# Patient Record
Sex: Female | Born: 1960 | Race: Black or African American | Hispanic: No | Smoking: Former smoker
Health system: Southern US, Community
[De-identification: ages and names within clinical notes are randomized; demographics above are authoritative.]

## PROBLEM LIST (undated history)

## (undated) DIAGNOSIS — C801 Malignant (primary) neoplasm, unspecified: Secondary | ICD-10-CM

## (undated) DIAGNOSIS — E785 Hyperlipidemia, unspecified: Secondary | ICD-10-CM

## (undated) DIAGNOSIS — G589 Mononeuropathy, unspecified: Secondary | ICD-10-CM

## (undated) DIAGNOSIS — F32A Depression, unspecified: Secondary | ICD-10-CM

## (undated) DIAGNOSIS — K5900 Constipation, unspecified: Secondary | ICD-10-CM

## (undated) DIAGNOSIS — F329 Major depressive disorder, single episode, unspecified: Secondary | ICD-10-CM

## (undated) DIAGNOSIS — M542 Cervicalgia: Secondary | ICD-10-CM

## (undated) DIAGNOSIS — M1990 Unspecified osteoarthritis, unspecified site: Secondary | ICD-10-CM

## (undated) DIAGNOSIS — I10 Essential (primary) hypertension: Secondary | ICD-10-CM

## (undated) DIAGNOSIS — K219 Gastro-esophageal reflux disease without esophagitis: Secondary | ICD-10-CM

## (undated) DIAGNOSIS — J329 Chronic sinusitis, unspecified: Secondary | ICD-10-CM

## (undated) DIAGNOSIS — N951 Menopausal and female climacteric states: Secondary | ICD-10-CM

## (undated) DIAGNOSIS — F419 Anxiety disorder, unspecified: Secondary | ICD-10-CM

## (undated) DIAGNOSIS — Z87442 Personal history of urinary calculi: Secondary | ICD-10-CM

## (undated) HISTORY — PX: REPAIR IMPERFORATE ANUS / ANORECTOPLASTY: SUR1185

## (undated) HISTORY — DX: Constipation, unspecified: K59.00

## (undated) HISTORY — PX: EYE SURGERY: SHX253

## (undated) HISTORY — DX: Malignant (primary) neoplasm, unspecified: C80.1

## (undated) HISTORY — PX: BREAST BIOPSY: SHX20

## (undated) HISTORY — DX: Essential (primary) hypertension: I10

## (undated) HISTORY — DX: Unspecified osteoarthritis, unspecified site: M19.90

## (undated) HISTORY — PX: ABDOMINAL EXPLORATION SURGERY: SHX538

## (undated) HISTORY — DX: Cervicalgia: M54.2

## (undated) HISTORY — DX: Gastro-esophageal reflux disease without esophagitis: K21.9

## (undated) HISTORY — DX: Hyperlipidemia, unspecified: E78.5

---
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
## 1989-11-07 HISTORY — PX: TUBAL LIGATION: SHX77

## 1991-11-08 DIAGNOSIS — C801 Malignant (primary) neoplasm, unspecified: Secondary | ICD-10-CM

## 1991-11-08 HISTORY — DX: Malignant (primary) neoplasm, unspecified: C80.1

## 2001-03-15 ENCOUNTER — Other Ambulatory Visit: Admission: RE | Admit: 2001-03-15 | Discharge: 2001-03-15 | Payer: Self-pay | Admitting: Family Medicine

## 2001-06-05 ENCOUNTER — Encounter: Payer: Self-pay | Admitting: *Deleted

## 2001-06-05 ENCOUNTER — Emergency Department (HOSPITAL_COMMUNITY): Admission: EM | Admit: 2001-06-05 | Discharge: 2001-06-05 | Payer: Self-pay | Admitting: *Deleted

## 2002-06-28 ENCOUNTER — Ambulatory Visit (HOSPITAL_COMMUNITY): Admission: RE | Admit: 2002-06-28 | Discharge: 2002-06-28 | Payer: Self-pay | Admitting: Family Medicine

## 2002-06-28 ENCOUNTER — Encounter: Payer: Self-pay | Admitting: Family Medicine

## 2002-11-07 HISTORY — PX: BREAST SURGERY: SHX581

## 2002-11-07 HISTORY — PX: BREAST CYST EXCISION: SHX579

## 2003-02-19 ENCOUNTER — Ambulatory Visit (HOSPITAL_COMMUNITY): Admission: RE | Admit: 2003-02-19 | Discharge: 2003-02-19 | Payer: Self-pay | Admitting: General Surgery

## 2003-04-23 ENCOUNTER — Ambulatory Visit (HOSPITAL_COMMUNITY): Admission: RE | Admit: 2003-04-23 | Discharge: 2003-04-23 | Payer: Self-pay | Admitting: Family Medicine

## 2003-04-23 ENCOUNTER — Encounter: Payer: Self-pay | Admitting: Family Medicine

## 2003-05-08 ENCOUNTER — Encounter: Payer: Self-pay | Admitting: Family Medicine

## 2003-05-08 ENCOUNTER — Ambulatory Visit (HOSPITAL_COMMUNITY): Admission: RE | Admit: 2003-05-08 | Discharge: 2003-05-08 | Payer: Self-pay | Admitting: Family Medicine

## 2003-05-23 ENCOUNTER — Ambulatory Visit (HOSPITAL_COMMUNITY): Admission: RE | Admit: 2003-05-23 | Discharge: 2003-05-23 | Payer: Self-pay | Admitting: General Surgery

## 2003-05-23 ENCOUNTER — Encounter: Payer: Self-pay | Admitting: General Surgery

## 2003-10-22 ENCOUNTER — Ambulatory Visit (HOSPITAL_COMMUNITY): Admission: RE | Admit: 2003-10-22 | Discharge: 2003-10-22 | Payer: Self-pay | Admitting: Family Medicine

## 2003-11-08 HISTORY — PX: OTHER SURGICAL HISTORY: SHX169

## 2004-01-01 ENCOUNTER — Ambulatory Visit (HOSPITAL_COMMUNITY): Admission: RE | Admit: 2004-01-01 | Discharge: 2004-01-01 | Payer: Self-pay | Admitting: Family Medicine

## 2004-05-06 ENCOUNTER — Ambulatory Visit (HOSPITAL_COMMUNITY): Admission: RE | Admit: 2004-05-06 | Discharge: 2004-05-06 | Payer: Self-pay | Admitting: Family Medicine

## 2004-07-08 ENCOUNTER — Emergency Department (HOSPITAL_COMMUNITY): Admission: EM | Admit: 2004-07-08 | Discharge: 2004-07-09 | Payer: Self-pay | Admitting: *Deleted

## 2004-12-15 ENCOUNTER — Ambulatory Visit: Payer: Self-pay | Admitting: Family Medicine

## 2005-03-16 ENCOUNTER — Ambulatory Visit: Payer: Self-pay | Admitting: Family Medicine

## 2005-04-15 ENCOUNTER — Ambulatory Visit: Payer: Self-pay | Admitting: Family Medicine

## 2005-05-26 ENCOUNTER — Emergency Department (HOSPITAL_COMMUNITY): Admission: EM | Admit: 2005-05-26 | Discharge: 2005-05-26 | Payer: Self-pay | Admitting: Emergency Medicine

## 2005-05-26 ENCOUNTER — Ambulatory Visit: Payer: Self-pay | Admitting: Family Medicine

## 2005-06-06 ENCOUNTER — Encounter (INDEPENDENT_AMBULATORY_CARE_PROVIDER_SITE_OTHER): Payer: Self-pay | Admitting: *Deleted

## 2005-06-06 ENCOUNTER — Ambulatory Visit: Payer: Self-pay | Admitting: Family Medicine

## 2005-06-06 LAB — CONVERTED CEMR LAB: Pap Smear: NORMAL

## 2005-06-15 ENCOUNTER — Ambulatory Visit (HOSPITAL_COMMUNITY): Admission: RE | Admit: 2005-06-15 | Discharge: 2005-06-15 | Payer: Self-pay | Admitting: Family Medicine

## 2005-06-27 ENCOUNTER — Ambulatory Visit: Payer: Self-pay | Admitting: Family Medicine

## 2005-06-27 ENCOUNTER — Encounter: Payer: Self-pay | Admitting: Orthopedic Surgery

## 2005-06-28 ENCOUNTER — Ambulatory Visit (HOSPITAL_COMMUNITY): Admission: RE | Admit: 2005-06-28 | Discharge: 2005-06-28 | Payer: Self-pay | Admitting: Family Medicine

## 2005-06-29 ENCOUNTER — Ambulatory Visit (HOSPITAL_COMMUNITY): Admission: RE | Admit: 2005-06-29 | Discharge: 2005-06-29 | Payer: Self-pay | Admitting: Family Medicine

## 2005-07-18 ENCOUNTER — Ambulatory Visit (HOSPITAL_COMMUNITY): Admission: RE | Admit: 2005-07-18 | Discharge: 2005-07-18 | Payer: Self-pay | Admitting: Family Medicine

## 2005-07-25 ENCOUNTER — Ambulatory Visit: Payer: Self-pay | Admitting: Family Medicine

## 2005-08-10 ENCOUNTER — Ambulatory Visit: Payer: Self-pay | Admitting: Family Medicine

## 2005-11-07 HISTORY — PX: ABDOMINAL HYSTERECTOMY: SHX81

## 2005-11-07 HISTORY — PX: TOTAL ABDOMINAL HYSTERECTOMY W/ BILATERAL SALPINGOOPHORECTOMY: SHX83

## 2005-12-14 ENCOUNTER — Ambulatory Visit: Payer: Self-pay | Admitting: Family Medicine

## 2006-01-17 ENCOUNTER — Emergency Department (HOSPITAL_COMMUNITY): Admission: EM | Admit: 2006-01-17 | Discharge: 2006-01-17 | Payer: Self-pay | Admitting: Emergency Medicine

## 2006-01-19 ENCOUNTER — Ambulatory Visit: Payer: Self-pay | Admitting: Family Medicine

## 2006-02-09 ENCOUNTER — Ambulatory Visit: Payer: Self-pay | Admitting: Family Medicine

## 2006-02-10 ENCOUNTER — Ambulatory Visit (HOSPITAL_COMMUNITY): Admission: RE | Admit: 2006-02-10 | Discharge: 2006-02-10 | Payer: Self-pay | Admitting: Family Medicine

## 2006-03-08 ENCOUNTER — Ambulatory Visit (HOSPITAL_COMMUNITY): Admission: RE | Admit: 2006-03-08 | Discharge: 2006-03-08 | Payer: Self-pay | Admitting: Family Medicine

## 2006-03-22 ENCOUNTER — Ambulatory Visit: Payer: Self-pay | Admitting: Family Medicine

## 2006-03-24 ENCOUNTER — Ambulatory Visit (HOSPITAL_COMMUNITY): Admission: RE | Admit: 2006-03-24 | Discharge: 2006-03-24 | Payer: Self-pay | Admitting: Family Medicine

## 2006-05-24 ENCOUNTER — Encounter (INDEPENDENT_AMBULATORY_CARE_PROVIDER_SITE_OTHER): Payer: Self-pay | Admitting: Specialist

## 2006-05-24 ENCOUNTER — Inpatient Hospital Stay (HOSPITAL_COMMUNITY): Admission: RE | Admit: 2006-05-24 | Discharge: 2006-05-27 | Payer: Self-pay | Admitting: Obstetrics & Gynecology

## 2006-06-06 ENCOUNTER — Ambulatory Visit: Payer: Self-pay | Admitting: Family Medicine

## 2006-07-31 ENCOUNTER — Emergency Department (HOSPITAL_COMMUNITY): Admission: EM | Admit: 2006-07-31 | Discharge: 2006-07-31 | Payer: Self-pay | Admitting: Emergency Medicine

## 2006-09-05 ENCOUNTER — Ambulatory Visit: Payer: Self-pay | Admitting: Family Medicine

## 2007-01-01 ENCOUNTER — Ambulatory Visit: Payer: Self-pay | Admitting: Family Medicine

## 2007-02-21 ENCOUNTER — Emergency Department (HOSPITAL_COMMUNITY): Admission: EM | Admit: 2007-02-21 | Discharge: 2007-02-21 | Payer: Self-pay | Admitting: Emergency Medicine

## 2007-09-01 ENCOUNTER — Emergency Department (HOSPITAL_COMMUNITY): Admission: EM | Admit: 2007-09-01 | Discharge: 2007-09-01 | Payer: Self-pay | Admitting: Emergency Medicine

## 2007-09-07 ENCOUNTER — Ambulatory Visit (HOSPITAL_COMMUNITY): Admission: RE | Admit: 2007-09-07 | Discharge: 2007-09-07 | Payer: Self-pay | Admitting: Family Medicine

## 2007-12-17 ENCOUNTER — Emergency Department (HOSPITAL_COMMUNITY): Admission: EM | Admit: 2007-12-17 | Discharge: 2007-12-17 | Payer: Self-pay | Admitting: Emergency Medicine

## 2008-01-08 ENCOUNTER — Ambulatory Visit: Payer: Self-pay | Admitting: Family Medicine

## 2008-01-29 ENCOUNTER — Encounter (INDEPENDENT_AMBULATORY_CARE_PROVIDER_SITE_OTHER): Payer: Self-pay | Admitting: *Deleted

## 2008-01-29 DIAGNOSIS — I10 Essential (primary) hypertension: Secondary | ICD-10-CM | POA: Insufficient documentation

## 2008-01-29 DIAGNOSIS — K219 Gastro-esophageal reflux disease without esophagitis: Secondary | ICD-10-CM

## 2008-01-29 DIAGNOSIS — H409 Unspecified glaucoma: Secondary | ICD-10-CM | POA: Insufficient documentation

## 2008-01-29 DIAGNOSIS — E785 Hyperlipidemia, unspecified: Secondary | ICD-10-CM | POA: Insufficient documentation

## 2008-02-11 ENCOUNTER — Encounter: Payer: Self-pay | Admitting: Family Medicine

## 2008-02-11 LAB — CONVERTED CEMR LAB
ALT: 12 units/L (ref 0–35)
AST: 14 units/L (ref 0–37)
Albumin: 4.8 g/dL (ref 3.5–5.2)
BUN: 13 mg/dL (ref 6–23)
Chloride: 102 meq/L (ref 96–112)
Creatinine, Ser: 0.8 mg/dL (ref 0.40–1.20)
Glucose, Bld: 78 mg/dL (ref 70–99)
HDL: 87 mg/dL (ref >39)
Potassium: 4.1 meq/L (ref 3.5–5.3)
Total CHOL/HDL Ratio: 2.3
Total Protein: 8.4 g/dL — ABNORMAL HIGH (ref 6.0–8.3)

## 2008-02-12 ENCOUNTER — Ambulatory Visit: Payer: Self-pay | Admitting: Family Medicine

## 2008-02-21 ENCOUNTER — Encounter: Payer: Self-pay | Admitting: Orthopedic Surgery

## 2008-02-22 ENCOUNTER — Encounter (INDEPENDENT_AMBULATORY_CARE_PROVIDER_SITE_OTHER): Payer: Self-pay | Admitting: *Deleted

## 2008-03-11 ENCOUNTER — Encounter: Payer: Self-pay | Admitting: Orthopedic Surgery

## 2008-03-17 ENCOUNTER — Ambulatory Visit: Payer: Self-pay | Admitting: Orthopedic Surgery

## 2008-03-17 DIAGNOSIS — S139XXA Sprain of joints and ligaments of unspecified parts of neck, initial encounter: Secondary | ICD-10-CM | POA: Insufficient documentation

## 2008-03-17 DIAGNOSIS — M542 Cervicalgia: Secondary | ICD-10-CM | POA: Insufficient documentation

## 2008-03-18 ENCOUNTER — Telehealth: Payer: Self-pay | Admitting: Orthopedic Surgery

## 2008-03-20 ENCOUNTER — Ambulatory Visit (HOSPITAL_COMMUNITY): Admission: RE | Admit: 2008-03-20 | Discharge: 2008-03-20 | Payer: Self-pay | Admitting: Orthopedic Surgery

## 2008-03-26 ENCOUNTER — Ambulatory Visit: Payer: Self-pay | Admitting: Orthopedic Surgery

## 2008-04-01 ENCOUNTER — Encounter (HOSPITAL_COMMUNITY): Admission: RE | Admit: 2008-04-01 | Discharge: 2008-05-01 | Payer: Self-pay | Admitting: Orthopedic Surgery

## 2008-04-02 ENCOUNTER — Telehealth: Payer: Self-pay | Admitting: Orthopedic Surgery

## 2008-04-02 ENCOUNTER — Encounter: Payer: Self-pay | Admitting: Orthopedic Surgery

## 2008-04-05 ENCOUNTER — Emergency Department (HOSPITAL_COMMUNITY): Admission: EM | Admit: 2008-04-05 | Discharge: 2008-04-05 | Payer: Self-pay | Admitting: Emergency Medicine

## 2008-04-07 ENCOUNTER — Encounter: Admission: RE | Admit: 2008-04-07 | Discharge: 2008-04-07 | Payer: Self-pay | Admitting: Orthopedic Surgery

## 2008-04-21 ENCOUNTER — Encounter: Payer: Self-pay | Admitting: Orthopedic Surgery

## 2008-04-23 ENCOUNTER — Encounter: Admission: RE | Admit: 2008-04-23 | Discharge: 2008-04-23 | Payer: Self-pay | Admitting: Orthopedic Surgery

## 2008-05-05 ENCOUNTER — Encounter (HOSPITAL_COMMUNITY): Admission: RE | Admit: 2008-05-05 | Discharge: 2008-06-04 | Payer: Self-pay | Admitting: Orthopedic Surgery

## 2008-05-07 ENCOUNTER — Ambulatory Visit: Payer: Self-pay | Admitting: Orthopedic Surgery

## 2008-05-07 DIAGNOSIS — M542 Cervicalgia: Secondary | ICD-10-CM

## 2008-05-07 HISTORY — DX: Cervicalgia: M54.2

## 2008-05-13 ENCOUNTER — Encounter: Payer: Self-pay | Admitting: Family Medicine

## 2008-05-13 ENCOUNTER — Ambulatory Visit: Payer: Self-pay | Admitting: Family Medicine

## 2008-05-13 DIAGNOSIS — M25559 Pain in unspecified hip: Secondary | ICD-10-CM

## 2008-05-14 DIAGNOSIS — J309 Allergic rhinitis, unspecified: Secondary | ICD-10-CM | POA: Insufficient documentation

## 2008-06-18 ENCOUNTER — Ambulatory Visit: Payer: Self-pay | Admitting: Orthopedic Surgery

## 2008-06-18 DIAGNOSIS — R599 Enlarged lymph nodes, unspecified: Secondary | ICD-10-CM | POA: Insufficient documentation

## 2008-06-24 ENCOUNTER — Encounter: Payer: Self-pay | Admitting: Orthopedic Surgery

## 2008-06-26 ENCOUNTER — Ambulatory Visit (HOSPITAL_COMMUNITY): Admission: RE | Admit: 2008-06-26 | Discharge: 2008-06-26 | Payer: Self-pay | Admitting: Orthopedic Surgery

## 2008-07-04 ENCOUNTER — Encounter: Payer: Self-pay | Admitting: Orthopedic Surgery

## 2008-07-09 ENCOUNTER — Ambulatory Visit: Payer: Self-pay | Admitting: Orthopedic Surgery

## 2008-07-10 ENCOUNTER — Encounter: Payer: Self-pay | Admitting: Family Medicine

## 2008-07-10 LAB — CONVERTED CEMR LAB
BUN: 13 mg/dL (ref 6–23)
Basophils Absolute: 0 10*3/uL (ref 0.0–0.1)
Basophils Relative: 0 % (ref 0–1)
CO2: 30 meq/L (ref 19–32)
Calcium: 9.6 mg/dL (ref 8.4–10.5)
Creatinine, Ser: 0.84 mg/dL (ref 0.40–1.20)
Eosinophils Relative: 1 % (ref 0–5)
HCT: 42.9 % (ref 36.0–46.0)
HDL: 93 mg/dL (ref >39)
Hemoglobin: 14.7 g/dL (ref 12.0–15.0)
MCHC: 34.3 g/dL (ref 30.0–36.0)
Monocytes Absolute: 0.8 10*3/uL (ref 0.1–1.0)
RDW: 13.2 % (ref 11.5–15.5)
TSH: 1.773 microintl units/mL (ref 0.350–4.50)
Triglycerides: 116 mg/dL (ref <150)

## 2008-07-21 ENCOUNTER — Encounter: Admission: RE | Admit: 2008-07-21 | Discharge: 2008-07-21 | Payer: Self-pay | Admitting: Orthopedic Surgery

## 2008-07-24 ENCOUNTER — Other Ambulatory Visit: Admission: RE | Admit: 2008-07-24 | Discharge: 2008-07-24 | Payer: Self-pay | Admitting: Family Medicine

## 2008-07-24 ENCOUNTER — Ambulatory Visit: Payer: Self-pay | Admitting: Family Medicine

## 2008-07-24 DIAGNOSIS — E049 Nontoxic goiter, unspecified: Secondary | ICD-10-CM | POA: Insufficient documentation

## 2008-07-24 LAB — CONVERTED CEMR LAB: Pap Smear: NORMAL

## 2008-07-29 ENCOUNTER — Ambulatory Visit (HOSPITAL_COMMUNITY): Admission: RE | Admit: 2008-07-29 | Discharge: 2008-07-29 | Payer: Self-pay | Admitting: Family Medicine

## 2008-08-01 ENCOUNTER — Encounter: Payer: Self-pay | Admitting: Family Medicine

## 2008-08-07 ENCOUNTER — Ambulatory Visit: Payer: Self-pay | Admitting: Family Medicine

## 2008-08-19 ENCOUNTER — Telehealth: Payer: Self-pay | Admitting: Family Medicine

## 2008-09-09 ENCOUNTER — Ambulatory Visit (HOSPITAL_COMMUNITY): Admission: RE | Admit: 2008-09-09 | Discharge: 2008-09-09 | Payer: Self-pay | Admitting: Family Medicine

## 2008-09-26 ENCOUNTER — Ambulatory Visit: Payer: Self-pay | Admitting: Family Medicine

## 2008-09-26 DIAGNOSIS — R51 Headache: Secondary | ICD-10-CM

## 2008-09-26 DIAGNOSIS — R519 Headache, unspecified: Secondary | ICD-10-CM | POA: Insufficient documentation

## 2008-09-26 DIAGNOSIS — R42 Dizziness and giddiness: Secondary | ICD-10-CM | POA: Insufficient documentation

## 2008-11-27 ENCOUNTER — Ambulatory Visit: Payer: Self-pay | Admitting: Family Medicine

## 2008-12-02 ENCOUNTER — Encounter: Payer: Self-pay | Admitting: Family Medicine

## 2008-12-02 LAB — CONVERTED CEMR LAB
ALT: 14 units/L (ref 0–35)
AST: 16 units/L (ref 0–37)
Albumin: 4.4 g/dL (ref 3.5–5.2)
HDL: 83 mg/dL (ref >39)
Total Bilirubin: 0.4 mg/dL (ref 0.3–1.2)
Total CHOL/HDL Ratio: 1.9
Total Protein: 7.9 g/dL (ref 6.0–8.3)
Triglycerides: 77 mg/dL (ref <150)

## 2009-01-09 ENCOUNTER — Telehealth: Payer: Self-pay | Admitting: Family Medicine

## 2009-03-09 ENCOUNTER — Ambulatory Visit: Payer: Self-pay | Admitting: Family Medicine

## 2009-03-09 DIAGNOSIS — M79609 Pain in unspecified limb: Secondary | ICD-10-CM

## 2009-04-14 ENCOUNTER — Ambulatory Visit (HOSPITAL_COMMUNITY): Admission: RE | Admit: 2009-04-14 | Discharge: 2009-04-14 | Payer: Self-pay | Admitting: Family Medicine

## 2009-06-03 ENCOUNTER — Encounter: Payer: Self-pay | Admitting: Family Medicine

## 2009-06-03 LAB — CONVERTED CEMR LAB
ALT: 20 units/L (ref 0–35)
AST: 20 units/L (ref 0–37)
Albumin: 4.2 g/dL (ref 3.5–5.2)
Alkaline Phosphatase: 54 units/L (ref 39–117)
CO2: 26 meq/L (ref 19–32)
Calcium: 9.1 mg/dL (ref 8.4–10.5)
Cholesterol: 167 mg/dL (ref 0–200)
HDL: 72 mg/dL (ref >39)
Sodium: 141 meq/L (ref 135–145)
Total CHOL/HDL Ratio: 2.3
Total Protein: 7.3 g/dL (ref 6.0–8.3)
Triglycerides: 72 mg/dL (ref <150)

## 2009-07-02 ENCOUNTER — Ambulatory Visit: Payer: Self-pay | Admitting: Family Medicine

## 2009-08-18 ENCOUNTER — Ambulatory Visit: Payer: Self-pay | Admitting: Family Medicine

## 2009-08-25 ENCOUNTER — Telehealth: Payer: Self-pay | Admitting: Family Medicine

## 2009-09-03 ENCOUNTER — Encounter: Payer: Self-pay | Admitting: Family Medicine

## 2009-09-03 LAB — CONVERTED CEMR LAB: OCCULT 1: NEGATIVE

## 2009-09-05 ENCOUNTER — Encounter: Payer: Self-pay | Admitting: Family Medicine

## 2009-09-06 ENCOUNTER — Encounter: Payer: Self-pay | Admitting: Family Medicine

## 2009-09-07 ENCOUNTER — Encounter: Payer: Self-pay | Admitting: Family Medicine

## 2009-09-07 LAB — CONVERTED CEMR LAB: OCCULT 1: NEGATIVE

## 2009-10-02 ENCOUNTER — Ambulatory Visit (HOSPITAL_COMMUNITY): Admission: RE | Admit: 2009-10-02 | Discharge: 2009-10-02 | Payer: Self-pay | Admitting: Family Medicine

## 2009-10-05 ENCOUNTER — Ambulatory Visit: Payer: Self-pay | Admitting: Family Medicine

## 2009-10-14 ENCOUNTER — Telehealth: Payer: Self-pay | Admitting: Family Medicine

## 2009-11-25 ENCOUNTER — Telehealth: Payer: Self-pay | Admitting: Family Medicine

## 2009-12-15 LAB — CONVERTED CEMR LAB
ALT: 12 units/L (ref 0–35)
BUN: 12 mg/dL (ref 6–23)
Bilirubin, Direct: 0.1 mg/dL (ref 0.0–0.3)
Chloride: 104 meq/L (ref 96–112)
Glucose, Bld: 81 mg/dL (ref 70–99)
Indirect Bilirubin: 0.3 mg/dL (ref 0.0–0.9)
LDL Cholesterol: 75 mg/dL (ref 0–99)
Potassium: 4.1 meq/L (ref 3.5–5.3)
Sodium: 140 meq/L (ref 135–145)
Total CHOL/HDL Ratio: 2.5
Total Protein: 7.3 g/dL (ref 6.0–8.3)
VLDL: 20 mg/dL (ref 0–40)

## 2009-12-29 ENCOUNTER — Ambulatory Visit: Payer: Self-pay | Admitting: Family Medicine

## 2009-12-29 DIAGNOSIS — R5381 Other malaise: Secondary | ICD-10-CM

## 2009-12-29 DIAGNOSIS — R5383 Other fatigue: Secondary | ICD-10-CM

## 2009-12-29 DIAGNOSIS — M5442 Lumbago with sciatica, left side: Secondary | ICD-10-CM

## 2010-01-11 LAB — CONVERTED CEMR LAB
Basophils Relative: 0 % (ref 0–1)
Hemoglobin: 14.1 g/dL (ref 12.0–15.0)
Lymphs Abs: 2.2 10*3/uL (ref 0.7–4.0)
MCHC: 34.4 g/dL (ref 30.0–36.0)
MCV: 89.3 fL (ref 78.0–100.0)
Monocytes Absolute: 0.7 10*3/uL (ref 0.1–1.0)
Monocytes Relative: 8 % (ref 3–12)
Neutro Abs: 6.3 10*3/uL (ref 1.7–7.7)
Neutrophils Relative %: 67 % (ref 43–77)
RBC: 4.59 M/uL (ref 3.87–5.11)
TSH: 1.629 microintl units/mL (ref 0.350–4.500)
WBC: 9.4 10*3/uL (ref 4.0–10.5)

## 2010-01-12 ENCOUNTER — Telehealth: Payer: Self-pay | Admitting: Family Medicine

## 2010-02-03 ENCOUNTER — Ambulatory Visit: Payer: Self-pay | Admitting: Family Medicine

## 2010-02-04 ENCOUNTER — Telehealth: Payer: Self-pay | Admitting: Family Medicine

## 2010-02-09 ENCOUNTER — Ambulatory Visit (HOSPITAL_COMMUNITY): Admission: RE | Admit: 2010-02-09 | Discharge: 2010-02-09 | Payer: Self-pay | Admitting: Family Medicine

## 2010-06-08 ENCOUNTER — Ambulatory Visit: Payer: Self-pay | Admitting: Family Medicine

## 2010-06-08 DIAGNOSIS — L259 Unspecified contact dermatitis, unspecified cause: Secondary | ICD-10-CM | POA: Insufficient documentation

## 2010-06-16 ENCOUNTER — Telehealth: Payer: Self-pay | Admitting: Family Medicine

## 2010-06-18 ENCOUNTER — Telehealth: Payer: Self-pay | Admitting: Family Medicine

## 2010-07-27 ENCOUNTER — Other Ambulatory Visit: Admission: RE | Admit: 2010-07-27 | Discharge: 2010-07-27 | Payer: Self-pay | Admitting: Family Medicine

## 2010-07-27 ENCOUNTER — Ambulatory Visit: Payer: Self-pay | Admitting: Family Medicine

## 2010-07-27 DIAGNOSIS — N63 Unspecified lump in unspecified breast: Secondary | ICD-10-CM

## 2010-07-29 ENCOUNTER — Encounter: Payer: Self-pay | Admitting: Family Medicine

## 2010-07-30 ENCOUNTER — Encounter: Payer: Self-pay | Admitting: Family Medicine

## 2010-07-30 LAB — CONVERTED CEMR LAB: Pap Smear: NEGATIVE

## 2010-08-04 ENCOUNTER — Ambulatory Visit (HOSPITAL_COMMUNITY): Admission: RE | Admit: 2010-08-04 | Discharge: 2010-08-04 | Payer: Self-pay | Admitting: Family Medicine

## 2010-08-09 ENCOUNTER — Encounter: Payer: Self-pay | Admitting: Family Medicine

## 2010-08-09 LAB — CONVERTED CEMR LAB
ALT: 11 units/L (ref 0–35)
AST: 17 units/L (ref 0–37)
Albumin: 4.5 g/dL (ref 3.5–5.2)
Bilirubin, Direct: 0.1 mg/dL (ref 0.0–0.3)
Calcium: 9.5 mg/dL (ref 8.4–10.5)
Cholesterol: 170 mg/dL (ref 0–200)
HDL: 69 mg/dL (ref >39)
Potassium: 3.6 meq/L (ref 3.5–5.3)
Sodium: 141 meq/L (ref 135–145)
Total CHOL/HDL Ratio: 2.5
Total Protein: 7.3 g/dL (ref 6.0–8.3)
Triglycerides: 59 mg/dL (ref <150)
VLDL: 12 mg/dL (ref 0–40)

## 2010-09-27 ENCOUNTER — Ambulatory Visit: Payer: Self-pay | Admitting: Family Medicine

## 2010-10-04 ENCOUNTER — Ambulatory Visit (HOSPITAL_COMMUNITY): Admission: RE | Admit: 2010-10-04 | Payer: Self-pay | Admitting: Family Medicine

## 2010-11-26 ENCOUNTER — Telehealth: Payer: Self-pay | Admitting: Family Medicine

## 2010-11-27 ENCOUNTER — Encounter: Payer: Self-pay | Admitting: Family Medicine

## 2010-11-28 ENCOUNTER — Encounter: Payer: Self-pay | Admitting: Otolaryngology

## 2010-11-28 ENCOUNTER — Encounter: Payer: Self-pay | Admitting: Family Medicine

## 2010-11-29 ENCOUNTER — Encounter: Payer: Self-pay | Admitting: Orthopedic Surgery

## 2010-11-29 ENCOUNTER — Encounter: Payer: Self-pay | Admitting: Family Medicine

## 2010-12-07 NOTE — Assessment & Plan Note (Signed)
Summary: office visit   Vital Signs:  Patient profile:   50 year old female Menstrual status:  hysterectomy Height:      61.5 inches Weight:      127.50 pounds BMI:     23.79 O2 Sat:      94 % Pulse rate:   83 / minute Pulse rhythm:   regular Resp:     16 per minute BP sitting:   140 / 94  (left arm)  Vitals Entered By: Everitt Amber LPN (June 08, 2010 3:59 PM) CC: 2 days ago she noticed some bumps on her right arm.  Maybe 5 or so and they itch too. Nowhere else on her body. Also the inside of the right knee hurts her off and on   Primary Care Provider:  Syliva Overman MD  CC:  2 days ago she noticed some bumps on her right arm.  Maybe 5 or so and they itch too. Nowhere else on her body. Also the inside of the right knee hurts her off and on.  History of Present Illness: Reports  that she is doing fairly well. Denies sinus pressure, nasal congestion , ear pain or sore throat. Denies chest congestion, or cough productive of sputum. Denies chest pain, palpitations, PND, orthopnea or leg swelling. Denies abdominal pain, nausea, vomitting, diarrhea or constipation. Denies change in bowel movements or bloody stool. Denies dysuria , frequency, incontinence or hesitancy. Denies  joint pain, swelling, or reduced mobility. Denies headaches, vertigo, seizures. Denies depression, anxiety or insomnia.   Current Medications (verified): 1)  Prevacid 30 Mg  Cpdr (Lansoprazole) .... One Tab By Mouth Once Daily 2)  Hydrochlorothiazide 25 Mg  Tabs (Hydrochlorothiazide) .... One Tab By Mouth Once Daily 3)  Klor-Con 20 Meq  Pack (Potassium Chloride) .... One Tab By Mouth Once Daily 4)  Proair Hfa 108 (90 Base) Mcg/act Aers (Albuterol Sulfate) .... Use 2 Puffs Every 4-6 Hrs As Needed 5)  Nasacort Aq 55 Mcg/act Aers (Triamcinolone Acetonide(Nasal)) .... 2 Puffs in Each Nostril Once Daily 6)  Crestor 10 Mg Tabs (Rosuvastatin Calcium) .... One Tab By Mouth Qhs 7)  Singulair 10 Mg Tabs  (Montelukast Sodium) .... Take 1 Tab By Mouth At Bedtime 8)  Ibuprofen 800 Mg Tabs (Ibuprofen) .... Take 1 Tablet By Mouth Three Times A Day 9)  Cyclobenzaprine Hcl 10 Mg Tabs (Cyclobenzaprine Hcl) .... Take 1 Tablet By Mouth Three Times A Day As Needed This Is Sedating , So Use At Bedtime One Only 10)  Lotensin 10 Mg Tabs (Benazepril Hcl) .... Take 1 Tablet By Mouth Once A Day 11)  Prednisone (Pak) 5 Mg Tabs (Prednisone) .... Uad 12)  Flonase 50 Mcg/act Susp (Fluticasone Propionate) .... 2 Puffs Per Nostril Daily  Allergies (verified): No Known Drug Allergies  Past History:  Past medical, surgical, family and social histories (including risk factors) reviewed, and no changes noted (except as noted below). Past medical, surgical, family and social histories (including risk factors) reviewed for relevance to current acute and chronic problems.  Past Medical History: Reviewed history from 07/24/2008 and no changes required. Current Problems:  GERD (ICD-530.81) GLAUCOMA (ICD-365.9) HYPERLIPIDEMIA (ICD-272.4) HYPERTENSION (ICD-401.9) neck pain with bulging disc Dx 07/09 receiving epidurals  Past Surgical History: Reviewed history from 03/17/2008 and no changes required. Tubal ligation (1991) Bilateral eye surgery Hysterectomy and BSO (2007) cyst removed left breast/benign  Family History: Reviewed history from 03/17/2008 and no changes required. Mother deceased - alcoholic Father deceased - lung cancer, glaucoma 4 sisters -  x1 breast cancer Family History of Arthritis  Social History: Reviewed history from 01/29/2008 and no changes required. unemployed 2011 Single 3 children Former Smoker Alcohol use-no Drug use-no  Review of Systems      See HPI General:  Complains of fatigue. Eyes:  Complains of vision loss-both eyes; denies discharge and red eye; glaucoma. MS:  Complains of joint pain and stiffness; chronic neck pain, improved since stopped working. Derm:  Complains  of itching and rash; 2 day h/o erythematous puritic rash on upper arms.  Physical Exam  General:  Well-developed,well-nourished,in no acute distress; alert,appropriate and cooperative throughout examination HEENT: No facial asymmetry, EOMI, No sinus tenderness, TM's Clear, oropharynx  pink and moist. Reduced ROM left neck   Chest: Clear to auscultation bilaterally.  CVS: S1, S2, No murmurs, No S3.   Abd: Soft, Nontender.  MS: Adequate ROM spine, hips, shoulders and knees.  Ext: No edema.   CNS: CN 2-12 intact, power tone and sensation normal throughout.   Skin: Intact,erythematous macular rash on forearms  Psych: Good eye contact, normal affect.  Memory intact, not anxious or depressed appearing.    Impression & Recommendations:  Problem # 1:  DERMATITIS (ICD-692.9) Assessment Comment Only  Her updated medication list for this problem includes:    Prednisone (pak) 5 Mg Tabs (Prednisone) ..... Uad    Prednisone (pak) 5 Mg Tabs (Prednisone) ..... Use as directed  Problem # 2:  HYPERTENSION (ICD-401.9) Assessment: Deteriorated  The following medications were removed from the medication list:    Hydrochlorothiazide 25 Mg Tabs (Hydrochlorothiazide) ..... One tab by mouth once daily    Lotensin 10 Mg Tabs (Benazepril hcl) .Marland Kitchen... Take 1 tablet by mouth once a day Her updated medication list for this problem includes:    Maxzide-25 37.5-25 Mg Tabs (Triamterene-hctz) ..... One half tablet once daily  BP today: 140/94 Prior BP: 110/80 (02/03/2010)  Labs Reviewed: K+: 4.1 (12/15/2009) Creat: : 0.81 (12/15/2009)   Chol: 160 (12/15/2009)   HDL: 65 (12/15/2009)   LDL: 75 (12/15/2009)   TG: 98 (12/15/2009)  Problem # 3:  HYPERLIPIDEMIA (ICD-272.4) Assessment: Comment Only  Her updated medication list for this problem includes:    Crestor 10 Mg Tabs (Rosuvastatin calcium) ..... One tab by mouth qhs  Labs Reviewed: SGOT: 16 (12/15/2009)   SGPT: 12 (12/15/2009)   HDL:65 (12/15/2009),  72 (84/16/6063)  LDL:75 (12/15/2009), 81 (06/03/2009)  Chol:160 (12/15/2009), 167 (06/03/2009)  Trig:98 (12/15/2009), 72 (06/03/2009)  Complete Medication List: 1)  Prevacid 30 Mg Cpdr (Lansoprazole) .... One tab by mouth once daily 2)  Proair Hfa 108 (90 Base) Mcg/act Aers (Albuterol sulfate) .... Use 2 puffs every 4-6 hrs as needed 3)  Nasacort Aq 55 Mcg/act Aers (Triamcinolone acetonide(nasal)) .... 2 puffs in each nostril once daily 4)  Crestor 10 Mg Tabs (Rosuvastatin calcium) .... One tab by mouth qhs 5)  Singulair 10 Mg Tabs (Montelukast sodium) .... Take 1 tab by mouth at bedtime 6)  Ibuprofen 800 Mg Tabs (Ibuprofen) .... Take 1 tablet by mouth three times a day 7)  Cyclobenzaprine Hcl 10 Mg Tabs (Cyclobenzaprine hcl) .... Take 1 tablet by mouth three times a day as needed this is sedating , so use at bedtime one only 8)  Prednisone (pak) 5 Mg Tabs (Prednisone) .... Uad 9)  Flonase 50 Mcg/act Susp (Fluticasone propionate) .... 2 puffs per nostril daily 10)  Maxzide-25 37.5-25 Mg Tabs (Triamterene-hctz) .... One half tablet once daily 11)  Prednisone (pak) 5 Mg Tabs (Prednisone) .... Use  as directed 12)  Venlafaxine Hcl 37.5 Mg Tabs (Venlafaxine hcl) .... Take 1 tablet by mouth once a day  Patient Instructions: 1)  CPE September 16 or after. 2)  Meds as discussed, . 3)  You are on new BP meds. 4)  Keep up the healthy eating. Prescriptions: VENLAFAXINE HCL 37.5 MG TABS (VENLAFAXINE HCL) Take 1 tablet by mouth once a day  #30 x 3   Entered and Authorized by:   Syliva Overman MD   Signed by:   Syliva Overman MD on 06/08/2010   Method used:   Printed then faxed to ...       [facility] (retail)       [address]       [place], [place]  [address]       Ph: [phone]       Fax: [phone]   RxID:   [id number] PREDNISONE (PAK) 5 MG TABS (PREDNISONE) Use as directed  #21 x 0   Entered and Authorized by:   Syliva Overman MD   Signed  by:   Syliva Overman MD on 06/08/2010   Method used:   Printed then faxed to ...       [facility] (retail)       [address]       [place], [place]  [address]       Ph: [phone]       Fax: [phone]   RxID:   [id number] MAXZIDE-25 37.5-25 MG TABS (TRIAMTERENE-HCTZ) one half tablet once daily  #45 x 0   Entered and Authorized by:   Syliva Overman MD   Signed by:   Syliva Overman MD on 06/08/2010   Method used:   Printed then faxed to ...       [facility] (retail)       [address]       [place], [place]  [address]       Ph: [phone]       Fax: [phone]   RxID:   [id number]

## 2010-12-07 NOTE — Assessment & Plan Note (Signed)
Summary: office visit   Vital Signs:  Patient profile:   50 year old female Menstrual status:  hysterectomy Height:      61.5 inches Weight:      131.75 pounds BMI:     24.58 O2 Sat:      98 % Pulse rate:   87 / minute Pulse rhythm:   regular Resp:     16 per minute BP sitting:   110 / 80  (right arm)  Vitals Entered By: Everitt Amber LPN (February 03, 2010 4:19 PM) CC: left side of neck has been hurting and also she has some allergies that's bothering her   Primary Care Provider:  Syliva Overman MD  CC:  left side of neck has been hurting and also she has some allergies that's bothering her.  History of Present Illness: The pt states that hse is currently experiencing uncontrolled allergy symptomsand neck pain. she has had no redcent neck trauma and this complaint is chroinic. She denies any recent fever or chills . She denies chest pain , palpitation, pND , orthopnea or leg swelling. She denies depression or anxiety. She denies dysuria or frequency.  She is concerned about a swelling on the right side of her neck and wants this checked  Allergies: No Known Drug Allergies  Review of Systems      See HPI Eyes:  Complains of vision loss-both eyes; pt has glaucoma and is being treted for this. MS:  Complains of joint pain; continues to experience left sided neck pain and is requesting relief.. Derm:  Denies itching and rash. Neuro:  Denies headaches and seizures. Psych:  Denies anxiety and depression. Endo:  Denies cold intolerance, excessive hunger, excessive thirst, excessive urination, heat intolerance, polyuria, and weight change. Heme:  Denies abnormal bruising and bleeding. Allergy:  Complains of hives or rash, seasonal allergies, and sneezing; c/o increased allergy symptoms since the past 2 to 3 weeks.  Physical Exam  General:  Well-developed,well-nourished,in no acute distress; alert,appropriate and cooperative throughout examination HEENT: No facial asymmetry,  Goiter. EOMI, No sinus tenderness, TM's Clear, oropharynx  pink and moist. Reduced ROM left neck with spasm  Chest: Clear to auscultation bilaterally.  CVS: S1, S2, No murmurs, No S3.   Abd: Soft, Nontender.  MS: Adequate ROM spine, hips, shoulders and knees.  Ext: No edema.   CNS: CN 2-12 intact, power tone and sensation normal throughout.   Skin: Intact, no visible lesions or rashes.  Psych: Good eye contact, normal affect.  Memory intact, not anxious or depressed appearing.    Impression & Recommendations:  Problem # 1:  CERVICAL SPASM (ICD-847.0) Assessment Unchanged  Her updated medication list for this problem includes:    Ibuprofen 800 Mg Tabs (Ibuprofen) .Marland Kitchen... Take 1 tablet by mouth three times a day    Cyclobenzaprine Hcl 10 Mg Tabs (Cyclobenzaprine hcl) .Marland Kitchen... Take 1 tablet by mouth three times a day as needed this is sedating , so use at bedtime one onlypt recently had srteroids will not rept at this time  Problem # 2:  HYPERLIPIDEMIA (ICD-272.4) Assessment: Improved  Her updated medication list for this problem includes:    Crestor 10 Mg Tabs (Rosuvastatin calcium) ..... One tab by mouth qhs  Labs Reviewed: SGOT: 16 (12/15/2009)   SGPT: 12 (12/15/2009)   HDL:65 (12/15/2009), 72 (06/03/2009)  LDL:75 (12/15/2009), 81 (06/03/2009)  Chol:160 (12/15/2009), 167 (06/03/2009)  Trig:98 (12/15/2009), 72 (06/03/2009)  Problem # 3:  HYPERTENSION (ICD-401.9) Assessment: Improved  Her updated medication list for  this problem includes:    Hydrochlorothiazide 25 Mg Tabs (Hydrochlorothiazide) ..... One tab by mouth once daily    Lotensin 10 Mg Tabs (Benazepril hcl) .Marland Kitchen... Take 1 tablet by mouth once a day  BP today: 110/80 Prior BP: 130/90 (12/29/2009)  Labs Reviewed: K+: 4.1 (12/15/2009) Creat: : 0.81 (12/15/2009)   Chol: 160 (12/15/2009)   HDL: 65 (12/15/2009)   LDL: 75 (12/15/2009)   TG: 98 (12/15/2009)  Problem # 4:  GOITER, UNSPECIFIED (ICD-240.9) Assessment: Comment  Only  Future Orders: Radiology Referral (Radiology) ... 02/04/2010  Problem # 5:  ALLERGIC RHINITIS, SEASONAL (ICD-477.0) Assessment: Deteriorated flonase prescribed  Complete Medication List: 1)  Prevacid 30 Mg Cpdr (Lansoprazole) .... One tab by mouth once daily 2)  Hydrochlorothiazide 25 Mg Tabs (Hydrochlorothiazide) .... One tab by mouth once daily 3)  Klor-con 20 Meq Pack (Potassium chloride) .... One tab by mouth once daily 4)  Proair Hfa 108 (90 Base) Mcg/act Aers (Albuterol sulfate) .... Use 2 puffs every 4-6 hrs as needed 5)  Nasacort Aq 55 Mcg/act Aers (Triamcinolone acetonide(nasal)) .... 2 puffs in each nostril once daily 6)  Crestor 10 Mg Tabs (Rosuvastatin calcium) .... One tab by mouth qhs 7)  Singulair 10 Mg Tabs (Montelukast sodium) .... Take 1 tab by mouth at bedtime 8)  Ibuprofen 800 Mg Tabs (Ibuprofen) .... Take 1 tablet by mouth three times a day 9)  Cyclobenzaprine Hcl 10 Mg Tabs (Cyclobenzaprine hcl) .... Take 1 tablet by mouth three times a day as needed this is sedating , so use at bedtime one only 10)  Lotensin 10 Mg Tabs (Benazepril hcl) .... Take 1 tablet by mouth once a day 11)  Prednisone (pak) 5 Mg Tabs (Prednisone) .... Uad 12)  Flonase 50 Mcg/act Susp (Fluticasone propionate) .... 2 puffs per nostril daily  Patient Instructions: 1)  Please schedule a follow-up appointment in 4 months. 2)  Med is sent in fopr your allergies. 3)  Your labs are excellent. 4)  I am referring you for an ultraound of your neck Prescriptions: FLONASE 50 MCG/ACT SUSP (FLUTICASONE PROPIONATE) 2 puffs per nostril daily  #1 x 3   Entered and Authorized by:   Syliva Overman MD   Signed by:   Syliva Overman MD on 02/03/2010   Method used:   Electronically to        [facility] (retail)       [address]       [place], [place]  [address]       Ph: [phone]       Fax: [phone]   RxID:   [id number]

## 2010-12-07 NOTE — Miscellaneous (Signed)
Summary: Orders Update  Clinical Lists Changes  Orders: Added new Test order of T-Basic Metabolic Panel ([id number]) - Signed Added new Test order of T-Hepatic Function ([id number]) - Signed Added new Test order of T-Lipid Profile ([id number]) - Signed  

## 2010-12-07 NOTE — Assessment & Plan Note (Signed)
Summary: office visit   Vital Signs:  Patient profile:   50 year old female Menstrual status:  hysterectomy Height:      61.5 inches Weight:      133 pounds BMI:     24.81 O2 Sat:      97 % Pulse rate:   82 / minute Pulse rhythm:   regular Resp:     16 per minute BP sitting:   130 / 90 Cuff size:   regular  Vitals Entered By: Everitt Amber LPN (December 29, 2009 3:16 PM) CC: having pain in left hip and down through her thigh, been going on about 3 days Pain Assessment Patient in pain? yes     Location: left hip Intensity: 6 Type: aching Onset of pain  3 days ago, pain level varies   Primary Care Provider:  Syliva Overman MD  CC:  having pain in left hip and down through her thigh and been going on about 3 days.  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  Denies recent fever or chills. Denies sinus pressure, nasal congestion , ear pain or sore throat. Denies chest congestion, or cough productive of sputum. Denies chest pain, palpitations, PND, orthopnea or leg swelling. Denies abdominal pain, nausea, vomitting, diarrhea or constipation. Denies change in bowel movements or bloody stool. Denies dysuria , frequency, incontinence or hesitancy.  Denies depression, anxiety or insomnia. Denies  rash, lesions, or itch.     Current Medications (verified): 1)  Prevacid 30 Mg  Cpdr (Lansoprazole) .... One Tab By Mouth Once Daily 2)  Hydrochlorothiazide 25 Mg  Tabs (Hydrochlorothiazide) .... One Tab By Mouth Once Daily 3)  Klor-Con 20 Meq  Pack (Potassium Chloride) .... One Tab By Mouth Once Daily 4)  Proair Hfa 108 (90 Base) Mcg/act Aers (Albuterol Sulfate) .... Use 2 Puffs Every 4-6 Hrs As Needed 5)  Nasacort Aq 55 Mcg/act Aers (Triamcinolone Acetonide(Nasal)) .... 2 Puffs in Each Nostril  Once Daily 6)  Crestor 10 Mg Tabs (Rosuvastatin Calcium) .... One Tab By Mouth Qhs 7)  Singulair 10 Mg Tabs (Montelukast Sodium) .... Take 1 Tab By Mouth At Bedtime 8)  Ibuprofen 800 Mg Tabs (Ibuprofen) .... Take 1 Tablet By Mouth Three Times A Day 9)  Cyclobenzaprine Hcl 10 Mg Tabs (Cyclobenzaprine Hcl) .... Take 1 Tablet By Mouth Three Times A Day As Needed This Is Sedating , So Use At Bedtime One Only  Allergies (verified): No Known Drug Allergies  Review of Systems      See HPI Eyes:  Denies blurring and discharge. MS:  Complains of low back pain and mid back pain. Endo:  Denies cold intolerance, excessive hunger, excessive thirst, excessive urination, heat intolerance, polyuria, and weight change. Heme:  Denies abnormal bruising and bleeding. Allergy:  Denies hives or rash and itching eyes.  Physical Exam  General:  Well-developed,well-nourished,in no acute distress; alert,appropriate and cooperative throughout examination HEENT: No facial asymmetry,  EOMI, No sinus tenderness, TM's Clear, oropharynx  pink and moist.   Chest: Clear to auscultation bilaterally.  CVS: S1, S2, No murmurs, No S3.   Abd: Soft, Nontender.  MS: decreased ROM spine, adequate in  hips, shoulders and knees.  Ext: No edema.   CNS: CN 2-12 intact, power tone and sensation normal throughout.   Skin: Intact, no visible lesions  or rashes.  Psych: Good eye contact, normal affect.  Memory intact, not anxious or depressed appearing.    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 One tab by mouth once daily    Lotensin 10 Mg  Tabs (Benazepril hcl) .Marland Kitchen... Take 1 tablet by mouth once a day  Orders: T-Basic Metabolic Panel [id number])  BP today: 130/90 Prior BP: 130/80 (10/05/2009)  Labs Reviewed: K+: 4.1 (12/15/2009) Creat: : 0.81 (12/15/2009)   Chol: 160 (12/15/2009)   HDL: 65 (12/15/2009)   LDL: 75 (12/15/2009)   TG: 98 (12/15/2009)  Problem # 3:  GERD (ICD-530.81) Assessment: Unchanged  Her updated medication list for this problem includes:    Prevacid 30 Mg Cpdr (Lansoprazole) ..... One tab by mouth once daily  Problem # 4:  GLAUCOMA (ICD-365.9) Assessment: Comment Only pt closely followed by opthalmolgy  Problem # 5:  HYPERLIPIDEMIA (ICD-272.4) Assessment: Unchanged  Her updated medication list for this problem includes:    Crestor 10 Mg Tabs (Rosuvastatin calcium) ..... One tab by mouth qhs  Labs Reviewed: SGOT: 16 (12/15/2009)   SGPT: 12 (12/15/2009)   HDL:65 (12/15/2009), 72 (06/03/2009)  LDL:75 (12/15/2009), 81 (06/03/2009)  Chol:160 (12/15/2009), 167 (06/03/2009)  Trig:98 (12/15/2009), 72 (06/03/2009)  Complete Medication List: 1)  Prevacid 30 Mg Cpdr (Lansoprazole) .... One tab by mouth once daily 2)  Hydrochlorothiazide 25 Mg Tabs (Hydrochlorothiazide) .... One tab by mouth once daily 3)  Klor-con 20 Meq Pack (Potassium chloride) .... One tab by mouth once daily 4)  Proair Hfa 108 (90 Base) Mcg/act Aers (Albuterol sulfate) .... Use 2 puffs every 4-6 hrs as needed 5)  Nasacort Aq 55 Mcg/act Aers (Triamcinolone acetonide(nasal)) .... 2 puffs in each nostril once daily 6)  Crestor 10 Mg Tabs (Rosuvastatin calcium) .... One tab by mouth qhs 7)  Singulair 10 Mg Tabs (Montelukast sodium) .... Take 1 tab by mouth at bedtime 8)  Ibuprofen 800 Mg Tabs (Ibuprofen) .... Take 1 tablet by mouth three times a day 9)  Cyclobenzaprine Hcl 10 Mg Tabs (Cyclobenzaprine hcl) .... Take 1 tablet by mouth three times a day as needed this is sedating , so use at bedtime one only 10)  Prednisone (pak) 5 Mg  Tabs (Prednisone) .... Use as directed 11)  Lotensin 10 Mg Tabs (Benazepril hcl) .... Take 1 tablet by mouth once a day  Other Orders: T-CBC w/Diff ([id number]) T-TSH ([id number])  Patient Instructions: 1)  CPE and pap in 2 months. 2)  You are being treated for back pain with sciatica, meds are sent in and you will get injections in the office 3)  Your blood pressure is elevated you will start an additional medication for this continue the others that you are currrently taking Prescriptions: IBUPROFEN 800 MG TABS (IBUPROFEN) Take 1 tablet by mouth three times a day  #30 x 0   Entered by:   Everitt Amber LPN   Authorized by:   Syliva Overman MD   Signed by:   Everitt Amber LPN on 10/93/2355   Method used:   Electronically to        [facility] (retail)       [address]  [place], [place]  [address]       Ph: [phone]       Fax: [phone]   RxID:   [id number] LOTENSIN 10 MG TABS (BENAZEPRIL HCL) Take 1 tablet by mouth once a day  #30 x 2   Entered and Authorized by:   Syliva Overman MD   Signed by:   Syliva Overman MD on 12/29/2009   Method used:   Electronically to        [facility] (retail)       [address] [place], [place]  [address]       Ph: [phone]       Fax: [phone]   RxID:   [id number] PREDNISONE (PAK) 5 MG TABS (PREDNISONE) Use as directed  #21 x 0   Entered and Authorized by:   Syliva Overman MD   Signed by:   Syliva Overman MD on 12/29/2009   Method used:   Electronically to        [facility] (retail)       [address] [place], [place]  [address]       Ph: [phone]       Fax: [phone]   RxID:   [id number] CYCLOBENZAPRINE HCL 10 MG TABS (CYCLOBENZAPRINE HCL) Take 1 tablet by mouth three times a day as needed this is sedating , so use at bedtime one only  #30 x 1   Entered by:   Everitt Amber  LPN   Authorized by:   Syliva Overman MD   Signed by:   Everitt Amber LPN on 67/89/3810   Method used:   Electronically to        [facility] (retail)       [address]       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.2)    Route: IM    Site: LUOQ gluteus    Exp Date: 09/2010    Lot #: [id number]    Mfr: [email or web address]    Comments: 80 mg given     Patient tolerated injection without complications    Given by: Everitt Amber LPN (December 29, 2009 4:22 PM)  Injection # 2:    Medication: Ketorolac-Toradol 15mg     Diagnosis: BACK PAIN WITH RADICULOPATHY (ICD-729.2)    Route: IM    Site: LUOQ gluteus    Exp Date: 06/2011    Lot #: [id number]    Mfr: [email or web address]    Comments: 60 mg given     Patient tolerated injection without complications    Given by: Everitt Amber LPN (December 29, 2009 4:23 PM)  Orders Added: 1)  T-Basic Metabolic Panel [phone] 2)  Est. Patient Level IV [33825] 3)  T-CBC w/Diff [[id number]] 4)  T-TSH [[id number]] 5)  Ketorolac-Toradol 15mg  [J1885] 6)  Depo- Medrol 80mg  [J1040] 7)  Admin of Therapeutic Inj  intramuscular or subcutaneous [35329]

## 2010-12-07 NOTE — Progress Notes (Signed)
Summary: MEDICINE  Phone Note Call from Patient   Summary of Call: NEEDS PREVACID REFILLED AT [facility] AT [phone] Initial call taken by: Lind Guest,  June 18, 2010 8:31 AM  Follow-up for Phone Call        paitent aware med was sent yesterday Follow-up by: Adella Hare LPN,  June 18, 2010 8:53 AM

## 2010-12-07 NOTE — Miscellaneous (Signed)
  Clinical Lists Changes  Problems: Added new problem of OTHER SCREENING MAMMOGRAM (ICD-V76.12) Orders: Added new Referral order of Radiology Referral (Radiology) - Signed

## 2010-12-07 NOTE — Assessment & Plan Note (Signed)
Summary: F UP   Vital Signs:  Patient profile:   50 year old female Menstrual status:  hysterectomy Height:      61.5 inches Weight:      120 pounds BMI:     22.39 O2 Sat:      97 % on Room air Pulse rate:   100 / minute Pulse rhythm:   regular Resp:     16 per minute BP sitting:   134 / 90  (left arm)  Vitals Entered By: Adella Hare LPN (September 27, 2010 4:09 PM)  O2 Flow:  Room air CC: follow-up visit Is Patient Diabetic? No Pain Assessment Patient in pain? no        Primary Care Provider:  Syliva Overman MD  CC:  follow-up visit.  History of Present Illness: States she was recently told she had black spots in her eyes, in other words her vision is worsening. C/O uncontrolled allergies wants singulair back. Reports  that she had been doing fairly well up untilrecently. Denies recent fever or chills. Denies sinus pressure, nasal congestion , ear pain or sore throat. Denies chest congestion, or cough productive of sputum. Denies chest pain, palpitations, PND, orthopnea or leg swelling. Denies abdominal pain, nausea, vomitting, diarrhea or constipation. Denies change in bowel movements or bloody stool. Denies dysuria , frequency, incontinence or hesitancy. Denies  joint pain, swelling, or reduced mobility. Denies headaches, vertigo, seizures.  Denies  rash, lesions, or itch.     Current Medications (verified): 1)  Prevacid 30 Mg  Cpdr (Lansoprazole) .... One Tab By Mouth Once Daily 2)  Proair Hfa 108 (90 Base) Mcg/act Aers (Albuterol Sulfate) .... Use 2 Puffs Every 4-6 Hrs As Needed 3)  Crestor 10 Mg Tabs (Rosuvastatin Calcium) .... One Tab By Mouth Qhs 4)  Flonase 50 Mcg/act Susp (Fluticasone Propionate) .... 2 Puffs Per Nostril Daily 5)  Venlafaxine Hcl 37.5 Mg Tabs (Venlafaxine Hcl) .... Take 1 Tablet By Mouth Once A Day 6)  Triamterene-Hctz 37.5-25 Mg Tabs (Triamterene-Hctz) .... Take 1 Tablet By Mouth Once A Day  Allergies (verified): No Known Drug  Allergies  Review of Systems      See HPI General:  Denies chills and fever. Eyes:  Complains of vision loss-both eyes. Resp:  Complains of cough and wheezing. MS:  Complains of low back pain, stiffness, and thoracic pain. Psych:  Denies anxiety and depression. Endo:  Denies cold intolerance, excessive hunger, excessive thirst, and excessive urination. Heme:  Denies abnormal bruising and bleeding. Allergy:  Complains of itching eyes, seasonal allergies, and sneezing.  Physical Exam  General:  Well-developed,well-nourished,in no acute distress; alert,appropriate and cooperative throughout examination HEENT: No facial asymmetry,  EOMI, No sinus tenderness, TM's Clear, oropharynx  pink and moist.   Chest: Clear to auscultation bilaterally.  CVS: S1, S2, No murmurs, No S3.   Abd: Soft, Nontender.  ZO:[id number]  ROM spine,adequatein  hips, shoulders and knees.  Ext: No edema.   CNS: CN 2-12 intact, power tone and sensation normal throughout.   Skin: Intact, no visible lesions or rashes.  Psych: Good eye contact, normal affect.  Memory intact, not anxious or depressed appearing.    Impression & Recommendations:  Problem # 1:  ALLERGIC RHINITIS, SEASONAL (ICD-477.0) Assessment Deteriorated p to resume singulair  Problem # 2:  HYPERTENSION (ICD-401.9) Assessment: Unchanged  Her updated medication list for this problem includes:    Triamterene-hctz 37.5-25 Mg Tabs (Triamterene-hctz) .Marland Kitchen... Take 1 tablet by mouth once a day  BP today: 134/90  Prior BP: 130/92 (07/27/2010)  Labs Reviewed: K+: 3.6 (08/09/2010) Creat: : 0.98 (08/09/2010)   Chol: 170 (08/09/2010)   HDL: 69 (08/09/2010)   LDL: 89 (08/09/2010)   TG: 59 (08/09/2010)  Problem # 3:  HYPERLIPIDEMIA (ICD-272.4) Assessment: Unchanged  Her updated medication list for this problem includes:    Crestor 10 Mg Tabs (Rosuvastatin calcium) ..... One tab by mouth qhs  Labs Reviewed: SGOT: 17 (08/09/2010)   SGPT: 11  (08/09/2010)   HDL:69 (08/09/2010), 65 (12/15/2009)  LDL:89 (08/09/2010), 75 (12/15/2009)  Chol:170 (08/09/2010), 160 (12/15/2009)  Trig:59 (08/09/2010), 98 (12/15/2009)  Problem # 4:  BACK PAIN WITH RADICULOPATHY (ICD-729.2) Assessment: Deteriorated  Orders: Depo- Medrol 80mg  (J1040) Ketorolac-Toradol 15mg  (Z6109) Admin of Therapeutic Inj  intramuscular or subcutaneous (60454)  Complete Medication List: 1)  Proair Hfa 108 (90 Base) Mcg/act Aers (Albuterol sulfate) .... Use 2 puffs every 4-6 hrs as needed 2)  Crestor 10 Mg Tabs (Rosuvastatin calcium) .... One tab by mouth qhs 3)  Flonase 50 Mcg/act Susp (Fluticasone propionate) .... 2 puffs per nostril daily 4)  Venlafaxine Hcl 37.5 Mg Tabs (Venlafaxine hcl) .... Take 1 tablet by mouth once a day 5)  Triamterene-hctz 37.5-25 Mg Tabs (Triamterene-hctz) .... Take 1 tablet by mouth once a day 6)  Singulair 10 Mg Tabs (Montelukast sodium) .... Take 1 tablet by mouth once a day 7)  Nexium 40 Mg Cpdr (Esomeprazole magnesium) .... Take 1 capsule by mouth once a day  Other Orders: Medicare Electronic Prescription [phone])  Patient Instructions: 1)  Please schedule a follow-up appointment in 4 months. 2)  It is important that you exercise regularly at least 330 minutes 5 times a week. If you develop chest pain, have severe difficulty breathing, or feel very tired , stop exercising immediately and seek medical attention. 3)  New med for reflux, and singualir has been sent in. 4)  Pls keep up with your eye appts. 5)  No other med changes Prescriptions: NEXIUM 40 MG CPDR (ESOMEPRAZOLE MAGNESIUM) Take 1 capsule by mouth once a day  #30 x 3   Entered and Authorized by:   Syliva Overman MD   Signed by:   Syliva Overman MD on 09/27/2010   Method used:   Printed then faxed to ...       [facility]* (retail)       [address]       [place], [place]  [address]       Ph: [phone]       Fax: [phone]    RxID:   [id number] SINGULAIR 10 MG TABS (MONTELUKAST SODIUM) Take 1 tablet by mouth once a day  #90 x 1   Entered and Authorized by:   Syliva Overman MD   Signed by:   Syliva Overman MD on 09/27/2010   Method used:   Electronically to        [facility] (retail)       [address]       [place], [place]  [address]       Ph: [phone]       Fax: [phone]   RxID:   [id number]    Medication Administration  Injection # 1:    Medication: Depo- Medrol 80mg     Diagnosis: BACK PAIN WITH RADICULOPATHY (ICD-729.2)    Route: IM    Site: RUOQ gluteus    Exp Date: 06/12  Lot #: [id number]    Mfr: Pharmacia    Patient tolerated injection without complications    Given by: Adella Hare LPN (September 27, 2010 4:56 PM)  Injection # 2:    Medication: Ketorolac-Toradol 15mg     Diagnosis: BACK PAIN WITH RADICULOPATHY (ICD-729.2)    Route: IM    Site: LUOQ gluteus    Exp Date: 09/08/2011    Lot #: [id number]    Mfr: [email or web address]    Comments: toradol 60mg  given    Patient tolerated injection without complications    Given by: Adella Hare LPN (September 27, 2010 4:57 PM)  Orders Added: 1)  Est. Patient Level IV [47829] 2)  Medicare Electronic Prescription [G8553] 3)  Depo- Medrol 80mg  [J1040] 4)  Ketorolac-Toradol 15mg  [J1885] 5)  Admin of Therapeutic Inj  intramuscular or subcutaneous [96372]     Medication Administration  Injection # 1:    Medication: Depo- Medrol 80mg     Diagnosis: BACK PAIN WITH RADICULOPATHY (ICD-729.2)    Route: IM    Site: RUOQ gluteus    Exp Date: 06/12    Lot #: [id number]    Mfr: Pharmacia    Patient tolerated injection without complications    Given by: Adella Hare LPN (September 27, 2010 4:56 PM)  Injection # 2:    Medication: Ketorolac-Toradol 15mg     Diagnosis: BACK PAIN WITH RADICULOPATHY (ICD-729.2)    Route: IM    Site: LUOQ gluteus    Exp Date: 09/08/2011    Lot #: [id number]    Mfr: [email or web address]     Comments: toradol 60mg  given    Patient tolerated injection without complications    Given by: Adella Hare LPN (September 27, 2010 4:57 PM)  Orders Added: 1)  Est. Patient Level IV [65784] 2)  Medicare Electronic Prescription [G8553] 3)  Depo- Medrol 80mg  [J1040] 4)  Ketorolac-Toradol 15mg  [J1885] 5)  Admin of Therapeutic Inj  intramuscular or subcutaneous [69629]

## 2010-12-07 NOTE — Letter (Signed)
Summary: Letter  Letter   Imported By: Lind Guest 07/30/2010 13:30:19  _____________________________________________________________________  External Attachment:    Type:   Image     Comment:   External Document

## 2010-12-07 NOTE — Progress Notes (Signed)
Summary: refill  Phone Note Call from Patient   Summary of Call: pt wants to know if predisone was ever called into [place] [phone] Initial call taken by: Rudene Anda,  February 04, 2010 1:28 PM  Follow-up for Phone Call        advised patient doctor will not be prescribing this, already had a course early in the month Follow-up by: Adella Hare LPN,  February 04, 2010 1:38 PM

## 2010-12-07 NOTE — Progress Notes (Signed)
Summary: predisone  Phone Note Call from Patient   Summary of Call: wants to know can she get her predisone refilled     [place] [facility]  call back at [phone] to let her know Initial call taken by: Lind Guest,  January 12, 2010 3:26 PM  Follow-up for Phone Call        pls refill x 1 Follow-up by: Syliva Overman MD,  January 12, 2010 5:25 PM  Additional Follow-up for Phone Call Additional follow up Details #1::        Rx Called In Additional Follow-up by: Adella Hare LPN,  January 13, 2010 9:01 AM    New/Updated Medications: PREDNISONE (PAK) 5 MG TABS (PREDNISONE) uad Prescriptions: PREDNISONE (PAK) 5 MG TABS (PREDNISONE) uad  #21 x 0   Entered by:   Adella Hare LPN   Authorized by:   Syliva Overman MD   Signed by:   Adella Hare LPN on 82/95/6213   Method used:   Electronically to        [facility] (retail)       [address] [place], [place]  [address]       Ph: [phone]       Fax: [phone]   RxID:   [id number]

## 2010-12-07 NOTE — Assessment & Plan Note (Signed)
Summary: follow up/slj   Vital Signs:  Patient profile:   50 year old female Menstrual status:  hysterectomy Height:      61.5 inches Weight:      123.25 pounds BMI:     22.99 O2 Sat:      98 % Pulse rate:   80 / minute Pulse rhythm:   regular Resp:     16 per minute BP sitting:   130 / 92  (left arm) Cuff size:   regular  Vitals Entered By: Everitt Amber LPN (July 27, 2010 2:35 PM) CC: supposed to have CPE today, was not scheduled for one    Primary Care Provider:  Syliva Overman MD  CC:  supposed to have CPE today and was not scheduled for one .  History of Present Illness: Reports  that she has been doing well. Denies recent fever or chills. Denies sinus pressure, nasal congestion , ear pain or sore throat. Denies chest congestion, or cough productive of sputum. Denies chest pain, palpitations, PND, orthopnea or leg swelling. Denies abdominal pain, nausea, vomitting, diarrhea or constipation. Denies change in bowel movements or bloody stool. Denies dysuria , frequency, incontinence or hesitancy. She continues to have chronic neck pain with reduced mobilityand neck spasm Denies headaches, vertigo, seizures. Denies depression, anxiety or insomnia. c/o painful lump in left breast x 2 weeks   Allergies: No Known Drug Allergies  Review of Systems      See HPI General:  Complains of fatigue. Eyes:  Complains of vision loss-both eyes; glaucoma. Endo:  Denies cold intolerance, excessive hunger, excessive thirst, excessive urination, and heat intolerance. Heme:  Denies abnormal bruising and bleeding. Allergy:  Complains of seasonal allergies.  Physical Exam  General:  Well-developed,well-nourished,in no acute distress; alert,appropriate and cooperative throughout examination Head:  Normocephalic and atraumatic without obvious abnormalities. No apparent alopecia or balding. Eyes:  vision grossly intact and pupils equal.   Ears:  External ear exam shows no  significant lesions or deformities.  Otoscopic examination reveals clear canals, tympanic membranes are intact bilaterally without bulging, retraction, inflammation or discharge. Hearing is grossly normal bilaterally. Nose:  External nasal examination shows no deformity or inflammation. Nasal mucosa are pink and moist without lesions or exudates. Mouth:  pharynx pink and moist, fair dentition, and teeth missing.   Neck:  No deformities, masses, or tenderness noted.Right trapezius spasm and tenderness Chest Wall:  No deformities, masses, or tenderness noted. Breasts:  No mass, nodules, thickening, tenderness, bulging, retraction, inflamation, nipple discharge or skin changes noted. in rightbreast, left breast lump at 7 oclock  Lungs:  Normal respiratory effort, chest expands symmetrically. Lungs are clear to auscultation, no crackles or wheezes. Heart:  Normal rate and regular rhythm. S1 and S2 normal without gallop, murmur, click, rub or other extra sounds. Abdomen:  Bowel sounds positive,abdomen soft and non-tender without masses, organomegaly or hernias noted. Rectal:  No external abnormalities noted. Normal sphincter tone. No rectal masses or tenderness. Genitalia:  Vitiligo of labianormal introitus, no external lesions, no vaginal discharge, no vaginal atrophy, and no friaility or hemorrhage.  Uterus absent, no adnexal masses Msk:  No deformity or scoliosis noted of thoracic or lumbar spine.   Pulses:  R and L carotid,radial,femoral,dorsalis pedis and posterior tibial pulses are full and equal bilaterally Extremities:  No clubbing, cyanosis, edema, or deformity noted with normal full range of motion of all jointsexcept cervical spine   Neurologic:  No cranial nerve deficits noted. Station and gait are normal. Plantar reflexes are  down-going bilaterally. DTRs are symmetrical throughout. Sensory, motor and coordinative functions appear intact. Skin:  Intact without suspicious lesions or  rashes Cervical Nodes:  No lymphadenopathy noted Axillary Nodes:  No palpable lymphadenopathy Inguinal Nodes:  No significant adenopathy Psych:  Cognition and judgment appear intact. Alert and cooperative with normal attention span and concentration. No apparent delusions, illusions, hallucinations   Impression & Recommendations:  Problem # 1:  SPECIAL SCREENING FOR MALIGNANT NEOPLASMS COLON (ICD-V76.51) Assessment Comment Only  Orders: Hemoccult Guaiac-1 spec.(in office) (82270), negative  Problem # 2:  LUMP OR MASS IN BREAST (ICD-611.72) Assessment: Comment Only  Orders: Radiology Referral (Radiology)  Problem # 3:  CERVICAL SPASM (ICD-847.0) Assessment: Unchanged  Her updated medication list for this problem includes:    Ibuprofen 800 Mg Tabs (Ibuprofen) .Marland Kitchen... Take 1 tablet by mouth three times a day    Cyclobenzaprine Hcl 10 Mg Tabs (Cyclobenzaprine hcl) .Marland Kitchen... Take 1 tablet by mouth three times a day as needed this is sedating , so use at bedtime one only  Problem # 4:  HYPERLIPIDEMIA (ICD-272.4) Assessment: Comment Only  Her updated medication list for this problem includes:    Crestor 10 Mg Tabs (Rosuvastatin calcium) ..... One tab by mouth qhs Low fat dietdiscussed and encouraged needs labs Labs Reviewed: SGOT: 16 (12/15/2009)   SGPT: 12 (12/15/2009)   HDL:65 (12/15/2009), 72 (06/03/2009)  LDL:75 (12/15/2009), 81 (06/03/2009)  Chol:160 (12/15/2009), 167 (06/03/2009)  Trig:98 (12/15/2009), 72 (06/03/2009)  Problem # 5:  HYPERTENSION (ICD-401.9) Assessment: Unchanged  The following medications were removed from the medication list:    Maxzide-25 37.5-25 Mg Tabs (Triamterene-hctz) ..... One half tablet once daily Her updated medication list for this problem includes:    Triamterene-hctz 37.5-25 Mg Tabs (Triamterene-hctz) .Marland Kitchen... Take 1 tablet by mouth once a day  BP today: 130/92 Prior BP: 140/94 (06/08/2010)  Labs Reviewed: K+: 4.1 (12/15/2009) Creat: : 0.81  (12/15/2009)   Chol: 160 (12/15/2009)   HDL: 65 (12/15/2009)   LDL: 75 (12/15/2009)   TG: 98 (12/15/2009)  Complete Medication List: 1)  Prevacid 30 Mg Cpdr (Lansoprazole) .... One tab by mouth once daily 2)  Proair Hfa 108 (90 Base) Mcg/act Aers (Albuterol sulfate) .... Use 2 puffs every 4-6 hrs as needed 3)  Nasacort Aq 55 Mcg/act Aers (Triamcinolone acetonide(nasal)) .... 2 puffs in each nostril once daily 4)  Crestor 10 Mg Tabs (Rosuvastatin calcium) .... One tab by mouth qhs 5)  Singulair 10 Mg Tabs (Montelukast sodium) .... Take 1 tab by mouth at bedtime 6)  Ibuprofen 800 Mg Tabs (Ibuprofen) .... Take 1 tablet by mouth three times a day 7)  Cyclobenzaprine Hcl 10 Mg Tabs (Cyclobenzaprine hcl) .... Take 1 tablet by mouth three times a day as needed this is sedating , so use at bedtime one only 8)  Prednisone (pak) 5 Mg Tabs (Prednisone) .... Uad 9)  Flonase 50 Mcg/act Susp (Fluticasone propionate) .... 2 puffs per nostril daily 10)  Venlafaxine Hcl 37.5 Mg Tabs (Venlafaxine hcl) .... Take 1 tablet by mouth once a day 11)  Triamterene-hctz 37.5-25 Mg Tabs (Triamterene-hctz) .... Take 1 tablet by mouth once a day  Other Orders: Depo- Medrol 80mg  (J1040) Ketorolac-Toradol 15mg  (Z6109) Admin of Therapeutic Inj  intramuscular or subcutaneous (60454) Pap Smear (09811)  Patient Instructions: 1)  Please schedule a follow-up appointment in 2 months. 2)  It is important that you exercise regularly at least 20 minutes 5 times a week. If you develop chest pain, have severe difficulty breathing, or feel  very tired , stop exercising immediately and seek medical attention. 3)  you will be referrd for a mamogram and [place] to eval breast lump inleft breast . 4)  injections today for your neck pain and meds are sent in also 5)  INCREASE the BP med to oNE tab daily pls Prescriptions: FLONASE 50 MCG/ACT SUSP (FLUTICASONE PROPIONATE) 2 puffs per nostril daily  #1 x 3   Entered by:   Everitt Amber LPN    Authorized by:   Syliva Overman MD   Signed by:   Everitt Amber LPN on 54/07/8118   Method used:   Electronically to        [facility] (retail)       [address]       [place], [place]  [address]       Ph: [phone]       Fax: [phone]   RxID:   [id number] PREDNISONE (PAK) 5 MG TABS (PREDNISONE) uad  #21 x 0   Entered by:   Everitt Amber LPN   Authorized by:   Syliva Overman MD   Signed by:   Everitt Amber LPN on 64/40/3474   Method used:   Electronically to        [facility] (retail)       [address] [place], [place]  [address]       Ph: [phone]       Fax: [phone]   RxID:   [id number] PREVACID 30 MG  CPDR (LANSOPRAZOLE) one tab by mouth once daily  #30 Each x 6   Entered by:   Everitt Amber LPN   Authorized by:   Syliva Overman MD   Signed by:   Everitt Amber LPN on 20/25/4270   Method used:   Electronically to        [facility] (retail)       [address] [place], [place]  [address]       Ph: [phone]       Fax: [phone]   RxID:   [id number] TRIAMTERENE-HCTZ 37.5-25 MG TABS (TRIAMTERENE-HCTZ) Take 1 tablet by mouth once a day  #30 x 3   Entered and Authorized by:   Syliva Overman MD   Signed by:   Syliva Overman MD on 07/27/2010   Method used:   Printed then faxed to ...       [facility] (retail)       [address]       [place], [place]  [address]       Ph: [phone]       Fax: [phone]   RxID:   [id number]    Medication Administration  Injection # 1:    Medication: Depo- Medrol 80mg     Diagnosis: NECK PAIN (ICD-723.1)    Route: IM    Site: RUOQ gluteus    Exp Date: 03/2011    Lot #: [id number] [id number]    Mfr: Pharmacia    Comments: 80mg  given     Patient tolerated injection without complications    Given by: Everitt Amber LPN (July 27, 2010 3:30  PM)  Injection # 2:    Medication: Ketorolac-Toradol 15mg     Diagnosis: NECK PAIN (ICD-723.1)  Route: IM    Site: LUOQ gluteus    Exp Date: 01/2012    Lot #: [id number]     Mfr: [email or web address]    Comments: 60mg  given     Patient tolerated injection without complications    Given by: Everitt Amber LPN (July 27, 2010 3:31 PM)  Orders Added: 1)  Est. Patient 40-64 years [99396] 2)  Radiology Referral [Radiology] 3)  Depo- Medrol 80mg  [J1040] 4)  Ketorolac-Toradol 15mg  [J1885] 5)  Admin of Therapeutic Inj  intramuscular or subcutaneous [96372] 6)  Pap Smear [88150] 7)  Hemoccult Guaiac-1 spec.(in office) [82270]    Laboratory Results    Stool - Occult Blood Hemmoccult #1: negative Date: 07/27/2010 Comments: 50201 10L 2/13 118 1012

## 2010-12-07 NOTE — Progress Notes (Signed)
Summary: refill  Phone Note Call from Patient   Summary of Call: pt needs to get a refill on previcad  [phone] Initial call taken by: Rudene Anda,  June 16, 2010 11:49 AM    Prescriptions: PREVACID 30 MG  CPDR (LANSOPRAZOLE) one tab by mouth once daily  #30 Each x 0   Entered by:   Adella Hare LPN   Authorized by:   Syliva Overman MD   Signed by:   Adella Hare LPN on 16/08/9603   Method used:   Electronically to        [facility] (retail)       [address] [place], [place]  [address]       Ph: [phone]       Fax: [phone]   RxID:   [id number]

## 2010-12-07 NOTE — Progress Notes (Signed)
Summary: lab work  Phone Note Call from Patient   Summary of Call: can't take lab work because she doesn't have mediciad yet. [phone] Initial call taken by: Rudene Anda,  November 25, 2009 9:02 AM  Follow-up for Phone Call        noted Follow-up by: Syliva Overman MD,  November 25, 2009 12:39 PM

## 2010-12-09 NOTE — Progress Notes (Signed)
Summary: headache  Phone Note Call from Patient   Summary of Call: pt has headacheon both sides of head. and pain both ears. would like to get a rx for it. [phone] Initial call taken by: Rudene Anda,  November 26, 2010 11:09 AM  Follow-up for Phone Call        ibuprofen sent in pls let her know Follow-up by: Syliva Overman MD,  November 26, 2010 12:41 PM  Additional Follow-up for Phone Call Additional follow up Details #1::        Phone Call Completed Additional Follow-up by: Adella Hare LPN,  November 26, 2010 1:54 PM    New/Updated Medications: IBUPROFEN 800 MG TABS (IBUPROFEN) Take 1 tablet by mouth two times a day as needed for severe headache , Prescriptions: IBUPROFEN 800 MG TABS (IBUPROFEN) Take 1 tablet by mouth two times a day as needed for severe headache ,  #20 x 0   Entered and Authorized by:   Syliva Overman MD   Signed by:   Syliva Overman MD on 11/26/2010   Method used:   Electronically to        [facility] (retail)       [address]       [place], [place]  [address]       Ph: [phone]       Fax: [phone]   RxID:   [id number]

## 2011-01-27 ENCOUNTER — Ambulatory Visit: Payer: Self-pay | Admitting: Family Medicine

## 2011-01-28 ENCOUNTER — Other Ambulatory Visit: Payer: Self-pay | Admitting: Family Medicine

## 2011-01-31 ENCOUNTER — Encounter: Payer: Self-pay | Admitting: Family Medicine

## 2011-02-01 ENCOUNTER — Encounter: Payer: Self-pay | Admitting: Family Medicine

## 2011-02-02 ENCOUNTER — Encounter: Payer: Self-pay | Admitting: Family Medicine

## 2011-02-02 ENCOUNTER — Ambulatory Visit (INDEPENDENT_AMBULATORY_CARE_PROVIDER_SITE_OTHER): Payer: Medicare Other | Admitting: Family Medicine

## 2011-02-02 VITALS — BP 140/90 | HR 72 | Resp 16 | Ht 61.5 in | Wt 121.8 lb

## 2011-02-02 DIAGNOSIS — M542 Cervicalgia: Secondary | ICD-10-CM

## 2011-02-02 DIAGNOSIS — Z299 Encounter for prophylactic measures, unspecified: Secondary | ICD-10-CM

## 2011-02-02 DIAGNOSIS — M79609 Pain in unspecified limb: Secondary | ICD-10-CM

## 2011-02-02 DIAGNOSIS — M79673 Pain in unspecified foot: Secondary | ICD-10-CM

## 2011-02-02 DIAGNOSIS — I10 Essential (primary) hypertension: Secondary | ICD-10-CM

## 2011-02-02 DIAGNOSIS — E785 Hyperlipidemia, unspecified: Secondary | ICD-10-CM

## 2011-02-02 MED ORDER — ROSUVASTATIN CALCIUM 10 MG PO TABS: 10.0000 mg | ORAL_TABLET | Freq: Every day | ORAL | Status: DC

## 2011-02-02 MED ORDER — TRIAMTERENE-HCTZ 37.5-25 MG PO CAPS: 1.0000 | ORAL_CAPSULE | Freq: Every day | ORAL | Status: DC

## 2011-02-02 MED ORDER — KETOROLAC TROMETHAMINE 30 MG/ML IJ SOLN
60.0000 mg | Freq: Once | INTRAMUSCULAR | Status: AC
  Administered 2011-02-02: 60 mg via INTRAVENOUS

## 2011-02-02 MED ORDER — MONTELUKAST SODIUM 10 MG PO TABS: 10.0000 mg | ORAL_TABLET | Freq: Every day | ORAL | Status: DC

## 2011-02-02 MED ORDER — VENLAFAXINE HCL 37.5 MG PO TABS: 37.5000 mg | ORAL_TABLET | Freq: Every day | ORAL | Status: DC

## 2011-02-02 MED ORDER — BENAZEPRIL HCL 10 MG PO TABS: 10.0000 mg | ORAL_TABLET | Freq: Every day | ORAL | Status: DC

## 2011-02-02 MED ORDER — METHYLPREDNISOLONE ACETATE 80 MG/ML IJ SUSP
80.0000 mg | Freq: Once | INTRAMUSCULAR | Status: AC
  Administered 2011-02-02: 80 mg via INTRAMUSCULAR

## 2011-02-02 MED ORDER — ESOMEPRAZOLE MAGNESIUM 40 MG PO CPDR: 40.0000 mg | DELAYED_RELEASE_CAPSULE | Freq: Every day | ORAL | Status: DC

## 2011-02-02 MED ORDER — IBUPROFEN 800 MG PO TABS: 800.0000 mg | ORAL_TABLET | Freq: Two times a day (BID) | ORAL | Status: DC | PRN

## 2011-02-02 NOTE — Patient Instructions (Signed)
F/u in 4 months.  Mammogram past due pls have it done , we are scheduling  Injections today  For right heel pain and neck pain.  Fasting labs in 4 months, chem 7, lipid , and hepatic.  All the best with your surgery.  Additional new med for your blood pressure which is high 140/90, continue what you are already taking

## 2011-02-02 NOTE — Progress Notes (Signed)
  Subjective:    Patient ID: Toni Miller, female    DOB: Oct 14, 1961, 50 y.o.   MRN: [id number]  HPI Pt reports worsening of her vision, is scheduled for left cataract extraction in May 2012, she also reports elevated intra occular pressure despite medication. C/O neck and upper back pain radiating to posterior head at times, essentially unchanged, feels spasm at times, does not want injections today. Right heel pain and swelling x 4 days, has had injection for hell spur in the past.Wants injection for this and meds   Review of Systems  Constitutional: Positive for fatigue. Negative for fever, chills, activity change, appetite change and unexpected weight change.  HENT: Negative for hearing loss, ear pain, congestion, sore throat, rhinorrhea, sneezing, trouble swallowing, neck pain, neck stiffness, postnasal drip and sinus pressure.   Eyes: Positive for visual disturbance. Negative for photophobia, pain, discharge, redness and itching.  Respiratory: Negative for cough, chest tightness, shortness of breath and wheezing.   Cardiovascular: Negative for chest pain, palpitations and leg swelling.  Gastrointestinal: Negative for nausea, vomiting, abdominal pain, diarrhea, constipation and blood in stool.  Genitourinary: Negative for dysuria, frequency, hematuria and flank pain.  Musculoskeletal: Positive for back pain and joint swelling. Negative for myalgias, arthralgias and gait problem.  Skin: Negative for rash and wound.  Neurological: Negative for dizziness, tremors, seizures, speech difficulty, weakness, numbness and headaches.  Hematological: Negative for adenopathy. Does not bruise/bleed easily.  Psychiatric/Behavioral: Negative for suicidal ideas, hallucinations, behavioral problems, confusion, sleep disturbance and decreased concentration. The patient is not nervous/anxious and is not hyperactive.        Objective:   Physical Exam  Constitutional: She is oriented to person, place, and  time. She appears well-developed and well-nourished.  HENT:  Head: Normocephalic.  Right Ear: External ear normal.  Left Ear: External ear normal.  Mouth/Throat: No oropharyngeal exudate.  Eyes: Conjunctivae and EOM are normal. Right eye exhibits no discharge. Left eye exhibits no discharge. No scleral icterus.  Neck: Normal range of motion. Neck supple. No JVD present. No tracheal deviation present. No thyromegaly present.  Cardiovascular: Normal rate, regular rhythm, normal heart sounds and intact distal pulses.   No murmur heard. Pulmonary/Chest: Effort normal and breath sounds normal. No stridor. No respiratory distress. She has no wheezes. She has no rales. She exhibits no tenderness.  Abdominal: Soft. Bowel sounds are normal. There is no tenderness. There is no rebound and no guarding.  Musculoskeletal: She exhibits no edema.       Decreased ROM cervical spine with spasm. Heel tender to palpation  Lymphadenopathy:    She has no cervical adenopathy.  Neurological: She is alert and oriented to person, place, and time. No cranial nerve deficit. Coordination normal.  Skin: Skin is warm and dry. No rash noted. No erythema.  Psychiatric: She has a normal mood and affect. Her behavior is normal. Judgment and thought content normal.          Assessment & Plan:  1. Hypertension:Uncotrolled, medication compliance addressed. Changes in medication made as needed and the importance of commitment to lifestyle changes to improve blood pressure discussed and encouraged. 2.Neck and heel pain : deteriorated , injections administered. Gaucoma with vision loss: deteriorated. Reports upcoming surgery.

## 2011-02-04 ENCOUNTER — Telehealth: Payer: Self-pay | Admitting: Family Medicine

## 2011-02-04 NOTE — Telephone Encounter (Signed)
Do you want her to get OTC multivitamin or do I sent an RX to [facility]?

## 2011-02-06 ENCOUNTER — Encounter: Payer: Self-pay | Admitting: Family Medicine

## 2011-02-06 MED ORDER — MULTI-VITAMIN/MINERALS PO TABS: 1.0000 | ORAL_TABLET | Freq: Every day | ORAL | Status: AC

## 2011-02-06 NOTE — Telephone Encounter (Signed)
pls let her know script sent in

## 2011-02-06 NOTE — Telephone Encounter (Signed)
pls let her know med sent in 

## 2011-02-07 NOTE — Telephone Encounter (Signed)
Patient aware that the prescription was sent to the pharmacy

## 2011-02-28 ENCOUNTER — Other Ambulatory Visit: Payer: Self-pay | Admitting: Family Medicine

## 2011-03-23 ENCOUNTER — Other Ambulatory Visit: Payer: Self-pay | Admitting: Family Medicine

## 2011-03-25 NOTE — Op Note (Signed)
   NAME:  Toni Miller, Toni Miller                       ACCOUNT NO.:  [id number]   MEDICAL RECORD NO.:  [id number]                   PATIENT TYPE:  AMB   LOCATION:  DAY                                  FACILITY:  [facility]   PHYSICIAN:  Jerolyn Shin C. Katrinka Blazing, M.D.                DATE OF BIRTH:  1961-08-28   DATE OF PROCEDURE:  DATE OF DISCHARGE:                                 OPERATIVE REPORT   PREOPERATIVE DIAGNOSIS:  Left breast mass.   POSTOPERATIVE DIAGNOSIS:  Left breast mass.   PROCEDURE:  Left partial mastectomy.   SURGEON:  Dirk Dress. Katrinka Blazing, M.D.   DESCRIPTION OF PROCEDURE:  Under general anesthesia, the left breast was  prepped and draped in a sterile field.  The patient had a previous needle  localization done by radiology, Dr. Alver Fisher.   A curvilinear incision was made around the guidewire in the upper outer  quadrant.  The incision was extended to the subcutaneous tissue and down to  the breast tissue.  Staying close and slightly inferior to the needle, the  dissection was extended down close the chest wall.  The tissue was then  circumferentially excised.  The mass was easily palpable in the tissue.  It  was sent to radiology for verification.  The deep tissues were closed with 2-  0 Biosyn, and the subcutaneous tissue was closed with 2-0 Biosyn.  The skin  was closed with a subcuticular stitch of 4-0 Dexon.  A sterile dressing was  placed.   The patient tolerated the procedure well.  She was awakened from anesthesia,  transferred to a bed, and taken to the postanesthetic care area.                                               Dirk Dress. Katrinka Blazing, M.D.    LCS/MEDQ  D:  05/23/2003  T:  05/23/2003  Job:  [id number]   cc:   Lodema Hong, M.D.

## 2011-03-25 NOTE — H&P (Signed)
NAME:  Toni Miller, Toni Miller                       ACCOUNT NO.:  [id number]   MEDICAL RECORD NO.:  [id number]                   PATIENT TYPE:  AMB   LOCATION:  DAY                                  FACILITY:  [facility]   PHYSICIAN:  Jerolyn Shin C. Katrinka Blazing, M.D.                DATE OF BIRTH:  Jun 21, 1961   DATE OF ADMISSION:  DATE OF DISCHARGE:                                HISTORY & PHYSICAL   HISTORY OF PRESENT ILLNESS:  This is a 50 year old female with a history of  routine mammograms showing abnormality.  The patient was found to have  insignificant left breast mammogram that was done on June 16.  Magnification  revealed a mass at 11:30 to 12 o'clock position.  Also found to be this area  revealed the presence of a hypoechoic nodule with a likely lobulated border.  The mass was 10 x 8 mm.  The patient has a strong family history of breast  cancer in that her sister had breast cancer at age 1.  Because of this the  patient is scheduled for removal of the mass.   PAST MEDICAL HISTORY:  1. She has a history of uterine fibroids and chronic pelvic pain.  2. Gastroesophageal reflux disease.   MEDICATIONS:  1. Reglan 5 mg a.c. and h.s.  2. __________ 30 mg b.i.d.  3. Carafate 1 g t.i.d.   PAST SURGICAL HISTORY:  Tubal ligation.   FAMILY HISTORY:  Positive for breast cancer, lung cancer and stomach cancer.   SOCIAL HISTORY:  She is mother of three children, single and she is  employed.  She does not drink, smoke or use drugs.   PHYSICAL EXAMINATION:  VITAL SIGNS:  Blood pressure 120/86, pulse 72,  respirations 20, weight 132 pounds.  HEENT:  Unremarkable.  NECK:  Supple without JVD or bruits.  CHEST:  Clear to auscultation.  HEART:  Regular rate and rhythm without murmurs, rubs or gallops.  BREASTS:  Firm nodule, left breast in the upper outer quadrant.  This area  is tender to palpation.  ABDOMEN:  Soft, nontender.  No masses.  EXTREMITIES:  No clubbing, cyanosis or edema.  NEUROLOGIC:   No focal motor distress, __________. Marland Kitchen   IMPRESSION:  1. Nodule left breast, clinically and mammographically with a known family     history of breast cancer.  2.     Gastroesophageal reflux disease.  3. Uterine fibroids.   PLAN:  The patient will have needle localization and partial mastectomy left  breast.                                                     Jerolyn Shin C. Katrinka Blazing, M.D.    LCS/MEDQ  D:  05/23/2003  T:  05/23/2003  Job:  [id number]  

## 2011-03-25 NOTE — Op Note (Signed)
Toni Miller, Toni Miller             ACCOUNT NO.:  [id number]   MEDICAL RECORD NO.:  [id number]          PATIENT TYPE:  INP   LOCATION:  A428                          FACILITY:  [facility]   PHYSICIAN:  Lazaro Arms, M.D.   DATE OF BIRTH:  26-Dec-1960   DATE OF PROCEDURE:  05/24/2006  DATE OF DISCHARGE:                                 OPERATIVE REPORT   PREOPERATIVE DIAGNOSES:  1.  Fibroid uterus.  2.  Menometrorrhagia.  3.  Dysmenorrhea.   POSTOPERATIVE DIAGNOSES:  1.  Fibroid uterus.  2.  Menometrorrhagia.  3.  Dysmenorrhea.  4.  Endometriosis.   SURGEON:  Dr. Despina Hidden.   ANESTHESIA:  General endotracheal.   FINDINGS:  The patient had short of a globally enlarged uterus, maybe  adenomyosis, but she had endometriosis on both ovaries present - none in the  peritoneum. The rest of the peritoneal cavity was normal.   DESCRIPTION OF OPERATION:  The patient was taken to the operating room,  placed in a supine position where she underwent general endotracheal  anesthesia. She was prepped and draped in the usual sterile fashion  including the vagina, and a Foley catheter was placed. Pfannenstiel skin  incision was made and carried down sharply to the rectus fascia which was  scored in the midline and extended laterally. The fascia was taken off of  the muscle superiorly and inferiorly without difficulty. The muscles were  divided. The peritoneal cavity was entered. A self-retaining plastic  protractor was placed, and the upper abdomen was packed away, and the  patient was placed in Trendelenburg. The uterine cornu were grasped. The  left round ligament was sutured ligated and cut. The vesicouterine serosal  flap on the left was created, and the infundibulopelvic ligament was  isolated, clamped, cut and double suture ligated. The right round ligament  was suture ligated and cut. The vesicouterine serosal flap on the right was  created. The infundibulopelvic ligament on the right was  isolated, clamped,  cut and double suture ligated. The bladder flap was pushed down off of the  lower uterine segment. Uterine vessels were clamped, cut and suture ligated  bilaterally. Serial pedicles were taken down, the cervix through the  cardinal ligament, each pedicle being clamped, cut and suture ligated. The  vagina was then cross clamped. A specimen was removed. Vaginal angle sutures  were placed, and the vagina was closed using 0 Vicryl without difficulty.  The pelvis was irrigated vigorously. There was good hemostasis of all  pedicles. Interceed was placed over the vaginal cuff to try to prevent  postoperative adhesions for the future. All of the packs were removed, and  the protractor was removed. The peritoneum and muscles were reapproximated  loosely, the fascia closed using 0 Vicryl running. Subcutaneous tissue  was made hemostatic and irrigated. Skin was closed using skin staples. The  patient tolerated the procedure well. She experienced 100 cc of blood loss  and was taken to the recovery room in good, stable condition. All counts  were correct x3.      Lazaro Arms, M.D.  Electronically Signed  LHE/MEDQ  D:  05/24/2006  T:  05/24/2006  Job:  [id number]

## 2011-03-25 NOTE — Discharge Summary (Signed)
NAMEDELANY, STEURY             ACCOUNT NO.:  [id number]   MEDICAL RECORD NO.:  [id number]          PATIENT TYPE:  INP   LOCATION:  A428                          FACILITY:  [facility]   PHYSICIAN:  Lazaro Arms, M.D.   DATE OF BIRTH:  30-Mar-1961   DATE OF ADMISSION:  05/24/2006  DATE OF DISCHARGE:  07/21/2007LH                                 DISCHARGE SUMMARY   DISCHARGE DIAGNOSIS:  1.  Status post total abdominal hysterectomy with bilateral salpingo-      oophorectomy.  2.  Enlarged fibroid uterus.  3.  Menometrorrhagia, dysmenorrhea, pelvic pain.  4.  Endometriosis.   Please refer to the transcribed operative report and the handwritten history  and physical for details on admission to the hospital.   HOSPITAL COURSE:  The patient was admitted after surgery.  She did have  difficulties with nausea and vomiting postoperatively and also urinary  retention that was about 48 hours of both of those.  Once that was over, she  tolerated clear liquids then a regular diet without difficulty.  Her  hemoglobin and hematocrit were stable.  Her abdominal exam was benign  throughout.  Her incision was clean, dry, and intact.  She was extensively  ambulatory without difficulty.  She was discharged to home the morning of  postop day three in stable condition to follow up in the office on Wednesday  to take her staples out.  Hemoglobin and hematocrit at the time of discharge  were 12.4 and 36.  She was given a prescription for Tylox for pain.  To use  suppositories as needed for constipation.      Lazaro Arms, M.D.  Electronically Signed     LHE/MEDQ  D:  05/27/2006  T:  05/27/2006  Job:  [id number]

## 2011-04-05 ENCOUNTER — Encounter (HOSPITAL_COMMUNITY): Payer: Medicare Other

## 2011-04-05 LAB — BASIC METABOLIC PANEL
BUN: 14 mg/dL (ref 6–23)
Calcium: 11.1 mg/dL — ABNORMAL HIGH (ref 8.4–10.5)
Creatinine, Ser: 0.92 mg/dL (ref 0.4–1.2)
GFR calc non Af Amer: >60 mL/min (ref >60)
Potassium: 4.1 mEq/L (ref 3.5–5.1)

## 2011-04-05 LAB — HEMOGLOBIN AND HEMATOCRIT, BLOOD
HCT: 40.9 % (ref 36.0–46.0)
Hemoglobin: 14.5 g/dL (ref 12.0–15.0)

## 2011-04-07 ENCOUNTER — Ambulatory Visit (HOSPITAL_COMMUNITY)
Admission: RE | Admit: 2011-04-07 | Discharge: 2011-04-07 | Disposition: A | Discharge status: Home / Self Care | Payer: Medicare Other | Source: Ambulatory Visit | Attending: Ophthalmology | Admitting: Ophthalmology

## 2011-04-07 DIAGNOSIS — Z79899 Other long term (current) drug therapy: Secondary | ICD-10-CM | POA: Insufficient documentation

## 2011-04-07 DIAGNOSIS — I10 Essential (primary) hypertension: Secondary | ICD-10-CM | POA: Insufficient documentation

## 2011-04-07 DIAGNOSIS — Z01812 Encounter for preprocedural laboratory examination: Secondary | ICD-10-CM | POA: Insufficient documentation

## 2011-04-07 DIAGNOSIS — H25049 Posterior subcapsular polar age-related cataract, unspecified eye: Secondary | ICD-10-CM | POA: Insufficient documentation

## 2011-04-18 ENCOUNTER — Other Ambulatory Visit: Payer: Self-pay | Admitting: Family Medicine

## 2011-04-18 NOTE — Op Note (Signed)
  NAMECURRY, DULSKI                ACCOUNT NO.:  [id number]  MEDICAL RECORD NO.:  [id number]           PATIENT TYPE:  O  LOCATION:  DAYP                          FACILITY:  [facility]  PHYSICIAN:  Susanne Greenhouse, MD       DATE OF BIRTH:  30-Mar-1961  DATE OF PROCEDURE:  04/07/2011 DATE OF DISCHARGE:                              OPERATIVE REPORT   PREOPERATIVE DIAGNOSIS:  Posterior subcapsular cataract, left eye, diagnosis code 366.14.  POSTOPERATIVE DIAGNOSIS:  Posterior subcapsular cataract, left eye, diagnosis code 366.14.  Intraoperative floppy iris syndrome, diagnosis code 364.81.  PROSTHETIC DEVICE USED:  A Lenstec posterior chamber lens, model Softec HD, power of 22.0, serial number is [id number].          ______________________________ Susanne Greenhouse, MD     KEH/MEDQ  D:  04/07/2011  T:  04/08/2011  Job:  [id number]  Electronically Signed by Gemma Payor MD on 04/18/2011 12:22:05 PM

## 2011-05-10 LAB — BASIC METABOLIC PANEL
CO2: 34 mEq/L — ABNORMAL HIGH (ref 19–32)
Calcium: 10 mg/dL (ref 8.4–10.5)
Creat: 0.86 mg/dL (ref 0.50–1.10)
Sodium: 139 mEq/L (ref 135–145)

## 2011-05-10 LAB — LIPID PANEL
Cholesterol: 206 mg/dL — ABNORMAL HIGH (ref 0–200)
HDL: 74 mg/dL (ref >39)

## 2011-05-10 LAB — HEPATIC FUNCTION PANEL
ALT: 26 U/L (ref 0–35)
AST: 25 U/L (ref 0–37)
Albumin: 4.8 g/dL (ref 3.5–5.2)
Alkaline Phosphatase: 64 U/L (ref 39–117)
Total Bilirubin: 0.4 mg/dL (ref 0.3–1.2)

## 2011-05-12 ENCOUNTER — Encounter (HOSPITAL_COMMUNITY): Payer: Self-pay

## 2011-05-12 ENCOUNTER — Encounter (HOSPITAL_COMMUNITY)
Admission: RE | Admit: 2011-05-12 | Discharge: 2011-05-12 | Disposition: A | Discharge status: Home / Self Care | Payer: PRIVATE HEALTH INSURANCE | Source: Ambulatory Visit | Attending: Ophthalmology | Admitting: Ophthalmology

## 2011-05-12 ENCOUNTER — Telehealth: Payer: Self-pay | Admitting: Family Medicine

## 2011-05-12 HISTORY — DX: Chronic sinusitis, unspecified: J32.9

## 2011-05-12 MED ORDER — TRIAMTERENE-HCTZ 37.5-25 MG PO CAPS: 1.0000 | ORAL_CAPSULE | Freq: Every day | ORAL | Status: DC

## 2011-05-12 NOTE — Patient Instructions (Addendum)
20 Toni Miller  05/12/2011   Your procedure is scheduled on:  05/16/11  Report to [facility] at 07:30 AM.  Call this number if you have problems the morning of surgery: [phone]   Remember:   Do not eat food:After Midnight.  Do not drink clear liquids: After Midnight.  Take these medicines the morning of surgery with A SIP OF WATER: Effexor, Maxzide, Nexium, Dyazide, Albuterol. Bring inhaler with you.   Do not wear jewelry, make-up or nail polish.  Do not bring valuables to the hospital.  Contacts, dentures or bridgework may not be worn into surgery.  Leave suitcase in the car. After surgery it may be brought to your room.  For patients admitted to the hospital, checkout time is 11:00 AM the day of discharge.   Patients discharged the day of surgery will not be allowed to drive home.  Name and phone number of your driver: Jerilynn Som, son  Special Instructions: N/A   Please read over the following fact sheets that you were given: Pain Booklet and Anesthesia Post-op Instructions   PATIENT INSTRUCTIONS POST-ANESTHESIA  IMMEDIATELY FOLLOWING SURGERY:  Do not drive or operate machinery for the first twenty four hours after surgery.  Do not make any important decisions for twenty four hours after surgery or while taking narcotic pain medications or sedatives.  If you develop intractable nausea and vomiting or a severe headache please notify your doctor immediately.  FOLLOW-UP:  Please make an appointment with your surgeon as instructed. You do not need to follow up with anesthesia unless specifically instructed to do so.  WOUND CARE INSTRUCTIONS (if applicable):  Keep a dry clean dressing on the anesthesia/puncture wound site if there is drainage.  Once the wound has quit draining you may leave it open to air.  Generally you should leave the bandage intact for twenty four hours unless there is drainage.  If the epidural site drains for more than 36-48 hours please call the anesthesia  department.  QUESTIONS?:  Please feel free to call your physician or the hospital operator if you have any questions, and they will be happy to assist you.     [facility] Anesthesia Department [address] [place] [phone]

## 2011-05-12 NOTE — Telephone Encounter (Signed)
MED SENT 

## 2011-05-12 NOTE — Telephone Encounter (Signed)
pls refill x1 , and advise she needs ov in the next 4 to 8 weeks

## 2011-05-12 NOTE — Telephone Encounter (Signed)
Has appointment for July 25th

## 2011-05-16 ENCOUNTER — Ambulatory Visit (HOSPITAL_COMMUNITY): Payer: PRIVATE HEALTH INSURANCE | Admitting: Anesthesiology

## 2011-05-16 ENCOUNTER — Ambulatory Visit (HOSPITAL_COMMUNITY)
Admission: RE | Admit: 2011-05-16 | Discharge: 2011-05-16 | Disposition: A | Discharge status: Home / Self Care | Payer: PRIVATE HEALTH INSURANCE | Source: Ambulatory Visit | Attending: Ophthalmology | Admitting: Ophthalmology

## 2011-05-16 ENCOUNTER — Encounter (HOSPITAL_COMMUNITY): Payer: Self-pay | Admitting: *Deleted

## 2011-05-16 ENCOUNTER — Encounter (HOSPITAL_COMMUNITY)
Admission: RE | Disposition: A | Discharge status: Home / Self Care | Payer: Self-pay | Source: Ambulatory Visit | Attending: Ophthalmology

## 2011-05-16 ENCOUNTER — Encounter (HOSPITAL_COMMUNITY): Payer: Self-pay | Admitting: Anesthesiology

## 2011-05-16 DIAGNOSIS — I10 Essential (primary) hypertension: Secondary | ICD-10-CM | POA: Insufficient documentation

## 2011-05-16 DIAGNOSIS — Z79899 Other long term (current) drug therapy: Secondary | ICD-10-CM | POA: Insufficient documentation

## 2011-05-16 DIAGNOSIS — E7800 Pure hypercholesterolemia, unspecified: Secondary | ICD-10-CM | POA: Insufficient documentation

## 2011-05-16 DIAGNOSIS — H25049 Posterior subcapsular polar age-related cataract, unspecified eye: Secondary | ICD-10-CM | POA: Insufficient documentation

## 2011-05-16 HISTORY — PX: CATARACT EXTRACTION W/PHACO: SHX586

## 2011-05-16 SURGERY — PHACOEMULSIFICATION, CATARACT, WITH IOL INSERTION
Anesthesia: Monitor Anesthesia Care | Site: Eye | Laterality: Right | Wound class: Clean

## 2011-05-16 MED ORDER — LIDOCAINE HCL 3.5 % OP GEL
1.0000 "application " | Freq: Once | OPHTHALMIC | Status: AC
  Administered 2011-05-16: 1 via OPHTHALMIC

## 2011-05-16 MED ORDER — LIDOCAINE HCL 3.5 % OP GEL
OPHTHALMIC | Status: DC | PRN
  Administered 2011-05-16: 1 via OPHTHALMIC

## 2011-05-16 MED ORDER — MIDAZOLAM HCL 5 MG/5ML IJ SOLN
INTRAMUSCULAR | Status: DC | PRN
  Administered 2011-05-16: 2 mg via INTRAVENOUS

## 2011-05-16 MED ORDER — BALANCED SALT IO SOLN
INTRAOCULAR | Status: DC | PRN
  Administered 2011-05-16: 500 mL via OPHTHALMIC

## 2011-05-16 MED ORDER — PROVISC 10 MG/ML IO SOLN
INTRAOCULAR | Status: DC | PRN
  Administered 2011-05-16: 8.5 mg via OPHTHALMIC

## 2011-05-16 MED ORDER — EPINEPHRINE HCL 1 MG/ML IJ SOLN
INTRAOCULAR | Status: DC | PRN
  Administered 2011-05-16: 12:00:00

## 2011-05-16 MED ORDER — PHENYLEPHRINE HCL 2.5 % OP SOLN
1.0000 [drp] | OPHTHALMIC | Status: AC
  Administered 2011-05-16 (×3): 1 [drp] via OPHTHALMIC

## 2011-05-16 MED ORDER — LIDOCAINE HCL (PF) 1 % IJ SOLN
INTRAMUSCULAR | Status: AC
  Filled 2011-05-16: qty 2

## 2011-05-16 MED ORDER — LIDOCAINE HCL (PF) 1 % IJ SOLN
INTRAMUSCULAR | Status: DC | PRN
  Administered 2011-05-16: 2 mL
  Administered 2011-05-16: .9 mL

## 2011-05-16 MED ORDER — LACTATED RINGERS IV SOLN: INTRAVENOUS | Status: DC

## 2011-05-16 MED ORDER — LACTATED RINGERS IV SOLN
INTRAVENOUS | Status: DC | PRN
  Administered 2011-05-16: 11:00:00 via INTRAVENOUS

## 2011-05-16 MED ORDER — MIDAZOLAM HCL 2 MG/2ML IJ SOLN
INTRAMUSCULAR | Status: AC
  Filled 2011-05-16: qty 2

## 2011-05-16 MED ORDER — CYCLOPENTOLATE-PHENYLEPHRINE 0.2-1 % OP SOLN
OPHTHALMIC | Status: AC
  Filled 2011-05-16: qty 2

## 2011-05-16 MED ORDER — TETRACAINE HCL 0.5 % OP SOLN
OPHTHALMIC | Status: AC
  Filled 2011-05-16: qty 2

## 2011-05-16 MED ORDER — EPINEPHRINE HCL 1 MG/ML IJ SOLN
INTRAMUSCULAR | Status: AC
  Filled 2011-05-16: qty 1

## 2011-05-16 MED ORDER — BSS IO SOLN
INTRAOCULAR | Status: DC | PRN
  Administered 2011-05-16: 30 mL via OPHTHALMIC

## 2011-05-16 MED ORDER — PHENYLEPHRINE HCL 2.5 % OP SOLN
OPHTHALMIC | Status: AC
  Filled 2011-05-16: qty 2

## 2011-05-16 MED ORDER — MIDAZOLAM HCL 2 MG/2ML IJ SOLN
INTRAMUSCULAR | Status: AC
  Administered 2011-05-16: 2 mg via INTRAVENOUS
  Filled 2011-05-16: qty 2

## 2011-05-16 MED ORDER — EPINEPHRINE HCL 1 MG/ML IJ SOLN
INTRAMUSCULAR | Status: DC | PRN
  Administered 2011-05-16: 1 mL via INTRAMUSCULAR

## 2011-05-16 MED ORDER — LIDOCAINE HCL 3.5 % OP GEL
OPHTHALMIC | Status: AC
  Administered 2011-05-16: 1 via OPHTHALMIC
  Filled 2011-05-16: qty 5

## 2011-05-16 MED ORDER — CYCLOPENTOLATE-PHENYLEPHRINE 0.2-1 % OP SOLN
1.0000 [drp] | OPHTHALMIC | Status: AC
  Administered 2011-05-16 (×3): 1 [drp] via OPHTHALMIC

## 2011-05-16 MED ORDER — TETRACAINE HCL 0.5 % OP SOLN
1.0000 [drp] | OPHTHALMIC | Status: AC
  Administered 2011-05-16 (×3): 1 [drp] via OPHTHALMIC

## 2011-05-16 MED ORDER — NEOMYCIN-POLYMYXIN-DEXAMETH 3.5-10000-0.1 OP OINT
TOPICAL_OINTMENT | OPHTHALMIC | Status: AC
  Filled 2011-05-16: qty 3.5

## 2011-05-16 SURGICAL SUPPLY — 32 items
CAPSULAR TENSION RING-AMO (OPHTHALMIC RELATED) IMPLANT
CLOTH BEACON ORANGE TIMEOUT ST (SAFETY) ×2 IMPLANT
DUOVISC SYSTEM (INTRAOCULAR LENS)
EYE SHIELD UNIVERSAL CLEAR (GAUZE/BANDAGES/DRESSINGS) ×2 IMPLANT
GLOVE BIO SURGEON STRL SZ 6.5 (GLOVE) IMPLANT
GLOVE BIOGEL PI IND STRL 6.5 (GLOVE) ×1 IMPLANT
GLOVE BIOGEL PI IND STRL 7.0 (GLOVE) IMPLANT
GLOVE BIOGEL PI IND STRL 7.5 (GLOVE) IMPLANT
GLOVE BIOGEL PI INDICATOR 6.5 (GLOVE) ×1
GLOVE BIOGEL PI INDICATOR 7.0 (GLOVE)
GLOVE BIOGEL PI INDICATOR 7.5 (GLOVE)
GLOVE ECLIPSE 6.5 STRL STRAW (GLOVE) IMPLANT
GLOVE ECLIPSE 7.0 STRL STRAW (GLOVE) IMPLANT
GLOVE ECLIPSE 7.5 STRL STRAW (GLOVE) IMPLANT
GLOVE EXAM NITRILE LRG STRL (GLOVE) IMPLANT
GLOVE EXAM NITRILE MD LF STRL (GLOVE) ×2 IMPLANT
GLOVE SKINSENSE NS SZ6.5 (GLOVE)
GLOVE SKINSENSE NS SZ7.0 (GLOVE)
GLOVE SKINSENSE STRL SZ6.5 (GLOVE) IMPLANT
GLOVE SKINSENSE STRL SZ7.0 (GLOVE) IMPLANT
KIT VITRECTOMY (OPHTHALMIC RELATED) IMPLANT
PAD ARMBOARD 7.5X6 YLW CONV (MISCELLANEOUS) ×2 IMPLANT
PROC W NO LENS (INTRAOCULAR LENS)
PROC W SPEC LENS (INTRAOCULAR LENS)
PROCESS W NO LENS (INTRAOCULAR LENS) IMPLANT
PROCESS W SPEC LENS (INTRAOCULAR LENS) IMPLANT
RING MALYGIN (MISCELLANEOUS) IMPLANT
SIGHTPATH CAT PROC W REG LENS (Ophthalmic Related) ×2 IMPLANT
SYR TB 1ML LL NO SAFETY (SYRINGE) ×2 IMPLANT
SYSTEM DUOVISC (INTRAOCULAR LENS) IMPLANT
VISCOELASTIC ADDITIONAL (OPHTHALMIC RELATED) IMPLANT
WATER STERILE IRR 250ML POUR (IV SOLUTION) ×2 IMPLANT

## 2011-05-16 NOTE — Transfer of Care (Signed)
  Anesthesia Post-op Note  Patient: Toni Miller  Procedure(s) Performed:  CATARACT EXTRACTION PHACO AND INTRAOCULAR LENS PLACEMENT (IOC)  Patient Location: PACU and Short Stay  Anesthesia Type: MAC  Level of Consciousness: alert   Airway and Oxygen Therapy: Patient Spontanous Breathing  Post-op Pain: 0 /10  Post-op Assessment: Post-op Vital signs reviewed  Post-op Vital Signs: stable  Complications: No apparent anesthesia complications

## 2011-05-16 NOTE — Anesthesia Preprocedure Evaluation (Deleted)
Anesthesia Evaluation Anesthesia Physical Anesthesia Plan Anesthesia Quick Evaluation  

## 2011-05-16 NOTE — Anesthesia Preprocedure Evaluation (Addendum)
Anesthesia Evaluation  Name, MR# and DOB Patient awake  General Assessment Comment  Reviewed: Allergy & Precautions, H&P  and Patient's Chart, lab work & pertinent test results  Airway Mallampati: II  Neck ROM: full    Dental  (+) Teeth Intact   Pulmonary    pulmonary exam normal   Cardiovascular hypertension, regular Normal   Neuro/Psych  GI/Hepatic/Renal (+)  GERD Controlled     Endo/Other   Abdominal   Musculoskeletal  Hematology   Peds  Reproductive/Obstetrics          Anesthesia Physical Anesthesia Plan  ASA: II  Anesthesia Plan: MAC   Post-op Pain Management:    Induction:   Airway Management Planned:   Additional Equipment:   Intra-op Plan:   Post-operative Plan:   Informed Consent: I have reviewed the patients History and Physical, chart, labs and discussed the procedure including the risks, benefits and alternatives for the proposed anesthesia with the patient or authorized representative who has indicated his/her understanding and acceptance.     Plan Discussed with: CRNA  Anesthesia Plan Comments:        Anesthesia Quick Evaluation

## 2011-05-16 NOTE — Anesthesia Procedure Notes (Addendum)
Procedures

## 2011-05-16 NOTE — Op Note (Signed)
Toni Miller, Toni Miller                ACCOUNT NO.:  [id number]  MEDICAL RECORD NO.:  [id number]  LOCATION:  APPO                          FACILITY:  [facility]  PHYSICIAN:  Susanne Greenhouse, MD       DATE OF BIRTH:  [birth year]  DATE OF PROCEDURE:  05/16/2011 DATE OF DISCHARGE:  05/16/2011                              OPERATIVE REPORT   PREOPERATIVE DIAGNOSIS:  Posterior subcapsular cataract, left eye, diagnosis code 366.14  POSTOPERATIVE DIAGNOSIS:  Posterior subcapsular cataract, left eye, diagnosis code 366.14.  PROCEDURE PERFORMED:  Phacoemulsification with intraocular lens implantation, left eye.  SURGEON:  Bonne Dolores. Doyne Micke, MD  DESCRIPTION OF OPERATION:  In the preoperative holding area, dilating drops and viscous lidocaine were placed into the left eye.  The patient was then brought to the operating room where she was prepped and draped. Beginning with a 75-blade, a paracentesis port was made at the surgeon's 2 o'clock position.  The anterior chamber was then filled with a 1% nonpreserved lidocaine solution.  This was followed by filling the anterior chamber with Provisc.  The 2.4-mm keratome blade was then used to make a clear corneal incision at the temporal limbus.  A bent cystotome needle and Utrata forceps were used to create a continuous tear capsulotomy.  Hydrodissection was performed with balanced salt solution on a fine cannula.  The lens nucleus was then removed using phacoemulsification in a quadrant cracking technique.  Residual cortex was removed with irrigation and aspiration.  The capsular bag and anterior chamber were then refilled with Provisc and a posterior chamber intraocular lens was placed into the capsular bag without difficulty using a lens injecting system.  The Provisc was removed from the capsular bag and anterior chamber with irrigation and aspiration. Stromal hydration of the main incision and paracentesis ports was performed with balanced salt solution on a  fine cannula.  The wounds were tested for leak, which were negative.  The patient tolerated the procedure well.  There were no operative complications and she was returned to the recovery area in satisfactory condition.  There were no surgical specimens.  PROSTHETIC DEVICE USED:  Lenstec posterior chamber lens, model Softec HD, power of 21.75, serial number is [id number].          ______________________________ Susanne Greenhouse, MD    KEH/MEDQ  D:  05/16/2011  T:  05/16/2011  Job:  [id number]

## 2011-05-16 NOTE — Anesthesia Postprocedure Evaluation (Signed)
Immediate Anesthesia Transfer of Care Note  Patient: Toni Miller  Procedure(s) Performed:  CATARACT EXTRACTION PHACO AND INTRAOCULAR LENS PLACEMENT (IOC)  Patient Location: PACU and Short Stay  Anesthesia Type: MAC  Level of Consciousness: awake, alert  and oriented  Airway & Oxygen Therapy: Patient Spontanous Breathing  Post-op Assessment: Post -op Vital signs reviewed and stable  Post vital signs: stable  Complications: No apparent anesthesia complications

## 2011-05-16 NOTE — Transfer of Care (Signed)
  Anesthesia Post-op Note  Patient: Toni Miller  Procedure(s) Performed:  CATARACT EXTRACTION PHACO AND INTRAOCULAR LENS PLACEMENT (IOC)  Patient Location: PACU and Short Stay  Anesthesia Type: MAC  Level of Consciousness: awake and alert   Airway and Oxygen Therapy: Patient Spontanous Breathing  Post-op Pain: 0 /10  Post-op Assessment: Post-op Vital signs reviewed  Post-op Vital Signs: stable  Complications: No apparent anesthesia complications

## 2011-05-16 NOTE — Brief Op Note (Signed)
Pre-Op Dx: Cataract OD Post-Op Dx: Cataract OD Surgeon: Gemma Payor Anesthesia: Topical with MAC Implant: Lenstec, Model Softec HD, 21.75 Blood Loss: None Specimen: None Complications: None Dictation: [id number]

## 2011-05-16 NOTE — Progress Notes (Signed)
I have evaluated the patient preoperatively and have identified no interval changes in the medical condition and plan of care since the history and physical of record.    I have evaluated the patient preoperatively and have identified no interval changes in the medical condition and plan of care since the history and physical of record.

## 2011-05-18 ENCOUNTER — Telehealth: Payer: Self-pay | Admitting: Family Medicine

## 2011-05-18 MED ORDER — IBUPROFEN 800 MG PO TABS: 800.0000 mg | ORAL_TABLET | Freq: Two times a day (BID) | ORAL | Status: AC

## 2011-05-18 MED ORDER — TRIAMTERENE-HCTZ 37.5-25 MG PO TABS: 1.0000 | ORAL_TABLET | Freq: Every day | ORAL | Status: DC

## 2011-05-18 NOTE — Telephone Encounter (Signed)
meds sent as requested 

## 2011-05-24 NOTE — Addendum Note (Signed)
Addendum  created 05/24/11 1117 by Aurora Mask, CRNA   Modules edited:Anesthesia Blocks and Procedures, Inpatient Notes

## 2011-05-25 ENCOUNTER — Other Ambulatory Visit: Payer: Self-pay | Admitting: Family Medicine

## 2011-05-26 ENCOUNTER — Encounter (HOSPITAL_COMMUNITY): Payer: Self-pay | Admitting: Ophthalmology

## 2011-06-01 ENCOUNTER — Ambulatory Visit (INDEPENDENT_AMBULATORY_CARE_PROVIDER_SITE_OTHER): Payer: PRIVATE HEALTH INSURANCE | Admitting: Family Medicine

## 2011-06-01 ENCOUNTER — Encounter: Payer: Self-pay | Admitting: Family Medicine

## 2011-06-01 VITALS — BP 124/80 | HR 103 | Resp 16 | Ht 61.25 in | Wt 117.1 lb

## 2011-06-01 DIAGNOSIS — M79609 Pain in unspecified limb: Secondary | ICD-10-CM

## 2011-06-01 DIAGNOSIS — K219 Gastro-esophageal reflux disease without esophagitis: Secondary | ICD-10-CM

## 2011-06-01 DIAGNOSIS — N951 Menopausal and female climacteric states: Secondary | ICD-10-CM

## 2011-06-01 DIAGNOSIS — M79651 Pain in right thigh: Secondary | ICD-10-CM | POA: Insufficient documentation

## 2011-06-01 DIAGNOSIS — K5900 Constipation, unspecified: Secondary | ICD-10-CM | POA: Insufficient documentation

## 2011-06-01 DIAGNOSIS — K5909 Other constipation: Secondary | ICD-10-CM | POA: Insufficient documentation

## 2011-06-01 DIAGNOSIS — E785 Hyperlipidemia, unspecified: Secondary | ICD-10-CM

## 2011-06-01 DIAGNOSIS — R51 Headache: Secondary | ICD-10-CM

## 2011-06-01 DIAGNOSIS — I10 Essential (primary) hypertension: Secondary | ICD-10-CM

## 2011-06-01 MED ORDER — CELECOXIB 200 MG PO CAPS: 200.0000 mg | ORAL_CAPSULE | Freq: Every day | ORAL | Status: DC

## 2011-06-01 MED ORDER — IBUPROFEN 800 MG PO TABS: 800.0000 mg | ORAL_TABLET | Freq: Three times a day (TID) | ORAL | Status: DC | PRN

## 2011-06-01 MED ORDER — ROSUVASTATIN CALCIUM 10 MG PO TABS: 10.0000 mg | ORAL_TABLET | Freq: Every day | ORAL | Status: DC

## 2011-06-01 MED ORDER — VENLAFAXINE HCL 37.5 MG PO TABS: 37.5000 mg | ORAL_TABLET | Freq: Every day | ORAL | Status: DC

## 2011-06-01 MED ORDER — POLYETHYLENE GLYCOL 3350 17 GM/SCOOP PO POWD: 17.0000 g | Freq: Every day | ORAL | Status: AC

## 2011-06-01 MED ORDER — ESOMEPRAZOLE MAGNESIUM 40 MG PO CPDR: 40.0000 mg | DELAYED_RELEASE_CAPSULE | Freq: Every day | ORAL | Status: DC

## 2011-06-01 MED ORDER — KETOROLAC TROMETHAMINE 60 MG/2ML IM SOLN
60.0000 mg | Freq: Once | INTRAMUSCULAR | Status: AC
  Administered 2011-06-01: 60 mg via INTRAMUSCULAR

## 2011-06-01 NOTE — Patient Instructions (Addendum)
F/u in  4.5 months.  pls call in September for the flu vaccine   you will get injections in the office for right thigh pain, and ibuprofen  Will be sent  In for 10 days.  Reduce caffeine , and we will l give you info on hot flashes

## 2011-06-08 ENCOUNTER — Encounter: Payer: Self-pay | Admitting: Family Medicine

## 2011-06-08 ENCOUNTER — Ambulatory Visit (INDEPENDENT_AMBULATORY_CARE_PROVIDER_SITE_OTHER): Payer: PRIVATE HEALTH INSURANCE | Admitting: Family Medicine

## 2011-06-08 VITALS — BP 122/82 | HR 87 | Resp 16 | Ht 61.0 in | Wt 119.0 lb

## 2011-06-08 DIAGNOSIS — I10 Essential (primary) hypertension: Secondary | ICD-10-CM

## 2011-06-08 DIAGNOSIS — L259 Unspecified contact dermatitis, unspecified cause: Secondary | ICD-10-CM

## 2011-06-08 MED ORDER — HYDROXYZINE HCL 10 MG PO TABS: 10.0000 mg | ORAL_TABLET | Freq: Three times a day (TID) | ORAL | Status: AC | PRN

## 2011-06-08 MED ORDER — DIPHENHYDRAMINE HCL 50 MG/ML IJ SOLN
25.0000 mg | Freq: Once | INTRAMUSCULAR | Status: AC
  Administered 2011-06-08: 25 mg via INTRAMUSCULAR

## 2011-06-08 MED ORDER — PREDNISONE (PAK) 5 MG PO TABS: 5.0000 mg | ORAL_TABLET | ORAL | Status: DC

## 2011-06-08 MED ORDER — METHYLPREDNISOLONE ACETATE 80 MG/ML IJ SUSP
80.0000 mg | Freq: Once | INTRAMUSCULAR | Status: AC
Start: 2011-06-08 — End: 2011-06-08
  Administered 2011-06-08: 80 mg via INTRAMUSCULAR

## 2011-06-08 NOTE — Assessment & Plan Note (Signed)
Acute allergic reaction, med will be administered and prescribed

## 2011-06-08 NOTE — Progress Notes (Signed)
  Subjective:    Patient ID: Toni Miller, female    DOB: [birth year], 50 y.o.   MRN: [id number]  HPI 3 day h/o pruritic red rash on both lower extremities. No new exposures as far as food, soaps, med or toiletries. Denies difficulty breathing or swallowing. No fever, chills, or drainage from the rash   Review of Systems   See HPI Denies recent fever or chills. Denies sinus pressure, nasal congestion, ear pain or sore throat. Denies chest congestion, productive cough or wheezing. Denies chest pains, palpitations and leg swelling        Objective:   Physical Exam Patient alert and oriented and in no cardiopulmonary distress.  HEENT: No facial asymmetry, EOMI, no sinus tenderness,  oropharynx pink and moist.  Neck supple no adenopathy. No airway obstruction in oropharyngeal area  Chest: Clear to auscultation bilaterally.No wheezes  CVS: S1, S2 no murmurs, no S3.  ABD: Soft non tender. Bowel sounds normal.  Ext: No edema    Skin: Intact, erythematous macular rash on left leg and right inner thigh. No skin breakdown        Assessment & Plan:

## 2011-06-08 NOTE — Patient Instructions (Signed)
F/u as before.  You will get injections for the rash and itching, also medications have been sent to your pharmacy

## 2011-06-12 DIAGNOSIS — N951 Menopausal and female climacteric states: Secondary | ICD-10-CM | POA: Insufficient documentation

## 2011-06-12 HISTORY — DX: Menopausal and female climacteric states: N95.1

## 2011-06-12 NOTE — Assessment & Plan Note (Signed)
Reports disabling symptoms disturbing sleep , will start med for this

## 2011-06-12 NOTE — Assessment & Plan Note (Signed)
Controlled, no change in medication  

## 2011-06-12 NOTE — Assessment & Plan Note (Signed)
Deteriorated currently, no focal neurologic deficits on history or exam

## 2011-06-12 NOTE — Assessment & Plan Note (Signed)
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

## 2011-06-12 NOTE — Progress Notes (Signed)
  Subjective:    Patient ID: Toni Miller, female    DOB: Aug 20, 1961, 50 y.o.   MRN: [id number]  HPI C/o increased and uncontrolled headaches aggravated by poor sleep associated with hot flashes. Also reports increase thigh pain in the past week, radiating from low back, no h/o recent trauma or of similar problem. C/o increased and disabling hot flashes, which actually disturb her sleep    Review of Systems Denies recent fever or chills. Denies sinus pressure, nasal congestion, ear pain or sore throat. Denies chest congestion, productive cough or wheezing. Denies chest pains, palpitations and leg swelling Denies abdominal pain, nausea, vomiting,diarrhea or constipation.   Denies dysuria, frequency, hesitancy or incontinence. Denies depression, anxiety or insomnia. Denies skin break down or rash.        Objective:   Physical Exam Patient alert and oriented and in no cardiopulmonary distress.  HEENT: No facial asymmetry, EOMI, no sinus tenderness,  oropharynx pink and moist.  Neck supple no adenopathy.  Chest: Clear to auscultation bilaterally.  CVS: S1, S2 no murmurs, no S3.  ABD: Soft non tender. Bowel sounds normal.  Ext: No edema  MS: Adequate ROM spine, shoulders, hips and knees.  Skin: Intact, no ulcerations or rash noted.  Psych: Good eye contact, normal affect. Memory intact not anxious or depressed appearing.  CNS: CN 2-12 intact, power, tone and sensation normal throughout.        Assessment & Plan:

## 2011-06-15 ENCOUNTER — Telehealth: Payer: Self-pay | Admitting: Family Medicine

## 2011-06-15 DIAGNOSIS — I10 Essential (primary) hypertension: Secondary | ICD-10-CM

## 2011-06-15 MED ORDER — BENAZEPRIL HCL 10 MG PO TABS: 10.0000 mg | ORAL_TABLET | Freq: Every day | ORAL | Status: DC

## 2011-06-15 NOTE — Telephone Encounter (Signed)
Refill sent as requested. 

## 2011-07-27 ENCOUNTER — Ambulatory Visit (INDEPENDENT_AMBULATORY_CARE_PROVIDER_SITE_OTHER): Payer: PRIVATE HEALTH INSURANCE | Admitting: *Deleted

## 2011-07-27 DIAGNOSIS — Z23 Encounter for immunization: Secondary | ICD-10-CM

## 2011-08-03 ENCOUNTER — Other Ambulatory Visit: Payer: Self-pay | Admitting: Family Medicine

## 2011-08-03 DIAGNOSIS — Z139 Encounter for screening, unspecified: Secondary | ICD-10-CM

## 2011-08-05 ENCOUNTER — Ambulatory Visit (HOSPITAL_COMMUNITY)
Admission: RE | Admit: 2011-08-05 | Discharge: 2011-08-05 | Disposition: A | Discharge status: Home / Self Care | Payer: PRIVATE HEALTH INSURANCE | Source: Ambulatory Visit | Attending: Family Medicine | Admitting: Family Medicine

## 2011-08-05 DIAGNOSIS — Z1231 Encounter for screening mammogram for malignant neoplasm of breast: Secondary | ICD-10-CM | POA: Insufficient documentation

## 2011-08-05 DIAGNOSIS — Z139 Encounter for screening, unspecified: Secondary | ICD-10-CM

## 2011-08-10 ENCOUNTER — Telehealth: Payer: Self-pay | Admitting: Family Medicine

## 2011-08-10 MED ORDER — VENLAFAXINE HCL 37.5 MG PO TABS: 37.5000 mg | ORAL_TABLET | Freq: Every day | ORAL | Status: DC

## 2011-08-10 NOTE — Telephone Encounter (Signed)
Refilled as requested  

## 2011-08-15 ENCOUNTER — Telehealth: Payer: Self-pay | Admitting: Family Medicine

## 2011-08-16 ENCOUNTER — Ambulatory Visit (INDEPENDENT_AMBULATORY_CARE_PROVIDER_SITE_OTHER): Payer: PRIVATE HEALTH INSURANCE | Admitting: Family Medicine

## 2011-08-16 ENCOUNTER — Encounter: Payer: Self-pay | Admitting: Family Medicine

## 2011-08-16 VITALS — BP 122/80 | HR 91 | Temp 99.8°F | Resp 16 | Ht 61.0 in | Wt 117.0 lb

## 2011-08-16 DIAGNOSIS — M542 Cervicalgia: Secondary | ICD-10-CM

## 2011-08-16 DIAGNOSIS — E041 Nontoxic single thyroid nodule: Secondary | ICD-10-CM | POA: Insufficient documentation

## 2011-08-16 DIAGNOSIS — E785 Hyperlipidemia, unspecified: Secondary | ICD-10-CM

## 2011-08-16 DIAGNOSIS — J040 Acute laryngitis: Secondary | ICD-10-CM | POA: Insufficient documentation

## 2011-08-16 DIAGNOSIS — L259 Unspecified contact dermatitis, unspecified cause: Secondary | ICD-10-CM

## 2011-08-16 DIAGNOSIS — J309 Allergic rhinitis, unspecified: Secondary | ICD-10-CM

## 2011-08-16 DIAGNOSIS — J301 Allergic rhinitis due to pollen: Secondary | ICD-10-CM

## 2011-08-16 DIAGNOSIS — I10 Essential (primary) hypertension: Secondary | ICD-10-CM

## 2011-08-16 MED ORDER — ROSUVASTATIN CALCIUM 10 MG PO TABS: 10.0000 mg | ORAL_TABLET | Freq: Every day | ORAL | Status: DC

## 2011-08-16 MED ORDER — METHYLPREDNISOLONE ACETATE 80 MG/ML IJ SUSP
80.0000 mg | Freq: Once | INTRAMUSCULAR | Status: AC
  Administered 2011-08-16: 80 mg via INTRAMUSCULAR

## 2011-08-16 MED ORDER — PREDNISONE (PAK) 5 MG PO TABS: 5.0000 mg | ORAL_TABLET | ORAL | Status: DC

## 2011-08-16 MED ORDER — IBUPROFEN 800 MG PO TABS: 800.0000 mg | ORAL_TABLET | Freq: Three times a day (TID) | ORAL | Status: DC | PRN

## 2011-08-16 MED ORDER — TRIAMTERENE-HCTZ 37.5-25 MG PO TABS: 1.0000 | ORAL_TABLET | Freq: Every day | ORAL | Status: DC

## 2011-08-16 MED ORDER — MONTELUKAST SODIUM 10 MG PO TABS: ORAL_TABLET | ORAL | Status: DC

## 2011-08-16 MED ORDER — KETOROLAC TROMETHAMINE 30 MG/ML IJ SOLN
60.0000 mg | Freq: Once | INTRAMUSCULAR | Status: AC
  Administered 2011-08-16: 60 mg via INTRAMUSCULAR

## 2011-08-16 MED ORDER — VENLAFAXINE HCL 37.5 MG PO TABS: 37.5000 mg | ORAL_TABLET | Freq: Every day | ORAL | Status: DC

## 2011-08-16 NOTE — Telephone Encounter (Signed)
Patient is here now for a visit.  

## 2011-08-16 NOTE — Patient Instructions (Signed)
F/u in 4 months.  Injections today for uincontrolled allergies and neck pain.  You are being referred for thyroid US and to ENT to f/u nodules

## 2011-08-16 NOTE — Progress Notes (Signed)
  Subjective:    Patient ID: Toni Miller, female    DOB: Mar 03, 1961, 49 y.o.   MRN: [id number]  HPI 1 week h/o excessive nasal congestion and clear drainage, itchy watery eyes, loss of voice , no fever or chills. Left neck pain and left ear pressure. Stye on right eye x 1 week, seen by opthalmology, this past week, warm compresses advised   Review of Systems See HPI Denies recent fever or chills. Denies sinus pressure,  or sore throat. Denies chest congestion, productive cough or wheezing. Denies chest pains, palpitations and leg swelling Denies abdominal pain, nausea, vomiting,diarrhea or constipation.   Denies dysuria, frequency, hesitancy or incontinence.  Denies headaches, seizures, numbness, or tingling. Denies depression, anxiety or insomnia. Denies skin break down or rash.        Objective:   Physical Exam Patient alert and oriented and in no cardiopulmonary distress.  HEENT: No facial asymmetry, EOMI, no sinus tenderness,  oropharynx pink and moist.  Neck left trapezius spasm with reduced ROM, no adenopathy. Erythema and edema of nasal mucosa, TM clear bilaterally  Chest: Clear to auscultation bilaterally.  CVS: S1, S2 no murmurs, no S3.  ABD: Soft non tender. Bowel sounds normal.  Ext: No edema  MS: Adequate ROM spine, shoulders, hips and knees.Decreased ROM neck with left trapezius spasm Skin: Intact, no ulcerations or rash noted.  Psych: Good eye contact, normal affect. Memory intact not anxious or depressed appearing.  CNS: CN 2-12 intact, power, tone and sensation normal throughout.        Assessment & Plan:

## 2011-08-17 ENCOUNTER — Telehealth: Payer: Self-pay | Admitting: Family Medicine

## 2011-08-17 MED ORDER — CYCLOBENZAPRINE HCL 10 MG PO TABS: ORAL_TABLET | ORAL | Status: DC

## 2011-08-17 NOTE — Assessment & Plan Note (Signed)
Uncontrolled with increased symptoms, depo medrol in office and steroid dose pack

## 2011-08-17 NOTE — Assessment & Plan Note (Signed)
Acute left neck pain and spasm, anti inflammatories administered

## 2011-08-17 NOTE — Telephone Encounter (Signed)
pls send in apotheacary syrup 5cc every 8 hours as needed for cough x 8 ounces no refill, let pt know

## 2011-08-17 NOTE — Assessment & Plan Note (Signed)
Needs radiologic follow up as well as ENT evaluation

## 2011-08-17 NOTE — Telephone Encounter (Signed)
Other med sent in. Wants cough med sent in to apothecary

## 2011-08-18 ENCOUNTER — Ambulatory Visit (HOSPITAL_COMMUNITY)
Admission: RE | Admit: 2011-08-18 | Discharge: 2011-08-18 | Disposition: A | Discharge status: Home / Self Care | Payer: PRIVATE HEALTH INSURANCE | Source: Ambulatory Visit | Attending: Family Medicine | Admitting: Family Medicine

## 2011-08-18 ENCOUNTER — Telehealth: Payer: Self-pay | Admitting: Family Medicine

## 2011-08-18 DIAGNOSIS — E049 Nontoxic goiter, unspecified: Secondary | ICD-10-CM | POA: Insufficient documentation

## 2011-08-18 DIAGNOSIS — E041 Nontoxic single thyroid nodule: Secondary | ICD-10-CM

## 2011-08-19 ENCOUNTER — Telehealth: Payer: Self-pay | Admitting: Family Medicine

## 2011-08-19 NOTE — Telephone Encounter (Signed)
Will not send this expensive and not covered pt aware

## 2011-08-19 NOTE — Telephone Encounter (Signed)
Spoke directly with pt advised robitussin dm is advised cough meds not covered. Also no need for antibiotics

## 2011-08-19 NOTE — Telephone Encounter (Signed)
Based on exam and history does not need an antibiotic at this time

## 2011-08-19 NOTE — Telephone Encounter (Signed)
Patient aware.

## 2011-08-24 NOTE — Telephone Encounter (Signed)
Patient states she is feeling better and will see ent on 10/25

## 2011-09-01 ENCOUNTER — Ambulatory Visit (INDEPENDENT_AMBULATORY_CARE_PROVIDER_SITE_OTHER): Payer: PRIVATE HEALTH INSURANCE | Admitting: Otolaryngology

## 2011-09-01 DIAGNOSIS — D449 Neoplasm of uncertain behavior of unspecified endocrine gland: Secondary | ICD-10-CM

## 2011-09-26 ENCOUNTER — Encounter: Payer: Self-pay | Admitting: Family Medicine

## 2011-10-05 ENCOUNTER — Ambulatory Visit: Payer: PRIVATE HEALTH INSURANCE | Admitting: Family Medicine

## 2011-10-05 ENCOUNTER — Encounter: Payer: Self-pay | Admitting: Family Medicine

## 2011-11-11 ENCOUNTER — Other Ambulatory Visit: Payer: Self-pay | Admitting: Family Medicine

## 2011-12-10 ENCOUNTER — Other Ambulatory Visit: Payer: Self-pay | Admitting: Family Medicine

## 2011-12-19 ENCOUNTER — Ambulatory Visit (INDEPENDENT_AMBULATORY_CARE_PROVIDER_SITE_OTHER): Payer: PRIVATE HEALTH INSURANCE | Admitting: Family Medicine

## 2011-12-19 ENCOUNTER — Encounter: Payer: Self-pay | Admitting: Family Medicine

## 2011-12-19 VITALS — BP 120/80 | HR 105 | Resp 16 | Ht 61.0 in | Wt 119.0 lb

## 2011-12-19 DIAGNOSIS — M1990 Unspecified osteoarthritis, unspecified site: Secondary | ICD-10-CM | POA: Insufficient documentation

## 2011-12-19 DIAGNOSIS — S139XXA Sprain of joints and ligaments of unspecified parts of neck, initial encounter: Secondary | ICD-10-CM

## 2011-12-19 DIAGNOSIS — J301 Allergic rhinitis due to pollen: Secondary | ICD-10-CM

## 2011-12-19 DIAGNOSIS — L0201 Cutaneous abscess of face: Secondary | ICD-10-CM

## 2011-12-19 DIAGNOSIS — R05 Cough: Secondary | ICD-10-CM

## 2011-12-19 DIAGNOSIS — M542 Cervicalgia: Secondary | ICD-10-CM

## 2011-12-19 DIAGNOSIS — M899 Disorder of bone, unspecified: Secondary | ICD-10-CM

## 2011-12-19 DIAGNOSIS — L309 Dermatitis, unspecified: Secondary | ICD-10-CM | POA: Insufficient documentation

## 2011-12-19 DIAGNOSIS — R5381 Other malaise: Secondary | ICD-10-CM

## 2011-12-19 DIAGNOSIS — L03211 Cellulitis of face: Secondary | ICD-10-CM

## 2011-12-19 DIAGNOSIS — I10 Essential (primary) hypertension: Secondary | ICD-10-CM

## 2011-12-19 DIAGNOSIS — E785 Hyperlipidemia, unspecified: Secondary | ICD-10-CM

## 2011-12-19 MED ORDER — KETOROLAC TROMETHAMINE 60 MG/2ML IM SOLN
60.0000 mg | Freq: Once | INTRAMUSCULAR | Status: AC
  Administered 2011-12-19: 60 mg via INTRAMUSCULAR

## 2011-12-19 MED ORDER — CYCLOBENZAPRINE HCL 10 MG PO TABS: ORAL_TABLET | ORAL | Status: DC

## 2011-12-19 MED ORDER — BENAZEPRIL HCL 10 MG PO TABS: 10.0000 mg | ORAL_TABLET | Freq: Every day | ORAL | Status: DC

## 2011-12-19 MED ORDER — PROMETHAZINE-DM 6.25-15 MG/5ML PO SYRP: ORAL_SOLUTION | ORAL | Status: AC

## 2011-12-19 MED ORDER — ROSUVASTATIN CALCIUM 10 MG PO TABS: 10.0000 mg | ORAL_TABLET | Freq: Every day | ORAL | Status: DC

## 2011-12-19 MED ORDER — DOXYCYCLINE HYCLATE 100 MG PO TABS: 100.0000 mg | ORAL_TABLET | Freq: Two times a day (BID) | ORAL | Status: AC

## 2011-12-19 MED ORDER — CELECOXIB 200 MG PO CAPS: ORAL_CAPSULE | ORAL | Status: DC

## 2011-12-19 MED ORDER — VENLAFAXINE HCL 37.5 MG PO TABS: 37.5000 mg | ORAL_TABLET | Freq: Every day | ORAL | Status: DC

## 2011-12-19 MED ORDER — ESOMEPRAZOLE MAGNESIUM 40 MG PO CPDR: 40.0000 mg | DELAYED_RELEASE_CAPSULE | Freq: Every day | ORAL | Status: DC

## 2011-12-19 MED ORDER — FLUTICASONE PROPIONATE 50 MCG/ACT NA SUSP: 2.0000 | Freq: Every day | NASAL | Status: DC

## 2011-12-19 MED ORDER — ALBUTEROL SULFATE HFA 108 (90 BASE) MCG/ACT IN AERS: 2.0000 | INHALATION_SPRAY | Freq: Four times a day (QID) | RESPIRATORY_TRACT | Status: DC | PRN

## 2011-12-19 NOTE — Assessment & Plan Note (Signed)
2 week h/o erythema and rash on right cheek, also inflammation of left cheek, antibiotics prescribed, and derm referral

## 2011-12-19 NOTE — Assessment & Plan Note (Signed)
Low fat diet discussed and encouraged, updated labs neded

## 2011-12-19 NOTE — Assessment & Plan Note (Signed)
Increased pain and spasm x 1 week, toradol in office, continue muscle relaxant

## 2011-12-19 NOTE — Patient Instructions (Addendum)
F/U in 5 month  Fasting labs in 5 month just before follow up.  Toradol 60mg  iM today for arthritis   Celebrex sent in use if needed for arthritis pain  You are refered to dermatology about the rash on your face.

## 2011-12-19 NOTE — Assessment & Plan Note (Signed)
Controlled, no change in medication  

## 2011-12-19 NOTE — Assessment & Plan Note (Signed)
C/o nocturnal cough esp when she gets hot, n sputum or fever, rquests med for this

## 2011-12-19 NOTE — Assessment & Plan Note (Signed)
Rash on face x 2 weeks derm eval

## 2011-12-19 NOTE — Progress Notes (Signed)
  Subjective:    Patient ID: Toni Miller, female    DOB: [birth year], 51 y.o.   MRN: [id number]  HPI The PT is here for follow up and re-evaluation of chronic medical conditions, medication management and review of any available recent lab and radiology data.  Preventive health is updated, specifically  Cancer screening and Immunization.   Questions or concerns regarding consultations or procedures which the PT has had in the interim are  addressed. The PT denies any adverse reactions to current medications since the last visit.  2 week h/o dry scaly rash n left cheek used hydrocortisone which has bleached the skin, also red rash on left cheek Increased back and bilateral hip pain, wants celebrex and injection in the office C/o nocturnal cough, when she gets very hot, requests cough med for this      Review of Systems See HPI Denies recent fever or chills. Denies sinus pressure, nasal congestion, ear pain or sore throat. Denies chest congestion, productive cough or wheezing. Denies chest pains, palpitations and leg swelling Denies abdominal pain, nausea, vomiting,diarrhea or constipation.   Denies dysuria, frequency, hesitancy or incontinence.  Denies headaches, seizures, numbness, or tingling. Denies depression, anxiety or insomnia.       Objective:   Physical Exam  Patient alert and oriented and in no cardiopulmonary distress.  HEENT: No facial asymmetry, EOMI, no sinus tenderness,  oropharynx pink and moist.  Neck decreased ROM with spasm, no adenopathy.  Chest: Clear to auscultation bilaterally.  CVS: S1, S2 no murmurs, no S3.  ABD: Soft non tender. Bowel sounds normal.  Ext: No edema  MS: Decreased  ROM spine,adequate  In  shoulders, hips and knees  Skin: Intact, no ulcerations or rash noted.  Psych: Good eye contact, normal affect. Memory intact not anxious or depressed appearing.  CNS: CN 2-12 intact, power, tone and sensation normal throughout.         Assessment & Plan:

## 2012-01-17 ENCOUNTER — Other Ambulatory Visit: Payer: Self-pay | Admitting: Family Medicine

## 2012-02-01 ENCOUNTER — Other Ambulatory Visit: Payer: Self-pay | Admitting: Family Medicine

## 2012-03-27 ENCOUNTER — Other Ambulatory Visit: Payer: Self-pay | Admitting: Family Medicine

## 2012-04-03 ENCOUNTER — Emergency Department (HOSPITAL_COMMUNITY)
Admission: EM | Admit: 2012-04-03 | Discharge: 2012-04-03 | Disposition: A | Discharge status: Home / Self Care | Payer: PRIVATE HEALTH INSURANCE | Attending: Emergency Medicine | Admitting: Emergency Medicine

## 2012-04-03 ENCOUNTER — Encounter (HOSPITAL_COMMUNITY): Payer: Self-pay

## 2012-04-03 DIAGNOSIS — E785 Hyperlipidemia, unspecified: Secondary | ICD-10-CM | POA: Insufficient documentation

## 2012-04-03 DIAGNOSIS — I10 Essential (primary) hypertension: Secondary | ICD-10-CM | POA: Insufficient documentation

## 2012-04-03 DIAGNOSIS — Z87891 Personal history of nicotine dependence: Secondary | ICD-10-CM | POA: Insufficient documentation

## 2012-04-03 DIAGNOSIS — Z79899 Other long term (current) drug therapy: Secondary | ICD-10-CM | POA: Insufficient documentation

## 2012-04-03 DIAGNOSIS — K219 Gastro-esophageal reflux disease without esophagitis: Secondary | ICD-10-CM | POA: Insufficient documentation

## 2012-04-03 DIAGNOSIS — K625 Hemorrhage of anus and rectum: Secondary | ICD-10-CM | POA: Insufficient documentation

## 2012-04-03 LAB — CBC
MCH: 30.2 pg (ref 26.0–34.0)
Platelets: 229 10*3/uL (ref 150–400)
RBC: 4.3 MIL/uL (ref 3.87–5.11)
WBC: 7.3 10*3/uL (ref 4.0–10.5)

## 2012-04-03 LAB — URINALYSIS, ROUTINE W REFLEX MICROSCOPIC
Bilirubin Urine: NEGATIVE
Nitrite: NEGATIVE
Specific Gravity, Urine: <1.005 — ABNORMAL LOW (ref 1.005–1.030)
Urobilinogen, UA: 0.2 mg/dL (ref 0.0–1.0)

## 2012-04-03 NOTE — Discharge Instructions (Signed)
Your blood counts are normal here tonight. The most likely cause of the blood after a large bowel movement is from the stretching of the anus as the stool passes. If you develop more bleeding that happens each time you go to the bathroom, follow up with your doctor or return to the ER.   Rectal Bleeding  Rectal bleeding is when blood comes out of the opening of the butt (anus). Rectal bleeding may show up as bright red blood or really dark poop (stool). The poop may look dark red, maroon, or black. Rectal bleeding is often a sign that something is wrong. This needs to be checked by a doctor.  HOME CARE  Eat a diet high in fiber. This will help keep your poop soft.   Limit activitiy.   Drink enough fluids to keep your pee (urine) clear or pale yellow.   Take a warm bath to soothe any pain.   Follow up with your doctor as told.  GET HELP RIGHT AWAY IF:  You have more bleeding.   You have black or dark red poop.   You throw up (vomit) blood or it looks like coffee grounds.   You have belly (abdominal) pain or tenderness.   You have a fever.   You feel weak, sick to your stomach (nauseous), or you pass out (faint).   You have pain that is so bad you cannot poop (bowel movement).  MAKE SURE YOU:  Understand these instructions.   Will watch your condition.   Will get help right away if you are not doing well or get worse.  Document Released: 07/06/2011 Document Revised: 10/13/2011 Document Reviewed: 07/06/2011 [facility] Patient Information 2012 [place], [place].

## 2012-04-03 NOTE — ED Provider Notes (Signed)
History     CSN: [id number]  Arrival date & time 04/03/12  0014   First MD Initiated Contact with Patient 04/03/12 0125      Chief Complaint  Patient presents with  . Rectal Bleeding    (Consider location/radiation/quality/duration/timing/severity/associated sxs/prior treatment) HPI  Toni Miller is a 51 y.o. female who presents to the Emergency Department complaining of rectal bleeding with a large stool earlier in the evening. She states she had a large volume stool and had bright red blood on the tissue when she wiped. She denies straining to have the bowel movement. Denies nausea, vomiting, rectal pain, hemorrhoids. She has only had the one epsode.  PCP Dr. Lodema Hong   Past Medical History  Diagnosis Date  . GERD (gastroesophageal reflux disease)   . Glaucoma   . Hyperlipidemia   . Hypertension   . Neck pain 7/09    WITH BULGING DISC-----RECIEVING EPIDURALS   . Sinusitis     Past Surgical History  Procedure Date  . Tubal ligation 1991  . Bilateral eye surgery     for glaucoma  . Total abdominal hysterectomy w/ bilateral salpingoophorectomy 2007    [facility]  . Cyst removed left breast /benign 2005    left, [facility]  . Abdominal exploration surgery age 27    bowel obstruction, [facility]  . Breast surgery 2004    left partial mastectomy, [facility]  . Abdominal hysterectomy 2007    [facility], [facility]  . Cataract extraction w/phaco 05/16/2011    Procedure: CATARACT EXTRACTION PHACO AND INTRAOCULAR LENS PLACEMENT (IOC);  Surgeon: Gemma Payor;  Location: [facility];  Service: Ophthalmology;  Laterality: Right;    Family History  Problem Relation Age of Onset  . Alcohol abuse Mother   . Cancer Mother   . Lung cancer Father   . Glaucoma Father   . Cancer Father   . Breast cancer Sister   . Cancer Sister   . Cancer Sister   . Heart disease Sister     History  Substance Use Topics  . Smoking status: Former Smoker    Quit date: 05/12/1991  . Smokeless tobacco: Never Used  . Alcohol Use:  No    OB History    Grav Para Term Preterm Abortions TAB SAB Ect Mult Living                  Review of Systems  Constitutional: Negative for fever.       10 Systems reviewed and are negative for acute change except as noted in the HPI.  HENT: Negative for congestion.   Eyes: Negative for discharge and redness.  Respiratory: Negative for cough and shortness of breath.   Cardiovascular: Negative for chest pain.  Gastrointestinal: Positive for blood in stool. Negative for vomiting and abdominal pain.  Musculoskeletal: Negative for back pain.  Skin: Negative for rash.  Neurological: Negative for syncope, numbness and headaches.  Psychiatric/Behavioral:       No behavior change.    Allergies  Review of patient's allergies indicates no known allergies.  Home Medications   Current Outpatient Rx  Name Route Sig Dispense Refill  . ALBUTEROL SULFATE HFA 108 (90 BASE) MCG/ACT IN AERS Inhalation Inhale 2 puffs into the lungs every 6 (six) hours as needed. For shortness of breath 1 Inhaler 3  . BENAZEPRIL HCL 10 MG PO TABS Oral Take 1 tablet (10 mg total) by mouth daily. 30 tablet 3  . CELECOXIB 200 MG PO CAPS  One tablet once  daily, as needed for uncontrolled arthritic pain 30 capsule 3  . CYCLOSPORINE 0.05 % OP EMUL  1 drop 2 (two) times daily.    Marland Kitchen DOCUSATE SODIUM 100 MG PO CAPS Oral Take 100 mg by mouth daily as needed.    Marland Kitchen ESOMEPRAZOLE MAGNESIUM 40 MG PO CPDR Oral Take 1 capsule (40 mg total) by mouth daily. 90 capsule 1  . FLEXERIL 10 MG PO TABS  TAKE (1) TABLET BY MOUTH (3) TIMES DAILY AS NEEDED. (BEDTIME USE) 30 each 0  . FLUTICASONE PROPIONATE 50 MCG/ACT NA SUSP Nasal Place 2 sprays into the nose daily. 30 g 3  . GATIFLOXACIN 0.3 % OP SOLN Right Eye Place 1 drop into the right eye 4 (four) times daily.      Marland Kitchen HYDROXYZINE HCL 10 MG PO TABS Oral Take 10 mg by mouth 3 (three) times daily as needed.    . IBUPROFEN 800 MG PO TABS Oral Take 1 tablet (800 mg total) by mouth every 8  (eight) hours as needed. 30 tablet 3  . KETOROLAC TROMETHAMINE 0.5 % OP SOLN Right Eye Place 1 drop into the right eye 2 (two) times daily.      Marland Kitchen MAXZIDE-25 37.5-25 MG PO TABS  TAKE 1 TABLET BY MOUTH ONCE A DAY. 30 each 3  . POLYETHYLENE GLYCOL 3350 PO PACK Oral Take 17 g by mouth daily.      Marland Kitchen PREDNISOLONE ACETATE 1 % OP SUSP Right Eye Place 1 drop into the right eye 4 (four) times daily.      Marland Kitchen PREDNISONE (PAK) 5 MG PO TABS Oral Take 1 tablet (5 mg total) by mouth as directed. 21 tablet 0  . ROSUVASTATIN CALCIUM 10 MG PO TABS Oral Take 1 tablet (10 mg total) by mouth daily. 90 tablet 1  . SINGULAIR 10 MG PO TABS  TAKE 1 TABLET BY MOUTH ONCE DAILY. 90 each 0  . TRAVOPROST 0.004 % OP SOLN Both Eyes Place 1 drop into both eyes at bedtime.      . VENLAFAXINE HCL 37.5 MG PO TABS Oral Take 1 tablet (37.5 mg total) by mouth daily. 90 tablet 1    BP 138/82  Pulse 97  Temp(Src) 97.9 F (36.6 C) (Oral)  Resp 20  Ht 5\' 1"  (1.549 m)  Wt 118 lb (53.524 kg)  BMI 22.30 kg/m2  SpO2 100%  Physical Exam  Nursing note and vitals reviewed. Constitutional:       Awake, alert, nontoxic appearance.  HENT:  Head: Atraumatic.  Eyes: Right eye exhibits no discharge. Left eye exhibits no discharge.  Neck: Neck supple.  Pulmonary/Chest: Effort normal. She exhibits no tenderness.  Abdominal: Soft. There is no tenderness. There is no rebound.  Genitourinary: Guaiac positive stool.       Stool brown, small amount of blood in rectal vault.  Musculoskeletal: She exhibits no tenderness.       Baseline ROM, no obvious new focal weakness.  Neurological:       Mental status and motor strength appears baseline for patient and situation.  Skin: No rash noted.  Psychiatric: She has a normal mood and affect.    ED Course  Procedures (including critical care time) Results for orders placed during the hospital encounter of 04/03/12  URINALYSIS, ROUTINE W REFLEX MICROSCOPIC      Component Value Range   Color,  Urine STRAW (*) YELLOW    APPearance CLEAR  CLEAR    Specific Gravity, Urine <1.005 (*) 1.005 - 1.030  pH 6.5  5.0 - 8.0    Glucose, UA NEGATIVE  NEGATIVE (mg/dL)   Hgb urine dipstick NEGATIVE  NEGATIVE    Bilirubin Urine NEGATIVE  NEGATIVE    Ketones, ur NEGATIVE  NEGATIVE (mg/dL)   Protein, ur NEGATIVE  NEGATIVE (mg/dL)   Urobilinogen, UA 0.2  0.0 - 1.0 (mg/dL)   Nitrite NEGATIVE  NEGATIVE    Leukocytes, UA NEGATIVE  NEGATIVE   CBC      Component Value Range   WBC 7.3  4.0 - 10.5 (K/uL)   RBC 4.30  3.87 - 5.11 (MIL/uL)   Hemoglobin 13.0  12.0 - 15.0 (g/dL)   HCT 16.1  09.6 - 04.5 (%)   MCV 84.4  78.0 - 100.0 (fL)   MCH 30.2  26.0 - 34.0 (pg)   MCHC 35.8  30.0 - 36.0 (g/dL)   RDW 40.9  81.1 - 91.4 (%)   Platelets 229  150 - 400 (K/uL)       MDM  Patient presents with rectal bleeding after a large bowel movement. She denies any abdominal or rectal pain. Stool is brown, small amount of blood in the rectal vault. Hemoglobin is stable.  Vital signs are stable. Followup with her primary care physician has been encouraged.Pt stable in ED with no significant deterioration in condition.The patient appears reasonably screened and/or stabilized for discharge and I doubt any other medical condition or other [facility] requiring further screening, evaluation, or treatment in the ED at this time prior to discharge.  MDM Reviewed: nursing note and vitals Interpretation: labs           Nicoletta Dress. Colon Branch, MD 04/04/12 0430

## 2012-04-03 NOTE — ED Notes (Signed)
Pt c/o rectal bleeding that started tonight. Bright red blood when wiping

## 2012-05-09 ENCOUNTER — Encounter: Payer: Self-pay | Admitting: Family Medicine

## 2012-05-09 ENCOUNTER — Telehealth: Payer: Self-pay | Admitting: Family Medicine

## 2012-05-09 ENCOUNTER — Ambulatory Visit (INDEPENDENT_AMBULATORY_CARE_PROVIDER_SITE_OTHER): Payer: PRIVATE HEALTH INSURANCE | Admitting: Family Medicine

## 2012-05-09 VITALS — BP 116/74 | HR 97 | Resp 18 | Ht 61.0 in | Wt 127.1 lb

## 2012-05-09 DIAGNOSIS — Z1211 Encounter for screening for malignant neoplasm of colon: Secondary | ICD-10-CM

## 2012-05-09 DIAGNOSIS — D1779 Benign lipomatous neoplasm of other sites: Secondary | ICD-10-CM

## 2012-05-09 DIAGNOSIS — K219 Gastro-esophageal reflux disease without esophagitis: Secondary | ICD-10-CM

## 2012-05-09 DIAGNOSIS — I10 Essential (primary) hypertension: Secondary | ICD-10-CM

## 2012-05-09 DIAGNOSIS — D171 Benign lipomatous neoplasm of skin and subcutaneous tissue of trunk: Secondary | ICD-10-CM

## 2012-05-09 DIAGNOSIS — E785 Hyperlipidemia, unspecified: Secondary | ICD-10-CM

## 2012-05-09 LAB — LIPID PANEL
Cholesterol: 169 mg/dL (ref 0–200)
Total CHOL/HDL Ratio: 2.4 Ratio
Triglycerides: 82 mg/dL (ref <150)
VLDL: 16 mg/dL (ref 0–40)

## 2012-05-09 LAB — BASIC METABOLIC PANEL
Calcium: 9.5 mg/dL (ref 8.4–10.5)
Creat: 0.87 mg/dL (ref 0.50–1.10)

## 2012-05-09 LAB — CBC WITH DIFFERENTIAL/PLATELET
Hemoglobin: 14 g/dL (ref 12.0–15.0)
Lymphs Abs: 2.3 10*3/uL (ref 0.7–4.0)
Monocytes Relative: 8 % (ref 3–12)
Neutro Abs: 3.3 10*3/uL (ref 1.7–7.7)
Neutrophils Relative %: 52 % (ref 43–77)
RBC: 4.68 MIL/uL (ref 3.87–5.11)
WBC: 6.2 10*3/uL (ref 4.0–10.5)

## 2012-05-09 LAB — POC HEMOCCULT BLD/STL (OFFICE/1-CARD/DIAGNOSTIC): Fecal Occult Blood, POC: NEGATIVE

## 2012-05-09 NOTE — Patient Instructions (Addendum)
F/u in January, please call if you need me before.  Please call in September mid to end for your flu vaccine   Rectal exam today.  Labs and blood pressure are excellent.  You will get two tylenol tablets in the office today for pain over your right hip.   You are referred for an ultrasound of the swelling on your upper back, if this is confirmed as a lipoma I will then refer you to surgeon since it is bothering you  Fasting lipid and cmp mid January before visit

## 2012-05-09 NOTE — Progress Notes (Signed)
  Subjective:    Patient ID: Toni Miller, female    DOB: 29-Jun-1961, 51 y.o.   MRN: [id number]  HPI Right upper posterior chest swelling which is uncomfortable and increasing in sier in the past 4 month, no limitation in neck movement. In May had BRRB after passing a large amount of stool, she was constipated. Sore in right lower abdomen x 2 days, bowels are now regularr, has BM twice daily Immunization and cancer screening updated Review of Systems See HPI Denies recent fever or chills. Denies sinus pressure, nasal congestion, ear pain or sore throat. Denies chest congestion, productive cough or wheezing. Denies chest pains, palpitations and leg swelling   Denies dysuria, frequency, hesitancy or incontinence. Denies joint pain, swelling and limitation in mobility. Denies headaches, seizures, numbness, or tingling. Denies depression, anxiety or insomnia. Denies skin break down or rash.        Objective:   Physical Exam Patient alert and oriented and in no cardiopulmonary distress.  HEENT: No facial asymmetry, EOMI, no sinus tenderness,  oropharynx pink and moist.  Neck supple no adenopathy.  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  ABD: Soft non tender. Bowel sounds normal.No organomegaly or masses  Ext: No edema  MS: Adequate ROM spine, shoulders, and knees.slightly limited in right hip due to pain  Skin: Intact, no ulcerations or rash noted.  Psych: Good eye contact, normal affect. Memory intact not anxious or depressed appearing.  CNS: CN 2-12 intact, power, tone and sensation normal throughout.        Assessment & Plan:

## 2012-05-09 NOTE — Addendum Note (Signed)
Addended by: Kandis Fantasia B on: 05/09/2012 11:16 AM   Modules accepted: Orders

## 2012-05-09 NOTE — Assessment & Plan Note (Signed)
Controlled, no change in medication  

## 2012-05-09 NOTE — Assessment & Plan Note (Signed)
Ultrasound of structure then surgical evaluation

## 2012-05-11 ENCOUNTER — Ambulatory Visit (HOSPITAL_COMMUNITY)
Admission: RE | Admit: 2012-05-11 | Discharge: 2012-05-11 | Disposition: A | Discharge status: Home / Self Care | Payer: PRIVATE HEALTH INSURANCE | Source: Ambulatory Visit | Attending: Family Medicine | Admitting: Family Medicine

## 2012-05-11 ENCOUNTER — Other Ambulatory Visit: Payer: Self-pay | Admitting: Family Medicine

## 2012-05-11 DIAGNOSIS — D171 Benign lipomatous neoplasm of skin and subcutaneous tissue of trunk: Secondary | ICD-10-CM

## 2012-05-11 DIAGNOSIS — D1779 Benign lipomatous neoplasm of other sites: Secondary | ICD-10-CM | POA: Insufficient documentation

## 2012-05-17 ENCOUNTER — Ambulatory Visit: Payer: PRIVATE HEALTH INSURANCE | Admitting: Family Medicine

## 2012-05-19 ENCOUNTER — Other Ambulatory Visit: Payer: Self-pay | Admitting: Family Medicine

## 2012-05-30 NOTE — Telephone Encounter (Signed)
Patient is aware 

## 2012-05-31 ENCOUNTER — Other Ambulatory Visit: Payer: Self-pay | Admitting: Family Medicine

## 2012-06-14 ENCOUNTER — Other Ambulatory Visit: Payer: Self-pay | Admitting: Family Medicine

## 2012-06-26 ENCOUNTER — Other Ambulatory Visit: Payer: Self-pay | Admitting: Family Medicine

## 2012-07-04 ENCOUNTER — Other Ambulatory Visit: Payer: Self-pay | Admitting: Family Medicine

## 2012-07-04 DIAGNOSIS — Z139 Encounter for screening, unspecified: Secondary | ICD-10-CM

## 2012-07-16 ENCOUNTER — Other Ambulatory Visit: Payer: Self-pay | Admitting: Family Medicine

## 2012-07-26 ENCOUNTER — Ambulatory Visit (INDEPENDENT_AMBULATORY_CARE_PROVIDER_SITE_OTHER): Payer: PRIVATE HEALTH INSURANCE | Admitting: Family Medicine

## 2012-07-26 ENCOUNTER — Encounter: Payer: Self-pay | Admitting: Family Medicine

## 2012-07-26 VITALS — BP 116/80 | HR 94 | Resp 18 | Ht 61.0 in | Wt 122.1 lb

## 2012-07-26 DIAGNOSIS — E785 Hyperlipidemia, unspecified: Secondary | ICD-10-CM

## 2012-07-26 DIAGNOSIS — I10 Essential (primary) hypertension: Secondary | ICD-10-CM

## 2012-07-26 DIAGNOSIS — Z23 Encounter for immunization: Secondary | ICD-10-CM

## 2012-07-26 DIAGNOSIS — R350 Frequency of micturition: Secondary | ICD-10-CM

## 2012-07-26 MED ORDER — CIPROFLOXACIN HCL 500 MG PO TABS: 500.0000 mg | ORAL_TABLET | Freq: Two times a day (BID) | ORAL | Status: AC

## 2012-07-26 MED ORDER — FLUCONAZOLE 150 MG PO TABS: ORAL_TABLET | ORAL | Status: DC

## 2012-07-26 NOTE — Patient Instructions (Addendum)
F/u as before  Flu vaccine today  You are being treated for presumed UTI, if you continue to have symptoms pls call and let me know

## 2012-07-26 NOTE — Progress Notes (Signed)
  Subjective:    Patient ID: Toni Miller, female    DOB: 11-01-1961, 51 y.o.   MRN: [id number]  HPI 1 week h/o urinary frequency and urgency, has to get up at night sometimes up to 4 times in the night to go to the bathroom. Suprapubic pain, no flank pain fever or chills.States at other times she has severe difficulty voiding, has seen urology in the distant past   Review of Systems See HPI Denies recent fever or chills. Denies sinus pressure, nasal congestion, ear pain or sore throat. Denies chest congestion, productive cough or wheezing. Denies chest pains, palpitations and leg swelling Denies abdominal pain, nausea, vomiting,diarrhea or constipation.    Denies joint pain, swelling and limitation in mobility. Denies headaches, seizures, numbness, or tingling. Denies depression, anxiety or insomnia. Denies skin break down or rash.        Objective:   Physical Exam  Patient alert and oriented and in no cardiopulmonary distress.  HEENT: No facial asymmetry, EOMI, no sinus tenderness,  oropharynx pink and moist.  Neck supple no adenopathy.  Chest: Clear to auscultation bilaterally.  CVS: S1, S2 no murmurs, no S3.  ABD: Soft suprapubic tenderness, no renal angle tenderness. Bowel sounds normal.  Ext: No edema  MS: Adequate ROM spine, shoulders, hips and knees.  Skin: Intact, no ulcerations or rash noted.  Psych: Good eye contact, normal affect. Memory intact not anxious or depressed appearing.  CNS: CN 2-12 intact, power, tone and sensation normal throughout.       Assessment & Plan:

## 2012-07-27 ENCOUNTER — Telehealth: Payer: Self-pay | Admitting: Family Medicine

## 2012-07-27 LAB — POCT URINALYSIS DIPSTICK
Blood, UA: NEGATIVE
Glucose, UA: NEGATIVE
Nitrite, UA: NEGATIVE
Urobilinogen, UA: 0.2
pH, UA: 7.5

## 2012-07-27 NOTE — Telephone Encounter (Signed)
Toni Miller has the rxs patient called back

## 2012-07-27 NOTE — Telephone Encounter (Signed)
Noted  

## 2012-07-28 NOTE — Assessment & Plan Note (Signed)
Hyperlipidemia:Low fat diet discussed and encouraged.  Controlled, no change in medication   

## 2012-07-28 NOTE — Assessment & Plan Note (Signed)
Controlled, no change in medication DASH diet and commitment to daily physical activity for a minimum of 30 minutes discussed and encouraged, as a part of hypertension management. The importance of attaining a healthy weight is also discussed.  

## 2012-07-28 NOTE — Assessment & Plan Note (Signed)
Very symptomatic for acute UTI despite negative Ua, will treat presumptively, pt to call if symptoms persist or worsen

## 2012-08-03 ENCOUNTER — Other Ambulatory Visit: Payer: Self-pay | Admitting: Family Medicine

## 2012-08-06 ENCOUNTER — Ambulatory Visit (HOSPITAL_COMMUNITY)
Admission: RE | Admit: 2012-08-06 | Discharge: 2012-08-06 | Disposition: A | Discharge status: Home / Self Care | Payer: PRIVATE HEALTH INSURANCE | Source: Ambulatory Visit | Attending: Family Medicine | Admitting: Family Medicine

## 2012-08-06 DIAGNOSIS — Z1231 Encounter for screening mammogram for malignant neoplasm of breast: Secondary | ICD-10-CM | POA: Insufficient documentation

## 2012-08-06 DIAGNOSIS — Z139 Encounter for screening, unspecified: Secondary | ICD-10-CM

## 2012-08-20 ENCOUNTER — Encounter: Payer: Self-pay | Admitting: Family Medicine

## 2012-08-20 ENCOUNTER — Ambulatory Visit (INDEPENDENT_AMBULATORY_CARE_PROVIDER_SITE_OTHER): Payer: PRIVATE HEALTH INSURANCE | Admitting: Family Medicine

## 2012-08-20 VITALS — BP 128/76 | HR 86 | Resp 18 | Ht 61.0 in | Wt 132.0 lb

## 2012-08-20 DIAGNOSIS — M542 Cervicalgia: Secondary | ICD-10-CM

## 2012-08-20 MED ORDER — NAPROXEN 500 MG PO TABS: 500.0000 mg | ORAL_TABLET | Freq: Two times a day (BID) | ORAL | Status: AC

## 2012-08-20 NOTE — Progress Notes (Signed)
  Subjective:    Patient ID: Toni Miller, female    DOB: 04/23/1961, 51 y.o.   MRN: [id number]  HPI  Neck pain and swelling x 3-4 days after waking up, history of chronic neck pain, no new injury, mild tingling in fingers that is unchanged Has tried her flexeril at bedtime but this is not helping  Review of Systems  GEN- denies fatigue, fever, weight loss,weakness, recent illness HEENT- denies eye drainage, change in vision, nasal discharge, CVS- denies chest pain, palpitations RESP- denies SOB, cough, wheeze MSK- + joint pain, muscle aches, injury Neuro- denies headache, dizziness, syncope, seizure activity      Objective:   Physical Exam GEN- NAD, alert and oriented x3 HEENT- PERRL, EOMI, non injected sclera, pink conjunctiva, MMM, oropharynx clear Neck- Supple,decreased ROM due to pain and stiffness mild swelling in trapezius but symmetrical CVS- RRR, no murmur RESP-CTAB MSK- Rotator cuff in tact, biceps in tact, spine NT Radial 2+         Assessment & Plan:

## 2012-08-20 NOTE — Patient Instructions (Addendum)
Get a new pillow Take 1/2 tablet of muscle relaxant in the morning and 1 tablet in the evening Anti-inflammatory twice a day with food  Keep previous f/u appt with Dr. Lodema Hong

## 2012-08-22 ENCOUNTER — Encounter: Payer: Self-pay | Admitting: Family Medicine

## 2012-08-22 NOTE — Assessment & Plan Note (Signed)
Acute pain, spasm noted, increase muscle relaxant in AM, add anti-inflammatories,heating pad.

## 2012-09-05 ENCOUNTER — Other Ambulatory Visit: Payer: Self-pay | Admitting: Family Medicine

## 2012-10-10 ENCOUNTER — Other Ambulatory Visit: Payer: Self-pay

## 2012-10-10 MED ORDER — BENAZEPRIL HCL 10 MG PO TABS: 10.0000 mg | ORAL_TABLET | Freq: Every day | ORAL | Status: DC

## 2012-10-15 ENCOUNTER — Other Ambulatory Visit: Payer: Self-pay

## 2012-10-15 MED ORDER — ESOMEPRAZOLE MAGNESIUM 40 MG PO CPDR: 40.0000 mg | DELAYED_RELEASE_CAPSULE | Freq: Every day | ORAL | Status: DC

## 2012-10-30 ENCOUNTER — Telehealth: Payer: Self-pay | Admitting: Family Medicine

## 2012-10-30 NOTE — Telephone Encounter (Signed)
Patient aware that she should contact pharmacy due to med being filled on 12/4

## 2012-11-13 ENCOUNTER — Other Ambulatory Visit: Payer: Self-pay | Admitting: Family Medicine

## 2012-11-14 ENCOUNTER — Other Ambulatory Visit: Payer: Self-pay

## 2012-11-14 DIAGNOSIS — M1990 Unspecified osteoarthritis, unspecified site: Secondary | ICD-10-CM

## 2012-11-14 MED ORDER — CELECOXIB 200 MG PO CAPS: ORAL_CAPSULE | ORAL | Status: AC

## 2012-11-15 ENCOUNTER — Other Ambulatory Visit: Payer: Self-pay | Admitting: Family Medicine

## 2012-11-16 LAB — COMPREHENSIVE METABOLIC PANEL
AST: 22 U/L (ref 0–37)
Alkaline Phosphatase: 73 U/L (ref 39–117)
BUN: 17 mg/dL (ref 6–23)
Creat: 0.88 mg/dL (ref 0.50–1.10)
Potassium: 3.9 mEq/L (ref 3.5–5.3)

## 2012-11-16 LAB — LIPID PANEL
HDL: 73 mg/dL (ref >39)
LDL Cholesterol: 111 mg/dL — ABNORMAL HIGH (ref 0–99)
Total CHOL/HDL Ratio: 2.7 Ratio

## 2012-11-30 ENCOUNTER — Other Ambulatory Visit: Payer: Self-pay

## 2012-11-30 MED ORDER — TRIAMTERENE-HCTZ 37.5-25 MG PO TABS: 1.0000 | ORAL_TABLET | Freq: Every day | ORAL | Status: DC

## 2012-11-30 MED ORDER — ESOMEPRAZOLE MAGNESIUM 40 MG PO CPDR: 40.0000 mg | DELAYED_RELEASE_CAPSULE | Freq: Every day | ORAL | Status: DC

## 2012-11-30 MED ORDER — BENAZEPRIL HCL 10 MG PO TABS: 10.0000 mg | ORAL_TABLET | Freq: Every day | ORAL | Status: DC

## 2012-11-30 MED ORDER — MONTELUKAST SODIUM 10 MG PO TABS: 10.0000 mg | ORAL_TABLET | Freq: Every day | ORAL | Status: DC

## 2012-12-19 ENCOUNTER — Other Ambulatory Visit: Payer: Self-pay

## 2012-12-19 ENCOUNTER — Encounter: Payer: Self-pay | Admitting: Family Medicine

## 2012-12-19 ENCOUNTER — Ambulatory Visit (INDEPENDENT_AMBULATORY_CARE_PROVIDER_SITE_OTHER): Payer: PRIVATE HEALTH INSURANCE | Admitting: Family Medicine

## 2012-12-19 VITALS — BP 118/80 | HR 99 | Resp 16 | Ht 61.0 in | Wt 130.0 lb

## 2012-12-19 DIAGNOSIS — I10 Essential (primary) hypertension: Secondary | ICD-10-CM

## 2012-12-19 DIAGNOSIS — K219 Gastro-esophageal reflux disease without esophagitis: Secondary | ICD-10-CM

## 2012-12-19 DIAGNOSIS — J301 Allergic rhinitis due to pollen: Secondary | ICD-10-CM

## 2012-12-19 DIAGNOSIS — R059 Cough, unspecified: Secondary | ICD-10-CM | POA: Insufficient documentation

## 2012-12-19 DIAGNOSIS — K5909 Other constipation: Secondary | ICD-10-CM

## 2012-12-19 DIAGNOSIS — R05 Cough: Secondary | ICD-10-CM

## 2012-12-19 DIAGNOSIS — E785 Hyperlipidemia, unspecified: Secondary | ICD-10-CM

## 2012-12-19 DIAGNOSIS — M542 Cervicalgia: Secondary | ICD-10-CM

## 2012-12-19 DIAGNOSIS — K5900 Constipation, unspecified: Secondary | ICD-10-CM

## 2012-12-19 MED ORDER — BENZONATATE 100 MG PO CAPS: 100.0000 mg | ORAL_CAPSULE | Freq: Four times a day (QID) | ORAL | Status: DC | PRN

## 2012-12-19 MED ORDER — VENLAFAXINE HCL 37.5 MG PO TABS: 37.5000 mg | ORAL_TABLET | Freq: Every day | ORAL | Status: DC

## 2012-12-19 MED ORDER — TIZANIDINE HCL 4 MG PO TABS: ORAL_TABLET | ORAL | Status: AC

## 2012-12-19 NOTE — Assessment & Plan Note (Signed)
Controlled, no change in medication  

## 2012-12-19 NOTE — Progress Notes (Signed)
  Subjective:    Patient ID: Toni Miller, female    DOB: [birth year], 52 y.o.   MRN: [id number]  HPI The PT is here for follow up and re-evaluation of chronic medical conditions, medication management and review of any available recent lab and radiology data.  Preventive health is updated, specifically  Cancer screening and Immunization.   Questions or concerns regarding consultations or procedures which the PT has had in the interim are  addressed. The PT denies any adverse reactions to current medications since the last visit.  C/o cough and increased sinus drainage esp at night, no fever, chills , sputum is clear, no nasal drainage, ear pain or sore throatThere are no specific complaints  Needs new muscle relaxant , flexeril not covered, has chronic neck spasm      Review of Systems See HPI Denies recent fever or chills.  Denies  wheezing. Denies chest pains, palpitations and leg swelling Denies abdominal pain, nausea, vomiting,diarrhea or constipation.   Denies dysuria, frequency, hesitancy or incontinence. Neck and shoulder pain with spasm.  Denies headaches, seizures, numbness, or tingling. Denies depression, anxiety or insomnia. Denies skin break down or rash.        Objective:   Physical Exam  Patient alert and oriented and in no cardiopulmonary distress.  HEENT: No facial asymmetry, EOMI, no sinus tenderness,  oropharynx pink and moist.  Neck decreased ROM with right trapezius spasm, no adenopathy.  Chest: Clear to auscultation bilaterally.  CVS: S1, S2 no murmurs, no S3.  ABD: Soft non tender. Bowel sounds normal.  Ext: No edema  MS: Adequate ROM thoraco lumbar  spine, shoulders, hips and knees.  Skin: Intact, no ulcerations or rash noted.  Psych: Good eye contact, normal affect. Memory intact not anxious or depressed appearing.  CNS: CN 2-12 intact, power, tone and sensation normal throughout.       Assessment & Plan:

## 2012-12-19 NOTE — Assessment & Plan Note (Signed)
Uncontrolled , increased post nasal drainage with cough, use sudafed , as needed

## 2012-12-19 NOTE — Assessment & Plan Note (Signed)
Due to excessive post nasal drainage , tessalon perles prescribed

## 2012-12-19 NOTE — Patient Instructions (Addendum)
Pelvic and breast in 4 month, call if you need me before  Decongestant and allergy nasal spray are sent in.  New muscle relaxant sent in   It is important that you exercise regularly at least 30 minutes 5 times a week. If you develop chest pain, have severe difficulty breathing, or feel very tired, stop exercising immediately and seek medical attention    Cut back on red meat and fried foods please  oK to use sudafed one daily for a few days if you have excessive nasal drainage

## 2012-12-19 NOTE — Assessment & Plan Note (Signed)
LDL elevated Hyperlipidemia:Low fat diet discussed and encouraged.  No med change

## 2012-12-19 NOTE — Assessment & Plan Note (Signed)
Controlled, no change in medication DASH diet and commitment to daily physical activity for a minimum of 30 minutes discussed and encouraged, as a part of hypertension management. The importance of attaining a healthy weight is also discussed.  

## 2012-12-19 NOTE — Assessment & Plan Note (Signed)
Uncontrolled with spasm, start zanaflex, pt not interested in therapy now

## 2013-01-03 ENCOUNTER — Other Ambulatory Visit: Payer: Self-pay

## 2013-01-03 MED ORDER — ALBUTEROL SULFATE HFA 108 (90 BASE) MCG/ACT IN AERS: 2.0000 | INHALATION_SPRAY | Freq: Four times a day (QID) | RESPIRATORY_TRACT | Status: DC | PRN

## 2013-01-21 ENCOUNTER — Other Ambulatory Visit: Payer: Self-pay | Admitting: Family Medicine

## 2013-03-04 ENCOUNTER — Other Ambulatory Visit: Payer: Self-pay | Admitting: Family Medicine

## 2013-04-04 ENCOUNTER — Other Ambulatory Visit: Payer: Self-pay | Admitting: Family Medicine

## 2013-04-05 ENCOUNTER — Telehealth: Payer: Self-pay | Admitting: Family Medicine

## 2013-04-05 NOTE — Telephone Encounter (Signed)
This was sent in yesterday

## 2013-04-19 ENCOUNTER — Other Ambulatory Visit (HOSPITAL_COMMUNITY)
Admission: RE | Admit: 2013-04-19 | Discharge: 2013-04-19 | Disposition: A | Discharge status: Home / Self Care | Payer: PRIVATE HEALTH INSURANCE | Source: Ambulatory Visit | Attending: Family Medicine | Admitting: Family Medicine

## 2013-04-19 ENCOUNTER — Ambulatory Visit (INDEPENDENT_AMBULATORY_CARE_PROVIDER_SITE_OTHER): Payer: PRIVATE HEALTH INSURANCE | Admitting: Family Medicine

## 2013-04-19 ENCOUNTER — Encounter: Payer: Self-pay | Admitting: Family Medicine

## 2013-04-19 VITALS — BP 120/82 | HR 71 | Resp 16 | Ht 61.0 in | Wt 123.1 lb

## 2013-04-19 DIAGNOSIS — IMO0002 Reserved for concepts with insufficient information to code with codable children: Secondary | ICD-10-CM

## 2013-04-19 DIAGNOSIS — Z1151 Encounter for screening for human papillomavirus (HPV): Secondary | ICD-10-CM | POA: Insufficient documentation

## 2013-04-19 DIAGNOSIS — Z1272 Encounter for screening for malignant neoplasm of vagina: Secondary | ICD-10-CM

## 2013-04-19 DIAGNOSIS — L259 Unspecified contact dermatitis, unspecified cause: Secondary | ICD-10-CM

## 2013-04-19 DIAGNOSIS — Z87898 Personal history of other specified conditions: Secondary | ICD-10-CM

## 2013-04-19 DIAGNOSIS — Z1211 Encounter for screening for malignant neoplasm of colon: Secondary | ICD-10-CM

## 2013-04-19 DIAGNOSIS — E785 Hyperlipidemia, unspecified: Secondary | ICD-10-CM

## 2013-04-19 DIAGNOSIS — H409 Unspecified glaucoma: Secondary | ICD-10-CM

## 2013-04-19 DIAGNOSIS — Z8742 Personal history of other diseases of the female genital tract: Secondary | ICD-10-CM

## 2013-04-19 DIAGNOSIS — E049 Nontoxic goiter, unspecified: Secondary | ICD-10-CM

## 2013-04-19 DIAGNOSIS — L309 Dermatitis, unspecified: Secondary | ICD-10-CM

## 2013-04-19 DIAGNOSIS — Z0000 Encounter for general adult medical examination without abnormal findings: Secondary | ICD-10-CM

## 2013-04-19 DIAGNOSIS — Z01419 Encounter for gynecological examination (general) (routine) without abnormal findings: Secondary | ICD-10-CM | POA: Insufficient documentation

## 2013-04-19 DIAGNOSIS — K649 Unspecified hemorrhoids: Secondary | ICD-10-CM

## 2013-04-19 MED ORDER — HYDROCORTISONE 1 % RE CREA: TOPICAL_CREAM | RECTAL | Status: DC

## 2013-04-19 MED ORDER — TIZANIDINE HCL 2 MG PO CAPS: ORAL_CAPSULE | ORAL | Status: DC

## 2013-04-19 MED ORDER — CLOBETASOL PROPIONATE 0.05 % EX CREA: TOPICAL_CREAM | CUTANEOUS | Status: DC

## 2013-04-19 NOTE — Patient Instructions (Addendum)
F/u in 4 month, call if you need me before  CBC, fasting lipid, cmp, TSH in 4 month, 3 to 5 days before visit.  Medication sent in for rash on arms , for low back spasm and for piles.  You are referred for screening colonoscopy  It is important that you exercise regularly at least 30 minutes 5 times a week. If you develop chest pain, have severe difficulty breathing, or feel very tired, stop exercising immediately and seek medical attention

## 2013-04-19 NOTE — Progress Notes (Signed)
  Subjective:    Patient ID: Toni Miller, female    DOB: June 01, 1961, 52 y.o.   MRN: [id number]  HPI The PT is here for pelvic and breast exam and re-evaluation of chronic medical conditions, medication management and review of any available recent lab and radiology data.  Preventive health is updated, specifically  Cancer screening and Immunization.    The PT denies any adverse reactions to current medications since the last visit.  C/o pruritic rash on both upper arms, no drainage from area, for the past 2 weeks    Review of Systems See HPI Denies recent fever or chills. Denies sinus pressure, nasal congestion, ear pain or sore throat. Denies chest congestion, productive cough or wheezing. Denies chest pains, palpitations and leg swelling Denies abdominal pain, nausea, vomiting,diarrhea or constipation.   Denies dysuria, frequency, hesitancy or incontinence. 2 week h/o low back pain and spasm. Denies headaches, seizures, numbness, or tingling. Denies depression, anxiety or insomnia.       Objective:   Physical Exam Pleasant well nourished female, alert and oriented x 3, in no cardio-pulmonary distress. Afebrile. HEENT No facial trauma or asymetry. Sinuses non tender.  EOMI, PERTL, .  External ears normal, tympanic membranes clear. Oropharynx moist, no exudate, poor  dentition. Neck: supple, no adenopathy,JVD or thyromegaly.No bruits.  Chest: Clear to ascultation bilaterally.No crackles or wheezes. Non tender to palpation  Breast: No asymetry,no masses. No nipple discharge or inversion. No axillary or supraclavicular adenopathy  Cardiovascular system; Heart sounds normal,  S1 and  S2 ,no S3.  No murmur, or thrill. Apical beat not displaced Peripheral pulses normal.  Abdomen: Soft, non tender, no organomegaly or masses. No bruits. Bowel sounds normal. No guarding, tenderness or rebound.  Rectal:  Piles. Guaiac negative stool.  GU: External genitalia  normal. No lesions.Genital and perineal vitiligo Vaginal canal normal.No discharge. Uterus absent , no adnexal masses, no  adnexal tenderness.  Musculoskeletal exam: Decreased  ROM of lumbar  Spine,adequate in  hips , shoulders and knees. No deformity ,swelling or crepitus noted. No muscle wasting or atrophy.   Neurologic: Cranial nerves 2 to 12 intact. Power, tone ,sensation and reflexes normal throughout. No disturbance in gait. No tremor.  Skin: Intact, no ulceration, maculopapular rash on both upper arms outer aspect. Genital vitiligo  Psych; Normal mood and affect. Judgement and concentration normal        Assessment & Plan:

## 2013-04-20 DIAGNOSIS — Z0000 Encounter for general adult medical examination without abnormal findings: Secondary | ICD-10-CM | POA: Insufficient documentation

## 2013-04-20 DIAGNOSIS — Z87898 Personal history of other specified conditions: Secondary | ICD-10-CM | POA: Insufficient documentation

## 2013-04-20 NOTE — Assessment & Plan Note (Signed)
Uncontrolled back spasm, add zanaflex

## 2013-04-20 NOTE — Assessment & Plan Note (Signed)
Symptomatic and inflamed, proctocort prescribed

## 2013-04-20 NOTE — Assessment & Plan Note (Signed)
Pelvic , breast and rectal exam as documented. Pt has remote h/o abnormal pap Counselled pt re importance of healthy diet, regular  exercise, seat belt use and condom use

## 2013-04-20 NOTE — Assessment & Plan Note (Signed)
Flare up on upper arms, topical steroid prescribed

## 2013-04-22 ENCOUNTER — Other Ambulatory Visit: Payer: Self-pay | Admitting: Family Medicine

## 2013-04-30 ENCOUNTER — Telehealth: Payer: Self-pay

## 2013-04-30 NOTE — Telephone Encounter (Signed)
Pt was mailed letter to be triaged. Please let me know once patient has been scheduled

## 2013-05-06 NOTE — Telephone Encounter (Signed)
Pt is scheduled for 05/13/2013 at 9:00 AM with AS for hemorrhoids, then TCS.

## 2013-05-13 ENCOUNTER — Ambulatory Visit (INDEPENDENT_AMBULATORY_CARE_PROVIDER_SITE_OTHER): Payer: PRIVATE HEALTH INSURANCE | Admitting: Gastroenterology

## 2013-05-13 ENCOUNTER — Encounter: Payer: Self-pay | Admitting: Gastroenterology

## 2013-05-13 VITALS — BP 119/80 | HR 71 | Temp 97.4°F | Ht 61.0 in | Wt 124.6 lb

## 2013-05-13 DIAGNOSIS — K5909 Other constipation: Secondary | ICD-10-CM

## 2013-05-13 DIAGNOSIS — Z1211 Encounter for screening for malignant neoplasm of colon: Secondary | ICD-10-CM

## 2013-05-13 DIAGNOSIS — K5900 Constipation, unspecified: Secondary | ICD-10-CM

## 2013-05-13 MED ORDER — LINACLOTIDE 145 MCG PO CAPS: 145.0000 ug | ORAL_CAPSULE | Freq: Every day | ORAL | Status: DC

## 2013-05-13 MED ORDER — PEG 3350-KCL-NA BICARB-NACL 420 G PO SOLR: 4000.0000 mL | ORAL | Status: DC

## 2013-05-13 NOTE — Patient Instructions (Addendum)
Start taking Linzess 1 capsule each morning, 30 minutes before breakfast. This is for constipation. Even if you start feeling better, keep taking this so you are being proactive with your bowel regimen.   We have set you up for a colonoscopy with Dr. Darrick Penna in the near future.

## 2013-05-13 NOTE — Assessment & Plan Note (Signed)
TCS as planned.  

## 2013-05-13 NOTE — Progress Notes (Signed)
Cc PCP 

## 2013-05-13 NOTE — Progress Notes (Signed)
Referring Provider: Kerri Perches, MD Primary Care Physician:  Syliva Overman, MD Primary Gastroenterologist:  Dr. Darrick Penna   Chief Complaint  Patient presents with  . Colonoscopy  . Hemorrhoids    HPI:   52 year old female presents today at the request of Dr. Lodema Hong for screening colonoscopy. Possible remote history of ulcers, age 18 or 45. No prior colonoscopy. Denies abdominal pain. Occasional low-volume hematochezia with hard bowel movements. Chronic constipation "since I was born". States bowel movements range from 1-3 times per day. Tiny balls, hard. Generic colace and Miralax. No significant weight changes. History of GERD, takes Nexium once daily with good control. No dysphagia. Occasional hemorrhoid issue. Started after second child.   Past Medical History  Diagnosis Date  . GERD (gastroesophageal reflux disease)   . Glaucoma   . Hyperlipidemia   . Hypertension   . Neck pain 7/09    WITH BULGING DISC-----RECIEVING EPIDURALS   . Sinusitis   . Cancer     abnormal pap [facility] at [facility]  . Constipation     Past Surgical History  Procedure Laterality Date  . Tubal ligation  1991  . Bilateral eye surgery      for glaucoma  . Total abdominal hysterectomy w/ bilateral salpingoophorectomy  2007    [facility], [facility]  . Cyst removed left breast /benign  2005    left, [facility]  . Abdominal exploration surgery  age 24    bowel obstruction, [facility]  . Breast surgery  2004    left partial mastectomy, [facility]  . Abdominal hysterectomy  2007    [facility]  . Cataract extraction w/phaco  05/16/2011    Procedure: CATARACT EXTRACTION PHACO AND INTRAOCULAR LENS PLACEMENT (IOC);  Surgeon: Gemma Payor;  Location: [facility];  Service: Ophthalmology;  Laterality: Right;  . Repair imperforate anus / anorectoplasty      per patient    Current Outpatient Prescriptions  Medication Sig Dispense Refill  . benazepril (LOTENSIN) 10 MG tablet TAKE ONE TABLET BY MOUTH ONCE DAILY.  90 tablet  1  . celecoxib  (CELEBREX) 200 MG capsule Take 200 mg by mouth daily.      . clobetasol cream (TEMOVATE) 0.05 % Apply twice daily to affected areas, sparingly for 10 days  30 g  0  . CRESTOR 10 MG tablet TAKE (1) TABLET BY MOUTH AT BEDTIME.  30 tablet  4  . cycloSPORINE (RESTASIS) 0.05 % ophthalmic emulsion 1 drop 2 (two) times daily.      Marland Kitchen docusate sodium (COLACE) 100 MG capsule Take 100 mg by mouth daily as needed.      . fluticasone (FLONASE) 50 MCG/ACT nasal spray USE 2 SPRAYS IN EACH NOSTRIL DAILY.  16 g  3  . hydrocortisone (PROCTOCORT) 1 % CREA Apply three times daily to affected area for 5 days, then as  needed  1 Tube  1  . MIRALAX powder MIX 1 CAPFUL (17G) IN 8 OUNCES OF JUICE/WATER AND DRINK ONCE DAILY.  255 g  6  . montelukast (SINGULAIR) 10 MG tablet TAKE 1 TABLET BY MOUTH ONCE DAILY.  90 tablet  1  . Multiple Vitamin (MULTIVITAMIN) capsule Take 1 capsule by mouth daily.      . naproxen (NAPROSYN) 500 MG tablet Take 1 tablet (500 mg total) by mouth 2 (two) times daily with a meal.  60 tablet  1  . NEXIUM 40 MG capsule TAKE ONE CAPSULE BY MOUTH DAILY BEFORE BREAKFAST.  30 capsule  4  . PROAIR HFA 108 (  90 BASE) MCG/ACT inhaler INHALE 2 PUFFS BY MOUTH EVERY 6 HOURS AS NEEDED FOR SHORTNESS OF BREATH.  8.5 g  3  . tizanidine (ZANAFLEX) 2 MG capsule One capsule at bedtime, as needed, for back spasm  30 capsule  0  . travoprost, benzalkonium, (TRAVATAN) 0.004 % ophthalmic solution Place 1 drop into both eyes at bedtime.        . triamterene-hydrochlorothiazide (MAXZIDE-25) 37.5-25 MG per tablet TAKE 1 TABLET BY MOUTH ONCE A DAY.  30 tablet  3  . venlafaxine (EFFEXOR) 37.5 MG tablet Take 1 tablet (37.5 mg total) by mouth daily.  90 tablet  1  . Linaclotide (LINZESS) 145 MCG CAPS Take 1 capsule (145 mcg total) by mouth daily. 30 minutes before breakfast  30 capsule  3  . polyethylene glycol-electrolytes (TRILYTE) 420 G solution Take 4,000 mLs by mouth as directed.  4000 mL  0   No current  facility-administered medications for this visit.    Allergies as of 05/13/2013  . (No Known Allergies)    Family History  Problem Relation Age of Onset  . Adopted: Yes  . Alcohol abuse Mother   . Cancer Mother   . Lung cancer Father   . Glaucoma Father   . Cancer Father   . Breast cancer Sister   . Cancer Sister   . Cancer Sister   . Heart disease Sister   . Colon cancer Neg Hx     History   Social History  . Marital Status: Legally Separated    Spouse Name: N/A    Number of Children: 3  . Years of Education: N/A   Occupational History  . UNEMPLOYED 2011     disability  .     Social History Main Topics  . Smoking status: Former Smoker    Quit date: 05/12/1991  . Smokeless tobacco: Never Used  . Alcohol Use: Yes     Comment: rare  . Drug Use: No  . Sexually Active: Not on file   Other Topics Concern  . Not on file   Social History Narrative  . No narrative on file    Review of Systems: Gen: +fatigue CV: Denies chest pain, heart palpitations, syncope, peripheral edema. Resp: Denies shortness of breath with rest, cough, wheezing GI: see HPI GU : Denies urinary burning, urinary frequency, urinary incontinence.  MS: back pain Derm: Denies rash, itching, dry skin Psych: depression, on medications Heme: Denies bruising, bleeding, and enlarged lymph nodes.  Physical Exam: BP 119/80  Pulse 71  Temp(Src) 97.4 F (36.3 C) (Oral)  Ht 5\' 1"  (1.549 m)  Wt 124 lb 9.6 oz (56.518 kg)  BMI 23.56 kg/m2 General:   Alert and oriented. Well-developed, well-nourished, pleasant and cooperative. Head:  Normocephalic and atraumatic. Eyes:  Conjunctiva pink, sclera clear, no icterus.   Conjunctiva pink. Ears:  Normal auditory acuity. Nose:  No deformity, discharge,  or lesions. Mouth:  No deformity or lesions, mucosa pink and moist.  Neck:  Supple, without mass or thyromegaly. Lungs:  Clear to auscultation bilaterally, without wheezing, rales, or rhonchi.  Heart:   S1, S2 present without murmurs noted.  Abdomen:  +BS, soft, non-tender and non-distended. Without mass or HSM. No rebound or guarding. No hernias noted. Rectal:  Deferred  Msk:  Symmetrical without gross deformities. Normal posture. Extremities:  Without clubbing or edema. Neurologic:  Alert and  oriented x4;  grossly normal neurologically. Skin:  Intact, warm and dry without significant lesions or rashes Cervical Nodes:  No significant cervical adenopathy. Psych:  Alert and cooperative. Normal mood and affect.

## 2013-05-13 NOTE — Assessment & Plan Note (Signed)
52 year old female with chronic constipation, no rectal bleeding or other concerning features. Less than ideal results with Miralax and OTC agents. Trial of Linzess 145 mcg daily, samples and prescription provided.   Also due for routine screening colonoscopy in near future. Proceed with colonoscopy with Dr. Darrick Penna: The risks, benefits, and alternatives have been discussed in detail with the patient. They state understanding and desire to proceed.

## 2013-05-14 ENCOUNTER — Encounter (HOSPITAL_COMMUNITY): Payer: Self-pay | Admitting: Pharmacy Technician

## 2013-05-17 ENCOUNTER — Encounter (HOSPITAL_COMMUNITY)
Admission: RE | Disposition: A | Discharge status: Home / Self Care | Payer: Self-pay | Source: Ambulatory Visit | Attending: Gastroenterology

## 2013-05-17 ENCOUNTER — Ambulatory Visit (HOSPITAL_COMMUNITY)
Admission: RE | Admit: 2013-05-17 | Discharge: 2013-05-17 | Disposition: A | Discharge status: Home / Self Care | Payer: PRIVATE HEALTH INSURANCE | Source: Ambulatory Visit | Attending: Gastroenterology | Admitting: Gastroenterology

## 2013-05-17 DIAGNOSIS — K5730 Diverticulosis of large intestine without perforation or abscess without bleeding: Secondary | ICD-10-CM | POA: Insufficient documentation

## 2013-05-17 DIAGNOSIS — I10 Essential (primary) hypertension: Secondary | ICD-10-CM | POA: Insufficient documentation

## 2013-05-17 DIAGNOSIS — Z1211 Encounter for screening for malignant neoplasm of colon: Secondary | ICD-10-CM | POA: Insufficient documentation

## 2013-05-17 HISTORY — PX: COLONOSCOPY: SHX5424

## 2013-05-17 SURGERY — COLONOSCOPY
Anesthesia: Moderate Sedation

## 2013-05-17 MED ORDER — MIDAZOLAM HCL 5 MG/5ML IJ SOLN
INTRAMUSCULAR | Status: AC
  Filled 2013-05-17: qty 10

## 2013-05-17 MED ORDER — SODIUM CHLORIDE 0.9 % IV SOLN
INTRAVENOUS | Status: DC
  Administered 2013-05-17: 13:00:00 via INTRAVENOUS

## 2013-05-17 MED ORDER — STERILE WATER FOR IRRIGATION IR SOLN
Status: DC | PRN
  Administered 2013-05-17: 13:00:00

## 2013-05-17 MED ORDER — MIDAZOLAM HCL 5 MG/5ML IJ SOLN
INTRAMUSCULAR | Status: DC | PRN
  Administered 2013-05-17 (×2): 2 mg via INTRAVENOUS

## 2013-05-17 MED ORDER — MEPERIDINE HCL 100 MG/ML IJ SOLN
INTRAMUSCULAR | Status: AC
  Filled 2013-05-17: qty 1

## 2013-05-17 MED ORDER — MEPERIDINE HCL 100 MG/ML IJ SOLN
INTRAMUSCULAR | Status: DC | PRN
  Administered 2013-05-17 (×2): 25 mg

## 2013-05-17 NOTE — Op Note (Signed)
[facility] [address] [place], [address]   COLONOSCOPY PROCEDURE REPORT  PATIENT: Toni Miller, Toni Miller  MR#: [id number] BIRTHDATE: 1960-11-19 , 52  yrs. old GENDER: Female ENDOSCOPIST: Jonette Eva, MD REFERRED [id number] Lodema Hong, M.D. PROCEDURE DATE:  05/17/2013 PROCEDURE:   Colonoscopy, screening INDICATIONS:Average risk patient for colon cancer.  PT USED A MINERAL OIL ENEMA. MEDICATIONS: Demerol 50 mg IV and Versed 4 mg IV  DESCRIPTION OF PROCEDURE:    Physical exam was performed.  Informed consent was obtained from the patient after explaining the benefits, risks, and alternatives to procedure.  The patient was connected to monitor and placed in left lateral position. Continuous oxygen was provided by nasal cannula and IV medicine administered through an indwelling cannula.  After administration of sedation and rectal exam, the patients rectum was intubated and the EC-3890Li ([id number]) and EC-3490TLi ([id number])  colonoscope was advanced under direct visualization to the cecum.  The scope was removed slowly by carefully examining the color, texture, anatomy, and integrity mucosa on the way out.  The patient was recovered in endoscopy and discharged home in satisfactory condition.       COLON FINDINGS: Mild diverticulosis was noted in the sigmoid colon. , The colon mucosa was otherwise normal. NO POLYPS, CANCERS, OR AVMs. and Small internal hemorrhoids were found.  PREP QUALITY: good.  CECAL W/D TIME: 13 minutes     COMPLICATIONS: None  ENDOSCOPIC IMPRESSION: 1.   Mild diverticulosis was noted in the sigmoid colon 2.   INTERMITTENT RECTAL BLEEDING DUE TO Small internal hemorrhoids-  RECOMMENDATIONS: HIGH FIBER DIET TCS IN 10 YEARS. AVOID MINERAL OIL ENEMA.       _______________________________ Rosalie DoctorJonette Eva, MD 05/17/2013 3:40 PM

## 2013-05-17 NOTE — H&P (Signed)
Primary Care Physician:  Syliva Overman, MD Primary Gastroenterologist:  Dr. Darrick Penna  Pre-Procedure History & Physical: HPI:  Toni Miller is a 52 y.o. female here for COLON CANCER SCREENING.  Past Medical History  Diagnosis Date  . GERD (gastroesophageal reflux disease)   . Glaucoma   . Hyperlipidemia   . Hypertension   . Neck pain 7/09    WITH BULGING DISC-----RECIEVING EPIDURALS   . Sinusitis   . Cancer     abnormal pap [facility] at [facility]  . Constipation     Past Surgical History  Procedure Laterality Date  . Tubal ligation  1991  . Bilateral eye surgery      for glaucoma  . Total abdominal hysterectomy w/ bilateral salpingoophorectomy  2007    [facility]  . Cyst removed left breast /benign  2005    left, [facility]  . Abdominal exploration surgery  age 25    bowel obstruction, [facility]  . Breast surgery  2004    left partial mastectomy, [facility]  . Abdominal hysterectomy  2007    [facility]  . Cataract extraction w/phaco  05/16/2011    Procedure: CATARACT EXTRACTION PHACO AND INTRAOCULAR LENS PLACEMENT (IOC);  Surgeon: Gemma Payor;  Location: [facility];  Service: Ophthalmology;  Laterality: Right;  . Repair imperforate anus / anorectoplasty      per patient    Prior to Admission medications   Medication Sig Start Date End Date Taking? Authorizing Provider  albuterol (PROAIR HFA) 108 (90 BASE) MCG/ACT inhaler Inhale 2 puffs into the lungs every 6 (six) hours as needed for wheezing or shortness of breath.   Yes Historical Provider, MD  benazepril (LOTENSIN) 10 MG tablet Take 10 mg by mouth daily.   Yes Historical Provider, MD  celecoxib (CELEBREX) 200 MG capsule Take 200 mg by mouth daily.   Yes Historical Provider, MD  clobetasol cream (TEMOVATE) 0.05 % Apply twice daily to affected areas, sparingly for 10 days 04/19/13  Yes Kerri Perches, MD  cycloSPORINE (RESTASIS) 0.05 % ophthalmic emulsion 1 drop 2 (two) times daily.   Yes Historical Provider, MD  docusate sodium (COLACE) 100 MG  capsule Take 100 mg by mouth daily.   Yes Historical Provider, MD  esomeprazole (NEXIUM) 40 MG capsule Take 40 mg by mouth daily before breakfast.   Yes Historical Provider, MD  fluticasone (FLONASE) 50 MCG/ACT nasal spray Place 2 sprays into the nose daily.   Yes Historical Provider, MD  hydrocortisone (PROCTOCORT) 1 % CREA Apply three times daily to affected area for 5 days, then as  needed 04/19/13  Yes Kerri Perches, MD  Linaclotide  [facility]) 145 MCG CAPS Take 1 capsule (145 mcg total) by mouth daily. 30 minutes before breakfast 05/13/13  Yes Nira Retort, NP  montelukast (SINGULAIR) 10 MG tablet Take 10 mg by mouth at bedtime.   Yes Historical Provider, MD  Multiple Vitamin (MULTIVITAMIN) capsule Take 1 capsule by mouth daily.   Yes Historical Provider, MD  naproxen (NAPROSYN) 500 MG tablet Take 1 tablet (500 mg total) by mouth 2 (two) times daily with a meal. 08/20/12 08/20/13 Yes Salley Scarlet, MD  polyethylene glycol powder (GLYCOLAX/MIRALAX) powder Take 17 g by mouth daily.   Yes Historical Provider, MD  polyethylene glycol-electrolytes (TRILYTE) 420 G solution Take 4,000 mLs by mouth as directed. 05/13/13  Yes West Bali, MD  rosuvastatin (CRESTOR) 10 MG tablet Take 10 mg by mouth daily.   Yes Historical Provider, MD  tizanidine (ZANAFLEX) 2  MG capsule Take 2 mg by mouth at bedtime as needed for muscle spasms. Muscle spasm   Yes Historical Provider, MD  triamterene-hydrochlorothiazide (MAXZIDE-25) 37.5-25 MG per tablet Take 1 tablet by mouth daily.   Yes Historical Provider, MD  venlafaxine (EFFEXOR) 37.5 MG tablet Take 1 tablet (37.5 mg total) by mouth daily. 12/19/12  Yes Kerri Perches, MD  travoprost, benzalkonium, (TRAVATAN) 0.004 % ophthalmic solution Place 1 drop into both eyes at bedtime.      Historical Provider, MD    Allergies as of 05/13/2013  . (No Known Allergies)    Family History  Problem Relation Age of Onset  . Adopted: Yes  . Alcohol abuse Mother   .  Cancer Mother   . Lung cancer Father   . Glaucoma Father   . Cancer Father   . Breast cancer Sister   . Cancer Sister   . Cancer Sister   . Heart disease Sister   . Colon cancer Neg Hx     History   Social History  . Marital Status: Legally Separated    Spouse Name: N/A    Number of Children: 3  . Years of Education: N/A   Occupational History  . UNEMPLOYED 2011     disability  .     Social History Main Topics  . Smoking status: Former Smoker    Quit date: 05/12/1991  . Smokeless tobacco: Never Used  . Alcohol Use: Yes     Comment: rare  . Drug Use: No  . Sexually Active: Not on file   Other Topics Concern  . Not on file   Social History Narrative  . No narrative on file    Review of Systems: See HPI, otherwise negative ROS   Physical Exam: BP 125/87  Pulse 118  Temp(Src) 98 F (36.7 C) (Oral)  Resp 18  SpO2 100% General:   Alert,  pleasant and cooperative in NAD Head:  Normocephalic and atraumatic. Neck:  Supple; Lungs:  Clear throughout to auscultation.    Heart:  Regular rate and rhythm. Abdomen:  Soft, nontender and nondistended. Normal bowel sounds, without guarding, and without rebound.   Neurologic:  Alert and  oriented x4;  grossly normal neurologically.  Impression/Plan:     SCREENING  Plan:  1. TCS TODAY

## 2013-05-20 ENCOUNTER — Encounter (HOSPITAL_COMMUNITY): Payer: Self-pay | Admitting: Gastroenterology

## 2013-06-07 ENCOUNTER — Other Ambulatory Visit: Payer: Self-pay | Admitting: Family Medicine

## 2013-07-15 ENCOUNTER — Other Ambulatory Visit: Payer: Self-pay | Admitting: Family Medicine

## 2013-07-15 DIAGNOSIS — Z09 Encounter for follow-up examination after completed treatment for conditions other than malignant neoplasm: Secondary | ICD-10-CM

## 2013-08-09 ENCOUNTER — Other Ambulatory Visit: Payer: Self-pay | Admitting: Family Medicine

## 2013-08-09 LAB — LIPID PANEL
HDL: 61 mg/dL (ref >39)
LDL Cholesterol: 63 mg/dL (ref 0–99)
Total CHOL/HDL Ratio: 2.3 Ratio
Triglycerides: 80 mg/dL (ref <150)
VLDL: 16 mg/dL (ref 0–40)

## 2013-08-09 LAB — COMPREHENSIVE METABOLIC PANEL
AST: 20 U/L (ref 0–37)
Alkaline Phosphatase: 56 U/L (ref 39–117)
BUN: 15 mg/dL (ref 6–23)
Glucose, Bld: 79 mg/dL (ref 70–99)
Potassium: 3.6 mEq/L (ref 3.5–5.3)
Total Bilirubin: 0.4 mg/dL (ref 0.3–1.2)

## 2013-08-09 LAB — CBC
HCT: 37.6 % (ref 36.0–46.0)
Hemoglobin: 13 g/dL (ref 12.0–15.0)
MCH: 29.6 pg (ref 26.0–34.0)
MCHC: 34.6 g/dL (ref 30.0–36.0)
RDW: 13.8 % (ref 11.5–15.5)

## 2013-08-09 LAB — TSH: TSH: 1.762 u[IU]/mL (ref 0.350–4.500)

## 2013-08-12 ENCOUNTER — Ambulatory Visit (HOSPITAL_COMMUNITY)
Admission: RE | Admit: 2013-08-12 | Discharge: 2013-08-12 | Disposition: A | Discharge status: Home / Self Care | Payer: PRIVATE HEALTH INSURANCE | Source: Ambulatory Visit | Attending: Family Medicine | Admitting: Family Medicine

## 2013-08-12 DIAGNOSIS — Z09 Encounter for follow-up examination after completed treatment for conditions other than malignant neoplasm: Secondary | ICD-10-CM

## 2013-08-12 DIAGNOSIS — Z1231 Encounter for screening mammogram for malignant neoplasm of breast: Secondary | ICD-10-CM | POA: Insufficient documentation

## 2013-08-21 ENCOUNTER — Encounter: Payer: Self-pay | Admitting: Family Medicine

## 2013-08-21 ENCOUNTER — Encounter (INDEPENDENT_AMBULATORY_CARE_PROVIDER_SITE_OTHER): Payer: Self-pay

## 2013-08-21 ENCOUNTER — Ambulatory Visit (INDEPENDENT_AMBULATORY_CARE_PROVIDER_SITE_OTHER): Payer: PRIVATE HEALTH INSURANCE | Admitting: Family Medicine

## 2013-08-21 VITALS — BP 120/80 | HR 60 | Resp 16 | Wt 119.0 lb

## 2013-08-21 DIAGNOSIS — I889 Nonspecific lymphadenitis, unspecified: Secondary | ICD-10-CM | POA: Insufficient documentation

## 2013-08-21 DIAGNOSIS — J301 Allergic rhinitis due to pollen: Secondary | ICD-10-CM

## 2013-08-21 DIAGNOSIS — I10 Essential (primary) hypertension: Secondary | ICD-10-CM

## 2013-08-21 DIAGNOSIS — Z23 Encounter for immunization: Secondary | ICD-10-CM

## 2013-08-21 DIAGNOSIS — E785 Hyperlipidemia, unspecified: Secondary | ICD-10-CM

## 2013-08-21 DIAGNOSIS — R51 Headache: Secondary | ICD-10-CM

## 2013-08-21 DIAGNOSIS — M542 Cervicalgia: Secondary | ICD-10-CM | POA: Insufficient documentation

## 2013-08-21 MED ORDER — KETOROLAC TROMETHAMINE 60 MG/2ML IJ SOLN
60.0000 mg | Freq: Once | INTRAMUSCULAR | Status: AC
  Administered 2013-08-21: 60 mg via INTRAMUSCULAR

## 2013-08-21 MED ORDER — PREDNISONE 5 MG PO TABS: 5.0000 mg | ORAL_TABLET | Freq: Two times a day (BID) | ORAL | Status: AC

## 2013-08-21 MED ORDER — MONTELUKAST SODIUM 10 MG PO TABS: 10.0000 mg | ORAL_TABLET | Freq: Every day | ORAL | Status: DC

## 2013-08-21 MED ORDER — BENAZEPRIL HCL 10 MG PO TABS: 10.0000 mg | ORAL_TABLET | Freq: Every day | ORAL | Status: DC

## 2013-08-21 MED ORDER — CYCLOBENZAPRINE HCL 10 MG PO TABS: ORAL_TABLET | ORAL | Status: DC

## 2013-08-21 MED ORDER — CEPHALEXIN 250 MG PO CAPS: 250.0000 mg | ORAL_CAPSULE | Freq: Three times a day (TID) | ORAL | Status: AC

## 2013-08-21 MED ORDER — ROSUVASTATIN CALCIUM 10 MG PO TABS: 10.0000 mg | ORAL_TABLET | Freq: Every day | ORAL | Status: DC

## 2013-08-21 MED ORDER — METHYLPREDNISOLONE ACETATE 80 MG/ML IJ SUSP
80.0000 mg | Freq: Once | INTRAMUSCULAR | Status: AC
  Administered 2013-08-21: 80 mg via INTRAMUSCULAR

## 2013-08-21 MED ORDER — TRIAMTERENE-HCTZ 37.5-25 MG PO TABS: 1.0000 | ORAL_TABLET | Freq: Every day | ORAL | Status: DC

## 2013-08-21 NOTE — Progress Notes (Signed)
  Subjective:    Patient ID: Toni Miller, female    DOB: 1961-03-23, 52 y.o.   MRN: [id number]  HPI The PT is here for follow up and re-evaluation of chronic medical conditions, medication management and review of any available recent lab and radiology data.  Preventive health is updated, specifically  Cancer screening and Immunization.   Questions or concerns regarding consultations or procedures which the PT has had in the interim are  addressed. The PT denies any adverse reactions to current medications since the last visit.  C/o intermittent left neck spasm associated with headaches for the past 3 month. No h/o fevrr, sinus drainage , sore throat or ear pain.  C/o tender gland in posterior neck x 1 week Up to date with eye exam      Review of Systems See HPI Denies recent fever or chills. Denies sinus pressure, nasal congestion, ear pain or sore throat. Denies chest congestion, productive cough or wheezing. Denies chest pains, palpitations and leg swelling Denies abdominal pain, nausea, vomiting,diarrhea or constipation.   Denies dysuria, frequency, hesitancy or incontinence. Chronic neck pain and spasm with intermittent flares Denies  seizures, numbness, or tingling. Denies depression, anxiety or insomnia. Denies skin break down or rash.        Objective:   Physical Exam  Patient alert and oriented and in no cardiopulmonary distress.  HEENT: No facial asymmetry, EOMI, no sinus tenderness,  oropharynx pink and moist.  Neck decreased ROM with left trapezius spasm,left posterior cervical adenopathy   Chest: Clear to auscultation bilaterally.  CVS: S1, S2 no murmurs, no S3.  ABD: Soft non tender. Bowel sounds normal.  Ext: No edema  MS: Adequate ROM spine, shoulders, hips and knees.  Skin: Intact, no ulcerations or rash noted.  Psych: Good eye contact, normal affect. Memory intact not anxious or depressed appearing.  CNS: CN 2-12 intact, power, tone and  sensation normal throughout.       Assessment & Plan:

## 2013-08-21 NOTE — Patient Instructions (Addendum)
F/u in 4.5 month, call if you need me before  Labs are excellent  Flu vaccine today  Toradol 60 mg Im and depo medrol 80 mg Im for right neck pain.  Muscle relaxant and 5 day course of prednisone prescribed.   5 day course of antibiotic prescribed for tender gland in neck

## 2013-08-25 NOTE — Assessment & Plan Note (Signed)
Controlled, no change in medication DASH diet and commitment to daily physical activity for a minimum of 30 minutes discussed and encouraged, as a part of hypertension management. The importance of attaining a healthy weight is also discussed.  

## 2013-08-25 NOTE — Assessment & Plan Note (Signed)
Hyperlipidemia:Low fat diet discussed and encouraged.  Controlled, no change in medication   

## 2013-08-25 NOTE — Assessment & Plan Note (Signed)
Increased symptoms as ex[pected in the Fall, but adequate control

## 2013-08-25 NOTE — Assessment & Plan Note (Signed)
Neck pain and spasm causing headaches. Anti inflammatories in office then muscle relaxant

## 2013-08-25 NOTE — Assessment & Plan Note (Signed)
Intermittent and ongoing, associated with and caused by neck spasm and arthritis

## 2013-08-25 NOTE — Assessment & Plan Note (Signed)
Acute posterior left cervical adenitis, 5 day antibiotic course prescribed

## 2013-08-25 NOTE — Assessment & Plan Note (Signed)
Acute pain and spasm causing headache, anti inflammatories in office then prednisone and muscle relaxant

## 2013-09-09 ENCOUNTER — Other Ambulatory Visit: Payer: Self-pay | Admitting: Family Medicine

## 2013-09-25 ENCOUNTER — Other Ambulatory Visit: Payer: Self-pay | Admitting: Family Medicine

## 2013-10-23 ENCOUNTER — Telehealth: Payer: Self-pay

## 2013-10-23 NOTE — Telephone Encounter (Signed)
pls send the keflex after you spk with her

## 2013-10-23 NOTE — Telephone Encounter (Signed)
pls document if drainage and redness, I can start antibiotic and refer her to derm /surgery let me [name] Done

## 2013-10-23 NOTE — Addendum Note (Signed)
Addended by: Syliva Overman E on: 10/23/2013 05:40 PM   Modules accepted: Orders

## 2013-10-23 NOTE — Telephone Encounter (Signed)
Pls re let her I will send in 5 day antibiotic course keflex, if pain and swelling persist will need to go to the Ed for furhter eval, as not available to see her before next year

## 2013-10-23 NOTE — Telephone Encounter (Signed)
She states its the big toe on her right foot. Thinks its an ingrown nail, very painful, no drainage or redness, just a little puffy on the side of toe. Please advise treatment

## 2013-10-24 ENCOUNTER — Other Ambulatory Visit: Payer: Self-pay

## 2013-10-24 MED ORDER — CEPHALEXIN 500 MG PO CAPS: 500.0000 mg | ORAL_CAPSULE | Freq: Three times a day (TID) | ORAL | Status: DC

## 2013-10-24 NOTE — Telephone Encounter (Signed)
Patient aware.

## 2013-11-04 ENCOUNTER — Telehealth: Payer: Self-pay

## 2013-11-04 ENCOUNTER — Emergency Department (HOSPITAL_COMMUNITY)
Admission: EM | Admit: 2013-11-04 | Discharge: 2013-11-04 | Disposition: A | Discharge status: Home / Self Care | Payer: PRIVATE HEALTH INSURANCE | Attending: Emergency Medicine | Admitting: Emergency Medicine

## 2013-11-04 ENCOUNTER — Encounter (HOSPITAL_COMMUNITY): Payer: Self-pay | Admitting: Emergency Medicine

## 2013-11-04 DIAGNOSIS — IMO0002 Reserved for concepts with insufficient information to code with codable children: Secondary | ICD-10-CM | POA: Insufficient documentation

## 2013-11-04 DIAGNOSIS — K5900 Constipation, unspecified: Secondary | ICD-10-CM | POA: Insufficient documentation

## 2013-11-04 DIAGNOSIS — I10 Essential (primary) hypertension: Secondary | ICD-10-CM | POA: Insufficient documentation

## 2013-11-04 DIAGNOSIS — Z859 Personal history of malignant neoplasm, unspecified: Secondary | ICD-10-CM | POA: Insufficient documentation

## 2013-11-04 DIAGNOSIS — E785 Hyperlipidemia, unspecified: Secondary | ICD-10-CM | POA: Insufficient documentation

## 2013-11-04 DIAGNOSIS — K219 Gastro-esophageal reflux disease without esophagitis: Secondary | ICD-10-CM | POA: Insufficient documentation

## 2013-11-04 DIAGNOSIS — R339 Retention of urine, unspecified: Secondary | ICD-10-CM | POA: Diagnosis not present

## 2013-11-04 DIAGNOSIS — Z8739 Personal history of other diseases of the musculoskeletal system and connective tissue: Secondary | ICD-10-CM | POA: Insufficient documentation

## 2013-11-04 DIAGNOSIS — Z87891 Personal history of nicotine dependence: Secondary | ICD-10-CM | POA: Insufficient documentation

## 2013-11-04 DIAGNOSIS — Z79899 Other long term (current) drug therapy: Secondary | ICD-10-CM | POA: Insufficient documentation

## 2013-11-04 DIAGNOSIS — Z792 Long term (current) use of antibiotics: Secondary | ICD-10-CM | POA: Insufficient documentation

## 2013-11-04 DIAGNOSIS — R300 Dysuria: Secondary | ICD-10-CM | POA: Diagnosis present

## 2013-11-04 DIAGNOSIS — Z8669 Personal history of other diseases of the nervous system and sense organs: Secondary | ICD-10-CM | POA: Insufficient documentation

## 2013-11-04 DIAGNOSIS — Z8709 Personal history of other diseases of the respiratory system: Secondary | ICD-10-CM | POA: Insufficient documentation

## 2013-11-04 DIAGNOSIS — Z90710 Acquired absence of both cervix and uterus: Secondary | ICD-10-CM | POA: Insufficient documentation

## 2013-11-04 LAB — URINALYSIS, ROUTINE W REFLEX MICROSCOPIC
Bilirubin Urine: NEGATIVE
Glucose, UA: NEGATIVE mg/dL
Leukocytes, UA: NEGATIVE
Nitrite: NEGATIVE
Specific Gravity, Urine: >1.03 — ABNORMAL HIGH (ref 1.005–1.030)
Urobilinogen, UA: 0.2 mg/dL (ref 0.0–1.0)
pH: 6 (ref 5.0–8.0)

## 2013-11-04 NOTE — ED Notes (Signed)
Pt sent from Urgent care, says feels she cannot empty her bladder.  No fever ,  No NVD

## 2013-11-04 NOTE — ED Notes (Signed)
Pt with "funny feeling" with urination, pt states that she was able to urinate at Urgent Care, pt states there is some discomfort with urination

## 2013-11-04 NOTE — ED Provider Notes (Signed)
CSN: [id number]     Arrival date & time 11/04/13  1705 History   First MD Initiated Contact with Patient 11/04/13 1849     Chief Complaint  Patient presents with  . Dysuria   (Consider location/radiation/quality/duration/timing/severity/associated sxs/prior Treatment) HPI Comments: Toni Miller is a 52 y.o. female who presents to the Emergency Department complaining of recurrent episodes of urinary retention.  States that she was seen at the local urgent care for same and advised to come to the ED for further evaluation.  She states that she had similar symptoms "long time ago" and was told that she has "a weak bladder".  She states the symptoms are intermittent.  She denies burning or frequency of urination, fever, hematuria, flank pain or pelvic pain.  Reports hx of complete hysterectomy.     The history is provided by the patient.    Past Medical History  Diagnosis Date  . GERD (gastroesophageal reflux disease)   . Glaucoma   . Hyperlipidemia   . Hypertension   . Neck pain 7/09    WITH BULGING DISC-----RECIEVING EPIDURALS   . Sinusitis   . Constipation   . Cancer     abnormal pap treated at [facility]   Past Surgical History  Procedure Laterality Date  . Tubal ligation  1991  . Bilateral eye surgery      for glaucoma  . Total abdominal hysterectomy w/ bilateral salpingoophorectomy  2007    [facility], [facility]  . Cyst removed left breast /benign  2005    left, [facility]  . Abdominal exploration surgery  age 56    bowel obstruction, [facility]  . Abdominal hysterectomy  2007    [facility], [facility]  . Cataract extraction w/phaco  05/16/2011    Procedure: CATARACT EXTRACTION PHACO AND INTRAOCULAR LENS PLACEMENT ([facility]);  Surgeon: Gemma Payor;  Location: [facility];  Service: Ophthalmology;  Laterality: Right;  . Repair imperforate anus / anorectoplasty      per patient  . Colonoscopy N/A 05/17/2013    Procedure: COLONOSCOPY;  Surgeon: West Bali, MD;  Location: [facility] SUITE;  Service: Endoscopy;  Laterality: N/A;   1:15  . Breast surgery  2004    left partial mastectomy, [facility]  . Eye surgery     Family History  Problem Relation Age of Onset  . Adopted: Yes  . Alcohol abuse Mother   . Cancer Mother   . Lung cancer Father   . Glaucoma Father   . Cancer Father   . Breast cancer Sister   . Cancer Sister   . Cancer Sister   . Heart disease Sister   . Colon cancer Neg Hx    History  Substance Use Topics  . Smoking status: Former Smoker    Quit date: 05/12/1991  . Smokeless tobacco: Never Used  . Alcohol Use: Yes     Comment: rare   OB History   Grav Para Term Preterm Abortions TAB SAB Ect Mult Living                 Review of Systems  Gastrointestinal: Negative for nausea, vomiting, abdominal pain, diarrhea and abdominal distention.  Endocrine: Negative for polydipsia and polyuria.  Genitourinary: Positive for difficulty urinating. Negative for dysuria, urgency, frequency, hematuria, flank pain, decreased urine volume, vaginal bleeding, vaginal discharge, genital sores and pelvic pain.  Musculoskeletal: Negative for back pain.  Skin: Negative for rash.  Neurological: Negative for dizziness and facial asymmetry.  All other systems reviewed and are negative.  Allergies  Review of patient's allergies indicates no known allergies.  Home Medications   Current Outpatient Rx  Name  Route  Sig  Dispense  Refill  . benazepril (LOTENSIN) 10 MG tablet   Oral   Take 10 mg by mouth every morning.         . cyclobenzaprine (FLEXERIL) 10 MG tablet   Oral   Take 10 mg by mouth at bedtime as needed and may repeat dose one time if needed for muscle spasms.         . cycloSPORINE (RESTASIS) 0.05 % ophthalmic emulsion   Both Eyes   Place 1 drop into both eyes 2 (two) times daily.          Marland Kitchen docusate sodium (COLACE) 100 MG capsule   Oral   Take 100 mg by mouth daily.         Marland Kitchen esomeprazole (NEXIUM) 40 MG capsule   Oral   Take 40 mg by mouth daily before breakfast.         .  fluticasone (FLONASE) 50 MCG/ACT nasal spray   Nasal   Place 2 sprays into the nose daily.         . hydrocortisone (PROCTOCORT) 1 % CREA      Apply three times daily to affected area for 5 days, then as  needed   1 Tube   1   . Linaclotide (LINZESS) 145 MCG CAPS   Oral   Take 1 capsule (145 mcg total) by mouth daily. 30 minutes before breakfast   30 capsule   3   . montelukast (SINGULAIR) 10 MG tablet   Oral   Take 1 tablet (10 mg total) by mouth at bedtime.   30 tablet   4   . Multiple Vitamin (MULTIVITAMIN) capsule   Oral   Take 1 capsule by mouth daily.         . polyethylene glycol powder (GLYCOLAX/MIRALAX) powder      MIX 1 CAPFUL (17G) IN 8 OUNCES OF JUICE/WATER AND DRINK ONCE DAILY.   255 g   3   . rosuvastatin (CRESTOR) 10 MG tablet   Oral   Take 1 tablet (10 mg total) by mouth daily.   30 tablet   4   . tizanidine (ZANAFLEX) 2 MG capsule   Oral   Take 2 mg by mouth at bedtime as needed for muscle spasms. Muscle spasm         . travoprost, benzalkonium, (TRAVATAN) 0.004 % ophthalmic solution   Both Eyes   Place 1 drop into both eyes at bedtime.           . triamterene-hydrochlorothiazide (MAXZIDE-25) 37.5-25 MG per tablet   Oral   Take 1 tablet by mouth daily.   30 tablet   4   . venlafaxine (EFFEXOR) 37.5 MG tablet   Oral   Take 37.5 mg by mouth daily.         Marland Kitchen albuterol (PROAIR HFA) 108 (90 BASE) MCG/ACT inhaler   Inhalation   Inhale 2 puffs into the lungs every 6 (six) hours as needed for wheezing or shortness of breath.         . cephALEXin (KEFLEX) 500 MG capsule   Oral   Take 1 capsule (500 mg total) by mouth 3 (three) times daily.   15 capsule   0    BP 128/85  Pulse 67  Temp(Src) 98.2 F (36.8 C) (Oral)  Resp 18  Ht 5\' 1"  (1.549  m)  Wt 120 lb (54.432 kg)  BMI 22.69 kg/m2  SpO2 100% Physical Exam  Nursing note and vitals reviewed. Constitutional: She appears well-developed and well-nourished. No distress.   HENT:  Head: Normocephalic and atraumatic.  Cardiovascular: Normal rate, regular rhythm, normal heart sounds and intact distal pulses.   No murmur heard. Pulmonary/Chest: Effort normal and breath sounds normal. No respiratory distress. She exhibits no tenderness.  Abdominal: Soft. Normal appearance. She exhibits no distension and no mass. There is no tenderness. There is no rigidity, no rebound, no guarding, no CVA tenderness and no tenderness at McBurney's point.  Skin: She is not diaphoretic.    ED Course  Procedures (including critical care time) Labs Review Labs Reviewed  URINALYSIS, ROUTINE W REFLEX MICROSCOPIC - Abnormal; Notable for the following:    Specific Gravity, Urine >1.030 (*)    All other components within normal limits   Imaging Review No results found.  EKG Interpretation   None       MDM    Patient complains of intermittent urinary retention but urinated here w/o difficulty.  U/A is negative.  No flank pain or hematuria to suggest kidney stone.  Sx's are chronic and intermittent.  She is well appearing and stable for discharge.She agrees to urology referral.    Shiree Altemus L. Kason Benak, PA-C 11/05/13 0105

## 2013-11-05 NOTE — Telephone Encounter (Signed)
Patient states that she went to the urgent care for problems with bladder.  She was then referred to go to the ED which she did.  No acute findings in the ED.  She was discharged and told to follow up with urology.    She also states that she is seeing the podiatrist for an ingrown toenail today.  ( She states due to the fact that Dr. Anthony Sar schedule if full.)

## 2013-11-05 NOTE — ED Provider Notes (Signed)
  Medical screening examination/treatment/procedure(s) were performed by non-physician practitioner and as supervising physician I was immediately available for consultation/collaboration.  EKG Interpretation   None          Ishaan Villamar, MD 11/05/13 1600 

## 2014-01-03 ENCOUNTER — Other Ambulatory Visit: Payer: Self-pay | Admitting: Family Medicine

## 2014-01-06 ENCOUNTER — Ambulatory Visit (INDEPENDENT_AMBULATORY_CARE_PROVIDER_SITE_OTHER): Payer: PRIVATE HEALTH INSURANCE | Admitting: Family Medicine

## 2014-01-06 ENCOUNTER — Encounter (INDEPENDENT_AMBULATORY_CARE_PROVIDER_SITE_OTHER): Payer: Self-pay

## 2014-01-06 ENCOUNTER — Encounter: Payer: Self-pay | Admitting: Family Medicine

## 2014-01-06 VITALS — BP 120/84 | HR 84 | Resp 16 | Ht 61.0 in | Wt 118.0 lb

## 2014-01-06 DIAGNOSIS — F32A Depression, unspecified: Secondary | ICD-10-CM

## 2014-01-06 DIAGNOSIS — K5909 Other constipation: Secondary | ICD-10-CM

## 2014-01-06 DIAGNOSIS — E785 Hyperlipidemia, unspecified: Secondary | ICD-10-CM

## 2014-01-06 DIAGNOSIS — IMO0002 Reserved for concepts with insufficient information to code with codable children: Secondary | ICD-10-CM

## 2014-01-06 DIAGNOSIS — K1370 Unspecified lesions of oral mucosa: Secondary | ICD-10-CM | POA: Insufficient documentation

## 2014-01-06 DIAGNOSIS — F329 Major depressive disorder, single episode, unspecified: Secondary | ICD-10-CM

## 2014-01-06 DIAGNOSIS — F3289 Other specified depressive episodes: Secondary | ICD-10-CM

## 2014-01-06 DIAGNOSIS — J301 Allergic rhinitis due to pollen: Secondary | ICD-10-CM

## 2014-01-06 DIAGNOSIS — K219 Gastro-esophageal reflux disease without esophagitis: Secondary | ICD-10-CM

## 2014-01-06 DIAGNOSIS — K5900 Constipation, unspecified: Secondary | ICD-10-CM

## 2014-01-06 DIAGNOSIS — K1379 Other lesions of oral mucosa: Secondary | ICD-10-CM

## 2014-01-06 DIAGNOSIS — I10 Essential (primary) hypertension: Secondary | ICD-10-CM

## 2014-01-06 DIAGNOSIS — F3341 Major depressive disorder, recurrent, in partial remission: Secondary | ICD-10-CM | POA: Insufficient documentation

## 2014-01-06 LAB — COMPREHENSIVE METABOLIC PANEL
ALK PHOS: 59 U/L (ref 39–117)
ALT: 14 U/L (ref 0–35)
AST: 17 U/L (ref 0–37)
Albumin: 4.5 g/dL (ref 3.5–5.2)
BILIRUBIN TOTAL: 0.4 mg/dL (ref 0.2–1.2)
BUN: 18 mg/dL (ref 6–23)
CHLORIDE: 97 meq/L (ref 96–112)
CO2: 33 mEq/L — ABNORMAL HIGH (ref 19–32)
CREATININE: 0.79 mg/dL (ref 0.50–1.10)
Calcium: 9.4 mg/dL (ref 8.4–10.5)
Glucose, Bld: 81 mg/dL (ref 70–99)
Potassium: 3.6 mEq/L (ref 3.5–5.3)
Sodium: 139 mEq/L (ref 135–145)
Total Protein: 7.5 g/dL (ref 6.0–8.3)

## 2014-01-06 LAB — LIPID PANEL
CHOL/HDL RATIO: 2.1 ratio
Cholesterol: 164 mg/dL (ref 0–200)
HDL: 79 mg/dL (ref >39)
LDL CALC: 74 mg/dL (ref 0–99)
Triglycerides: 54 mg/dL (ref <150)
VLDL: 11 mg/dL (ref 0–40)

## 2014-01-06 MED ORDER — ESOMEPRAZOLE MAGNESIUM 40 MG PO CPDR: DELAYED_RELEASE_CAPSULE | ORAL | Status: DC

## 2014-01-06 MED ORDER — VENLAFAXINE HCL 37.5 MG PO TABS: 37.5000 mg | ORAL_TABLET | Freq: Every day | ORAL | Status: DC

## 2014-01-06 MED ORDER — BENAZEPRIL HCL 10 MG PO TABS: 10.0000 mg | ORAL_TABLET | Freq: Every morning | ORAL | Status: DC

## 2014-01-06 MED ORDER — TRIAMTERENE-HCTZ 37.5-25 MG PO TABS: 1.0000 | ORAL_TABLET | Freq: Every day | ORAL | Status: DC

## 2014-01-06 MED ORDER — ROSUVASTATIN CALCIUM 10 MG PO TABS: 10.0000 mg | ORAL_TABLET | Freq: Every day | ORAL | Status: DC

## 2014-01-06 NOTE — Patient Instructions (Addendum)
F/U in 2.5   months, call if you need me before  You are referred to therapist for counseling, and PLEASE commit to taking the venlafaxine every day  It is important that you exercise regularly at least 30 minutes 5 times a week. If you develop chest pain, have severe difficulty breathing, or feel very tired, stop exercising immediately and seek medical attention    A healthy diet is rich in fruit, vegetables and whole grains. Poultry fish, nuts and beans are a healthy choice for protein rather then red meat. A low sodium diet and drinking 64 ounces of water daily is generally recommended. Oils and sweet should be limited. Carbohydrates especially for those who are diabetic or overweight, should be limited to 45 to 60 gram per meal. It is important to eat on a regular schedule, at least 3 times daily. Snacks should be primarily fruits, vegetables or nuts.  Adequate rest, generally 6 to 8 hours per night is important for good health.Good sleep hygiene involves setting a regular bedtime, and turning off all sound and light in your sleep environment.Limiting caffeine intake will also help with the ability to rest well  Lab work needs to be done today, lipid, and CMP Use oragel on the sore under your tongue and also some medication will be sent to help with healing  You are referred to therapist for help with depression and please commit to taking the effexor EVERY day All medications need to be brought to every visit

## 2014-01-07 ENCOUNTER — Telehealth: Payer: Self-pay | Admitting: Family Medicine

## 2014-01-08 NOTE — Telephone Encounter (Signed)
Patient aware to use the orajel

## 2014-01-08 NOTE — Telephone Encounter (Signed)
pls advise her to use oTC oragel

## 2014-01-08 NOTE — Telephone Encounter (Signed)
On her AVS it said to use orajel and something would be sent in to help with healing. Please advse

## 2014-01-21 NOTE — Progress Notes (Signed)
TCS JUL 2014 [facility]  REVIEWED.

## 2014-01-23 ENCOUNTER — Encounter (HOSPITAL_COMMUNITY): Payer: Self-pay | Admitting: Psychiatry

## 2014-01-23 ENCOUNTER — Ambulatory Visit (INDEPENDENT_AMBULATORY_CARE_PROVIDER_SITE_OTHER): Payer: PRIVATE HEALTH INSURANCE | Admitting: Psychiatry

## 2014-01-23 DIAGNOSIS — F3289 Other specified depressive episodes: Secondary | ICD-10-CM

## 2014-01-23 DIAGNOSIS — F329 Major depressive disorder, single episode, unspecified: Secondary | ICD-10-CM

## 2014-01-23 DIAGNOSIS — F32A Depression, unspecified: Secondary | ICD-10-CM

## 2014-01-24 NOTE — Patient Instructions (Signed)
Discussed orally 
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
## 2014-02-13 ENCOUNTER — Ambulatory Visit (INDEPENDENT_AMBULATORY_CARE_PROVIDER_SITE_OTHER): Payer: PRIVATE HEALTH INSURANCE | Admitting: Psychiatry

## 2014-02-13 DIAGNOSIS — F32A Depression, unspecified: Secondary | ICD-10-CM

## 2014-02-13 DIAGNOSIS — F3289 Other specified depressive episodes: Secondary | ICD-10-CM

## 2014-02-13 DIAGNOSIS — F329 Major depressive disorder, single episode, unspecified: Secondary | ICD-10-CM
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
## 2014-02-13 NOTE — Patient Instructions (Signed)
Discussed orally 

## 2014-03-09 NOTE — Assessment & Plan Note (Signed)
appesars to be apthjous ulcer , pt to use topical oTC analgesic and salt water gargles

## 2014-03-09 NOTE — Assessment & Plan Note (Signed)
No current flare, muscle relaxant as needed

## 2014-03-09 NOTE — Progress Notes (Signed)
   Subjective:    Patient ID: Toni Miller, female    DOB: [birth year], 53 y.o.   MRN: [id number]  HPI The PT is here for follow up and re-evaluation of chronic medical conditions, medication management and review of any available recent lab and radiology data.  Preventive health is updated, specifically  Cancer screening and Immunization.   C/o painful sore in mouth, no h/o trauma inciting the problem, feels as though from medication , has had in the past, no fever or chills. C/o depression , not suicdial or homicidal but tearful and wants help, interested in therapy which she has never received in the past    Review of Systems See HPI Denies recent fever or chills. Denies sinus pressure, nasal congestion, ear pain or sore throat. Denies chest congestion, productive cough or wheezing. Denies chest pains, palpitations and leg swelling Denies abdominal pain, nausea, vomiting,diarrhea or constipation.   Denies dysuria, frequency, hesitancy or incontinence. Denies joint pain, swelling and limitation in mobility. Denies headaches, seizures, numbness, or tingling.       Objective:   Physical Exam BP 120/84  Pulse 84  Resp 16  Ht 5\' 1"  (1.549 m)  Wt 118 lb (53.524 kg)  BMI 22.31 kg/m2  SpO2 99% Patient alert and oriented and in no cardiopulmonary distress.  HEENT: No facial asymmetry, EOMI, no sinus tenderness,  oropharynx pink and moist.  Neck supple no adenopathy.apthous ulcer in mouth  Chest: Clear to auscultation bilaterally.  CVS: S1, S2 no murmurs, no S3.  ABD: Soft non tender. Bowel sounds normal.  Ext: No edema  MS: Adequate ROM spine, shoulders, hips and knees.  Skin: Intact, no ulcerations or rash noted.  Psych: Good eye contact, tearful and depressed affect. Memory intact  appearing.  CNS: CN 2-12 intact, power, tone and sensation normal throughout.        Assessment & Plan:  Depression Severe depression, npot suicidal or homicdal , no hallucinations.  A lot of unnresolved childhood and [ past issues, start effexpor and referred t to therapist also  Sore in mouth appesars to be apthjous ulcer , pt to use topical oTC analgesic and salt water gargles  HYPERLIPIDEMIA Controlled, no change in medication Hyperlipidemia:Low fat diet discussed and encouraged.    GERD Controlled, no change in medication   HYPERTENSION Controlled, no change in medication DASH diet and commitment to daily physical activity for a minimum of 30 minutes discussed and encouraged, as a part of hypertension management. The importance of attaining a healthy weight is also discussed.   ALLERGIC RHINITIS, SEASONAL Controlled on flonase , pt to continue same  Chronic constipation managewd with daily fiber and stool softener   BACK PAIN WITH RADICULOPATHY No current flare, muscle relaxant as needed

## 2014-03-09 NOTE — Assessment & Plan Note (Signed)
Controlled, no change in medication Hyperlipidemia:Low fat diet discussed and encouraged.  \ 

## 2014-03-09 NOTE — Assessment & Plan Note (Signed)
managewd with daily fiber and stool softener

## 2014-03-09 NOTE — Assessment & Plan Note (Signed)
Controlled, no change in medication  

## 2014-03-09 NOTE — Assessment & Plan Note (Signed)
Controlled on flonase , pt to continue same

## 2014-03-09 NOTE — Assessment & Plan Note (Signed)
Controlled, no change in medication DASH diet and commitment to daily physical activity for a minimum of 30 minutes discussed and encouraged, as a part of hypertension management. The importance of attaining a healthy weight is also discussed.  

## 2014-03-09 NOTE — Assessment & Plan Note (Signed)
Severe depression, npot suicidal or homicdal , no hallucinations. A lot of unnresolved childhood and [ past issues, start effexpor and referred t to therapist also

## 2014-03-13 ENCOUNTER — Ambulatory Visit (INDEPENDENT_AMBULATORY_CARE_PROVIDER_SITE_OTHER): Payer: PRIVATE HEALTH INSURANCE | Admitting: Psychiatry

## 2014-03-13 DIAGNOSIS — F329 Major depressive disorder, single episode, unspecified: Secondary | ICD-10-CM

## 2014-03-13 DIAGNOSIS — F3289 Other specified depressive episodes: Secondary | ICD-10-CM

## 2014-03-13 DIAGNOSIS — F32A Depression, unspecified: Secondary | ICD-10-CM
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
## 2014-03-13 NOTE — Patient Instructions (Signed)
Discussed orally 

## 2014-03-20 ENCOUNTER — Encounter: Payer: Self-pay | Admitting: Family Medicine

## 2014-03-20 ENCOUNTER — Ambulatory Visit (INDEPENDENT_AMBULATORY_CARE_PROVIDER_SITE_OTHER): Payer: PRIVATE HEALTH INSURANCE | Admitting: Family Medicine

## 2014-03-20 ENCOUNTER — Encounter (INDEPENDENT_AMBULATORY_CARE_PROVIDER_SITE_OTHER): Payer: Self-pay

## 2014-03-20 VITALS — BP 110/80 | HR 87 | Resp 16 | Wt 116.4 lb

## 2014-03-20 DIAGNOSIS — F329 Major depressive disorder, single episode, unspecified: Secondary | ICD-10-CM

## 2014-03-20 DIAGNOSIS — F3289 Other specified depressive episodes: Secondary | ICD-10-CM

## 2014-03-20 DIAGNOSIS — F32A Depression, unspecified: Secondary | ICD-10-CM

## 2014-03-20 DIAGNOSIS — J301 Allergic rhinitis due to pollen: Secondary | ICD-10-CM

## 2014-03-20 DIAGNOSIS — N951 Menopausal and female climacteric states: Secondary | ICD-10-CM

## 2014-03-20 DIAGNOSIS — J209 Acute bronchitis, unspecified: Secondary | ICD-10-CM

## 2014-03-20 DIAGNOSIS — Z13820 Encounter for screening for osteoporosis: Secondary | ICD-10-CM

## 2014-03-20 DIAGNOSIS — E785 Hyperlipidemia, unspecified: Secondary | ICD-10-CM

## 2014-03-20 DIAGNOSIS — I10 Essential (primary) hypertension: Secondary | ICD-10-CM

## 2014-03-20 MED ORDER — BENZONATATE 100 MG PO CAPS: 100.0000 mg | ORAL_CAPSULE | Freq: Two times a day (BID) | ORAL | Status: DC | PRN

## 2014-03-20 MED ORDER — CELECOXIB 200 MG PO CAPS: 200.0000 mg | ORAL_CAPSULE | Freq: Every day | ORAL | Status: DC

## 2014-03-20 MED ORDER — METHYLPREDNISOLONE ACETATE 80 MG/ML IJ SUSP
80.0000 mg | Freq: Once | INTRAMUSCULAR | Status: AC
  Administered 2014-03-20: 80 mg via INTRAMUSCULAR

## 2014-03-20 MED ORDER — AZITHROMYCIN 250 MG PO TABS: ORAL_TABLET | ORAL | Status: DC

## 2014-03-20 MED ORDER — FLUCONAZOLE 150 MG PO TABS: ORAL_TABLET | ORAL | Status: DC

## 2014-03-20 NOTE — Progress Notes (Signed)
   Subjective:    Patient ID: Toni Miller, female    DOB: 1961-03-24, 53 y.o.   MRN: [id number]  HPI The PT is here for follow up and re-evaluation of chronic medical conditions, medication management and review of any available recent lab and radiology data.  Preventive health is updated, specifically  Cancer screening and Immunization.   Questions or concerns regarding consultations or procedures which the PT has had in the interim are  Addressed.Is finding therapy useful for treating her depression also medication has helped The PT denies any adverse reactions to current medications since the last visit.  1 week h/o worsening  chest congestion, associated with fever and chills intermittently. Nasal drainage has thickened , and is yellowish  at times but mainly copious ansd clear, also increased watering of eyes and sneezing Sputum is thick and yellow. C/o bilateral ear pressure, denies hearing loss and sore throat. Increasing fatigue , poor appetitie and sleep disturbed by cough. No improvement with OTC medication.        Review of Systems See HPI  Denies sinus pressure, , ear pain or sore throat.  Denies  palpitations and leg swelling, has chest pain due to excessive  cough Denies abdominal pain, nausea, vomiting,diarrhea or constipation.   Denies dysuria, frequency, hesitancy or incontinence. Denies joint pain, swelling and limitation in mobility. Denies headaches, seizures, numbness, or tingling. Denies uncontrolled  depression, anxiety or insomnia. Denies skin break down or rash.        Objective:   Physical Exam  BP 110/80  Pulse 87  Resp 16  Wt 116 lb 6.4 oz (52.799 kg)  SpO2 100% Patient alert and oriented and in no cardiopulmonary distress.  HEENT: No facial asymmetry, EOMI, no sinus tenderness,  oropharynx pink and moist.  Neck supple no adenopathy.Increased clear nasal drainage and bilateral conjunctival injection, TM clear  Chest: bilateral crackles  no wheezes, adequate air entry  CVS: S1, S2 no murmurs, no S3.  ABD: Soft non tender. Bowel sounds normal.  Ext: No edema  MS: Adequate ROM spine, shoulders, hips and knees.  Skin: Intact, no ulcerations or rash noted.  Psych: Good eye contact, normal affect. Memory intact not anxious or depressed appearing.  CNS: CN 2-12 intact, power, tone and sensation normal throughout.       Assessment & Plan:  ALLERGIC RHINITIS, SEASONAL Uncontrolled, depo medrol followed by prednisone dose pak Ciontinue chronic meds  Acute bronchitis Tessalon perle and z pack  Depression Marked improvement and pt in therapy, continue current med, not suicidal or homicidal  HYPERLIPIDEMIA Controlled, no change in medication Hyperlipidemia:Low fat diet discussed and encouraged.    Hot flashes, menopausal Slight improvement noted with hot flashes on effexor , continue same  HYPERTENSION Controlled, no change in medication DASH diet and commitment to daily physical activity for a minimum of 30 minutes discussed and encouraged, as a part of hypertension management. The importance of attaining a healthy weight is also discussed.

## 2014-03-20 NOTE — Patient Instructions (Signed)
Annual wellness visit in 3 month, call if you need me before  Depression much improved, continue current med.   You are treated for uncontrolled allergies and acute bronchitis,  Medication is prescribed and Depo medrol 80mg  im in office  You will be  referred for dexa

## 2014-03-20 NOTE — Assessment & Plan Note (Signed)
Uncontrolled, depo medrol followed by prednisone dose pak Ciontinue chronic meds

## 2014-03-20 NOTE — Assessment & Plan Note (Signed)
Controlled, no change in medication Hyperlipidemia:Low fat diet discussed and encouraged.  \ 

## 2014-03-20 NOTE — Assessment & Plan Note (Signed)
Slight improvement noted with hot flashes on effexor , continue same

## 2014-03-20 NOTE — Assessment & Plan Note (Signed)
Controlled, no change in medication DASH diet and commitment to daily physical activity for a minimum of 30 minutes discussed and encouraged, as a part of hypertension management. The importance of attaining a healthy weight is also discussed.  

## 2014-03-20 NOTE — Assessment & Plan Note (Signed)
Marked improvement and pt in therapy, continue current med, not suicidal or homicidal

## 2014-03-20 NOTE — Assessment & Plan Note (Signed)
Tessalon perle and z pack

## 2014-04-02 ENCOUNTER — Other Ambulatory Visit: Payer: Self-pay | Admitting: Family Medicine

## 2014-04-02 ENCOUNTER — Encounter: Payer: Self-pay | Admitting: Family Medicine

## 2014-04-02 ENCOUNTER — Ambulatory Visit (HOSPITAL_COMMUNITY)
Admission: RE | Admit: 2014-04-02 | Discharge: 2014-04-02 | Disposition: A | Discharge status: Home / Self Care | Payer: PRIVATE HEALTH INSURANCE | Source: Ambulatory Visit | Attending: Family Medicine | Admitting: Family Medicine

## 2014-04-02 DIAGNOSIS — M8580 Other specified disorders of bone density and structure, unspecified site: Secondary | ICD-10-CM | POA: Insufficient documentation

## 2014-04-02 DIAGNOSIS — Z13820 Encounter for screening for osteoporosis: Secondary | ICD-10-CM

## 2014-04-11 ENCOUNTER — Ambulatory Visit (INDEPENDENT_AMBULATORY_CARE_PROVIDER_SITE_OTHER): Payer: 59 | Admitting: Psychiatry

## 2014-04-11 DIAGNOSIS — F32A Depression, unspecified: Secondary | ICD-10-CM

## 2014-04-11 DIAGNOSIS — F329 Major depressive disorder, single episode, unspecified: Secondary | ICD-10-CM

## 2014-04-11 DIAGNOSIS — F3289 Other specified depressive episodes: Secondary | ICD-10-CM
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
## 2014-04-11 NOTE — Patient Instructions (Signed)
Discussed orally 

## 2014-05-16 ENCOUNTER — Ambulatory Visit (INDEPENDENT_AMBULATORY_CARE_PROVIDER_SITE_OTHER): Payer: PRIVATE HEALTH INSURANCE | Admitting: Psychiatry

## 2014-05-16 DIAGNOSIS — F32A Depression, unspecified: Secondary | ICD-10-CM

## 2014-05-16 DIAGNOSIS — F3289 Other specified depressive episodes: Secondary | ICD-10-CM

## 2014-05-16 DIAGNOSIS — F329 Major depressive disorder, single episode, unspecified: Secondary | ICD-10-CM
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
## 2014-05-16 NOTE — Patient Instructions (Signed)
Discussed orally 

## 2014-06-20 ENCOUNTER — Ambulatory Visit (INDEPENDENT_AMBULATORY_CARE_PROVIDER_SITE_OTHER): Payer: PRIVATE HEALTH INSURANCE | Admitting: Psychiatry

## 2014-06-20 DIAGNOSIS — F3289 Other specified depressive episodes: Secondary | ICD-10-CM

## 2014-06-20 DIAGNOSIS — F32A Depression, unspecified: Secondary | ICD-10-CM

## 2014-06-20 DIAGNOSIS — F329 Major depressive disorder, single episode, unspecified: Secondary | ICD-10-CM
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
## 2014-06-20 NOTE — Patient Instructions (Signed)
Discussed orally 

## 2014-06-23 ENCOUNTER — Encounter (INDEPENDENT_AMBULATORY_CARE_PROVIDER_SITE_OTHER): Payer: Self-pay

## 2014-06-23 ENCOUNTER — Encounter: Payer: Self-pay | Admitting: Family Medicine

## 2014-06-23 ENCOUNTER — Ambulatory Visit (INDEPENDENT_AMBULATORY_CARE_PROVIDER_SITE_OTHER): Payer: PRIVATE HEALTH INSURANCE | Admitting: Family Medicine

## 2014-06-23 VITALS — BP 118/80 | HR 91 | Resp 16 | Ht 61.0 in | Wt 110.1 lb

## 2014-06-23 DIAGNOSIS — L600 Ingrowing nail: Secondary | ICD-10-CM

## 2014-06-23 DIAGNOSIS — N951 Menopausal and female climacteric states: Secondary | ICD-10-CM

## 2014-06-23 DIAGNOSIS — I10 Essential (primary) hypertension: Secondary | ICD-10-CM

## 2014-06-23 DIAGNOSIS — E041 Nontoxic single thyroid nodule: Secondary | ICD-10-CM

## 2014-06-23 DIAGNOSIS — Z0000 Encounter for general adult medical examination without abnormal findings: Secondary | ICD-10-CM

## 2014-06-23 DIAGNOSIS — E785 Hyperlipidemia, unspecified: Secondary | ICD-10-CM

## 2014-06-23 DIAGNOSIS — R21 Rash and other nonspecific skin eruption: Secondary | ICD-10-CM

## 2014-06-23 MED ORDER — VENLAFAXINE HCL ER 75 MG PO CP24: 75.0000 mg | ORAL_CAPSULE | Freq: Every day | ORAL | Status: DC

## 2014-06-23 MED ORDER — METHYLPREDNISOLONE ACETATE 80 MG/ML IJ SUSP
80.0000 mg | Freq: Once | INTRAMUSCULAR | Status: AC
  Administered 2014-06-23: 80 mg via INTRAMUSCULAR

## 2014-06-23 MED ORDER — MONTELUKAST SODIUM 10 MG PO TABS: 10.0000 mg | ORAL_TABLET | Freq: Every day | ORAL | Status: DC

## 2014-06-23 MED ORDER — BENAZEPRIL HCL 10 MG PO TABS: 10.0000 mg | ORAL_TABLET | Freq: Every morning | ORAL | Status: DC

## 2014-06-23 MED ORDER — ROSUVASTATIN CALCIUM 10 MG PO TABS: 10.0000 mg | ORAL_TABLET | Freq: Every day | ORAL | Status: DC

## 2014-06-23 MED ORDER — PREDNISONE (PAK) 5 MG PO TABS: 5.0000 mg | ORAL_TABLET | ORAL | Status: DC

## 2014-06-23 MED ORDER — ESOMEPRAZOLE MAGNESIUM 40 MG PO CPDR: DELAYED_RELEASE_CAPSULE | ORAL | Status: DC

## 2014-06-23 MED ORDER — TRIAMTERENE-HCTZ 37.5-25 MG PO TABS: 1.0000 | ORAL_TABLET | Freq: Every day | ORAL | Status: DC

## 2014-06-23 NOTE — Assessment & Plan Note (Addendum)
Podiatry to eval and treat. No infection noted at thsi time, no antibiotic prescribed

## 2014-06-23 NOTE — Assessment & Plan Note (Signed)
Still uncontrolled, increase effexor dose

## 2014-06-23 NOTE — Assessment & Plan Note (Addendum)
Needs f/u US to re eval nodule, referral made

## 2014-06-23 NOTE — Assessment & Plan Note (Signed)
Annual exam as documented. Counseling done  re healthy lifestyle involving commitment to 150 minutes exercise per week, heart healthy diet, and attaining healthy weight.The importance of adequate sleep also discussed. Regular seat belt use and safe storage  of firearms if patient has them, is also discussed. Changes in health habits are decided on by the patient with goals and time frames  set for achieving them. Immunization and cancer screening needs are specifically addressed at this visit.  

## 2014-06-23 NOTE — Patient Instructions (Addendum)
Annual physical exam end November, call if you need me before  Call in September for flu vaccine   Injection in io office today for rash and prednisone prescribed.  Higher dose of effexor for hot flashes  You are referred for [place] of neck   You are referred to podiatrist   Start walking/ exercising for 30 mins at least 5 days per week, and try to increase vegetable intake  Fasting lipid, cmp, CBC and TSH in November before f/u   Stop celebrex on completion of current and use tylenol 325 mg up to 2 daily for arthritic pain. This is OTC

## 2014-06-23 NOTE — Progress Notes (Signed)
Subjective:    Patient ID: Toni Miller, female    DOB: [birth year], 53 y.o.   MRN: [id number]  HPI  Preventive Screening-Counseling & Management   Patient present here today for a Medicare annual wellness visit. Has severaL other concerns which are voiced and addressed also C/o generalized rash involving trunk and upper extremities in the past week, nlo specific irritant identified. However does have poison oak dermatitis , and thinks she may have recently been exposed. Denies difficulty with breathing or swallowing C/o pain and swelling with ingrown toenail, no drainage, wants this removed. No fever or chills C/o uncontrolled disabling hot flashes , minor improvement with effexor which she is tolerating well C/o neck swelling h/o thyroid nodule , wants this checked   Current Problems (verified)   Medications Prior to Visit Allergies (verified)   PAST HISTORY  Family History (verified)  Social History Disabled, at age 89, married but currently seperated. Has 3 children. Son lives with her    Risk Factors  Current exercise habits:  Walks up the street a few days a week about 2 miles, only once per week  Dietary issues discussed: Heart healthy diet, low carb intake and more fruits and vegetables    Cardiac risk factors: none significant  Depression Screen  (Note: if answer to either of the following is "Yes", a more complete depression screening is indicated)   Over the past two weeks, have you felt down, depressed or hopeless? No  Over the past two weeks, have you felt little interest or pleasure in doing things? No  Have you lost interest or pleasure in daily life? No  Do you often feel hopeless? No  Do you cry easily over simple problems? No   Activities of Daily Living  In your present state of health, do you have any difficulty performing the following activities?  Driving?: Doesn't have a license. Children take her places or rides the [place]  Managing money?:  No Feeding yourself?:No Getting from bed to chair?:No Climbing a flight of stairs?:No Preparing food and eating?:No Bathing or showering?:No Getting dressed?:No Getting to the toilet?:No Using the toilet?:No Moving around from place to place?: No  Fall Risk Assessment In the past year have you fallen or had a near fall?:No Are you currently taking any medications that make you dizzy?:No   Hearing Difficulties: No Do you often ask people to speak up or repeat themselves?: No  Do you experience ringing or noises in your ears?:No Do you have difficulty understanding soft or whispered voices?:No  Cognitive Testing  Alert? Yes Normal Appearance?Yes  Oriented to person? Yes Place? Yes  Time? Yes  Displays appropriate judgment?Yes  Can read the correct time from a watch face? yes Are you having problems remembering things? Sometimes  Advanced Directives have been discussed with the patient?Yes. Brochure given , pt is full code at this time   List the Names of Other Physician/Practitioners you currently use: Dr.Mark   Indicate any recent Medical Services you may have received from other than [facility] providers in the past year (date may be approximate).   Assessment:    Annual Wellness Exam   Plan:     Medicare Attestation  I have personally reviewed:  The patient's medical and social history  Their use of alcohol, tobacco or illicit drugs  Their current medications and supplements  The patient's functional ability including ADLs,fall risks, home safety risks, cognitive, and hearing and visual impairment  Diet and physical activities  Evidence for  depression or mood disorders  The patient's weight, height, BMI, and visual acuity have been recorded in the chart. I have made referrals, counseling, and provided education to the patient based on review of the above and I have provided the patient with a written personalized care plan for preventive services.     Review of  Systems See HPI    Objective:   Physical Exam  BP 118/80  Pulse 91  Resp 16  Ht 5\' 1"  (1.549 m)  Wt 110 lb 1.9 oz (49.95 kg)  BMI 20.82 kg/m2  SpO2 99% Patient alert and oriented and in no cardiopulmonary distress.  HEENT: No facial asymmetry, EOMI,   oropharynx pink and moist.  Neck supple no JVD, thyromegaly, non tender. No obsruction of oropharyngeal airway, despite rash  Chest: Clear to auscultation bilaterally.   Skin: maculo papular erythematous rash on upoer trunk and  both upper extremities. No drainage from rash, no sign of bacterial superinfection. Ingrown  Great toenail which is also split, tender, no drainage from the nail  Psych: Good eye contact, normal affect. Memory intact not anxious or depressed appearing.         Assessment & Plan:  Medicare annual wellness visit, initial Annual exam as documented. Counseling done  re healthy lifestyle involving commitment to 150 minutes exercise per week, heart healthy diet, and attaining healthy weight.The importance of adequate sleep also discussed. Regular seat belt use and safe storage  of firearms if patient has them, is also discussed. Changes in health habits are decided on by the patient with goals and time frames  set for achieving them. Immunization and cancer screening needs are specifically addressed at this visit.   Rash and nonspecific skin eruption 1 week h/o rash on back and upper extremities, no fever, h/o poison oak dernatiiotis Depomedrol in office and pred dose pack  Ingrown toenail without infection Podiatry to eval and treat. No infection noted at thsi time, no antibiotic prescribed  Hot flashes, menopausal Still uncontrolled, increase effexor dose  Thyroid nodule Needs f/u US to re eval nodule, referral made  Allergic dermatitis 1 week history, unknown allergen, no c/p distress, depo medrol in office followed by short course of oral steroid

## 2014-06-23 NOTE — Assessment & Plan Note (Addendum)
1 week h/o rash on back and upper extremities, no fever, h/o poison oak dernatiiotis Depomedrol in office and pred dose pack

## 2014-06-29 DIAGNOSIS — L239 Allergic contact dermatitis, unspecified cause: Secondary | ICD-10-CM | POA: Insufficient documentation

## 2014-06-29 NOTE — Assessment & Plan Note (Signed)
1 week history, unknown allergen, no c/p distress, depo medrol in office followed by short course of oral steroid

## 2014-06-30 ENCOUNTER — Ambulatory Visit (HOSPITAL_COMMUNITY)
Admission: RE | Admit: 2014-06-30 | Discharge: 2014-06-30 | Disposition: A | Discharge status: Home / Self Care | Payer: PRIVATE HEALTH INSURANCE | Source: Ambulatory Visit | Attending: Family Medicine | Admitting: Family Medicine

## 2014-06-30 DIAGNOSIS — E042 Nontoxic multinodular goiter: Secondary | ICD-10-CM | POA: Diagnosis not present

## 2014-06-30 DIAGNOSIS — E041 Nontoxic single thyroid nodule: Secondary | ICD-10-CM | POA: Diagnosis present

## 2014-08-11 ENCOUNTER — Ambulatory Visit (INDEPENDENT_AMBULATORY_CARE_PROVIDER_SITE_OTHER): Payer: PRIVATE HEALTH INSURANCE

## 2014-08-11 DIAGNOSIS — Z23 Encounter for immunization: Secondary | ICD-10-CM

## 2014-08-12 ENCOUNTER — Other Ambulatory Visit: Payer: Self-pay | Admitting: Family Medicine

## 2014-08-12 DIAGNOSIS — Z1231 Encounter for screening mammogram for malignant neoplasm of breast: Secondary | ICD-10-CM

## 2014-08-15 ENCOUNTER — Ambulatory Visit (HOSPITAL_COMMUNITY)
Admission: RE | Admit: 2014-08-15 | Discharge: 2014-08-15 | Disposition: A | Discharge status: Home / Self Care | Payer: PRIVATE HEALTH INSURANCE | Source: Ambulatory Visit | Attending: Family Medicine | Admitting: Family Medicine

## 2014-08-15 DIAGNOSIS — Z1231 Encounter for screening mammogram for malignant neoplasm of breast: Secondary | ICD-10-CM

## 2014-09-26 LAB — CBC
HCT: 40.6 % (ref 36.0–46.0)
HEMOGLOBIN: 14.1 g/dL (ref 12.0–15.0)
MCH: 30.1 pg (ref 26.0–34.0)
MCHC: 34.7 g/dL (ref 30.0–36.0)
MCV: 86.6 fL (ref 78.0–100.0)
MPV: 9.1 fL — ABNORMAL LOW (ref 9.4–12.4)
Platelets: 234 10*3/uL (ref 150–400)
RBC: 4.69 MIL/uL (ref 3.87–5.11)
RDW: 13.4 % (ref 11.5–15.5)
WBC: 5.7 10*3/uL (ref 4.0–10.5)

## 2014-09-27 LAB — TSH: TSH: 1.345 u[IU]/mL (ref 0.350–4.500)

## 2014-09-27 LAB — COMPREHENSIVE METABOLIC PANEL
ALT: 13 U/L (ref 0–35)
AST: 17 U/L (ref 0–37)
Albumin: 4.4 g/dL (ref 3.5–5.2)
Alkaline Phosphatase: 54 U/L (ref 39–117)
BUN: 18 mg/dL (ref 6–23)
CALCIUM: 9.5 mg/dL (ref 8.4–10.5)
CO2: 30 mEq/L (ref 19–32)
Chloride: 98 mEq/L (ref 96–112)
Creat: 0.77 mg/dL (ref 0.50–1.10)
Glucose, Bld: 76 mg/dL (ref 70–99)
POTASSIUM: 4.1 meq/L (ref 3.5–5.3)
SODIUM: 138 meq/L (ref 135–145)
TOTAL PROTEIN: 7.3 g/dL (ref 6.0–8.3)
Total Bilirubin: 0.4 mg/dL (ref 0.2–1.2)

## 2014-09-27 LAB — LIPID PANEL
Cholesterol: 172 mg/dL (ref 0–200)
HDL: 75 mg/dL (ref >39)
LDL Cholesterol: 83 mg/dL (ref 0–99)
Total CHOL/HDL Ratio: 2.3 Ratio
Triglycerides: 68 mg/dL (ref <150)
VLDL: 14 mg/dL (ref 0–40)

## 2014-10-01 ENCOUNTER — Telehealth: Payer: Self-pay | Admitting: Family Medicine

## 2014-10-01 NOTE — Telephone Encounter (Addendum)
Yes pls explain that the same way she has arthritis and piochd nerves in her neck, probabaly has this in low back and I recommend gabapentin 300mg  one at bedtime #30 refill 3, offer appt for eval in next several weeks if she wishe this pls

## 2014-10-01 NOTE — Telephone Encounter (Signed)
Can patient try gabapentin for restless leg syndrome?

## 2014-10-08 ENCOUNTER — Other Ambulatory Visit: Payer: Self-pay

## 2014-10-08 MED ORDER — GABAPENTIN 300 MG PO CAPS: 300.0000 mg | ORAL_CAPSULE | Freq: Every day | ORAL | Status: DC

## 2014-10-08 NOTE — Telephone Encounter (Signed)
Pt aware med sent 

## 2014-10-14 ENCOUNTER — Other Ambulatory Visit: Payer: Self-pay | Admitting: Family Medicine

## 2014-10-23 ENCOUNTER — Encounter: Payer: Self-pay | Admitting: Family Medicine

## 2014-10-23 ENCOUNTER — Other Ambulatory Visit (HOSPITAL_COMMUNITY)
Admission: RE | Admit: 2014-10-23 | Discharge: 2014-10-23 | Disposition: A | Discharge status: Home / Self Care | Payer: PRIVATE HEALTH INSURANCE | Source: Ambulatory Visit | Attending: Family Medicine | Admitting: Family Medicine

## 2014-10-23 ENCOUNTER — Ambulatory Visit (INDEPENDENT_AMBULATORY_CARE_PROVIDER_SITE_OTHER): Payer: PRIVATE HEALTH INSURANCE | Admitting: Family Medicine

## 2014-10-23 VITALS — BP 104/72 | HR 72 | Resp 16 | Ht 61.0 in | Wt 116.0 lb

## 2014-10-23 DIAGNOSIS — N760 Acute vaginitis: Secondary | ICD-10-CM | POA: Insufficient documentation

## 2014-10-23 DIAGNOSIS — E785 Hyperlipidemia, unspecified: Secondary | ICD-10-CM

## 2014-10-23 DIAGNOSIS — K649 Unspecified hemorrhoids: Secondary | ICD-10-CM

## 2014-10-23 DIAGNOSIS — Z0000 Encounter for general adult medical examination without abnormal findings: Secondary | ICD-10-CM

## 2014-10-23 DIAGNOSIS — Z1211 Encounter for screening for malignant neoplasm of colon: Secondary | ICD-10-CM

## 2014-10-23 DIAGNOSIS — Z113 Encounter for screening for infections with a predominantly sexual mode of transmission: Secondary | ICD-10-CM | POA: Diagnosis present

## 2014-10-23 DIAGNOSIS — K648 Other hemorrhoids: Secondary | ICD-10-CM

## 2014-10-23 LAB — POC HEMOCCULT BLD/STL (OFFICE/1-CARD/DIAGNOSTIC): FECAL OCCULT BLD: NEGATIVE

## 2014-10-23 NOTE — Assessment & Plan Note (Signed)

## 2014-10-23 NOTE — Patient Instructions (Addendum)
F/u in 4.5 month, call if you need me before  You are referred to Dr Oneida Alar re piles  Recent blood work was excellent  We will contact you re recent swabs  All the best for 2016  No splinter in your leg  Fasting lipid and CMP in 4.5 month

## 2014-10-26 DIAGNOSIS — N760 Acute vaginitis: Secondary | ICD-10-CM | POA: Insufficient documentation

## 2014-10-26 NOTE — Progress Notes (Signed)
   Subjective:    Patient ID: Toni Miller, female    DOB: 02/19/1961, 53 y.o.   MRN: [id number]  HPI Patient is in for annual physical exam. No other health concerns are expressed or addressed at the visit.    Review of Systems See HPI     Objective:   Physical Exam BP 104/72 mmHg  Pulse 72  Resp 16  Ht 5\' 1"  (1.549 m)  Wt 116 lb (52.617 kg)  BMI 21.93 kg/m2  SpO2 98%   Pleasant well nourished female, alert and oriented x 3, in no cardio-pulmonary distress. Afebrile. HEENT No facial trauma or asymetry. Sinuses non tender.  Extra occullar muscles intact, pupils equally reactive to light. External ears normal, tympanic membranes clear. Oropharynx moist, no exudate, fair dentition. Neck: supple, no adenopathy,JVD or thyromegaly.No bruits.  Chest: Clear to ascultation bilaterally.No crackles or wheezes. Non tender to palpation  Breast: No asymetry,no masses or lumps. No tenderness. No nipple discharge or inversion. No axillary or supraclavicular adenopathy  Cardiovascular system; Heart sounds normal,  S1 and  S2 ,no S3.  No murmur, or thrill. Apical beat not displaced Peripheral pulses normal.  Abdomen: Soft, non tender, no organomegaly or masses. No bruits. Bowel sounds normal. No guarding, tenderness or rebound.  Rectal:  Normal sphincter tone. Hemorrhoids. Guaiac negative stool.  GU: External genitalia normal female genitalia , female distribution of hair. No lesions. Urethral meatus normal in size, no  Prolapse, no lesions visibly  Present. Bladder non tender. Vagina pink and moist , with no visible lesions ,white  discharge present . Adequate pelvic support no  cystocele or rectocele noted Cervix pink and appears healthy, no lesions or ulcerations noted, no discharge noted from os Uterus absent, no adnexal masses, no  adnexal tenderness.   Musculoskeletal exam: Full ROM of spine, hips , shoulders and knees. No deformity ,swelling or crepitus  noted. No muscle wasting or atrophy.   Neurologic: Cranial nerves 2 to 12 intact. Power, tone ,sensation and reflexes normal throughout. No disturbance in gait. No tremor.  Skin: Intact, no ulceration, erythema , scaling or rash noted. Pigmentation normal throughout  Psych; Normal mood and affect. Judgement and concentration normal        Assessment & Plan:  Encounter for annual physical exam Annual exam as documented. Counseling done  re healthy lifestyle involving commitment to 150 minutes exercise per week, heart healthy diet, and attaining healthy weight.The importance of adequate sleep also discussed. Regular seat belt use and home safety, is also discussed. Changes in health habits are decided on by the patient with goals and time frames  set for achieving them. Immunization and cancer screening needs are specifically addressed at this visit.   Piles (hemorrhoids) Refer to GI for evaluation fpo possible banding  Vaginitis and vulvovaginitis Swabs sent for testing prior to treatment

## 2014-10-26 NOTE — Assessment & Plan Note (Signed)
Swabs sent for testing prior to treatment

## 2014-10-26 NOTE — Assessment & Plan Note (Signed)
Refer to GI for evaluation fpo possible banding

## 2014-10-27 ENCOUNTER — Encounter: Payer: Self-pay | Admitting: Gastroenterology

## 2014-10-27 LAB — CERVICOVAGINAL ANCILLARY ONLY
Chlamydia: NEGATIVE
NEISSERIA GONORRHEA: NEGATIVE

## 2014-10-28 LAB — CERVICOVAGINAL ANCILLARY ONLY
WET PREP (BD AFFIRM): NEGATIVE
WET PREP (BD AFFIRM): NEGATIVE
Wet Prep (BD Affirm): NEGATIVE

## 2014-12-03 ENCOUNTER — Other Ambulatory Visit: Payer: Self-pay | Admitting: Family Medicine

## 2014-12-09 ENCOUNTER — Ambulatory Visit (INDEPENDENT_AMBULATORY_CARE_PROVIDER_SITE_OTHER): Payer: Medicare Other | Admitting: Gastroenterology

## 2014-12-09 ENCOUNTER — Encounter: Payer: Self-pay | Admitting: Gastroenterology

## 2014-12-09 ENCOUNTER — Telehealth: Payer: Self-pay | Admitting: Gastroenterology

## 2014-12-09 VITALS — BP 120/81 | HR 75 | Temp 97.0°F | Ht 61.0 in | Wt 111.6 lb

## 2014-12-09 DIAGNOSIS — K5900 Constipation, unspecified: Secondary | ICD-10-CM | POA: Diagnosis not present

## 2014-12-09 DIAGNOSIS — K625 Hemorrhage of anus and rectum: Secondary | ICD-10-CM | POA: Diagnosis not present

## 2014-12-09 DIAGNOSIS — K5909 Other constipation: Secondary | ICD-10-CM

## 2014-12-09 DIAGNOSIS — K648 Other hemorrhoids: Secondary | ICD-10-CM | POA: Insufficient documentation

## 2014-12-09 MED ORDER — HYDROCORTISONE 2.5 % RE CREA: 1.0000 "application " | TOPICAL_CREAM | Freq: Two times a day (BID) | RECTAL | Status: DC

## 2014-12-09 NOTE — Progress Notes (Signed)
Referring Provider: [name], MD Primary Care Physician:  Tula Nakayama, MD  Primary GI: Dr. Oneida Alar   Chief Complaint  Patient presents with  . Hemorrhoids  . Constipation    HPI:   Toni Miller is a 54 y.o. female presenting today with a history of constipation and hemorrhoids. Last colonoscopy in July 2014 with mild diverticulosis, small internal hemorrhoids.   Taking Miralax every other day. Usually has a BM every other day. Sometimes hard stool. Intermittent low-volume painless hematochezia. Occasional itching/burning. Linzess too strong. Never tried Amitiza.     Past Medical History  Diagnosis Date  . GERD (gastroesophageal reflux disease)   . Hyperlipidemia   . Neck pain 7/09    WITH BULGING DISC-----RECIEVING EPIDURALS   . Sinusitis   . Constipation   . Cancer 1993    abnormal pap [facility] at [facility]  . Hypertension   . Glaucoma 1993    Past Surgical History  Procedure Laterality Date  . Tubal ligation  1991  . Bilateral eye surgery      for glaucoma  . Total abdominal hysterectomy w/ bilateral salpingoophorectomy  2007    [facility], [place]  . Cyst removed left breast /benign  2005    left, [facility]  . Abdominal exploration surgery  age 16    bowel obstruction, [facility]  . Abdominal hysterectomy  2007    [facility], [facility]  . Cataract extraction w/phaco  05/16/2011    Procedure: CATARACT EXTRACTION PHACO AND INTRAOCULAR LENS PLACEMENT (IOC);  Surgeon: Tonny Branch;  Location: [facility];  Service: Ophthalmology;  Laterality: Right;  . Repair imperforate anus / anorectoplasty      per patient  . Colonoscopy N/A 05/17/2013    Dr. Barnie Alderman diverticulosis was noted/small internal hemorrhoids  . Eye surgery  2010, 1992 approx    bilateral  . Breast surgery Left 2004    left partial mastectomy, [facility]    Current Outpatient Prescriptions  Medication Sig Dispense Refill  . albuterol (PROAIR HFA) 108 (90 BASE) MCG/ACT inhaler Inhale 2 puffs into the lungs every 6 (six) hours as  needed for wheezing or shortness of breath.    . benazepril (LOTENSIN) 10 MG tablet TAKE ONE TABLET BY MOUTH ONCE DAILY. 30 tablet 1  . calcium-vitamin D (OSCAL WITH D) 500-200 MG-UNIT per tablet Take 1 tablet by mouth 2 (two) times daily. 60 tablet 5  . CRESTOR 10 MG tablet TAKE (1) TABLET BY MOUTH AT BEDTIME. 30 tablet 1  . cycloSPORINE (RESTASIS) 0.05 % ophthalmic emulsion Place 1 drop into both eyes 2 (two) times daily.     Marland Kitchen docusate sodium (COLACE) 100 MG capsule Take 100 mg by mouth daily.    . fluticasone (FLONASE) 50 MCG/ACT nasal spray Place 2 sprays into the nose daily.    . montelukast (SINGULAIR) 10 MG tablet TAKE 1 TABLET BY MOUTH ONCE DAILY. 30 tablet 1  . Multiple Vitamin (MULTIVITAMIN) capsule Take 1 capsule by mouth daily.    Marland Kitchen NEXIUM 40 MG capsule TAKE ONE CAPSULE BY MOUTH ONCE DAILY. 30 capsule 1  . polyethylene glycol powder (GLYCOLAX/MIRALAX) powder MIX 1 CAPFUL (17G) IN 8 OUNCES OF JUICE/WATER AND DRINK ONCE DAILY. 255 g 3  . travoprost, benzalkonium, (TRAVATAN) 0.004 % ophthalmic solution Place 1 drop into both eyes at bedtime.      . triamterene-hydrochlorothiazide (MAXZIDE-25) 37.5-25 MG per tablet TAKE 1 TABLET BY MOUTH ONCE A DAY. 30 tablet 1  . venlafaxine XR (EFFEXOR-XR) 75 MG 24 hr capsule  Take 1 capsule (75 mg total) by mouth daily with breakfast. 30 capsule 5  . hydrocortisone (ANUSOL-HC) 2.5 % rectal cream Place 1 application rectally 2 (two) times daily. 30 g 1   No current facility-administered medications for this visit.    Allergies as of 12/09/2014  . (No Known Allergies)    Family History  Problem Relation Age of Onset  . Adopted: Yes  . Alcohol abuse Mother   . Cancer Mother 30    stomach  . Lung cancer Father   . Glaucoma Father   . Cancer Father   . Alcohol abuse Father   . Breast cancer Sister   . Cancer Sister 60  . Cancer Sister 33  . Heart disease Sister   . Colon cancer Neg Hx     History   Social History  . Marital Status:  Legally Separated    Spouse Name: N/A    Number of Children: 3  . Years of Education: N/A   Occupational History  . UNEMPLOYED 2011     disability  .     Social History Main Topics  . Smoking status: Former Smoker    Quit date: 05/12/1991  . Smokeless tobacco: Never Used  . Alcohol Use: Yes     Comment: drinks a beer 1 x month  . Drug Use: No  . Sexual Activity: Yes    Birth Control/ Protection: Surgical   Other Topics Concern  . None   Social History Narrative    Review of Systems: As mentioned in HPI.   Physical Exam: BP 120/81 mmHg  Pulse 75  Temp(Src) 97 F (36.1 C)  Ht 5\' 1"  (1.549 m)  Wt 111 lb 9.6 oz (50.621 kg)  BMI 21.10 kg/m2 General:   Alert and oriented. No distress noted. Pleasant and cooperative.  Head:  Normocephalic and atraumatic. Eyes:  Conjuctiva clear without scleral icterus. Mouth:  Oral mucosa pink and moist. Heart:  S1, S2 present without murmurs Abdomen:  +BS, soft, non-tender and non-distended. No rebound or guarding. No HSM or masses noted. Rectal: posterior right lateral non-thrombosed hemorrhoid, query, small prolapsing internal hemorrhoid, internal exam without mass Msk:  Symmetrical without gross deformities. Normal posture. Extremities:  Without edema. Neurologic:  Alert and  oriented x4;  grossly normal neurologically. Skin:  Intact without significant lesions or rashes. Psych:  Alert and cooperative. Normal mood and affect.

## 2014-12-09 NOTE — Telephone Encounter (Signed)
Patient seen today for hemorrhoids and has a question about her prescription (cream). Nurse not available. Transferred call to VM

## 2014-12-09 NOTE — Patient Instructions (Signed)
Start taking Anusol cream per rectum twice a day for 7-10 days.   Stop Miralax. Trial of Amitiza 1 gelcap WITH FOOD twice a day to avoid nausea. IF you like this, call our office and we will send in the prescription.  Return in 4-6 weeks to see Dr. Oneida Alar for possible banding if needed.

## 2014-12-09 NOTE — Telephone Encounter (Signed)
Pt wanted to know if she needed to buy the cream over the counter of it it was prescription. I told her it was prescription and had been sent to the pharmacy.

## 2014-12-11 NOTE — Assessment & Plan Note (Signed)
Likely secondary to known internal hemorrhoids. Return in 4-6 weeks for possible banding. Bowel regimen prescribed.

## 2014-12-11 NOTE — Assessment & Plan Note (Signed)
Previously taking Miralax. Trial of Amitiza 56mcg po BID; samples provided. Return in 4-6 weeks to discuss banding if persistent low-volume hematochezia. Patient with known internal hemorrhoids on recent colonoscopy.

## 2014-12-15 ENCOUNTER — Telehealth: Payer: Self-pay | Admitting: Family Medicine

## 2014-12-15 MED ORDER — ESOMEPRAZOLE MAGNESIUM 40 MG PO CPDR: DELAYED_RELEASE_CAPSULE | ORAL | Status: DC

## 2014-12-15 NOTE — Telephone Encounter (Signed)
Refill sent.

## 2014-12-16 NOTE — Progress Notes (Signed)
cc'ed to pcp °

## 2014-12-25 ENCOUNTER — Telehealth: Payer: Self-pay | Admitting: Gastroenterology

## 2014-12-25 NOTE — Telephone Encounter (Signed)
Pt said the Amitiza 8 mcg bid worked very well. Please send Rx to pharmacy.

## 2014-12-25 NOTE — Telephone Encounter (Signed)
PATIENT GIVEN SAMPLES LAST TIME SHE WAS HERE BELIEVES IT WAS AMITIZA AND SHE WOULD LIKE TO HAVE A PRESCRIPTION CALLED IN TO HER PHARMACY    [facility] IN [place]

## 2014-12-26 MED ORDER — LUBIPROSTONE 8 MCG PO CAPS: 8.0000 ug | ORAL_CAPSULE | Freq: Two times a day (BID) | ORAL | Status: DC

## 2014-12-26 NOTE — Addendum Note (Signed)
Addended by: Mahala Menghini on: 12/26/2014 12:28 PM   Modules accepted: Orders

## 2014-12-29 ENCOUNTER — Telehealth: Payer: Self-pay

## 2014-12-31 NOTE — Telephone Encounter (Signed)
Patient states she has gotten some meds and she feels better

## 2015-01-01 NOTE — Telephone Encounter (Signed)
Patient states that she uses some resolve cleaner and that night she had sharp pains on left side of her head that didn't last but a couple seconds but happened a couple times. Told her to call back if it happened again

## 2015-01-08 ENCOUNTER — Ambulatory Visit (INDEPENDENT_AMBULATORY_CARE_PROVIDER_SITE_OTHER): Payer: Medicare Other | Admitting: Gastroenterology

## 2015-01-08 DIAGNOSIS — K648 Other hemorrhoids: Secondary | ICD-10-CM | POA: Diagnosis not present

## 2015-01-08 DIAGNOSIS — K5900 Constipation, unspecified: Secondary | ICD-10-CM

## 2015-01-08 DIAGNOSIS — K5909 Other constipation: Secondary | ICD-10-CM

## 2015-01-08 NOTE — Progress Notes (Signed)
PATIENT SCHEDULED AND AWARE OF APPT DATE AND TIME

## 2015-01-08 NOTE — Patient Instructions (Signed)
  DRINK WATER TO KEEP YOUR URINE LIGHT YELLOW.  FOLLOW A HIGH FIBER DIET. AVOID ITEMS THAT CAUSE DIARRHEA, BLOATING & GAS. SEE INFO BELOW.  USE MIRALAX AS NEEDED.  CONTINUE AMITIZA.  OPV IN 3 WEEKS FOR ADDITIONAL BANDING

## 2015-01-08 NOTE — Progress Notes (Addendum)
SYMPTOMS: Just rectal itching. RECTAL BLEEDING, PRESSURE, PAIN,BURNING. CONSTIPATION SINCE SHE WAS A CHILD. STATES SHE DID NOT HAVE A RECTUM AS A CHILD AND THEY HAD TO MAKE HER ONE. USING MIRALAX AND AMITIZA FOR BM. BMs: 3X/DAY.  CONSTIPATION: yes: sometimes DIARRHEA: no  STRAINS WITH BMs: YES  TIME SPENT ON TOILET: 20-30 MINS TISSUE POKES OUT OF RECTUM: no FIBER SUPPLEMENTS: NO  GLASSES OF WATER/DAY: 6-8-no   ADDITIONAL QUESTIONS:  LATEX ALLERGY: NO PREGNANT: NO ERECTILE DYSFUNCTION MEDS OR NITRATES: NO ANTICOAGULATION/ANTIPLATELET MEDS: NO DIAGNOSED WITH CROHN'S DISEASE, PROCTITIS, PORTAL HTN, OR ANAL/RECTAL CA: NO TAKING IMMUNOSUPPRESSANTS/XRT: NO  Plan 1. ANOSCOPY THEN CONSIDER [place] BAND TODAY.  DISCUSSED PROCEDURE, BENEFITS, AND RISKS AND INABILITY TO FIX EXTERNAL HEMORRHOIDS.

## 2015-01-08 NOTE — Assessment & Plan Note (Signed)
SYMPTOMS NOT CONTROLLED.  [place] BANDING TODAY

## 2015-01-08 NOTE — Addendum Note (Signed)
Addended by: Danie Binder on: 01/08/2015 10:40 AM   Modules accepted: Level of Service

## 2015-01-08 NOTE — Progress Notes (Signed)
REVIEWED-NO ADDITIONAL RECOMMENDATIONS. 

## 2015-01-08 NOTE — Assessment & Plan Note (Signed)
SYMPTOMS FAIRLY WELL CONTROLLED.   DRINK WATER TO KEEP YOUR URINE LIGHT YELLOW.  FOLLOW A HIGH FIBER DIET. AVOID ITEMS THAT CAUSE DIARRHEA, BLOATING & GAS. SEE INFO BELOW.  USE MIRALAX AS NEEDED.  CONTINUE AMITIZA.

## 2015-01-09 ENCOUNTER — Telehealth: Payer: Self-pay | Admitting: Family Medicine

## 2015-01-09 ENCOUNTER — Other Ambulatory Visit: Payer: Self-pay | Admitting: Family Medicine

## 2015-01-09 NOTE — Telephone Encounter (Signed)
Noted and med refilled  

## 2015-01-13 NOTE — Progress Notes (Signed)
cc'ed to pcp °

## 2015-01-28 ENCOUNTER — Ambulatory Visit (INDEPENDENT_AMBULATORY_CARE_PROVIDER_SITE_OTHER): Payer: Medicare Other | Admitting: Gastroenterology

## 2015-01-28 VITALS — BP 125/74 | HR 74 | Temp 98.2°F | Ht 61.0 in | Wt 113.0 lb

## 2015-01-28 DIAGNOSIS — K648 Other hemorrhoids: Secondary | ICD-10-CM | POA: Diagnosis not present

## 2015-01-28 NOTE — Progress Notes (Signed)
SYMPTOMS: bowel moving good. RECTAL ITCHING BETTER. NO RECTAL BLEEDING, PRESSURE, PAIN, ITCHING, BURNING.  CONSTIPATION: YES DIARRHEA: NO  STRAINS WITH BMs: NO  TIME SPENT ON TOILET: 15 MINS TISSUE POKES OUT OF RECTUM: NO FIBER SUPPLEMENTS: NO  GLASSES OF WATER/DAY: 6-8-YES   ADDITIONAL QUESTIONS:  LATEX ALLERGY: NO PREGNANT: NO ERECTILE DYSFUNCTION MEDS OR NITRATES: NO ANTICOAGULATION/ANTIPLATELET MEDS: NO DIAGNOSED WITH CROHN'S DISEASE, PROCTITIS, PORTAL HTN, OR ANAL/RECTAL CA: NO TAKING IMMUNOSUPPRESSANTS/XRT: NO  PLAN: 1. [facility] BANDING TODAY   PROCEDURE TECHNIQUE: BENEFITS RISK EXPLAINED TO PT. ONE [facility] BAND PLACED IN RIGHT ANTERIOR POSITION. POST-BANDING RECTAL EXAM REVEALED GOOD PLACEMENT. EXAM NON-TENDER.

## 2015-01-28 NOTE — Progress Notes (Signed)
   Subjective:    Patient ID: Toni Miller, female    DOB: 1961/07/19, 54 y.o.   MRN: [id number]  HPI   Past Medical History  Diagnosis Date  . GERD (gastroesophageal reflux disease)   . Hyperlipidemia   . Neck pain 7/09    WITH BULGING DISC-----RECIEVING EPIDURALS   . Sinusitis   . Constipation   . Cancer 1993    abnormal pap [facility] at [facility]  . Hypertension   . Glaucoma 1993    Review of Systems     Objective:   Physical Exam        Assessment & Plan:

## 2015-01-28 NOTE — Patient Instructions (Addendum)
DRINK WATER TO KEEP YOUR URINE LIGHT YELLOW.  EAT FIBER.  CONTINUE AMITIZA.  FOLLOW UP IN 3 WEEKS.

## 2015-01-28 NOTE — Assessment & Plan Note (Signed)
SYMPTOMS IMPROVED.  [place] BANDING IN 3 WEEKS

## 2015-02-09 ENCOUNTER — Other Ambulatory Visit: Payer: Self-pay | Admitting: Family Medicine

## 2015-02-19 ENCOUNTER — Ambulatory Visit (INDEPENDENT_AMBULATORY_CARE_PROVIDER_SITE_OTHER): Payer: Medicare Other | Admitting: Gastroenterology

## 2015-02-19 ENCOUNTER — Encounter: Payer: Self-pay | Admitting: Gastroenterology

## 2015-02-19 VITALS — BP 120/84 | HR 98 | Temp 97.1°F | Ht 62.0 in | Wt 116.6 lb

## 2015-02-19 DIAGNOSIS — K648 Other hemorrhoids: Secondary | ICD-10-CM

## 2015-02-19 NOTE — Progress Notes (Signed)
   Subjective:    Patient ID: Toni Miller, female    DOB: 10/04/1961, 54 y.o.   MRN: [id number]  HPI   Past Medical History  Diagnosis Date  . GERD (gastroesophageal reflux disease)   . Hyperlipidemia   . Neck pain 7/09    WITH BULGING DISC-----RECIEVING EPIDURALS   . Sinusitis   . Constipation   . Cancer 1993    abnormal pap treated at [facility]  . Hypertension   . Glaucoma 1993    Review of Systems     Objective:   Physical Exam        Assessment & Plan:

## 2015-02-19 NOTE — Assessment & Plan Note (Signed)
[place] BANDING COMPLETE.  [facility] BANDING IN 3 WEEKS-PLAN LEFT LAT [facility] BAND. MAY NEED TOTAL OF 5.

## 2015-02-19 NOTE — Patient Instructions (Signed)
DRINK WATER TO KEEP YOUR URINE LIGHT YELLOW.  EAT FIBER.  USE COLACE TO SOFTEN STOOL.  REPEAT [facility] BANDING IN 3 WEEKS. YOU NEED 1 OR 2 MORE SESSIONS.

## 2015-02-19 NOTE — Progress Notes (Signed)
SYMPTOMS: Saw RECTAL BLEEDING x1 AFTER A LARGE BM. NO RECTAL PRESSURE, PAIN, ITCHING, BURNING. BEFORE EVERY OTHER MONTH. CONSTIPATED EVERY OTHER MONTH.   CONSTIPATION: YES DIARRHEA: NO  STRAINS WITH BMs: YES  TIME SPENT ON TOILET: 20-25 MINS TISSUE POKES OUT OF RECTUM: NO FIBER SUPPLEMENTS: NO-TAKES MIRALAX GLASSES OF WATER/DAY: 6-8: NO    ADDITIONAL QUESTIONS:  LATEX ALLERGY: NO PREGNANT: NO ERECTILE DYSFUNCTION MEDS OR NITRATES: NO ANTICOAGULATION/ANTIPLATELET MEDS: NO DIAGNOSED WITH CROHN'S DISEASE, PROCTITIS, PORTAL HTN, OR ANAL/RECTAL CA: NO TAKING IMMUNOSUPPRESSANTS/XRT: NO  PLAN:  [facility] BANDING TODAY  PROCEDURE TECHNIQUE: BENEFITS RISK EXPLAINED TO PT.  ONE [facility] BAND PLACED IN RIGHT POSTERIOR POSITION. POST-BANDING RECTAL EXAM REVEALED GOOD PLACEMENT. EXAM NON-TENDER.

## 2015-02-21 ENCOUNTER — Other Ambulatory Visit: Payer: Self-pay | Admitting: Family Medicine

## 2015-02-21 DIAGNOSIS — Z1159 Encounter for screening for other viral diseases: Secondary | ICD-10-CM | POA: Diagnosis not present

## 2015-02-21 DIAGNOSIS — Z0000 Encounter for general adult medical examination without abnormal findings: Secondary | ICD-10-CM | POA: Diagnosis not present

## 2015-02-21 DIAGNOSIS — E785 Hyperlipidemia, unspecified: Secondary | ICD-10-CM | POA: Diagnosis not present

## 2015-02-22 LAB — LIPID PANEL
CHOLESTEROL: 168 mg/dL (ref 0–200)
HDL: 91 mg/dL (ref >=46)
LDL Cholesterol: 64 mg/dL (ref 0–99)
TRIGLYCERIDES: 65 mg/dL (ref <150)
Total CHOL/HDL Ratio: 1.8 Ratio
VLDL: 13 mg/dL (ref 0–40)

## 2015-02-22 LAB — COMPREHENSIVE METABOLIC PANEL
ALBUMIN: 4.2 g/dL (ref 3.5–5.2)
ALK PHOS: 54 U/L (ref 39–117)
ALT: 12 U/L (ref 0–35)
AST: 19 U/L (ref 0–37)
BILIRUBIN TOTAL: 0.5 mg/dL (ref 0.2–1.2)
BUN: 19 mg/dL (ref 6–23)
CALCIUM: 9.3 mg/dL (ref 8.4–10.5)
CO2: 32 mEq/L (ref 19–32)
Chloride: 98 mEq/L (ref 96–112)
Creat: 0.83 mg/dL (ref 0.50–1.10)
Glucose, Bld: 77 mg/dL (ref 70–99)
POTASSIUM: 3.6 meq/L (ref 3.5–5.3)
Sodium: 138 mEq/L (ref 135–145)
Total Protein: 7.1 g/dL (ref 6.0–8.3)

## 2015-02-24 ENCOUNTER — Other Ambulatory Visit: Payer: Self-pay | Admitting: Family Medicine

## 2015-02-24 ENCOUNTER — Ambulatory Visit (INDEPENDENT_AMBULATORY_CARE_PROVIDER_SITE_OTHER): Payer: Medicare Other | Admitting: Family Medicine

## 2015-02-24 ENCOUNTER — Encounter: Payer: Self-pay | Admitting: Family Medicine

## 2015-02-24 VITALS — BP 112/74 | HR 85 | Resp 16 | Ht 62.0 in | Wt 114.1 lb

## 2015-02-24 DIAGNOSIS — N951 Menopausal and female climacteric states: Secondary | ICD-10-CM

## 2015-02-24 DIAGNOSIS — B001 Herpesviral vesicular dermatitis: Secondary | ICD-10-CM | POA: Diagnosis not present

## 2015-02-24 DIAGNOSIS — K219 Gastro-esophageal reflux disease without esophagitis: Secondary | ICD-10-CM

## 2015-02-24 DIAGNOSIS — Z23 Encounter for immunization: Secondary | ICD-10-CM | POA: Diagnosis not present

## 2015-02-24 DIAGNOSIS — K5909 Other constipation: Secondary | ICD-10-CM

## 2015-02-24 DIAGNOSIS — J301 Allergic rhinitis due to pollen: Secondary | ICD-10-CM

## 2015-02-24 DIAGNOSIS — I10 Essential (primary) hypertension: Secondary | ICD-10-CM

## 2015-02-24 DIAGNOSIS — M170 Bilateral primary osteoarthritis of knee: Secondary | ICD-10-CM

## 2015-02-24 DIAGNOSIS — F32A Depression, unspecified: Secondary | ICD-10-CM

## 2015-02-24 DIAGNOSIS — M129 Arthropathy, unspecified: Secondary | ICD-10-CM

## 2015-02-24 DIAGNOSIS — E785 Hyperlipidemia, unspecified: Secondary | ICD-10-CM | POA: Diagnosis not present

## 2015-02-24 DIAGNOSIS — K5900 Constipation, unspecified: Secondary | ICD-10-CM

## 2015-02-24 DIAGNOSIS — F329 Major depressive disorder, single episode, unspecified: Secondary | ICD-10-CM | POA: Diagnosis not present

## 2015-02-24 MED ORDER — ESOMEPRAZOLE MAGNESIUM 40 MG PO CPDR: DELAYED_RELEASE_CAPSULE | ORAL | Status: DC

## 2015-02-24 MED ORDER — MONTELUKAST SODIUM 10 MG PO TABS: 10.0000 mg | ORAL_TABLET | Freq: Every day | ORAL | Status: DC

## 2015-02-24 MED ORDER — VENLAFAXINE HCL ER 75 MG PO CP24: ORAL_CAPSULE | ORAL | Status: DC

## 2015-02-24 MED ORDER — FLUTICASONE PROPIONATE 50 MCG/ACT NA SUSP: 2.0000 | Freq: Every day | NASAL | Status: DC

## 2015-02-24 MED ORDER — ACYCLOVIR 400 MG PO TABS: 400.0000 mg | ORAL_TABLET | Freq: Three times a day (TID) | ORAL | Status: DC

## 2015-02-24 MED ORDER — BENAZEPRIL HCL 10 MG PO TABS
10.0000 mg | ORAL_TABLET | Freq: Every day | ORAL | Status: DC
Start: 2015-02-24 — End: 2015-08-25

## 2015-02-24 MED ORDER — TRIAMTERENE-HCTZ 37.5-25 MG PO TABS: 1.0000 | ORAL_TABLET | Freq: Every day | ORAL | Status: DC

## 2015-02-24 MED ORDER — ROSUVASTATIN CALCIUM 10 MG PO TABS: ORAL_TABLET | ORAL | Status: DC

## 2015-02-24 NOTE — Patient Instructions (Addendum)
Annual wellness in early Sept, call if you need me before  TdAP  Today due to open sore on your lips  Acyclovir sent for fever blister.  Bowels seem to be doing well, eat an apple every day  It is important that you exercise regularly at least 30 minutes 5 times a week. If you develop chest pain, have severe difficulty breathing, or feel very tired, stop exercising immediately and seek medical attention   You are getting little arthritis in you knees  REPLENS is good for vaginal dryness  Thanks for choosing  Primary Care, we consider it a privelige to serve you.   LAbs are excellent  HIV blood test will be added to recent labs, you have had an abnormal pap,in the past and are active

## 2015-02-24 NOTE — Progress Notes (Signed)
Subjective:    Patient ID: Toni Miller, female    DOB: 1961-03-29, 54 y.o.   MRN: [id number]  HPI   Toni Miller     MRN: [id number]      DOB: 15-Dec-1960   HPI Toni Miller is here for follow up and re-evaluation of chronic medical conditions, medication management and review of any available recent lab and radiology data.  Preventive health is updated, specifically  Cancer screening and Immunization.   Questions or concerns regarding consultations or procedures which the PT has had in the interim are  addressed. The PT denies any adverse reactions to current medications since the last visit.  C/o 1 week outbreak of  Rash on lips and open sore on lower lip Increased pain and stiffness of knees, no instability or fall  ROS Denies recent fever or chills. Denies sinus pressure, nasal congestion, ear pain or sore throat. Denies chest congestion, productive cough or wheezing. Denies chest pains, palpitations and leg swelling Denies abdominal pain, nausea, vomiting,diarrhea or constipation.   Denies dysuria, frequency, hesitancy or incontinence.  Denies headaches, seizures, numbness, or tingling. Denies uncontroled depression, anxiety or insomnia.  PE  BP 112/74 mmHg  Pulse 85  Resp 16  Ht 5\' 2"  (1.575 m)  Wt 114 lb 1.9 oz (51.764 kg)  BMI 20.87 kg/m2  SpO2 99%  Patient alert and oriented and in no cardiopulmonary distress.  HEENT: No facial asymmetry, EOMI,   oropharynx pink and moist.  Neck supple no JVD, no mass.  Chest: Clear to auscultation bilaterally.  CVS: S1, S2 no murmurs, no S3.Regular rate.  ABD: Soft non tender.   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 Memory intact not anxious or depressed appearing.  CNS: CN 2-12 intact, power,  normal throughout.no focal deficits noted.   Assessment &  Plan   Fever blister Outbreak x 1 week, acyclovir prescribed  Need for Tdap vaccination Open sore on lower lip, TdAP administered  Depression Controlled, no change in medication    Hyperlipidemia with target LDL less than 100 Controlled, no change in medication Hyperlipidemia:Low fat diet discussed and encouraged.   Lipid Panel  Lab Results  Component Value Date   CHOL 168 02/21/2015   HDL 91 02/21/2015   LDLCALC 64 02/21/2015   TRIG 65 02/21/2015   CHOLHDL 1.8 02/21/2015        Essential hypertension Controlled, no change in medication DASH diet and commitment to daily physical activity for a minimum of 30 minutes discussed and encouraged, as a part of hypertension management. The importance of attaining a healthy weight is also discussed.  BP/Weight 04/08/2015 03/26/2015 03/12/2015 02/24/2015 02/19/2015 7/40/8144 06/07/8562  Systolic BP 149 702 637 858 850 277 412  Diastolic BP 73 73 90 74 84 74 81  Wt. (Lbs) 113.6 108 115 114.12 116.6 113 111.6  BMI 22.93 20.42 21.74 20.87 21.32 21.36 21.1        GERD Controlled, no change in medication   ALLERGIC RHINITIS, SEASONAL Controlled, no change in medication   Hot flashes, menopausal Improved with effexor continue same  Arthritis of both knees Advised tylenol as needed, for pain, and encouraged her to keep active  Chronic constipation Responds to medication per GI continue same  GLAUCOMA Closel;y supervised by specialist, pt compliant with treatment  Review of Systems     Objective:   Physical Exam        Assessment & Plan:

## 2015-02-26 LAB — HIV ANTIBODY (ROUTINE TESTING W REFLEX): HIV: NONREACTIVE

## 2015-03-02 ENCOUNTER — Other Ambulatory Visit: Payer: Self-pay | Admitting: Family Medicine

## 2015-03-02 ENCOUNTER — Telehealth: Payer: Self-pay | Admitting: Family Medicine

## 2015-03-02 MED ORDER — PREDNISONE 5 MG (21) PO TBPK: 5.0000 mg | ORAL_TABLET | ORAL | Status: DC

## 2015-03-02 MED ORDER — HYDROXYZINE PAMOATE 25 MG PO CAPS: 25.0000 mg | ORAL_CAPSULE | Freq: Three times a day (TID) | ORAL | Status: DC | PRN

## 2015-03-02 NOTE — Telephone Encounter (Signed)
I spoke directly with pt, denies difficlty breathing or swallowing, reports trash started as  soon as she started , recommended stop med, which she took even today, hydroxyzine and prednisone dose pack sent in

## 2015-03-02 NOTE — Telephone Encounter (Signed)
She believes that it is a reaction to the Acyclovir.  Instructed to stop medicine.   Would like to know what she can take for the itching and rash.  Please advise.

## 2015-03-05 ENCOUNTER — Telehealth: Payer: Self-pay | Admitting: *Deleted

## 2015-03-05 NOTE — Telephone Encounter (Signed)
Pt called stating she is still breaking out and itching. Please advise

## 2015-03-09 NOTE — Telephone Encounter (Signed)
appt scheduled for 05/03

## 2015-03-10 ENCOUNTER — Telehealth: Payer: Self-pay

## 2015-03-10 ENCOUNTER — Ambulatory Visit: Payer: Medicare Other

## 2015-03-10 ENCOUNTER — Encounter: Payer: Self-pay | Admitting: *Deleted

## 2015-03-10 DIAGNOSIS — R21 Rash and other nonspecific skin eruption: Secondary | ICD-10-CM

## 2015-03-10 MED ORDER — METHYLPREDNISOLONE ACETATE 80 MG/ML IJ SUSP
80.0000 mg | Freq: Once | INTRAMUSCULAR | Status: AC
  Administered 2015-03-10: 80 mg via INTRAMUSCULAR

## 2015-03-10 MED ORDER — HYDROXYZINE HCL 10 MG PO TABS: 10.0000 mg | ORAL_TABLET | Freq: Three times a day (TID) | ORAL | Status: DC | PRN

## 2015-03-10 NOTE — Telephone Encounter (Signed)
Pt aware.

## 2015-03-10 NOTE — Progress Notes (Unsigned)
Depo 80 mg IM given per Dr and hydroxyzine sent in and patient is aware

## 2015-03-10 NOTE — Telephone Encounter (Signed)
Itching all over. Redness on her chest and under breast. States she also has some bumps on her face and back that itch as well x 5-6 days. Please advise

## 2015-03-10 NOTE — Telephone Encounter (Signed)
Pls give depo medrol 80 mg IM , I am sending in  hydroxyzine also, thanks ( pred 5 mg dose pack was prescribed on 4/25 so I am sending no m more prednisone, in case she calls)

## 2015-03-12 ENCOUNTER — Encounter: Payer: Self-pay | Admitting: Gastroenterology

## 2015-03-12 ENCOUNTER — Ambulatory Visit (INDEPENDENT_AMBULATORY_CARE_PROVIDER_SITE_OTHER): Payer: Medicare Other | Admitting: Gastroenterology

## 2015-03-12 ENCOUNTER — Other Ambulatory Visit: Payer: Self-pay

## 2015-03-12 VITALS — BP 134/90 | HR 85 | Temp 97.4°F | Ht 61.0 in | Wt 115.0 lb

## 2015-03-12 DIAGNOSIS — K648 Other hemorrhoids: Secondary | ICD-10-CM

## 2015-03-12 NOTE — Patient Instructions (Signed)
FOLLOW A HIGH FIBER DIET. AVOID ITEMS THAT CAUSE BLOATING AND GAS. SEE INFO BELOW.  DRINK WATER TO KEEP HER URINE LIGHT YELLOW.  SIT FOR LESS THAN 5 MINUTES ON THE COMMODE.  FOLLOW UP IN JUN 2016.    High-Fiber Diet A high-fiber diet changes your normal diet to include more whole grains, legumes, fruits, and vegetables. Changes in the diet involve replacing refined carbohydrates with unrefined foods. The calorie level of the diet is essentially unchanged. The Dietary Reference Intake (recommended amount) for adult males is 38 grams per day. For adult females, it is 25 grams per day. Pregnant and lactating women should consume 28 grams of fiber per day. Fiber is the intact part of a plant that is not broken down during digestion. Functional fiber is fiber that has been isolated from the plant to provide a beneficial effect in the body. PURPOSE  Increase stool bulk.   Ease and regulate bowel movements.   Lower cholesterol.  INDICATIONS THAT YOU NEED MORE FIBER  Constipation and hemorrhoids.   Uncomplicated diverticulosis (intestine condition) and irritable bowel syndrome.   Weight management.   As a protective measure against hardening of the arteries (atherosclerosis), diabetes, and cancer.   DO NOT USE WITH:  Acute diverticulitis (intestine infection).   Partial small bowel obstructions.   Complicated diverticular disease involving bleeding, rupture (perforation), or abscess (boil, furuncle).   Presence of autonomic neuropathy (nerve damage) or gastroparesis (stomach cannot empty itself).    GUIDELINES FOR INCREASING FIBER IN THE DIET  Start adding fiber to the diet slowly. A gradual increase of about 5 more grams (2 slices of whole-wheat bread, 2 servings of most fruits or vegetables, or 1 bowl of high-fiber cereal) per day is best. Too rapid an increase in fiber may result in constipation, flatulence, and bloating.   Drink enough water and fluids to keep your urine  clear or pale yellow. Water, juice, or caffeine-free drinks are recommended. Not drinking enough fluid may cause constipation.   Eat a variety of high-fiber foods rather than one type of fiber.   Try to increase your intake of fiber through using high-fiber foods rather than fiber pills or supplements that contain small amounts of fiber.   The goal is to change the types of food eaten. Do not supplement your present diet with high-fiber foods, but replace foods in your present diet.    INCLUDE A VARIETY OF FIBER SOURCES  Replace refined and processed grains with whole grains, canned fruits with fresh fruits, and incorporate other fiber sources. White rice, white breads, and most bakery goods contain little or no fiber.   Brown whole-grain rice, buckwheat oats, and many fruits and vegetables are all good sources of fiber. These include: broccoli, Brussels sprouts, cabbage, cauliflower, beets, sweet potatoes, white potatoes (skin on), carrots, tomatoes, eggplant, squash, berries, fresh fruits, and dried fruits.   Cereals appear to be the richest source of fiber. Cereal fiber is found in whole grains and bran. Bran is the fiber-rich outer coat of cereal grain, which is largely removed in refining. In whole-grain cereals, the bran remains. In breakfast cereals, the largest amount of fiber is found in those with "bran" in their names. The fiber content is sometimes indicated on the label.   You may need to include additional fruits and vegetables each day.   In baking, for 1 cup white flour, you may use the following substitutions:   1 cup whole-wheat flour minus 2 tablespoons.   1/2 cup white  flour plus 1/2 cup whole-wheat flour.

## 2015-03-12 NOTE — Progress Notes (Addendum)
SYMPTOMS: no RECTAL BLEEDING, PRESSURE, PAIN. Better: ITCHING, BURNING.  CONSTIPATION: yes DIARRHEA: no  STRAINS WITH BMs: no  TIME SPENT ON TOILET: 15 MINS TISSUE POKES OUT OF RECTUM: no FIBER SUPPLEMENTS: NO  GLASSES OF WATER/DAY: 6-8-Trying   ADDITIONAL QUESTIONS:  LATEX ALLERGY: NO PREGNANT: NO ERECTILE DYSFUNCTION MEDS OR NITRATES: NO ANTICOAGULATION/ANTIPLATELET MEDS: NO DIAGNOSED WITH CROHN'S DISEASE, PROCTITIS, PORTAL HTN, OR ANAL/RECTAL CA: NO TAKING IMMUNOSUPPRESSANTS/XRT: NO  Plan: 1. [facility] L LAT BAND TODAY.   PROCEDURE TECHNIQUE: BENEFITS RISK EXPLAINED TO PT. ONE [facility] BAND PLACED IN RIGHT ANTERIOR POSITION. POST-BANDING RECTAL EXAM REVEALED GOOD PLACEMENT. EXAM NON-TENDER.

## 2015-03-12 NOTE — Assessment & Plan Note (Signed)
FOLLOW UP IN 1 MOS FOR L LAT BAND

## 2015-03-12 NOTE — Progress Notes (Signed)
   Subjective:    Patient ID: Toni Miller, female    DOB: 1961-07-25, 54 y.o.   MRN: [id number]  HPI   Review of Systems     Objective:   Physical Exam  Constitutional: She is oriented to person, place, and time. She appears well-developed and well-nourished. No distress.  HENT:  Head: Atraumatic.  Eyes: Pupils are equal, round, and reactive to light. No scleral icterus.  Cardiovascular: Normal rate, regular rhythm and normal heart sounds.   Pulmonary/Chest: Effort normal and breath sounds normal. No respiratory distress.  Abdominal: Soft. Bowel sounds are normal. She exhibits no distension. There is no tenderness.  Musculoskeletal: She exhibits no edema.  Neurological: She is alert and oriented to person, place, and time.  Psychiatric: She has a normal mood and affect.  Vitals reviewed.         Assessment & Plan:

## 2015-03-26 ENCOUNTER — Emergency Department (HOSPITAL_COMMUNITY): Payer: Medicare Other

## 2015-03-26 ENCOUNTER — Encounter (HOSPITAL_COMMUNITY): Payer: Self-pay

## 2015-03-26 ENCOUNTER — Telehealth: Payer: Self-pay

## 2015-03-26 ENCOUNTER — Emergency Department (HOSPITAL_COMMUNITY)
Admission: EM | Admit: 2015-03-26 | Discharge: 2015-03-26 | Disposition: A | Discharge status: Home / Self Care | Payer: Medicare Other | Attending: Emergency Medicine | Admitting: Emergency Medicine

## 2015-03-26 DIAGNOSIS — E785 Hyperlipidemia, unspecified: Secondary | ICD-10-CM | POA: Diagnosis not present

## 2015-03-26 DIAGNOSIS — Z87891 Personal history of nicotine dependence: Secondary | ICD-10-CM | POA: Diagnosis not present

## 2015-03-26 DIAGNOSIS — Z79899 Other long term (current) drug therapy: Secondary | ICD-10-CM | POA: Insufficient documentation

## 2015-03-26 DIAGNOSIS — K5900 Constipation, unspecified: Secondary | ICD-10-CM | POA: Diagnosis not present

## 2015-03-26 DIAGNOSIS — K219 Gastro-esophageal reflux disease without esophagitis: Secondary | ICD-10-CM | POA: Insufficient documentation

## 2015-03-26 DIAGNOSIS — Z8589 Personal history of malignant neoplasm of other organs and systems: Secondary | ICD-10-CM | POA: Insufficient documentation

## 2015-03-26 DIAGNOSIS — H53149 Visual discomfort, unspecified: Secondary | ICD-10-CM | POA: Insufficient documentation

## 2015-03-26 DIAGNOSIS — I10 Essential (primary) hypertension: Secondary | ICD-10-CM | POA: Diagnosis not present

## 2015-03-26 DIAGNOSIS — Z7951 Long term (current) use of inhaled steroids: Secondary | ICD-10-CM | POA: Diagnosis not present

## 2015-03-26 DIAGNOSIS — R51 Headache: Secondary | ICD-10-CM | POA: Insufficient documentation

## 2015-03-26 DIAGNOSIS — R519 Headache, unspecified: Secondary | ICD-10-CM

## 2015-03-26 MED ORDER — BUTALBITAL-APAP-CAFFEINE 50-325-40 MG PO TABS
1.0000 | ORAL_TABLET | Freq: Once | ORAL | Status: AC
  Administered 2015-03-26: 1 via ORAL
  Filled 2015-03-26: qty 1

## 2015-03-26 NOTE — Progress Notes (Signed)
CC'ED TO PCP 

## 2015-03-26 NOTE — ED Notes (Signed)
Pt c/o headache on left side of head off and on x 1 month.  Reports headache came back last night.  Denies n/v or visual changes.

## 2015-03-26 NOTE — Telephone Encounter (Signed)
Advised patient to seek care that the ED.

## 2015-03-26 NOTE — ED Provider Notes (Signed)
CSN: [id number]     Arrival date & time 03/26/15  1012 History   First MD Initiated Contact with Patient 03/26/15 1120     Chief Complaint  Patient presents with  . Headache     (Consider location/radiation/quality/duration/timing/severity/associated sxs/prior Treatment) HPI  Toni Miller is a 54 y.o. female who presents to the Emergency Department complaining of left sided headache intermittently for one month.  She states the headache returned on the night prior to arrival.  She describes a throbbing pain from behind the left ear to top of her head.  Pain is similar to previous.  She contacted her PMD and states she was advised to come to ED for evaluation.  She states she is concerned she may have a "brain tumor" and has not had previous evaluation for her headaches.  She denies fever, neck pain, visual changes, dizziness or neck stiffness.  She has tried OTC analgesics without relief.    Past Medical History  Diagnosis Date  . GERD (gastroesophageal reflux disease)   . Hyperlipidemia   . Neck pain 7/09    WITH BULGING DISC-----RECIEVING EPIDURALS   . Sinusitis   . Constipation   . Cancer 1993    abnormal pap [facility]  . Hypertension   . Glaucoma 1993   Past Surgical History  Procedure Laterality Date  . Tubal ligation  1991  . Bilateral eye surgery      for glaucoma  . Total abdominal hysterectomy w/ bilateral salpingoophorectomy  2007    [facility]  . Cyst removed left breast /benign  2005    left, [facility]  . Abdominal exploration surgery  age 55    bowel obstruction, [facility]  . Abdominal hysterectomy  2007    [facility]  . Cataract extraction w/phaco  05/16/2011    Procedure: CATARACT EXTRACTION PHACO AND INTRAOCULAR LENS PLACEMENT (IOC);  Surgeon: Tonny Branch;  Location: [facility];  Service: Ophthalmology;  Laterality: Right;  . Repair imperforate anus / anorectoplasty      per patient  . Colonoscopy N/A 05/17/2013    Dr. Barnie Alderman diverticulosis was noted/small internal  hemorrhoids  . Eye surgery  2010, 1992 approx    bilateral  . Breast surgery Left 2004    left partial mastectomy, [facility]   Family History  Problem Relation Age of Onset  . Adopted: Yes  . Alcohol abuse Mother   . Cancer Mother 30    stomach  . Lung cancer Father   . Glaucoma Father   . Cancer Father   . Alcohol abuse Father   . Breast cancer Sister   . Cancer Sister 2  . Cancer Sister 25  . Heart disease Sister   . Colon cancer Neg Hx    History  Substance Use Topics  . Smoking status: Former Smoker    Quit date: 05/12/1991  . Smokeless tobacco: Never Used  . Alcohol Use: Yes     Comment: drinks a beer 1 x month   OB History    No data available     Review of Systems  Constitutional: Negative for fever, activity change and appetite change.  HENT: Negative for facial swelling and trouble swallowing.   Eyes: Positive for photophobia. Negative for pain and visual disturbance.  Respiratory: Negative for shortness of breath.   Cardiovascular: Negative for chest pain.  Gastrointestinal: Negative for nausea and vomiting.  Musculoskeletal: Negative for neck pain and neck stiffness.  Skin: Negative for rash and wound.  Neurological: Positive  for headaches. Negative for dizziness, facial asymmetry, speech difficulty, weakness and numbness.  Psychiatric/Behavioral: Negative for confusion and decreased concentration.  All other systems reviewed and are negative.     Allergies  Acyclovir and related  Home Medications   Prior to Admission medications   Medication Sig Start Date End Date Taking? Authorizing Provider  benazepril (LOTENSIN) 10 MG tablet Take 1 tablet (10 mg total) by mouth daily. 02/24/15  Yes [name], MD  calcium-vitamin D (OSCAL WITH D) 500-200 MG-UNIT per tablet Take 1 tablet by mouth 2 (two) times daily. 04/02/14  Yes [name], MD  docusate sodium (COLACE) 100 MG capsule Take 100 mg by mouth daily.   Yes Historical Provider, MD   esomeprazole (NEXIUM) 40 MG capsule TAKE ONE CAPSULE BY MOUTH ONCE DAILY. 02/24/15  Yes [name], MD  fluticasone (FLONASE) 50 MCG/ACT nasal spray USE 2 SPRAYS IN EACH NOSTRIL ONCE DAILY. 02/24/15  Yes [name], MD  gabapentin (NEURONTIN) 300 MG capsule Take 300 mg by mouth at bedtime.   Yes Historical Provider, MD  hydrOXYzine (ATARAX/VISTARIL) 10 MG tablet Take 1 tablet (10 mg total) by mouth 3 (three) times daily as needed. Patient taking differently: Take 10 mg by mouth 3 (three) times daily as needed for itching.  03/10/15  Yes [name], MD  hydrOXYzine (VISTARIL) 25 MG capsule Take 1 capsule (25 mg total) by mouth 3 (three) times daily as needed. 03/02/15  Yes [name], MD  lubiprostone (AMITIZA) 8 MCG capsule Take 1 capsule (8 mcg total) by mouth 2 (two) times daily with a meal. 12/26/14  Yes Mahala Menghini, PA-C  montelukast (SINGULAIR) 10 MG tablet Take 1 tablet (10 mg total) by mouth daily. 02/24/15  Yes [name], MD  Multiple Vitamin (MULTIVITAMIN) capsule Take 1 capsule by mouth daily.   Yes Historical Provider, MD  polyethylene glycol powder (GLYCOLAX/MIRALAX) powder MIX 1 CAPFUL (17G) IN 8 OUNCES OF JUICE/WATER AND DRINK ONCE DAILY. 10/14/14  Yes [name], MD  rosuvastatin (CRESTOR) 10 MG tablet TAKE (1) TABLET BY MOUTH AT BEDTIME. 02/24/15  Yes [name], MD  travoprost, benzalkonium, (TRAVATAN) 0.004 % ophthalmic solution Place 1 drop into both eyes at bedtime.     Yes Historical Provider, MD  triamterene-hydrochlorothiazide (MAXZIDE-25) 37.5-25 MG per tablet Take 1 tablet by mouth daily. 02/24/15  Yes [name], MD  venlafaxine XR (EFFEXOR-XR) 75 MG 24 hr capsule TAKE ONE CAPSULE BY MOUTH DAILY WITH BREAKFAST. 02/24/15  Yes [name], MD  predniSONE (STERAPRED UNI-PAK 21 TAB) 5 MG (21) TBPK tablet Take 1 tablet (5 mg total) by mouth as directed. Use as directed Patient not taking: Reported on 03/26/2015  03/02/15   [name], MD   BP 124/73 mmHg  Pulse 86  Temp(Src) 98.4 F (36.9 C) (Oral)  Resp 18  Ht 5\' 1"  (1.549 m)  Wt 108 lb (48.988 kg)  BMI 20.42 kg/m2  SpO2 100% Physical Exam  Constitutional: She is oriented to person, place, and time. She appears well-developed and well-nourished. No distress.  HENT:  Head: Normocephalic and atraumatic.  Mouth/Throat: Oropharynx is clear and moist.  Eyes: EOM are normal. Pupils are equal, round, and reactive to light.  Neck: Normal range of motion and phonation normal. Neck supple. No spinous process tenderness and no muscular tenderness present. No rigidity. No Kernig's sign noted.  Cardiovascular: Normal rate, regular rhythm, normal heart sounds and intact distal pulses.  No murmur heard. Pulmonary/Chest: Effort normal and breath sounds normal. No respiratory distress.  Musculoskeletal: Normal range of motion.  Neurological: She is alert and oriented to person, place, and time. She has normal strength. No cranial nerve deficit or sensory deficit. She exhibits normal muscle tone. Coordination and gait normal. GCS eye subscore is 4. GCS verbal subscore is 5. GCS motor subscore is 6.  Reflex Scores:      Tricep reflexes are 2+ on the right side and 2+ on the left side.      Bicep reflexes are 2+ on the right side and 2+ on the left side. Skin: Skin is warm and dry.  Psychiatric: She has a normal mood and affect.  Nursing note and vitals reviewed.   ED Course  Procedures (including critical care time) Labs Review Labs Reviewed - No data to display  Imaging Review Ct Head Wo Contrast  03/26/2015   CLINICAL DATA:  Left side headache for 1 month  EXAM: CT HEAD WITHOUT CONTRAST  TECHNIQUE: Contiguous axial images were obtained from the base of the skull through the vertex without intravenous contrast.  COMPARISON:  None  FINDINGS: No skull fracture is noted. Paranasal sinuses and mastoid air cells are unremarkable. No intracranial  hemorrhage, mass effect or midline shift.  No acute cortical infarction. No hydrocephalus. No mass lesion is noted on this unenhanced scan.  IMPRESSION: No acute intracranial abnormality.   Electronically Signed   By: Lahoma Crocker M.D.   On: 03/26/2015 12:45     EKG Interpretation None      MDM   Final diagnoses:  Left-sided headache     1300  Pt is feeling better and ready for d/c.  No focal neuro deficits. Ambulates with steady gait.  No meningismus  or focal neuro deficits.     Kem Parkinson, PA-C 03/28/15 1551  Tanna Furry, MD 04/04/15 [phone]

## 2015-03-26 NOTE — Discharge Instructions (Signed)

## 2015-03-30 ENCOUNTER — Ambulatory Visit: Payer: Medicare Other | Admitting: Family Medicine

## 2015-04-03 DIAGNOSIS — H04129 Dry eye syndrome of unspecified lacrimal gland: Secondary | ICD-10-CM | POA: Diagnosis not present

## 2015-04-08 ENCOUNTER — Ambulatory Visit (INDEPENDENT_AMBULATORY_CARE_PROVIDER_SITE_OTHER): Payer: Medicare Other | Admitting: Gastroenterology

## 2015-04-08 ENCOUNTER — Encounter (INDEPENDENT_AMBULATORY_CARE_PROVIDER_SITE_OTHER): Payer: Self-pay

## 2015-04-08 ENCOUNTER — Encounter: Payer: Self-pay | Admitting: Gastroenterology

## 2015-04-08 VITALS — BP 110/73 | HR 84 | Temp 97.6°F | Ht 59.0 in | Wt 113.6 lb

## 2015-04-08 DIAGNOSIS — K5900 Constipation, unspecified: Secondary | ICD-10-CM | POA: Diagnosis not present

## 2015-04-08 DIAGNOSIS — K648 Other hemorrhoids: Secondary | ICD-10-CM | POA: Diagnosis not present

## 2015-04-08 DIAGNOSIS — K5909 Other constipation: Secondary | ICD-10-CM

## 2015-04-08 NOTE — Assessment & Plan Note (Signed)
SYMPTOMS CONTROLLED/RESOLVED.  CONTINUE TO MONITOR SYMPTOMS. Drink water EAT FIBER FOLLOW UP IN 1 YEAR.

## 2015-04-08 NOTE — Patient Instructions (Signed)
DRINK WATER TO KEEP YOUR URINE LIGHT YELLOW.  FOLLOW A HIGH FIBER DIET. AVOID ITEMS THAT CAUSE BLOATING & GAS. SEE INFO BELOW.  TRY AMITIZA 24 MCG PILLS TWICE DAILY WITH FOOD.  CALL IF YOU WOULD LIKE YOUR PRESCRIPTION CHANGED.  FOLLOW UP IN 1 YEAR.   PLEASE CALL WITH QUESTIONS OR CONCERNS.

## 2015-04-08 NOTE — Progress Notes (Signed)
Subjective:    Patient ID: Toni Miller, female    DOB: December 10, 1960, 54 y.o.   MRN: [id number]  Tula Nakayama, MD  HPI No questions or concerns. BMs: EVERY 1-2 DAYS.  MOST OF THE TIME FEELS LIKE SHE COMPLETELY EVACUATES. NO RECTAL PRESSURE, PAIN, ITCHING, BURNING, SOILING. HEARTBURN: ONCE A WEEK.   PT DENIES FEVER, CHILLS, HEMATOCHEZIA, nausea, vomiting, melena, diarrhea, CHEST PAIN, SHORTNESS OF BREATH,  CHANGE IN BOWEL IN HABITS, abdominal pain, OR problems swallowing.   Past Medical History  Diagnosis Date  . GERD (gastroesophageal reflux disease)   . Hyperlipidemia   . Neck pain 7/09    WITH BULGING DISC-----RECIEVING EPIDURALS   . Sinusitis   . Constipation   . Cancer 1993    abnormal pap [facility] at [facility]  . Hypertension   . Glaucoma 1993   Past Surgical History  Procedure Laterality Date  . Tubal ligation  1991  . Bilateral eye surgery      for glaucoma  . Total abdominal hysterectomy w/ bilateral salpingoophorectomy  2007    [facility]  . Cyst removed left breast /benign  2005    left, [facility]  . Abdominal exploration surgery  age 22    bowel obstruction, [facility]  . Abdominal hysterectomy  2007    [facility]  . Cataract extraction w/phaco  05/16/2011    Procedure: CATARACT EXTRACTION PHACO AND INTRAOCULAR LENS PLACEMENT (IOC);  Surgeon: Tonny Branch;  Location: [facility];  Service: Ophthalmology;  Laterality: Right;  . Repair imperforate anus / anorectoplasty      per patient  . Colonoscopy N/A 05/17/2013    Dr. Barnie Alderman diverticulosis was noted/small internal hemorrhoids  . Eye surgery  2010, 1992 approx    bilateral  . Breast surgery Left 2004    left partial mastectomy, [facility]   Allergies  Allergen Reactions  . Acyclovir And Related Rash   Current Outpatient Prescriptions  Medication Sig Dispense Refill  . benazepril (LOTENSIN) 10 MG tablet Take 1 tablet (10 mg total) by mouth daily.    . calcium-vitamin D (OSCAL WITH D) 500-200 MG-UNIT per tablet Take 1 tablet by  mouth 2 (two) times daily.    Marland Kitchen docusate sodium (COLACE) 100 MG capsule Take 100 mg by mouth daily.    Marland Kitchen esomeprazole (NEXIUM) 40 MG capsule TAKE ONE CAPSULE BY MOUTH ONCE DAILY.    . fluticasone (FLONASE) 50 MCG/ACT nasal spray USE 2 SPRAYS IN EACH NOSTRIL ONCE DAILY.    Marland Kitchen gabapentin (NEURONTIN) 300 MG capsule Take 300 mg by mouth at bedtime.    . hydrOXYzine (ATARAX/VISTARIL) 10 MG tablet Take 1 tablet (10 mg total) by mouth 3 TID daily PRN     . hydrOXYzine (VISTARIL) 25 MG capsule Take 1 capsule (25 mg total) by mouth 3 (three) times daily as needed.    . lubiprostone (AMITIZA) 8 MCG capsule Take 1 capsule (8 mcg total) by mouth 2 (two) times daily with a meal.    . montelukast (SINGULAIR) 10 MG tablet Take 1 tablet (10 mg total) by mouth daily.    . Multiple Vitamin (MULTIVITAMIN) capsule Take 1 capsule by mouth daily.    . polyethylene glycol powder (GLYCOLAX/MIRALAX) powder MIX 1 CAPFUL IN 8 OUNCES OF JUICE/WATER AND DRINK ONCE DAILY.    .      . rosuvastatin (CRESTOR) 10 MG tablet TAKE (1) TABLET BY MOUTH AT BEDTIME.    . travoprost, benzalkonium, (TRAVATAN) 0.004 % ophthalmic solution Place 1 drop into both eyes  at bedtime.      . triamterene-hydrochlorothiazide (MAXZIDE-25) 37.5-25 MG per tablet Take 1 tablet by mouth daily.    Marland Kitchen venlafaxine XR (EFFEXOR-XR) 75 MG 24 hr capsule TAKE ONE CAPSULE BY MOUTH DAILY WITH BREAKFAST.      Review of Systems     Objective:   Physical Exam  Constitutional: She is oriented to person, place, and time. She appears well-developed and well-nourished. No distress.  HENT:  Head: Normocephalic and atraumatic.  Mouth/Throat: Oropharynx is clear and moist. No oropharyngeal exudate.  Eyes: Pupils are equal, round, and reactive to light. No scleral icterus.  Neck: Normal range of motion. Neck supple.  Cardiovascular: Normal rate, regular rhythm and normal heart sounds.   Pulmonary/Chest: Effort normal and breath sounds normal. No respiratory distress.   Abdominal: Soft. Bowel sounds are normal. She exhibits no distension. There is no tenderness.  Musculoskeletal: She exhibits no edema.  Lymphadenopathy:    She has no cervical adenopathy.  Neurological: She is alert and oriented to person, place, and time.  Psychiatric: She has a normal mood and affect.  Vitals reviewed.         Assessment & Plan:

## 2015-04-08 NOTE — Assessment & Plan Note (Signed)
SYMPTOMS FAIRLY WELL CONTROLLED.  TRY INCREASING TO 24 MCG BID TO HELP HAVE DAILY BM AND COMPLETE EVACUATION. DRINK WATER EAT FIBER. CALL FOR RX FOR 24 MCG DOSE WHEN NEEDED. FOLLOW UP IN 1 YEAR.

## 2015-04-22 NOTE — Progress Notes (Signed)
cc'd to pcp 

## 2015-04-25 DIAGNOSIS — M170 Bilateral primary osteoarthritis of knee: Secondary | ICD-10-CM | POA: Insufficient documentation

## 2015-04-25 NOTE — Assessment & Plan Note (Signed)
Closel;y supervised by specialist, pt compliant with treatment

## 2015-04-25 NOTE — Assessment & Plan Note (Signed)
Outbreak x 1 week, acyclovir prescribed

## 2015-04-25 NOTE — Assessment & Plan Note (Signed)
Controlled, no change in medication  

## 2015-04-25 NOTE — Assessment & Plan Note (Signed)
Controlled, no change in medication DASH diet and commitment to daily physical activity for a minimum of 30 minutes discussed and encouraged, as a part of hypertension management. The importance of attaining a healthy weight is also discussed.  BP/Weight 04/08/2015 03/26/2015 03/12/2015 02/24/2015 02/19/2015 3/41/9379 0/12/4095  Systolic BP 353 299 242 683 419 622 297  Diastolic BP 73 73 90 74 84 74 81  Wt. (Lbs) 113.6 108 115 114.12 116.6 113 111.6  BMI 22.93 20.42 21.74 20.87 21.32 21.36 21.1

## 2015-04-25 NOTE — Assessment & Plan Note (Signed)
Open sore on lower lip, TdAP administered

## 2015-04-25 NOTE — Assessment & Plan Note (Signed)
Responds to medication per GI continue same

## 2015-04-25 NOTE — Assessment & Plan Note (Signed)
Controlled, no change in medication Hyperlipidemia:Low fat diet discussed and encouraged.   Lipid Panel  Lab Results  Component Value Date   CHOL 168 02/21/2015   HDL 91 02/21/2015   LDLCALC 64 02/21/2015   TRIG 65 02/21/2015   CHOLHDL 1.8 02/21/2015

## 2015-04-25 NOTE — Assessment & Plan Note (Signed)
Advised tylenol as needed, for pain, and encouraged her to keep active

## 2015-04-25 NOTE — Assessment & Plan Note (Signed)
Improved with effexor continue same

## 2015-05-29 ENCOUNTER — Encounter: Payer: Self-pay | Admitting: Obstetrics & Gynecology

## 2015-05-29 ENCOUNTER — Ambulatory Visit (INDEPENDENT_AMBULATORY_CARE_PROVIDER_SITE_OTHER): Payer: Medicare Other | Admitting: Obstetrics & Gynecology

## 2015-05-29 VITALS — BP 110/80 | HR 84 | Ht 60.0 in | Wt 114.0 lb

## 2015-05-29 DIAGNOSIS — N952 Postmenopausal atrophic vaginitis: Secondary | ICD-10-CM | POA: Diagnosis not present

## 2015-05-29 MED ORDER — ESTROGENS, CONJUGATED 0.625 MG/GM VA CREA: TOPICAL_CREAM | VAGINAL | Status: DC

## 2015-05-29 NOTE — Progress Notes (Signed)
Patient ID: Toni Miller, female   DOB: [birth year], 54 y.o.   MRN: [id number] Chief Complaint  Patient presents with  . gyn visit    vaginal spotting.    Blood pressure 110/80, pulse 84, height 5' (1.524 m), weight 114 lb (51.71 kg).  Subjective Patient states she had some vaginal spotting after intercourse Pertinent history she has had an abdominal hysterectomy and removal of both tubes and ovaries many years ago by me  Objective Exam Vitiligo of the labia and perineum and vagina no lesions noted Vagina with atrophic changes and a and abrasion at the vaginal cuff apex no lesions noted Urethra normal no lesions  Labs or studies None  Impression  Atrophic vaginal changes with postcoital spotting  Plan/Recommendations  Vaginal Premarin cream 3 times a week  Follow-up as needed

## 2015-06-04 DIAGNOSIS — Z961 Presence of intraocular lens: Secondary | ICD-10-CM | POA: Diagnosis not present

## 2015-06-04 DIAGNOSIS — H26491 Other secondary cataract, right eye: Secondary | ICD-10-CM | POA: Diagnosis not present

## 2015-06-04 HISTORY — PX: CATARACT EXTRACTION: SUR2

## 2015-06-26 ENCOUNTER — Other Ambulatory Visit: Payer: Self-pay

## 2015-06-26 MED ORDER — MONTELUKAST SODIUM 10 MG PO TABS: 10.0000 mg | ORAL_TABLET | Freq: Every day | ORAL | Status: DC

## 2015-07-28 ENCOUNTER — Ambulatory Visit (INDEPENDENT_AMBULATORY_CARE_PROVIDER_SITE_OTHER): Payer: Medicare Other | Admitting: Family Medicine

## 2015-07-28 ENCOUNTER — Encounter: Payer: Self-pay | Admitting: Family Medicine

## 2015-07-28 VITALS — BP 130/82 | HR 74 | Resp 16 | Ht 60.0 in | Wt 113.8 lb

## 2015-07-28 DIAGNOSIS — I10 Essential (primary) hypertension: Secondary | ICD-10-CM

## 2015-07-28 DIAGNOSIS — Z1159 Encounter for screening for other viral diseases: Secondary | ICD-10-CM

## 2015-07-28 DIAGNOSIS — Z0000 Encounter for general adult medical examination without abnormal findings: Secondary | ICD-10-CM

## 2015-07-28 DIAGNOSIS — R21 Rash and other nonspecific skin eruption: Secondary | ICD-10-CM

## 2015-07-28 DIAGNOSIS — Z23 Encounter for immunization: Secondary | ICD-10-CM

## 2015-07-28 DIAGNOSIS — E785 Hyperlipidemia, unspecified: Secondary | ICD-10-CM

## 2015-07-28 DIAGNOSIS — L282 Other prurigo: Secondary | ICD-10-CM

## 2015-07-28 NOTE — Assessment & Plan Note (Addendum)
4 month h/o intermittent pruritic rash affecting upper and lower extremities, derm to eval and treat

## 2015-07-28 NOTE — Progress Notes (Signed)
Subjective:    Patient ID: Toni Miller, female    DOB: Apr 02, 1961, 54 y.o.   MRN: [id number]  HPI Preventive Screening-Counseling & Management   Patient present here today for a Medicare annual wellness visit. C/o also recurrent rash which itches on arms and legs for past 4 months  Current Problems (verified)   Medications Prior to Visit Allergies (verified)   PAST HISTORY  Family History (verified)    Social History  Currently seperated with 3 children, became disabled in 2010. Never smoker or use alcohol     Risk Factors  Current exercise habits:  Walks a few days a week on the walking trail   Dietary issues discussed: heart healthy, low fat   Cardiac risk factors: Sister had heart attack (half sister) at 70  Depression Screen  (Note: if answer to either of the following is "Yes", a more complete depression screening is indicated)   Over the past two weeks, have you felt down, depressed or hopeless? No  Over the past two weeks, have you felt little interest or pleasure in doing things? No  Have you lost interest or pleasure in daily life? No  Do you often feel hopeless? No  Do you cry easily over simple problems? No   Activities of Daily Living  In your present state of health, do you have any difficulty performing the following activities?  Driving?: doesn't drive  Managing money?: No Feeding yourself?:No Getting from bed to chair?:No Climbing a flight of stairs?:No Preparing food and eating?:No Bathing or showering?:No Getting dressed?:No Getting to the toilet?:No Using the toilet?:No Moving around from place to place?: No  Fall Risk Assessment In the past year have you fallen or had a near fall?:No Are you currently taking any medications that make you dizzy?:No   Hearing Difficulties: No Do you often ask people to speak up or repeat themselves?:No Do you experience ringing or noises in your ears?:No Do you have difficulty understanding soft or  whispered voices?:No  Cognitive Testing  Alert? Yes Normal Appearance?Yes  Oriented to person? Yes Place? Yes  Time? Yes  Displays appropriate judgment?Yes  Can read the correct time from a watch face? yes Are you having problems remembering things? Sometimes   Advanced Directives have been discussed with the patient?Yes, brochure given  , full code   List the Names of Other Physician/Practitioners you currently use:  Dr Oneida Alar (GI ) Dr Elonda Husky (gyn)  Dr Jorja Loa (opth)  Indicate any recent Medical Services you may have received from other than [facility] providers in the past year (date may be approximate).   Assessment:    Annual Wellness Exam   Plan:    Medicare Attestation  I have personally reviewed:  The patient's medical and social history  Their use of alcohol, tobacco or illicit drugs  Their current medications and supplements  The patient's functional ability including ADLs,fall risks, home safety risks, cognitive, and hearing and visual impairment  Diet and physical activities  Evidence for depression or mood disorders  The patient's weight, height, BMI, and visual acuity have been recorded in the chart. I have made referrals, counseling, and provided education to the patient based on review of the above and I have provided the patient with a written personalized care plan for preventive services.      Review of Systems     Objective:   Physical Exam BP 130/82 mmHg  Pulse 74  Resp 16  Ht 5' (1.524 m)  Wt 113 lb  12.8 oz (51.619 kg)  BMI 22.22 kg/m2  SpO2 98%  Skin; hyper and hypopigmented macular rash on upper and lower extremities in patches       Assessment & Plan:  Pruritic rash 4 month h/o intermittent pruritic rash affecting upper and lower extremities, derm to eval and treat  Medicare annual wellness visit, subsequent Annual exam as documented. Counseling done  re healthy lifestyle involving commitment to 150 minutes exercise per week, heart healthy  diet, and attaining healthy weight.The importance of adequate sleep also discussed. Regular seat belt use and home safety, is also discussed. Changes in health habits are decided on by the patient with goals and time frames  set for achieving them. Immunization and cancer screening needs are specifically addressed at this visit.   Need for prophylactic vaccination and inoculation against influenza After obtaining informed consent, the vaccine is  administered by LPN.

## 2015-07-28 NOTE — Patient Instructions (Signed)
Annual physical exam in January, call if you need me before  Flu vaccine today   CBC, hep C screen, fasting lipid, cmp  First week in December    No change in medciation

## 2015-08-01 ENCOUNTER — Encounter: Payer: Self-pay | Admitting: Family Medicine

## 2015-08-01 DIAGNOSIS — Z0000 Encounter for general adult medical examination without abnormal findings: Secondary | ICD-10-CM | POA: Insufficient documentation

## 2015-08-01 DIAGNOSIS — Z23 Encounter for immunization: Secondary | ICD-10-CM | POA: Insufficient documentation

## 2015-08-01 NOTE — Assessment & Plan Note (Signed)
After obtaining informed consent, the vaccine is  administered by LPN.  

## 2015-08-01 NOTE — Assessment & Plan Note (Signed)

## 2015-08-04 DIAGNOSIS — L309 Dermatitis, unspecified: Secondary | ICD-10-CM | POA: Diagnosis not present

## 2015-08-04 DIAGNOSIS — L738 Other specified follicular disorders: Secondary | ICD-10-CM | POA: Diagnosis not present

## 2015-08-18 ENCOUNTER — Other Ambulatory Visit: Payer: Self-pay | Admitting: Family Medicine

## 2015-08-18 DIAGNOSIS — Z1231 Encounter for screening mammogram for malignant neoplasm of breast: Secondary | ICD-10-CM

## 2015-08-25 ENCOUNTER — Other Ambulatory Visit: Payer: Self-pay | Admitting: Family Medicine

## 2015-08-27 ENCOUNTER — Ambulatory Visit (HOSPITAL_COMMUNITY)
Admission: RE | Admit: 2015-08-27 | Discharge: 2015-08-27 | Disposition: A | Discharge status: Home / Self Care | Payer: Medicare Other | Source: Ambulatory Visit | Attending: Family Medicine | Admitting: Family Medicine

## 2015-08-27 DIAGNOSIS — Z1231 Encounter for screening mammogram for malignant neoplasm of breast: Secondary | ICD-10-CM | POA: Diagnosis not present

## 2015-09-19 ENCOUNTER — Other Ambulatory Visit: Payer: Self-pay | Admitting: Family Medicine

## 2015-10-05 ENCOUNTER — Other Ambulatory Visit: Payer: Self-pay | Admitting: Family Medicine

## 2015-10-08 DIAGNOSIS — Z1159 Encounter for screening for other viral diseases: Secondary | ICD-10-CM | POA: Diagnosis not present

## 2015-10-08 DIAGNOSIS — E785 Hyperlipidemia, unspecified: Secondary | ICD-10-CM | POA: Diagnosis not present

## 2015-10-08 DIAGNOSIS — I10 Essential (primary) hypertension: Secondary | ICD-10-CM | POA: Diagnosis not present

## 2015-10-08 LAB — CBC WITH DIFFERENTIAL/PLATELET
BASOS ABS: 0 10*3/uL (ref 0.0–0.1)
Basophils Relative: 0 % (ref 0–1)
Eosinophils Absolute: 0.1 10*3/uL (ref 0.0–0.7)
Eosinophils Relative: 2 % (ref 0–5)
HEMATOCRIT: 39.6 % (ref 36.0–46.0)
HEMOGLOBIN: 13.9 g/dL (ref 12.0–15.0)
LYMPHS ABS: 2.2 10*3/uL (ref 0.7–4.0)
LYMPHS PCT: 37 % (ref 12–46)
MCH: 30.8 pg (ref 26.0–34.0)
MCHC: 35.1 g/dL (ref 30.0–36.0)
MCV: 87.8 fL (ref 78.0–100.0)
MPV: 8.8 fL (ref 8.6–12.4)
Monocytes Absolute: 0.5 10*3/uL (ref 0.1–1.0)
Monocytes Relative: 9 % (ref 3–12)
Neutro Abs: 3.1 10*3/uL (ref 1.7–7.7)
Neutrophils Relative %: 52 % (ref 43–77)
Platelets: 234 10*3/uL (ref 150–400)
RBC: 4.51 MIL/uL (ref 3.87–5.11)
RDW: 13.9 % (ref 11.5–15.5)
WBC: 6 10*3/uL (ref 4.0–10.5)

## 2015-10-09 LAB — LIPID PANEL
CHOL/HDL RATIO: 2.2 ratio (ref <=5.0)
Cholesterol: 196 mg/dL (ref 125–200)
HDL: 88 mg/dL (ref >=46)
LDL Cholesterol: 98 mg/dL (ref <130)
Triglycerides: 52 mg/dL (ref <150)
VLDL: 10 mg/dL (ref <30)

## 2015-10-09 LAB — COMPREHENSIVE METABOLIC PANEL
ALBUMIN: 4.5 g/dL (ref 3.6–5.1)
ALT: 14 U/L (ref 6–29)
AST: 21 U/L (ref 10–35)
Alkaline Phosphatase: 55 U/L (ref 33–130)
BUN: 20 mg/dL (ref 7–25)
CHLORIDE: 99 mmol/L (ref 98–110)
CO2: 31 mmol/L (ref 20–31)
Calcium: 9.6 mg/dL (ref 8.6–10.4)
Creat: 0.79 mg/dL (ref 0.50–1.05)
Glucose, Bld: 80 mg/dL (ref 65–99)
Potassium: 4.6 mmol/L (ref 3.5–5.3)
Sodium: 137 mmol/L (ref 135–146)
Total Bilirubin: 0.4 mg/dL (ref 0.2–1.2)
Total Protein: 7.3 g/dL (ref 6.1–8.1)

## 2015-10-09 LAB — HEPATITIS C ANTIBODY: HCV Ab: NEGATIVE

## 2015-10-21 ENCOUNTER — Ambulatory Visit (INDEPENDENT_AMBULATORY_CARE_PROVIDER_SITE_OTHER): Payer: Medicare Other | Admitting: Family Medicine

## 2015-10-21 ENCOUNTER — Encounter: Payer: Self-pay | Admitting: Family Medicine

## 2015-10-21 VITALS — BP 100/60 | HR 76 | Temp 98.8°F | Resp 18 | Ht 60.0 in | Wt 114.1 lb

## 2015-10-21 DIAGNOSIS — R05 Cough: Secondary | ICD-10-CM

## 2015-10-21 DIAGNOSIS — I10 Essential (primary) hypertension: Secondary | ICD-10-CM | POA: Diagnosis not present

## 2015-10-21 DIAGNOSIS — E785 Hyperlipidemia, unspecified: Secondary | ICD-10-CM | POA: Diagnosis not present

## 2015-10-21 DIAGNOSIS — R058 Other specified cough: Secondary | ICD-10-CM | POA: Insufficient documentation

## 2015-10-21 DIAGNOSIS — F32A Depression, unspecified: Secondary | ICD-10-CM

## 2015-10-21 DIAGNOSIS — F329 Major depressive disorder, single episode, unspecified: Secondary | ICD-10-CM

## 2015-10-21 MED ORDER — METHYLPREDNISOLONE ACETATE 80 MG/ML IJ SUSP
80.0000 mg | Freq: Once | INTRAMUSCULAR | Status: AC
  Administered 2015-10-21: 80 mg via INTRAMUSCULAR

## 2015-10-21 MED ORDER — PREDNISONE 5 MG PO TABS: 5.0000 mg | ORAL_TABLET | Freq: Two times a day (BID) | ORAL | Status: AC

## 2015-10-21 MED ORDER — PROMETHAZINE-DM 6.25-15 MG/5ML PO SYRP
ORAL_SOLUTION | ORAL | Status: DC
Start: 2015-10-21 — End: 2015-11-30

## 2015-10-21 MED ORDER — MOMETASONE FUROATE 50 MCG/ACT NA SUSP: 2.0000 | Freq: Every day | NASAL | Status: DC

## 2015-10-21 MED ORDER — PHENYLEPHRINE-CHLORPHEN-DM 3.5-1-3 MG/ML PO LIQD: 1.0000 mL | Freq: Four times a day (QID) | ORAL | Status: DC | PRN

## 2015-10-21 NOTE — Patient Instructions (Addendum)
F/u as before  You are treated for uncontrolled allergies causing  Cough  Depo medrol in office  Use nasonex and singulair every day  Cough syrup at night as needed   Chorpheniramine one daily for next 3 to 5 days will reduce drainage and cough , this is OTC

## 2015-10-21 NOTE — Progress Notes (Signed)
   Subjective:    Patient ID: Toni Miller, female    DOB: [birth year], 54 y.o.   MRN: [id number]  HPI 4 day h/o excessive clear nasal drainage , cough at night, no fever , chills or sputum Denies sinus pressure, c/o post nasal drainage worse at night when lying down   Review of Systems See HPI  Denies chest pains, palpitations and leg swelling Denies abdominal pain, nausea, vomiting,diarrhea or constipation.   Denies dysuria, frequency, hesitancy or incontinence. Denies joint pain, swelling and limitation in mobility. Denies headaches, seizures, numbness, or tingling. Denies depression, anxiety or insomnia. Denies skin break down or rash.        Objective:   Physical Exam  BP 100/60 mmHg  Pulse 76  Temp(Src) 98.8 F (37.1 C)  Resp 18  Ht 5' (1.524 m)  Wt 114 lb 1.3 oz (51.746 kg)  BMI 22.28 kg/m2  SpO2 98% Patient alert and oriented and in no cardiopulmonary distress.  HEENT: No facial asymmetry, EOMI,   oropharynx pink and moist.  Neck supple no JVD, no mass. Nasal mucosa edematous and erythematous, clear nasal drainage Chest: Clear to auscultation bilaterally.  CVS: S1, S2 no murmurs, no S3.Regular rate.  ABD: Soft non tender.   Ext: No edema  MS: Adequate ROM spine, shoulders, hips and knees.  Skin: Intact, no ulcerations or rash noted.  Psych: Good eye contact, normal affect. Memory intact not anxious or depressed appearing.  CNS: CN 2-12 intact, power,  normal throughout.no focal deficits noted.       Assessment & Plan:  Allergic rhinitis Uncontrolled, commitment to daily med is stressed and depo medrol  Administered in office. Pt to use OTC decongestant for several a days also  Essential hypertension Controlled, no change in medication   Hyperlipidemia with target LDL less than 100 Hyperlipidemia:Low fat diet discussed and encouraged.   Lipid Panel  Lab Results  Component Value Date   CHOL 196 10/08/2015   HDL 88 10/08/2015   LDLCALC  98 10/08/2015   TRIG 52 10/08/2015   CHOLHDL 2.2 10/08/2015   Controlled, no change in medication      Depression Controlled, no change in medication

## 2015-11-21 NOTE — Assessment & Plan Note (Signed)
Uncontrolled, commitment to daily med is stressed and depo medrol  Administered in office. Pt to use OTC decongestant for several a days also

## 2015-11-21 NOTE — Assessment & Plan Note (Signed)
Controlled, no change in medication  

## 2015-11-21 NOTE — Assessment & Plan Note (Signed)
Hyperlipidemia:Low fat diet discussed and encouraged.   Lipid Panel  Lab Results  Component Value Date   CHOL 196 10/08/2015   HDL 88 10/08/2015   LDLCALC 98 10/08/2015   TRIG 52 10/08/2015   CHOLHDL 2.2 10/08/2015   Controlled, no change in medication

## 2015-11-27 ENCOUNTER — Other Ambulatory Visit: Payer: Self-pay | Admitting: Family Medicine

## 2015-11-30 ENCOUNTER — Ambulatory Visit (INDEPENDENT_AMBULATORY_CARE_PROVIDER_SITE_OTHER): Payer: Medicare Other | Admitting: Family Medicine

## 2015-11-30 ENCOUNTER — Encounter: Payer: Self-pay | Admitting: Family Medicine

## 2015-11-30 VITALS — BP 108/66 | HR 97 | Resp 18 | Ht 60.0 in | Wt 112.1 lb

## 2015-11-30 DIAGNOSIS — Z0000 Encounter for general adult medical examination without abnormal findings: Secondary | ICD-10-CM | POA: Diagnosis not present

## 2015-11-30 DIAGNOSIS — Z1211 Encounter for screening for malignant neoplasm of colon: Secondary | ICD-10-CM | POA: Diagnosis not present

## 2015-11-30 DIAGNOSIS — I10 Essential (primary) hypertension: Secondary | ICD-10-CM

## 2015-11-30 DIAGNOSIS — R05 Cough: Secondary | ICD-10-CM

## 2015-11-30 DIAGNOSIS — R058 Other specified cough: Secondary | ICD-10-CM

## 2015-11-30 DIAGNOSIS — E785 Hyperlipidemia, unspecified: Secondary | ICD-10-CM

## 2015-11-30 LAB — POC HEMOCCULT BLD/STL (OFFICE/1-CARD/DIAGNOSTIC): Card #1 Date: NEGATIVE

## 2015-11-30 NOTE — Patient Instructions (Addendum)
F/u in 4.5 month, call if you8 need me sooner  Nurse BP check in 6 weeks  STOP BENAZEPRIL  Fasting labs in 4. 5 months, 1 week before follow up  Thanks for choosing [facility], we consider it a privelige to serve you.   All the best for 2017!

## 2015-11-30 NOTE — Progress Notes (Signed)
   Subjective:    Patient ID: Toni Miller, female    DOB: [birth year], 55 y.o.   MRN: [id number]  HPI Patient is in for annual physical exam. No other health concerns are expressed or addressed at the visit. Recent labs, if available are reviewed. Immunization is reviewed , and  updated if needed.    Review of Systems See HPI     Objective:   Physical Exam BP 108/66 mmHg  Pulse 97  Resp 18  Ht 5' (1.524 m)  Wt 112 lb 1.9 oz (50.857 kg)  BMI 21.90 kg/m2  SpO2 100%   Pleasant well nourished female, alert and oriented x 3, in no cardio-pulmonary distress. Afebrile. HEENT No facial trauma or asymetry. Sinuses non tender.  Extra occullar muscles intact, pupils equally reactive to light. External ears normal, tympanic membranes clear. Oropharynx moist, no exudate,  Fairly good dentition. Neck: supple, no adenopathy,JVD or thyromegaly.No bruits.  Chest: Clear to ascultation bilaterally.No crackles or wheezes. Non tender to palpation  Breast: No asymetry,no masses or lumps. No tenderness. No nipple discharge or inversion. No axillary or supraclavicular adenopathy  Cardiovascular system; Heart sounds normal,  S1 and  S2 ,no S3.  No murmur, or thrill. Apical beat not displaced Peripheral pulses normal.  Abdomen: Soft, non tender, no organomegaly or masses. No bruits. Bowel sounds normal. No guarding, tenderness or rebound.  Rectal:  Normal sphincter tone. No mass.No rectal masses.  Guaiac negative stool.  GU: External genitalia normal female genitalia , female distribution of hair. No lesions. Urethral meatus normal in size, no  Prolapse, no lesions visibly  Present. Bladder non tender. Vagina pink and moist , with no visible lesions , discharge present . Adequate pelvic support no  cystocele or rectocele noted Cervix pink and appears healthy, no lesions or ulcerations noted, no discharge noted from os Uterus absent, no adnexal masses, no  adnexal  tenderness.   Musculoskeletal exam: Full ROM of spine, hips , shoulders and knees. No deformity ,swelling or crepitus noted. No muscle wasting or atrophy.   Neurologic: Cranial nerves 2 to 12 intact. Power, tone ,sensation and reflexes normal throughout. No disturbance in gait. No tremor.  Skin: Intact, no ulceration or , erythema.  genital vitiligo, and areas of mild de pignmentation noted on extremities  Psych; Normal mood and affect. Judgement and concentration normal        Assessment & Plan:  Annual physical exam Annual exam as documented. Counseling done  re healthy lifestyle involving commitment to 150 minutes exercise per week, heart healthy diet, and attaining healthy weight.The importance of adequate sleep also discussed. Regular seat belt use and home safety, is also discussed. Changes in health habits are decided on by the patient with goals and time frames  set for achieving them. Immunization and cancer screening needs are specifically addressed at this visit.   Allergic cough Needs to discontinue benazepril, BP is low normal currently also Nurse to re eval bP in 4 weeks

## 2015-12-04 DIAGNOSIS — Z0000 Encounter for general adult medical examination without abnormal findings: Secondary | ICD-10-CM | POA: Insufficient documentation

## 2015-12-04 NOTE — Assessment & Plan Note (Signed)
Needs to discontinue benazepril, BP is low normal currently also Nurse to re eval bP in 4 weeks

## 2015-12-04 NOTE — Assessment & Plan Note (Signed)

## 2015-12-08 ENCOUNTER — Other Ambulatory Visit: Payer: Self-pay | Admitting: Family Medicine

## 2015-12-23 ENCOUNTER — Telehealth: Payer: Self-pay | Admitting: Family Medicine

## 2015-12-23 NOTE — Telephone Encounter (Signed)
Patient is stating that her toes is cramping real bad and she is asking what can she do, please advise?

## 2015-12-29 ENCOUNTER — Other Ambulatory Visit: Payer: Self-pay | Admitting: Family Medicine

## 2015-12-29 MED ORDER — POTASSIUM CHLORIDE ER 10 MEQ PO TBCR: 10.0000 meq | EXTENDED_RELEASE_TABLET | Freq: Every day | ORAL | Status: DC | PRN

## 2015-12-29 NOTE — Telephone Encounter (Signed)
Patient aware.  Medication sent to pharmacy.  Also mailed copy of potassium rich foods and patient education on muscle cramps.

## 2015-12-29 NOTE — Addendum Note (Signed)
Addended by: Denman George B on: 12/29/2015 10:56 AM   Modules accepted: Orders

## 2015-12-29 NOTE — Telephone Encounter (Signed)
Advise and send in potassium 10 meq one daily #30 no refill pls

## 2016-02-15 ENCOUNTER — Other Ambulatory Visit: Payer: Self-pay

## 2016-02-15 MED ORDER — ESOMEPRAZOLE MAGNESIUM 40 MG PO CPDR: DELAYED_RELEASE_CAPSULE | ORAL | Status: DC

## 2016-02-15 MED ORDER — MONTELUKAST SODIUM 10 MG PO TABS: 10.0000 mg | ORAL_TABLET | Freq: Every day | ORAL | Status: DC

## 2016-02-18 DIAGNOSIS — H52223 Regular astigmatism, bilateral: Secondary | ICD-10-CM | POA: Diagnosis not present

## 2016-02-18 DIAGNOSIS — H524 Presbyopia: Secondary | ICD-10-CM | POA: Diagnosis not present

## 2016-02-18 DIAGNOSIS — H5202 Hypermetropia, left eye: Secondary | ICD-10-CM | POA: Diagnosis not present

## 2016-02-18 DIAGNOSIS — H401112 Primary open-angle glaucoma, right eye, moderate stage: Secondary | ICD-10-CM | POA: Diagnosis not present

## 2016-02-29 ENCOUNTER — Telehealth: Payer: Self-pay

## 2016-02-29 ENCOUNTER — Encounter (HOSPITAL_COMMUNITY): Payer: Self-pay

## 2016-02-29 ENCOUNTER — Emergency Department (HOSPITAL_COMMUNITY)
Admission: EM | Admit: 2016-02-29 | Discharge: 2016-02-29 | Disposition: A | Discharge status: Home / Self Care | Payer: Medicare Other | Attending: Emergency Medicine | Admitting: Emergency Medicine

## 2016-02-29 DIAGNOSIS — M542 Cervicalgia: Secondary | ICD-10-CM | POA: Insufficient documentation

## 2016-02-29 DIAGNOSIS — Z79899 Other long term (current) drug therapy: Secondary | ICD-10-CM | POA: Insufficient documentation

## 2016-02-29 DIAGNOSIS — I10 Essential (primary) hypertension: Secondary | ICD-10-CM | POA: Diagnosis not present

## 2016-02-29 DIAGNOSIS — R519 Headache, unspecified: Secondary | ICD-10-CM

## 2016-02-29 DIAGNOSIS — E785 Hyperlipidemia, unspecified: Secondary | ICD-10-CM | POA: Insufficient documentation

## 2016-02-29 DIAGNOSIS — Z87891 Personal history of nicotine dependence: Secondary | ICD-10-CM | POA: Insufficient documentation

## 2016-02-29 DIAGNOSIS — R51 Headache: Secondary | ICD-10-CM | POA: Diagnosis not present

## 2016-02-29 MED ORDER — HYDROMORPHONE HCL 1 MG/ML IJ SOLN
1.0000 mg | Freq: Once | INTRAMUSCULAR | Status: AC
  Administered 2016-02-29: 1 mg via INTRAMUSCULAR
  Filled 2016-02-29: qty 1

## 2016-02-29 MED ORDER — HYDROCODONE-ACETAMINOPHEN 5-325 MG PO TABS: 1.0000 | ORAL_TABLET | Freq: Four times a day (QID) | ORAL | Status: DC | PRN

## 2016-02-29 MED ORDER — CYCLOBENZAPRINE HCL 10 MG PO TABS: 10.0000 mg | ORAL_TABLET | Freq: Three times a day (TID) | ORAL | Status: DC | PRN

## 2016-02-29 NOTE — ED Provider Notes (Signed)
CSN: [id number]     Arrival date & time 02/29/16  1331 History   First MD Initiated Contact with Patient 02/29/16 1449     Chief Complaint  Patient presents with  . Headache     (Consider location/radiation/quality/duration/timing/severity/associated sxs/prior Treatment) Patient is a 55 y.o. female presenting with headaches. The history is provided by the patient (Patient complains of neck pain and headache. She states she's had muscles and neck have been tense).  Headache Pain location:  Occipital Quality:  Dull Radiates to:  Does not radiate Severity currently:  7/10 Severity at highest:  8/10 Onset quality:  Sudden Timing:  Constant Progression:  Waxing and waning Chronicity:  Recurrent Context: not activity   Associated symptoms: no abdominal pain, no back pain, no congestion, no cough, no diarrhea, no fatigue, no seizures and no sinus pressure     Past Medical History  Diagnosis Date  . GERD (gastroesophageal reflux disease)   . Hyperlipidemia   . Neck pain 7/09    WITH BULGING DISC-----RECIEVING EPIDURALS   . Sinusitis   . Constipation   . Cancer ([place]) 1993    abnormal pap treated at [facility]  . Hypertension   . Glaucoma 1993   Past Surgical History  Procedure Laterality Date  . Tubal ligation  1991  . Bilateral eye surgery      for glaucoma  . Total abdominal hysterectomy w/ bilateral salpingoophorectomy  2007    [facility], [facility]  . Cyst removed left breast /benign  2005    left, [facility]  . Abdominal exploration surgery  age 89    bowel obstruction, [facility]  . Abdominal hysterectomy  2007    [facility], [facility]  . Cataract extraction w/phaco  05/16/2011    Procedure: CATARACT EXTRACTION PHACO AND INTRAOCULAR LENS PLACEMENT (IOC);  Surgeon: Tonny Branch;  Location: [facility];  Service: Ophthalmology;  Laterality: Right;  . Repair imperforate anus / anorectoplasty      per patient  . Colonoscopy N/A 05/17/2013    Dr. Barnie Alderman diverticulosis was noted/small internal hemorrhoids  . Eye  surgery  2010, 1992 approx    bilateral  . Breast surgery Left 2004    left partial mastectomy, [facility]  . Cataract extraction Right 06/04/2015   Family History  Problem Relation Age of Onset  . Adopted: Yes  . Alcohol abuse Mother   . Cancer Mother 30    stomach  . Lung cancer Father   . Glaucoma Father   . Cancer Father   . Alcohol abuse Father   . Breast cancer Sister   . Cancer Sister 11  . Cancer Sister 38    breast   . Heart disease Sister   . Colon cancer Neg Hx    Social History  Substance Use Topics  . Smoking status: Former Smoker    Quit date: 05/12/1991  . Smokeless tobacco: Never Used  . Alcohol Use: Yes     Comment: drinks a beer 1 x month   OB History    No data available     Review of Systems  Constitutional: Negative for appetite change and fatigue.  HENT: Negative for congestion, ear discharge and sinus pressure.   Eyes: Negative for discharge.  Respiratory: Negative for cough.   Cardiovascular: Negative for chest pain.  Gastrointestinal: Negative for abdominal pain and diarrhea.  Genitourinary: Negative for frequency and hematuria.  Musculoskeletal: Negative for back pain.  Skin: Negative for rash.  Neurological: Positive for headaches. Negative for seizures.  Psychiatric/Behavioral: Negative for  hallucinations.      Allergies  Acyclovir and related  Home Medications   Prior to Admission medications   Medication Sig Start Date End Date Taking? Authorizing Provider  calcium-vitamin D (OSCAL WITH D) 500-200 MG-UNIT per tablet Take 1 tablet by mouth 2 (two) times daily. 04/02/14  Yes [name], MD  docusate sodium (COLACE) 100 MG capsule Take 100 mg by mouth daily.   Yes Historical Provider, MD  esomeprazole (NEXIUM) 40 MG capsule TAKE ONE CAPSULE BY MOUTH ONCE DAILY. 02/15/16  Yes [name], MD  fluticasone (FLONASE) 50 MCG/ACT nasal spray USE 2 SPRAYS IN EACH NOSTRIL ONCE DAILY. 02/24/15  Yes [name], MD  gabapentin  (NEURONTIN) 300 MG capsule TAKE 1 CAPSULE BY MOUTH AT BEDTIME. 08/25/15  Yes [name], MD  hydrOXYzine (VISTARIL) 25 MG capsule TAKE 1 CAPSULE BY MOUTH THREE TIMES DAILY AS NEEDED. Patient taking differently: TAKE 1 CAPSULE BY MOUTH THREE TIMES DAILY AS NEEDED ANXIETY 10/05/15  Yes [name], MD  lubiprostone (AMITIZA) 8 MCG capsule Take 1 capsule (8 mcg total) by mouth 2 (two) times daily with a meal. 12/26/14  Yes Mahala Menghini, PA-C  mometasone (NASONEX) 50 MCG/ACT nasal spray Place 2 sprays into the nose daily. 10/21/15  Yes [name], MD  montelukast (SINGULAIR) 10 MG tablet Take 1 tablet (10 mg total) by mouth daily. 02/15/16  Yes [name], MD  Multiple Vitamin (MULTIVITAMIN) capsule Take 1 capsule by mouth daily.   Yes Historical Provider, MD  polyethylene glycol powder (GLYCOLAX/MIRALAX) powder MIX 1 CAPFUL (17G) IN 8 OUNCES OF JUICE/WATER AND DRINK ONCE DAILY. 10/14/14  Yes [name], MD  rosuvastatin (CRESTOR) 10 MG tablet TAKE (1) TABLET BY MOUTH AT BEDTIME. 12/29/15  Yes [name], MD  TRAVATAN Z 0.004 % SOLN ophthalmic solution Place 1 drop into both eyes at bedtime. 02/16/16  Yes Historical Provider, MD  triamterene-hydrochlorothiazide (MAXZIDE-25) 37.5-25 MG tablet TAKE 1 TABLET BY MOUTH ONCE A DAY. 12/09/15  Yes [name], MD  venlafaxine XR (EFFEXOR-XR) 75 MG 24 hr capsule TAKE ONE CAPSULE BY MOUTH DAILY WITH BREAKFAST. 11/30/15  Yes [name], MD  conjugated estrogens (PREMARIN) vaginal cream Use 1 applicator Monday Wednesday Friday of each week 05/29/15   Florian Buff, MD  cyclobenzaprine (FLEXERIL) 10 MG tablet Take 1 tablet (10 mg total) by mouth 3 (three) times daily as needed for muscle spasms. 02/29/16   Milton Ferguson, MD  HYDROcodone-acetaminophen (NORCO/VICODIN) 5-325 MG tablet Take 1 tablet by mouth every 6 (six) hours as needed for moderate pain. 02/29/16   Milton Ferguson, MD  potassium chloride (K-DUR) 10 MEQ  tablet Take 1 tablet (10 mEq total) by mouth daily as needed. Patient taking differently: Take 10 mEq by mouth daily as needed (low potassium).  12/29/15   [name], MD   BP 122/90 mmHg  Pulse 86  Temp(Src) 99.1 F (37.3 C) (Oral)  Resp 16  Ht 5\' 2"  (1.575 m)  Wt 117 lb (53.071 kg)  BMI 21.39 kg/m2  SpO2 96% Physical Exam  Constitutional: She is oriented to person, place, and time. She appears well-developed.  HENT:  Head: Normocephalic.  Tender posterior neck. Patient is supple  Eyes: Conjunctivae and EOM are normal. No scleral icterus.  Neck: Neck supple. No thyromegaly present.  Cardiovascular: Normal rate and regular rhythm.  Exam reveals no gallop and no friction rub.   No murmur heard. Pulmonary/Chest: No stridor. She  has no wheezes. She has no rales. She exhibits no tenderness.  Abdominal: She exhibits no distension. There is no tenderness. There is no rebound.  Musculoskeletal: Normal range of motion. She exhibits no edema.  Lymphadenopathy:    She has no cervical adenopathy.  Neurological: She is oriented to person, place, and time. She exhibits normal muscle tone. Coordination normal.  Skin: No rash noted. No erythema.  Psychiatric: She has a normal mood and affect. Her behavior is normal.    ED Course  Procedures (including critical care time) Labs Review Labs Reviewed - No data to display  Imaging Review No results found. I have personally reviewed and evaluated these images and lab results as part of my medical decision-making.   EKG Interpretation None      MDM   Final diagnoses:  Headache disorder    Headache and neck muscles inflamed. Patient improved with treatment. Patient put on muscle relaxer and pain medicine will follow-up with PCP    Milton Ferguson, MD 02/29/16 [id number]

## 2016-02-29 NOTE — Discharge Instructions (Signed)
Follow up with your md if not improving. °

## 2016-02-29 NOTE — Telephone Encounter (Signed)
Patient is in route to ED and wanted to notify PCP.

## 2016-02-29 NOTE — ED Notes (Signed)
Patient states posterior headache X2 days. Patient states "ive been diagnosed with muscle spasms in my neck, and this is what it feels like"  Patient denies dizziness, loss of vision or vomiting. Patient ambulatory with a steady gait.

## 2016-03-23 ENCOUNTER — Other Ambulatory Visit: Payer: Self-pay

## 2016-03-23 MED ORDER — VENLAFAXINE HCL ER 75 MG PO CP24: ORAL_CAPSULE | ORAL | Status: DC

## 2016-03-31 DIAGNOSIS — Z961 Presence of intraocular lens: Secondary | ICD-10-CM | POA: Diagnosis not present

## 2016-03-31 DIAGNOSIS — H26492 Other secondary cataract, left eye: Secondary | ICD-10-CM | POA: Diagnosis not present

## 2016-04-07 DIAGNOSIS — E785 Hyperlipidemia, unspecified: Secondary | ICD-10-CM | POA: Diagnosis not present

## 2016-04-07 DIAGNOSIS — E559 Vitamin D deficiency, unspecified: Secondary | ICD-10-CM | POA: Diagnosis not present

## 2016-04-07 DIAGNOSIS — I10 Essential (primary) hypertension: Secondary | ICD-10-CM | POA: Diagnosis not present

## 2016-04-08 LAB — LIPID PANEL
CHOL/HDL RATIO: 1.8 ratio (ref <=5.0)
CHOLESTEROL: 176 mg/dL (ref 125–200)
HDL: 97 mg/dL (ref >=46)
LDL Cholesterol: 68 mg/dL (ref <130)
TRIGLYCERIDES: 53 mg/dL (ref <150)
VLDL: 11 mg/dL (ref <30)

## 2016-04-08 LAB — VITAMIN D 25 HYDROXY (VIT D DEFICIENCY, FRACTURES): Vit D, 25-Hydroxy: 39 ng/mL (ref 30–100)

## 2016-04-08 LAB — COMPLETE METABOLIC PANEL WITH GFR
ALBUMIN: 4.3 g/dL (ref 3.6–5.1)
ALK PHOS: 56 U/L (ref 33–130)
ALT: 19 U/L (ref 6–29)
AST: 24 U/L (ref 10–35)
BILIRUBIN TOTAL: 0.5 mg/dL (ref 0.2–1.2)
BUN: 11 mg/dL (ref 7–25)
CALCIUM: 9.1 mg/dL (ref 8.6–10.4)
CO2: 28 mmol/L (ref 20–31)
Chloride: 100 mmol/L (ref 98–110)
Creat: 0.92 mg/dL (ref 0.50–1.05)
GFR, EST AFRICAN AMERICAN: 82 mL/min (ref >=60)
GFR, EST NON AFRICAN AMERICAN: 71 mL/min (ref >=60)
Glucose, Bld: 82 mg/dL (ref 65–99)
Potassium: 3.8 mmol/L (ref 3.5–5.3)
Sodium: 139 mmol/L (ref 135–146)
TOTAL PROTEIN: 7.4 g/dL (ref 6.1–8.1)

## 2016-04-08 LAB — TSH: TSH: 0.8 mIU/L

## 2016-04-12 ENCOUNTER — Ambulatory Visit: Payer: Self-pay | Admitting: Family Medicine

## 2016-04-12 DIAGNOSIS — H26492 Other secondary cataract, left eye: Secondary | ICD-10-CM | POA: Diagnosis not present

## 2016-04-21 ENCOUNTER — Encounter: Payer: Self-pay | Admitting: Family Medicine

## 2016-04-21 ENCOUNTER — Encounter (INDEPENDENT_AMBULATORY_CARE_PROVIDER_SITE_OTHER): Payer: Self-pay

## 2016-04-21 ENCOUNTER — Ambulatory Visit (INDEPENDENT_AMBULATORY_CARE_PROVIDER_SITE_OTHER): Payer: Medicare Other | Admitting: Family Medicine

## 2016-04-21 VITALS — BP 120/84 | HR 86 | Resp 16 | Ht 62.0 in | Wt 114.1 lb

## 2016-04-21 DIAGNOSIS — F329 Major depressive disorder, single episode, unspecified: Secondary | ICD-10-CM

## 2016-04-21 DIAGNOSIS — I10 Essential (primary) hypertension: Secondary | ICD-10-CM

## 2016-04-21 DIAGNOSIS — E785 Hyperlipidemia, unspecified: Secondary | ICD-10-CM

## 2016-04-21 DIAGNOSIS — M542 Cervicalgia: Secondary | ICD-10-CM | POA: Diagnosis not present

## 2016-04-21 DIAGNOSIS — K648 Other hemorrhoids: Secondary | ICD-10-CM | POA: Diagnosis not present

## 2016-04-21 DIAGNOSIS — K219 Gastro-esophageal reflux disease without esophagitis: Secondary | ICD-10-CM

## 2016-04-21 DIAGNOSIS — K5900 Constipation, unspecified: Secondary | ICD-10-CM

## 2016-04-21 DIAGNOSIS — F32A Depression, unspecified: Secondary | ICD-10-CM

## 2016-04-21 DIAGNOSIS — K5909 Other constipation: Secondary | ICD-10-CM

## 2016-04-21 MED ORDER — NAPROXEN 375 MG PO TABS: 375.0000 mg | ORAL_TABLET | Freq: Two times a day (BID) | ORAL | Status: DC

## 2016-04-21 MED ORDER — TRIAMTERENE-HCTZ 37.5-25 MG PO TABS: 1.0000 | ORAL_TABLET | Freq: Every day | ORAL | Status: DC

## 2016-04-21 MED ORDER — METHYLPREDNISOLONE ACETATE 80 MG/ML IJ SUSP
80.0000 mg | Freq: Once | INTRAMUSCULAR | Status: AC
  Administered 2016-04-21: 80 mg via INTRAMUSCULAR

## 2016-04-21 MED ORDER — KETOROLAC TROMETHAMINE 60 MG/2ML IM SOLN
60.0000 mg | Freq: Once | INTRAMUSCULAR | Status: AC
  Administered 2016-04-21: 60 mg via INTRAMUSCULAR

## 2016-04-21 MED ORDER — ROSUVASTATIN CALCIUM 10 MG PO TABS: ORAL_TABLET | ORAL | Status: DC

## 2016-04-21 MED ORDER — HYDROCORTISONE ACETATE 10 % RE FOAM: 1.0000 | Freq: Two times a day (BID) | RECTAL | Status: DC

## 2016-04-21 MED ORDER — PREDNISONE 5 MG PO TABS: 5.0000 mg | ORAL_TABLET | Freq: Two times a day (BID) | ORAL | Status: AC

## 2016-04-21 NOTE — Patient Instructions (Signed)
Annual wellness in 4 month, call if you need me before  Injections today for neck and left leg pain and naproxen and prednisone sent for 5 days  The swellings in your mouth are overgrown bones (tora) and can be watched by your dentist   Medication sent for hemorrhoids and take stool softener every day  Excellent labs  Thank you  for choosing [facility]. We consider it a privelige to serve you.  Delivering excellent health care in a caring and  compassionate way is our goal.  Partnering with you,  so that together we can achieve this goal is our strategy.

## 2016-04-21 NOTE — Progress Notes (Signed)
   Subjective:    Patient ID: Toni Miller, female    DOB: [birth year], 55 y.o.   MRN: [id number]  HPI    Review of Systems     Objective:   Physical Exam        Assessment & Plan:

## 2016-04-21 NOTE — Assessment & Plan Note (Signed)
Uncontrolled.Toradol and depo medrol administered IM in the office , to be followed by a short course of oral prednisone and NSAIDS.  

## 2016-04-25 NOTE — Assessment & Plan Note (Signed)
Controlled, no change in medication  

## 2016-04-25 NOTE — Progress Notes (Signed)
   Toni Miller     MRN: [id number]      DOB: November 25, 1960   HPI Toni Miller is here for follow up and re-evaluation of chronic medical conditions, medication management and review of any available recent lab and radiology data.  Preventive health is updated, specifically  Cancer screening and Immunization.   Questions or concerns regarding consultations or procedures which the PT has had in the interim are  addressed. The PT denies any adverse reactions to current medications since the last visit.  C/O increased  And uncontrolled right neck pain x 10 days, no specific trauma , also left leg pain, does have chronic neck pain C/o flare of rectal pain from piles in past 2 weeks , wants help  ROS Denies recent fever or chills. Denies sinus pressure, nasal congestion, ear pain or sore throat. Denies chest congestion, productive cough or wheezing. Denies chest pains, palpitations and leg swelling Denies abdominal pain, nausea, vomiting,diarrhea or constipation.   Denies dysuria, frequency, hesitancy or incontinence.  Denies headaches, seizures, numbness, or tingling. Denies uncontrolled epression, anxiety or insomnia. Denies skin break down or rash.   PE  BP 120/84 mmHg  Pulse 86  Resp 16  Ht 5\' 2"  (1.575 m)  Wt 114 lb 1.9 oz (51.764 kg)  BMI 20.87 kg/m2  SpO2 99%  Patient alert and oriented and in no cardiopulmonary distress.  HEENT: No facial asymmetry, EOMI,   oropharynx pink and moist.  Neck decreased ROM no JVD, no mass.  Chest: Clear to auscultation bilaterally.  CVS: S1, S2 no murmurs, no S3.Regular rate.  ABD: Soft non tender.   Ext: No edema  MS: Adequate ROM spine, shoulders, hips and knees.  Skin: Intact, no ulcerations or rash noted.  Psych: Good eye contact, normal affect. Memory intact not anxious or depressed appearing.  CNS: CN 2-12 intact, power,  normal throughout.no focal deficits noted.   Assessment & Plan   Neck pain on right  side Uncontrolled.Toradol and depo medrol administered IM in the office , to be followed by a short course of oral prednisone and NSAIDS.   Internal hemorrhoids with complication Current flare of pain and swelling , topical prep prescribed  Depression Controlled, no change in medication   Essential hypertension Controlled, no change in medication DASH diet and commitment to daily physical activity for a minimum of 30 minutes discussed and encouraged, as a part of hypertension management. The importance of attaining a healthy weight is also discussed.  BP/Weight 04/21/2016 02/29/2016 11/30/2015 10/21/2015 07/28/2015 123XX123 [id number]  Systolic BP 123456 [id number]  Diastolic BP 84 90 66 60 82 80 73  Wt. (Lbs) 114.12 117 112.12 114.08 113.8 114 113.6  BMI 20.87 21.39 21.9 22.28 22.22 22.26 22.93        Hyperlipidemia with target LDL less than 100 Controlled, no change in medication Hyperlipidemia:Low fat diet discussed and encouraged.   Lipid Panel  Lab Results  Component Value Date   CHOL 176 04/07/2016   HDL 97 04/07/2016   LDLCALC 68 04/07/2016   TRIG 53 04/07/2016   CHOLHDL 1.8 04/07/2016        GERD Controlled, no change in medication   Chronic constipation Inadequately controlled, high fiber diet , adequate water and daily stool sofener recommended

## 2016-04-25 NOTE — Assessment & Plan Note (Signed)
Controlled, no change in medication DASH diet and commitment to daily physical activity for a minimum of 30 minutes discussed and encouraged, as a part of hypertension management. The importance of attaining a healthy weight is also discussed.  BP/Weight 04/21/2016 02/29/2016 11/30/2015 10/21/2015 07/28/2015 [id number]  Systolic BP 123456 [id number]  Diastolic BP 84 90 66 60 82 80 73  Wt. (Lbs) 114.12 117 112.12 114.08 113.8 114 113.6  BMI 20.87 21.39 21.9 22.28 22.22 22.26 22.93

## 2016-04-25 NOTE — Assessment & Plan Note (Signed)
Inadequately controlled, high fiber diet , adequate water and daily stool sofener recommended

## 2016-04-25 NOTE — Assessment & Plan Note (Signed)
Controlled, no change in medication Hyperlipidemia:Low fat diet discussed and encouraged.   Lipid Panel  Lab Results  Component Value Date   CHOL 176 04/07/2016   HDL 97 04/07/2016   LDLCALC 68 04/07/2016   TRIG 53 04/07/2016   CHOLHDL 1.8 04/07/2016

## 2016-04-25 NOTE — Assessment & Plan Note (Signed)
Current flare of pain and swelling , topical prep prescribed

## 2016-06-13 ENCOUNTER — Other Ambulatory Visit: Payer: Self-pay | Admitting: Family Medicine

## 2016-08-02 ENCOUNTER — Other Ambulatory Visit: Payer: Self-pay | Admitting: Family Medicine

## 2016-08-04 ENCOUNTER — Other Ambulatory Visit: Payer: Self-pay | Admitting: Family Medicine

## 2016-08-04 DIAGNOSIS — Z1231 Encounter for screening mammogram for malignant neoplasm of breast: Secondary | ICD-10-CM

## 2016-08-25 ENCOUNTER — Other Ambulatory Visit: Payer: Self-pay

## 2016-08-25 MED ORDER — TRIAMTERENE-HCTZ 37.5-25 MG PO TABS: 1.0000 | ORAL_TABLET | Freq: Every day | ORAL | 1 refills | Status: DC

## 2016-08-25 MED ORDER — GABAPENTIN 300 MG PO CAPS
300.0000 mg | ORAL_CAPSULE | Freq: Every day | ORAL | 1 refills | Status: DC
Start: 2016-08-25 — End: 2016-11-15

## 2016-08-25 MED ORDER — ROSUVASTATIN CALCIUM 10 MG PO TABS: ORAL_TABLET | ORAL | 1 refills | Status: DC

## 2016-08-25 MED ORDER — ESOMEPRAZOLE MAGNESIUM 40 MG PO CPDR: DELAYED_RELEASE_CAPSULE | ORAL | 1 refills | Status: DC

## 2016-08-25 MED ORDER — VENLAFAXINE HCL ER 75 MG PO CP24: ORAL_CAPSULE | ORAL | 1 refills | Status: DC

## 2016-08-25 MED ORDER — MONTELUKAST SODIUM 10 MG PO TABS: 10.0000 mg | ORAL_TABLET | Freq: Every day | ORAL | 1 refills | Status: DC

## 2016-08-25 MED ORDER — HYDROXYZINE PAMOATE 25 MG PO CAPS: ORAL_CAPSULE | ORAL | 1 refills | Status: DC

## 2016-09-01 ENCOUNTER — Ambulatory Visit (HOSPITAL_COMMUNITY)
Admission: RE | Admit: 2016-09-01 | Discharge: 2016-09-01 | Disposition: A | Discharge status: Home / Self Care | Payer: Medicare Other | Source: Ambulatory Visit | Attending: Family Medicine | Admitting: Family Medicine

## 2016-09-01 DIAGNOSIS — Z1231 Encounter for screening mammogram for malignant neoplasm of breast: Secondary | ICD-10-CM | POA: Diagnosis not present

## 2016-09-08 ENCOUNTER — Emergency Department (HOSPITAL_COMMUNITY)
Admission: EM | Admit: 2016-09-08 | Discharge: 2016-09-08 | Disposition: A | Discharge status: Home / Self Care | Payer: Medicare Other | Attending: Emergency Medicine | Admitting: Emergency Medicine

## 2016-09-08 ENCOUNTER — Encounter (HOSPITAL_COMMUNITY): Payer: Self-pay | Admitting: Emergency Medicine

## 2016-09-08 DIAGNOSIS — H9202 Otalgia, left ear: Secondary | ICD-10-CM | POA: Diagnosis not present

## 2016-09-08 DIAGNOSIS — I10 Essential (primary) hypertension: Secondary | ICD-10-CM | POA: Insufficient documentation

## 2016-09-08 DIAGNOSIS — Z79899 Other long term (current) drug therapy: Secondary | ICD-10-CM | POA: Insufficient documentation

## 2016-09-08 DIAGNOSIS — Z87891 Personal history of nicotine dependence: Secondary | ICD-10-CM | POA: Diagnosis not present

## 2016-09-08 MED ORDER — PREDNISONE 10 MG PO TABS: ORAL_TABLET | ORAL | 0 refills | Status: DC

## 2016-09-08 NOTE — ED Provider Notes (Signed)
[facility] DEPT Provider Note   CSN: [id number] Arrival date & time: 09/08/16  1749     History   Chief Complaint Chief Complaint  Patient presents with  . Otalgia    left    HPI Toni Miller is a 55 y.o. female.  Patient presents for left ear pain that started last week, resolved and returned today. No injury, drainage, bleeding or hearing change. No fever, sore throat, congestion. No dizziness or nausea. She denies introducing anything into the ear canal. She reports a history of allergies that she takes Singulair for but denies any symptoms of allergies currently.   The history is provided by the patient. No language interpreter was used.  Otalgia     Past Medical History:  Diagnosis Date  . Cancer ([place]) 1993   abnormal pap treated at [facility]  . Constipation   . GERD (gastroesophageal reflux disease)   . Glaucoma 1993  . Hyperlipidemia   . Hypertension   . Neck pain 7/09   WITH BULGING DISC-----RECIEVING EPIDURALS   . Sinusitis     Patient Active Problem List   Diagnosis Date Noted  . Neck pain on right side 04/21/2016  . Allergic cough 10/21/2015  . Generalized rash 07/28/2015  . Pruritic rash 07/28/2015  . Arthritis of both knees 04/25/2015  . Internal hemorrhoids with complication [id number]  . Osteopenia 04/02/2014  . Depression 01/06/2014  . H/O abnormal Pap smear 04/20/2013  . Thyroid nodule 08/16/2011  . Hot flashes, menopausal 06/12/2011  . Chronic constipation 06/01/2011  . BACK PAIN WITH RADICULOPATHY 12/29/2009  . Allergic rhinitis 05/14/2008  . Hyperlipidemia with target LDL less than 100 01/29/2008  . GLAUCOMA 01/29/2008  . Essential hypertension 01/29/2008  . GERD 01/29/2008    Past Surgical History:  Procedure Laterality Date  . ABDOMINAL EXPLORATION SURGERY  age 75   bowel obstruction, [facility]  . ABDOMINAL HYSTERECTOMY  2007   [facility], [facility]  . BILATERAL EYE SURGERY     for glaucoma  . BREAST SURGERY Left 2004   left partial  mastectomy, [facility]  . CATARACT EXTRACTION Right 06/04/2015  . CATARACT EXTRACTION W/PHACO  05/16/2011   Procedure: CATARACT EXTRACTION PHACO AND INTRAOCULAR LENS PLACEMENT (IOC);  Surgeon: Tonny Branch;  Location: [facility];  Service: Ophthalmology;  Laterality: Right;  . COLONOSCOPY N/A 05/17/2013   Dr. Barnie Alderman diverticulosis was noted/small internal hemorrhoids  . CYST REMOVED LEFT BREAST /BENIGN  2005   left, [facility]  . EYE SURGERY  2010, 1992 approx   bilateral  . REPAIR IMPERFORATE ANUS / ANORECTOPLASTY     per patient  . TOTAL ABDOMINAL HYSTERECTOMY W/ BILATERAL SALPINGOOPHORECTOMY  2007   [facility], [place]  . TUBAL LIGATION  1991    OB History    No data available       Home Medications    Prior to Admission medications   Medication Sig Start Date End Date Taking? Authorizing Provider  calcium-vitamin D (OSCAL WITH D) 500-200 MG-UNIT per tablet Take 1 tablet by mouth 2 (two) times daily. 04/02/14   [name], MD  cyclobenzaprine (FLEXERIL) 10 MG tablet Take 1 tablet (10 mg total) by mouth 3 (three) times daily as needed for muscle spasms. 02/29/16   Milton Ferguson, MD  docusate sodium (COLACE) 100 MG capsule Take 100 mg by mouth daily.    Historical Provider, MD  esomeprazole (NEXIUM) 40 MG capsule TAKE ONE CAPSULE BY MOUTH ONCE DAILY. 08/25/16   [name], MD  fluticasone (FLONASE) 50 MCG/ACT  nasal spray USE 2 SPRAYS IN EACH NOSTRIL ONCE DAILY. 02/24/15   [name], MD  gabapentin (NEURONTIN) 300 MG capsule Take 1 capsule (300 mg total) by mouth at bedtime. 08/25/16   [name], MD  hydrocortisone (PROCTOCORT) 10 % rectal foam Place 1 applicator rectally 2 (two) times daily. 04/21/16   [name], MD  hydrOXYzine (VISTARIL) 25 MG capsule TAKE 1 CAPSULE BY MOUTH THREE TIMES DAILY AS NEEDED. 08/25/16   [name], MD  lubiprostone (AMITIZA) 8 MCG capsule Take 1 capsule (8 mcg total) by mouth 2 (two) times daily with a meal. 12/26/14   Mahala Menghini, PA-C  mometasone (NASONEX) 50 MCG/ACT nasal spray Place 2 sprays into the nose daily. 10/21/15   [name], MD  montelukast (SINGULAIR) 10 MG tablet Take 1 tablet (10 mg total) by mouth daily. 08/25/16   [name], MD  Multiple Vitamin (MULTIVITAMIN) capsule Take 1 capsule by mouth daily.    Historical Provider, MD  naproxen (NAPROSYN) 375 MG tablet Take 1 tablet (375 mg total) by mouth 2 (two) times daily with a meal. 04/21/16   [name], MD  polyethylene glycol powder (GLYCOLAX/MIRALAX) powder MIX 1 CAPFUL (17G) IN 8 OUNCES OF JUICE/WATER AND DRINK ONCE DAILY. 10/14/14   [name], MD  potassium chloride (K-DUR) 10 MEQ tablet Take 1 tablet (10 mEq total) by mouth daily as needed. Patient taking differently: Take 10 mEq by mouth daily as needed (low potassium).  12/29/15   [name], MD  rosuvastatin (CRESTOR) 10 MG tablet TAKE (1) TABLET BY MOUTH AT BEDTIME. 08/25/16   [name], MD  TRAVATAN Z 0.004 % SOLN ophthalmic solution Place 1 drop into both eyes at bedtime. 02/16/16   Historical Provider, MD  triamterene-hydrochlorothiazide (MAXZIDE-25) 37.5-25 MG tablet Take 1 tablet by mouth daily. 08/25/16   [name], MD  venlafaxine XR (EFFEXOR-XR) 75 MG 24 hr capsule TAKE ONE CAPSULE BY MOUTH DAILY WITH BREAKFAST. 08/25/16   [name], MD    Family History Family History  Problem Relation Age of Onset  . Adopted: Yes  . Alcohol abuse Mother   . Cancer Mother 30    stomach  . Lung cancer Father   . Glaucoma Father   . Cancer Father   . Alcohol abuse Father   . Cancer Sister 4  . Cancer Sister 54    breast   . Heart disease Sister   . Breast cancer Sister   . Colon cancer Neg Hx     Social History Social History  Substance Use Topics  . Smoking status: Former Smoker    Quit date: 05/12/1991  . Smokeless tobacco: Never Used  . Alcohol use Yes     Comment: drinks a beer 1 x month     Allergies     Acyclovir and related   Review of Systems Review of Systems  Constitutional: Negative for chills and fever.  HENT: Positive for ear pain. Negative for congestion, sinus pressure, sneezing and trouble swallowing.   Eyes: Negative for pain.  Gastrointestinal: Negative.  Negative for nausea.  Musculoskeletal: Negative.  Negative for neck stiffness.  Skin: Negative.   Neurological: Negative.  Negative for dizziness.     Physical Exam Updated Vital Signs BP 140/86 (BP Location: Left Arm)   Pulse 82   Temp 97.8 F (36.6 C) (Oral)   Resp 14   Ht 5\' 1"  (1.549 m)   Wt  54.4 kg   SpO2 100%   BMI 22.67 kg/m   Physical Exam  Constitutional: She is oriented to person, place, and time. She appears well-developed and well-nourished. No distress.  HENT:  Right Ear: Tympanic membrane normal. No swelling. No mastoid tenderness. Tympanic membrane is not injected and not erythematous.  Left Ear: Tympanic membrane normal. No swelling. No mastoid tenderness. Tympanic membrane is not injected and not erythematous.  Nose: No mucosal edema.  Eyes: Conjunctivae are normal.  Neck: Normal range of motion.  Pulmonary/Chest: Effort normal.  Abdominal: Soft.  Neurological: She is alert and oriented to person, place, and time.  Skin: Skin is warm and dry.     ED Treatments / Results  Labs (all labs ordered are listed, but only abnormal results are displayed) Labs Reviewed - No data to display  EKG  EKG Interpretation None       Radiology No results found.  Procedures Procedures (including critical care time)  Medications Ordered in ED Medications - No data to display   Initial Impression / Assessment and Plan / ED Course  I have reviewed the triage vital signs and the nursing notes.  Pertinent labs & imaging results that were available during my care of the patient were reviewed by me and considered in my medical decision making (see chart for details).  Clinical Course     Left ear pain twice over the last one week. No fever or evidence/concern for infection. TM is not red or injected. Will place on steroid taper. She is to resume her Flonase and follow up with her doctor if symptoms persist. Return precautions discussed.   Final Clinical Impressions(s) / ED Diagnoses   Final diagnoses:  None  1. Left ear pain   New Prescriptions New Prescriptions   No medications on file     Charlann Lange, Hershal Coria 09/08/16 1824    Isla Pence, MD 09/08/16 2144

## 2016-09-15 ENCOUNTER — Ambulatory Visit (INDEPENDENT_AMBULATORY_CARE_PROVIDER_SITE_OTHER): Payer: Medicare Other

## 2016-09-15 VITALS — BP 140/80 | HR 90 | Resp 18 | Ht 62.0 in | Wt 125.0 lb

## 2016-09-15 DIAGNOSIS — M810 Age-related osteoporosis without current pathological fracture: Secondary | ICD-10-CM

## 2016-09-15 DIAGNOSIS — Z0000 Encounter for general adult medical examination without abnormal findings: Secondary | ICD-10-CM | POA: Diagnosis not present

## 2016-09-15 NOTE — Patient Instructions (Signed)
Thank you for choosing [facility] for your health care needs  The Annual Wellness Visit is designed to allow [place] the chance to assist you in preserving and improving you health.   Dr. Moshe Cipro will see you back in Feb/March for your next visit  If any labs are needed they will be mailed to you with the approxiamate date to have them drawn  We will call you with the appointment for your bone density scan  If you have any questions feel free to contact the office

## 2016-09-17 NOTE — Progress Notes (Signed)
Subjective:    Toni Miller is a 55 y.o. female who presents for Medicare Annual/Subsequent preventive examination.  Preventive Screening-Counseling & Management  Tobacco History  Smoking Status  . Former Smoker  . Quit date: 05/12/1991  Smokeless Tobacco  . Never Used      Current Problems (verified) Patient Active Problem List   Diagnosis Date Noted  . Neck pain on right side 04/21/2016  . Allergic cough 10/21/2015  . Generalized rash 07/28/2015  . Pruritic rash 07/28/2015  . Arthritis of both knees 04/25/2015  . Internal hemorrhoids with complication 99991111  . Osteopenia 04/02/2014  . Depression 01/06/2014  . H/O abnormal Pap smear 04/20/2013  . Thyroid nodule 08/16/2011  . Hot flashes, menopausal 06/12/2011  . Chronic constipation 06/01/2011  . BACK PAIN WITH RADICULOPATHY 12/29/2009  . Allergic rhinitis 05/14/2008  . Hyperlipidemia with target LDL less than 100 01/29/2008  . GLAUCOMA 01/29/2008  . Essential hypertension 01/29/2008  . GERD 01/29/2008    Medications Prior to Visit Current Outpatient Prescriptions on File Prior to Visit  Medication Sig Dispense Refill  . calcium-vitamin D (OSCAL WITH D) 500-200 MG-UNIT per tablet Take 1 tablet by mouth 2 (two) times daily. 60 tablet 5  . cyclobenzaprine (FLEXERIL) 10 MG tablet Take 1 tablet (10 mg total) by mouth 3 (three) times daily as needed for muscle spasms. 15 tablet 0  . docusate sodium (COLACE) 100 MG capsule Take 100 mg by mouth daily.    Marland Kitchen esomeprazole (NEXIUM) 40 MG capsule TAKE ONE CAPSULE BY MOUTH ONCE DAILY. 90 capsule 1  . fluticasone (FLONASE) 50 MCG/ACT nasal spray USE 2 SPRAYS IN EACH NOSTRIL ONCE DAILY. 16 g 4  . gabapentin (NEURONTIN) 300 MG capsule Take 1 capsule (300 mg total) by mouth at bedtime. 90 capsule 1  . hydrocortisone (PROCTOCORT) 10 % rectal foam Place 1 applicator rectally 2 (two) times daily. 15 g 1  . hydrOXYzine (VISTARIL) 25 MG capsule TAKE 1 CAPSULE BY MOUTH THREE TIMES  DAILY AS NEEDED. 90 capsule 1  . lubiprostone (AMITIZA) 8 MCG capsule Take 1 capsule (8 mcg total) by mouth 2 (two) times daily with a meal. 60 capsule 11  . mometasone (NASONEX) 50 MCG/ACT nasal spray Place 2 sprays into the nose daily. 17 g 12  . montelukast (SINGULAIR) 10 MG tablet Take 1 tablet (10 mg total) by mouth daily. 90 tablet 1  . Multiple Vitamin (MULTIVITAMIN) capsule Take 1 capsule by mouth daily.    . naproxen (NAPROSYN) 375 MG tablet Take 1 tablet (375 mg total) by mouth 2 (two) times daily with a meal. 10 tablet 0  . polyethylene glycol powder (GLYCOLAX/MIRALAX) powder MIX 1 CAPFUL (17G) IN 8 OUNCES OF JUICE/WATER AND DRINK ONCE DAILY. 255 g 3  . potassium chloride (K-DUR) 10 MEQ tablet Take 1 tablet (10 mEq total) by mouth daily as needed. (Patient taking differently: Take 10 mEq by mouth daily as needed (low potassium). ) 30 tablet 0  . rosuvastatin (CRESTOR) 10 MG tablet TAKE (1) TABLET BY MOUTH AT BEDTIME. 90 tablet 1  . TRAVATAN Z 0.004 % SOLN ophthalmic solution Place 1 drop into both eyes at bedtime.    . triamterene-hydrochlorothiazide (MAXZIDE-25) 37.5-25 MG tablet Take 1 tablet by mouth daily. 90 tablet 1  . venlafaxine XR (EFFEXOR-XR) 75 MG 24 hr capsule TAKE ONE CAPSULE BY MOUTH DAILY WITH BREAKFAST. 90 capsule 1   No current facility-administered medications on file prior to visit.     Current Medications (verified) Current  Outpatient Prescriptions  Medication Sig Dispense Refill  . calcium-vitamin D (OSCAL WITH D) 500-200 MG-UNIT per tablet Take 1 tablet by mouth 2 (two) times daily. 60 tablet 5  . cyclobenzaprine (FLEXERIL) 10 MG tablet Take 1 tablet (10 mg total) by mouth 3 (three) times daily as needed for muscle spasms. 15 tablet 0  . docusate sodium (COLACE) 100 MG capsule Take 100 mg by mouth daily.    Marland Kitchen esomeprazole (NEXIUM) 40 MG capsule TAKE ONE CAPSULE BY MOUTH ONCE DAILY. 90 capsule 1  . fluticasone (FLONASE) 50 MCG/ACT nasal spray USE 2 SPRAYS IN  EACH NOSTRIL ONCE DAILY. 16 g 4  . gabapentin (NEURONTIN) 300 MG capsule Take 1 capsule (300 mg total) by mouth at bedtime. 90 capsule 1  . hydrocortisone (PROCTOCORT) 10 % rectal foam Place 1 applicator rectally 2 (two) times daily. 15 g 1  . hydrOXYzine (VISTARIL) 25 MG capsule TAKE 1 CAPSULE BY MOUTH THREE TIMES DAILY AS NEEDED. 90 capsule 1  . lubiprostone (AMITIZA) 8 MCG capsule Take 1 capsule (8 mcg total) by mouth 2 (two) times daily with a meal. 60 capsule 11  . mometasone (NASONEX) 50 MCG/ACT nasal spray Place 2 sprays into the nose daily. 17 g 12  . montelukast (SINGULAIR) 10 MG tablet Take 1 tablet (10 mg total) by mouth daily. 90 tablet 1  . Multiple Vitamin (MULTIVITAMIN) capsule Take 1 capsule by mouth daily.    . naproxen (NAPROSYN) 375 MG tablet Take 1 tablet (375 mg total) by mouth 2 (two) times daily with a meal. 10 tablet 0  . polyethylene glycol powder (GLYCOLAX/MIRALAX) powder MIX 1 CAPFUL (17G) IN 8 OUNCES OF JUICE/WATER AND DRINK ONCE DAILY. 255 g 3  . potassium chloride (K-DUR) 10 MEQ tablet Take 1 tablet (10 mEq total) by mouth daily as needed. (Patient taking differently: Take 10 mEq by mouth daily as needed (low potassium). ) 30 tablet 0  . rosuvastatin (CRESTOR) 10 MG tablet TAKE (1) TABLET BY MOUTH AT BEDTIME. 90 tablet 1  . TRAVATAN Z 0.004 % SOLN ophthalmic solution Place 1 drop into both eyes at bedtime.    . triamterene-hydrochlorothiazide (MAXZIDE-25) 37.5-25 MG tablet Take 1 tablet by mouth daily. 90 tablet 1  . venlafaxine XR (EFFEXOR-XR) 75 MG 24 hr capsule TAKE ONE CAPSULE BY MOUTH DAILY WITH BREAKFAST. 90 capsule 1   No current facility-administered medications for this visit.      Allergies (verified) Acyclovir and related   PAST HISTORY  Family History Family History  Problem Relation Age of Onset  . Adopted: Yes  . Alcohol abuse Mother   . Cancer Mother 30    stomach  . Lung cancer Father   . Glaucoma Father   . Cancer Father   . Alcohol  abuse Father   . Cancer Sister 29  . Cancer Sister 35    breast   . Heart disease Sister   . Breast cancer Sister   . Colon cancer Neg Hx     Social History Social History  Substance Use Topics  . Smoking status: Former Smoker    Quit date: 05/12/1991  . Smokeless tobacco: Never Used  . Alcohol use Yes     Comment: drinks a beer 1 x month     Are there smokers in your home (other than you)? No  Risk Factors Current exercise habits: The patient does not participate in regular exercise at present.  Dietary issues discussed: changes with food labels   Cardiac risk factors:  dyslipidemia and hypertension.  Depression Screen (Note: if answer to either of the following is "Yes", a more complete depression screening is indicated)   Over the past two weeks, have you felt down, depressed or hopeless? No  Over the past two weeks, have you felt little interest or pleasure in doing things? No  Have you lost interest or pleasure in daily life? No  Do you often feel hopeless? No  Do you cry easily over simple problems? No  Activities of Daily Living In your present state of health, do you have any difficulty performing the following activities?:  Driving? Yes Managing money?  No Feeding yourself? No Getting from bed to chair? No Climbing a flight of stairs? No Preparing food and eating?: No Bathing or showering? No Getting dressed: No Getting to the toilet? No Using the toilet:No Moving around from place to place: No In the past year have you fallen or had a near fall?:No   Are you sexually active?  No  Do you have more than one partner?  Na  Hearing Difficulties: No Do you often ask people to speak up or repeat themselves? No Do you experience ringing or noises in your ears? No Do you have difficulty understanding soft or whispered voices? No   Do you feel that you have a problem with memory? No  Do you often misplace items? No  Do you feel safe at home?  Yes  Cognitive  Testing  Alert? Yes  Normal Appearance?Yes  Oriented to person? Yes  Place? Yes   Time? Yes  Recall of three objects?  Yes  Can perform simple calculations? Yes  Displays appropriate judgment?Yes  Can read the correct time from a watch face?Yes   Advanced Directives have been discussed with the patient? No  List the Names of Other Physician/Practitioners you currently use: 1.    Indicate any recent Medical Services you may have received from other than [facility] providers in the past year (date may be approximate).  Immunization History  Administered Date(s) Administered  . Influenza Split 07/27/2011, 07/26/2012, 08/16/2016  . Influenza Whole 01/08/2008, 08/07/2008  . Influenza,inj,Quad PF,36+ Mos 08/21/2013, 08/11/2014, 07/28/2015  . Td 05/25/2004  . Tdap 02/24/2015    Screening Tests Health Maintenance  Topic Date Due  . PAP SMEAR  04/19/2016  . MAMMOGRAM  09/01/2018  . COLONOSCOPY  05/18/2023  . TETANUS/TDAP  02/23/2025  . INFLUENZA VACCINE  Completed  . Hepatitis C Screening  Completed  . HIV Screening  Completed    All answers were reviewed with the patient and necessary referrals were made:  Toni Mulders, LPN   [id number]   History reviewed: allergies, current medications, past family history, past medical history, past social history, past surgical history and problem list  Review of Systems A comprehensive review of systems was negative.    Objective:     Vision by Snellen chart: right eye:20/20, left eye:20/20  Body mass index is 22.86 kg/m. BP 140/80   Pulse 90   Resp 18   Ht 5\' 2"  (1.575 m)   Wt 125 lb (56.7 kg)   SpO2 98%   BMI 22.86 kg/m   No exam performed today, annual wellness without physical exam.     Assessment:   Medicare annual wellness visit, subsequent Annual exam as documented. Counseling done  re healthy lifestyle involving commitment to 150 minutes exercise per week, heart healthy diet, and attaining healthy  weight.The importance of adequate sleep also discussed. Regular seat belt use  and home safety, is also discussed. Changes in health habits are decided on by the patient with goals and time frames  set for achieving them. Immunization and cancer screening needs are specifically addressed at this visit.   Need for prophylactic vaccination and inoculation against influenza After obtaining informed consent, the vaccine is  administered by LPN.        Plan:     During the course of the visit the patient was educated and counseled about appropriate screening and preventive services including:    Bone densitometry screening  Nutrition counseling   Diet review for nutrition referral? Yes ____  Not Indicated _x___   Patient Instructions (the written plan) was given to the patient.  Medicare Attestation I have personally reviewed: The patient's medical and social history Their use of alcohol, tobacco or illicit drugs Their current medications and supplements The patient's functional ability including ADLs,fall risks, home safety risks, cognitive, and hearing and visual impairment Diet and physical activities Evidence for depression or mood disorders  The patient's weight, height, BMI, and visual acuity have been recorded in the chart.  I have made referrals, counseling, and provided education to the patient based on review of the above and I have provided the patient with a written personalized care plan for preventive services.     Toni Miller [place], [place]   [id number]

## 2016-09-18 NOTE — Assessment & Plan Note (Signed)

## 2016-09-18 NOTE — Assessment & Plan Note (Signed)
After obtaining informed consent, the vaccine is  administered by LPN.  

## 2016-09-23 ENCOUNTER — Ambulatory Visit (HOSPITAL_COMMUNITY)
Admission: RE | Admit: 2016-09-23 | Discharge: 2016-09-23 | Disposition: A | Discharge status: Home / Self Care | Payer: Medicare Other | Source: Ambulatory Visit | Attending: Family Medicine | Admitting: Family Medicine

## 2016-09-23 DIAGNOSIS — M85851 Other specified disorders of bone density and structure, right thigh: Secondary | ICD-10-CM | POA: Diagnosis not present

## 2016-09-23 DIAGNOSIS — M810 Age-related osteoporosis without current pathological fracture: Secondary | ICD-10-CM | POA: Diagnosis present

## 2016-09-23 DIAGNOSIS — M8589 Other specified disorders of bone density and structure, multiple sites: Secondary | ICD-10-CM | POA: Insufficient documentation

## 2016-10-01 ENCOUNTER — Telehealth: Payer: Self-pay

## 2016-10-01 NOTE — Telephone Encounter (Signed)
-----   Message from [name], MD sent at 09/25/2016  6:30 AM EST ----- Bones are thinning, right hip shows significant decrease since previous study in 2015, needs to commit to daily weight bearing exercise, and calcium and D supplement, pls explain

## 2016-10-01 NOTE — Telephone Encounter (Signed)
Attached patient education (preventing osteoporosis)

## 2016-10-12 ENCOUNTER — Other Ambulatory Visit: Payer: Self-pay | Admitting: Family Medicine

## 2016-10-18 ENCOUNTER — Encounter: Payer: Self-pay | Admitting: Family Medicine

## 2016-10-18 ENCOUNTER — Ambulatory Visit (INDEPENDENT_AMBULATORY_CARE_PROVIDER_SITE_OTHER): Payer: Medicare Other | Admitting: Family Medicine

## 2016-10-18 VITALS — BP 120/80 | HR 88 | Temp 98.3°F | Resp 16 | Ht 62.0 in | Wt 127.1 lb

## 2016-10-18 DIAGNOSIS — M67919 Unspecified disorder of synovium and tendon, unspecified shoulder: Secondary | ICD-10-CM | POA: Diagnosis not present

## 2016-10-18 DIAGNOSIS — M719 Bursopathy, unspecified: Secondary | ICD-10-CM

## 2016-10-18 DIAGNOSIS — H401131 Primary open-angle glaucoma, bilateral, mild stage: Secondary | ICD-10-CM | POA: Diagnosis not present

## 2016-10-18 MED ORDER — METHYLPREDNISOLONE ACETATE 80 MG/ML IJ SUSP
80.0000 mg | Freq: Once | INTRAMUSCULAR | Status: AC
  Administered 2016-10-18: 80 mg via INTRA_ARTICULAR

## 2016-10-18 NOTE — Patient Instructions (Addendum)
Ice to shoulder area Shot was given today I refilled naprosyn for pain Call in not improving an a week Shoulder Pain Many things can cause shoulder pain, including:  An injury to the area.  Overuse of the shoulder.  Arthritis. The source of the pain can be:  Inflammation.  An injury to the shoulder joint.  An injury to a tendon, ligament, or bone. Follow these instructions at home: Take these actions to help with your pain:  Squeeze a soft ball or a foam pad as much as possible. This helps to keep the shoulder from swelling. It also helps to strengthen the arm.  Take over-the-counter and prescription medicines only as told by your health care provider.  If directed, apply ice to the area:  Put ice in a plastic bag.  Place a towel between your skin and the bag.  Leave the ice on for 20 minutes, 2-3 times per day. Stop applying ice if it does not help with the pain.  If you were given a shoulder sling or immobilizer:  Wear it as told.  Remove it to shower or bathe.  Move your arm as little as possible, but keep your hand moving to prevent swelling. Contact a health care provider if:  Your pain gets worse.  Your pain is not relieved with medicines.  New pain develops in your arm, hand, or fingers. Get help right away if:  Your arm, hand, or fingers:  Tingle.  Become numb.  Become swollen.  Become painful.  Turn white or blue. This information is not intended to replace advice given to you by your health care provider. Make sure you discuss any questions you have with your health care provider. Document Released: 08/03/2005 Document Revised: 06/19/2016 Document Reviewed: 02/16/2015 Elsevier Interactive Patient Education  2017 [employer].

## 2016-10-18 NOTE — Progress Notes (Signed)
Chief Complaint  Patient presents with  . Shoulder Pain    right x 1 week   Right shoulder pain for 2 weeks Now can hardly raise arm No trauma or overuse History of bursitis in the left shoulder years ago, treated successfully with injection No pain in neck No radiation to arm or hand Deep ache type of pain, worse with movement keeps her awake at night    Patient Active Problem List   Diagnosis Date Noted  . Neck pain on right side 04/21/2016  . Allergic cough 10/21/2015  . Medicare annual wellness visit, subsequent 08/01/2015  . Arthritis of both knees 04/25/2015  . Internal hemorrhoids with complication 99991111  . Osteopenia 04/02/2014  . Depression 01/06/2014  . H/O abnormal Pap smear 04/20/2013  . Thyroid nodule 08/16/2011  . Hot flashes, menopausal 06/12/2011  . Chronic constipation 06/01/2011  . BACK PAIN WITH RADICULOPATHY 12/29/2009  . Allergic rhinitis 05/14/2008  . Hyperlipidemia with target LDL less than 100 01/29/2008  . GLAUCOMA 01/29/2008  . Essential hypertension 01/29/2008  . GERD 01/29/2008    Outpatient Encounter Prescriptions as of 10/18/2016  Medication Sig  . calcium-vitamin D (OSCAL WITH D) 500-200 MG-UNIT per tablet Take 1 tablet by mouth 2 (two) times daily.  . cyclobenzaprine (FLEXERIL) 10 MG tablet Take 1 tablet (10 mg total) by mouth 3 (three) times daily as needed for muscle spasms.  Marland Kitchen docusate sodium (COLACE) 100 MG capsule Take 100 mg by mouth daily.  Marland Kitchen esomeprazole (NEXIUM) 40 MG capsule TAKE ONE CAPSULE BY MOUTH ONCE DAILY.  . fluticasone (FLONASE) 50 MCG/ACT nasal spray USE 2 SPRAYS IN EACH NOSTRIL ONCE DAILY.  Marland Kitchen gabapentin (NEURONTIN) 300 MG capsule Take 1 capsule (300 mg total) by mouth at bedtime.  . hydrocortisone (PROCTOCORT) 10 % rectal foam Place 1 applicator rectally 2 (two) times daily.  . hydrOXYzine (VISTARIL) 25 MG capsule TAKE 1 CAPSULE BY MOUTH THREE TIMES DAILY AS NEEDED.  Marland Kitchen lubiprostone (AMITIZA) 8 MCG capsule  Take 1 capsule (8 mcg total) by mouth 2 (two) times daily with a meal.  . mometasone (NASONEX) 50 MCG/ACT nasal spray Place 2 sprays into the nose daily.  . montelukast (SINGULAIR) 10 MG tablet TAKE 1 TABLET BY MOUTH ONCE DAILY.  . Multiple Vitamin (MULTIVITAMIN) capsule Take 1 capsule by mouth daily.  . naproxen (NAPROSYN) 375 MG tablet Take 1 tablet (375 mg total) by mouth 2 (two) times daily with a meal.  . polyethylene glycol powder (GLYCOLAX/MIRALAX) powder MIX 1 CAPFUL (17G) IN 8 OUNCES OF JUICE/WATER AND DRINK ONCE DAILY.  Marland Kitchen potassium chloride (K-DUR) 10 MEQ tablet Take 1 tablet (10 mEq total) by mouth daily as needed. (Patient taking differently: Take 10 mEq by mouth daily as needed (low potassium). )  . rosuvastatin (CRESTOR) 10 MG tablet TAKE (1) TABLET BY MOUTH AT BEDTIME.  . TRAVATAN Z 0.004 % SOLN ophthalmic solution Place 1 drop into both eyes at bedtime.  . triamterene-hydrochlorothiazide (MAXZIDE-25) 37.5-25 MG tablet Take 1 tablet by mouth daily.  Marland Kitchen venlafaxine XR (EFFEXOR-XR) 75 MG 24 hr capsule TAKE ONE CAPSULE BY MOUTH DAILY WITH BREAKFAST.   No facility-administered encounter medications on file as of 10/18/2016.     Allergies  Allergen Reactions  . Acyclovir And Related Rash   Review of Systems  Constitutional: Positive for activity change. Negative for chills and fatigue.  HENT: Negative.   Eyes: Positive for visual disturbance.       Glaucoma  Respiratory: Negative for cough and  shortness of breath.   Cardiovascular: Negative for chest pain and leg swelling.  Gastrointestinal: Positive for blood in stool. Negative for constipation and diarrhea.       Hemorrhoids  Musculoskeletal: Positive for arthralgias. Negative for back pain, neck pain and neck stiffness.  Neurological: Negative for weakness and numbness.  Psychiatric/Behavioral: Positive for sleep disturbance. Negative for dysphoric mood. The patient is not nervous/anxious.     BP 120/80 (BP Location: Left  Arm, Patient Position: Sitting, Cuff Size: Normal)   Pulse 88   Temp 98.3 F (36.8 C) (Oral)   Resp 16   Ht 5\' 2"  (1.575 m)   Wt 127 lb 1.9 oz (57.7 kg)   SpO2 98%   BMI 23.25 kg/m   Physical Exam  Constitutional: She is oriented to person, place, and time. She appears well-developed and well-nourished.  HENT:  Head: Normocephalic and atraumatic.  Mouth/Throat: Oropharynx is clear and moist.  Eyes: Conjunctivae are normal. Pupils are equal, round, and reactive to light.  Neck: Normal range of motion. Neck supple.  Musculoskeletal:  Right shoulder with warmth and tenderness over subacromial bursa.  Limited ROM with pos impingement and limited internal rotation  Lymphadenopathy:    She has no cervical adenopathy.  Neurological: She is alert and oriented to person, place, and time. She displays normal reflexes. No sensory deficit. She exhibits normal muscle tone.  Skin: Skin is warm and dry.  Psychiatric: Her behavior is normal. Thought content normal.    ASSESSMENT/PLAN:  1. Disorder of bursae and tendons in shoulder region  Time out Consent Posterior (    Right    ) shoulder prepped and marked 1cc of 1% lidocaine wheal placed at injection site 3 cc of 1% lidocaine with 80 mg of DepoMedrol placed into subacromial space Patient tolerated procedure well Post injection instructions reviewed.   Patient Instructions  Ice to shoulder area Shot was given today I refilled naprosyn for pain Call in not improving an a week   Raylene Everts, MD

## 2016-10-19 ENCOUNTER — Telehealth: Payer: Self-pay

## 2016-10-19 ENCOUNTER — Other Ambulatory Visit: Payer: Self-pay

## 2016-10-19 DIAGNOSIS — M542 Cervicalgia: Secondary | ICD-10-CM

## 2016-10-19 MED ORDER — CYCLOBENZAPRINE HCL 10 MG PO TABS: 10.0000 mg | ORAL_TABLET | Freq: Three times a day (TID) | ORAL | 1 refills | Status: DC | PRN

## 2016-10-19 MED ORDER — NAPROXEN 375 MG PO TABS: 375.0000 mg | ORAL_TABLET | Freq: Two times a day (BID) | ORAL | 0 refills | Status: DC

## 2016-10-19 NOTE — Telephone Encounter (Signed)
Med refilled.

## 2016-10-19 NOTE — Telephone Encounter (Signed)
Refill x 2 please 

## 2016-11-15 ENCOUNTER — Other Ambulatory Visit: Payer: Self-pay | Admitting: Family Medicine

## 2016-12-22 ENCOUNTER — Telehealth: Payer: Self-pay | Admitting: Family Medicine

## 2017-01-04 ENCOUNTER — Ambulatory Visit (HOSPITAL_COMMUNITY)
Admission: RE | Admit: 2017-01-04 | Discharge: 2017-01-04 | Disposition: A | Discharge status: Home / Self Care | Payer: Medicare Other | Source: Ambulatory Visit | Attending: Family Medicine | Admitting: Family Medicine

## 2017-01-04 ENCOUNTER — Encounter: Payer: Self-pay | Admitting: Family Medicine

## 2017-01-04 ENCOUNTER — Ambulatory Visit: Payer: Self-pay

## 2017-01-04 ENCOUNTER — Ambulatory Visit (INDEPENDENT_AMBULATORY_CARE_PROVIDER_SITE_OTHER): Payer: Medicare Other | Admitting: Family Medicine

## 2017-01-04 ENCOUNTER — Other Ambulatory Visit (HOSPITAL_COMMUNITY)
Admission: RE | Admit: 2017-01-04 | Discharge: 2017-01-04 | Disposition: A | Discharge status: Home / Self Care | Payer: Medicare Other | Source: Ambulatory Visit | Attending: Family Medicine | Admitting: Family Medicine

## 2017-01-04 VITALS — BP 128/70 | HR 97 | Resp 18 | Ht 62.0 in | Wt 126.0 lb

## 2017-01-04 DIAGNOSIS — M5440 Lumbago with sciatica, unspecified side: Secondary | ICD-10-CM | POA: Insufficient documentation

## 2017-01-04 DIAGNOSIS — Z1151 Encounter for screening for human papillomavirus (HPV): Secondary | ICD-10-CM | POA: Diagnosis present

## 2017-01-04 DIAGNOSIS — Z01419 Encounter for gynecological examination (general) (routine) without abnormal findings: Secondary | ICD-10-CM | POA: Insufficient documentation

## 2017-01-04 DIAGNOSIS — Z1211 Encounter for screening for malignant neoplasm of colon: Secondary | ICD-10-CM | POA: Diagnosis not present

## 2017-01-04 DIAGNOSIS — E785 Hyperlipidemia, unspecified: Secondary | ICD-10-CM | POA: Diagnosis not present

## 2017-01-04 DIAGNOSIS — Z0000 Encounter for general adult medical examination without abnormal findings: Secondary | ICD-10-CM | POA: Diagnosis not present

## 2017-01-04 DIAGNOSIS — Z124 Encounter for screening for malignant neoplasm of cervix: Secondary | ICD-10-CM

## 2017-01-04 DIAGNOSIS — M545 Low back pain: Secondary | ICD-10-CM | POA: Diagnosis not present

## 2017-01-04 DIAGNOSIS — I10 Essential (primary) hypertension: Secondary | ICD-10-CM | POA: Diagnosis not present

## 2017-01-04 LAB — HEMOCCULT GUIAC POC 1CARD (OFFICE): Fecal Occult Blood, POC: NEGATIVE

## 2017-01-04 LAB — CBC
HCT: 41.7 % (ref 35.0–45.0)
Hemoglobin: 14.1 g/dL (ref 11.7–15.5)
MCH: 29.8 pg (ref 27.0–33.0)
MCHC: 33.8 g/dL (ref 32.0–36.0)
MCV: 88.2 fL (ref 80.0–100.0)
MPV: 8.6 fL (ref 7.5–12.5)
Platelets: 230 10*3/uL (ref 140–400)
RBC: 4.73 MIL/uL (ref 3.80–5.10)
RDW: 13.3 % (ref 11.0–15.0)
WBC: 5.1 10*3/uL (ref 3.8–10.8)

## 2017-01-04 MED ORDER — PREDNISONE 5 MG PO TABS: 5.0000 mg | ORAL_TABLET | Freq: Two times a day (BID) | ORAL | 0 refills | Status: AC

## 2017-01-04 MED ORDER — GABAPENTIN 300 MG PO CAPS: 300.0000 mg | ORAL_CAPSULE | Freq: Every day | ORAL | 1 refills | Status: DC

## 2017-01-04 MED ORDER — POLYETHYLENE GLYCOL 3350 17 GM/SCOOP PO POWD: ORAL | 3 refills | Status: DC

## 2017-01-04 NOTE — Progress Notes (Signed)
    Toni Miller     MRN: [id number]      DOB: 05-Jul-1961  HPI: Patient is in for annual physical exam. 1 month h/o lBP radiaiting to left thigh aggravated by bending the knee   PE: BP 128/70   Pulse 97   Resp 18   Ht 5\' 2"  (1.575 m)   Wt 126 lb 0.6 oz (57.2 kg)   SpO2 98%   BMI 23.05 kg/m   Pleasant  female, alert and oriented x 3, in no cardio-pulmonary distress. Afebrile. HEENT No facial trauma or asymetry. Sinuses non tender.  Extra occullar muscles intact, pupils equally reactive to light. External ears normal, tympanic membranes clear. Oropharynx moist, no exudate. Neck: supple, no adenopathy,JVD or thyromegaly.No bruits.  Chest: Clear to ascultation bilaterally.No crackles or wheezes. Non tender to palpation  Breast: No asymetry,no masses or lumps. No tenderness. No nipple discharge or inversion. No axillary or supraclavicular adenopathy  Cardiovascular system; Heart sounds normal,  S1 and  S2 ,no S3.  No murmur, or thrill. Apical beat not displaced Peripheral pulses normal.  Abdomen: Soft, non tender, no organomegaly or masses. No bruits. Bowel sounds normal. No guarding, tenderness or rebound.  Rectal:  Normal sphincter tone. No rectal mass. Guaiac negative stool.  GU: External genitalia normal female genitalia , normal female distribution of hair. No lesions. Urethral meatus normal in size, no  Prolapse, no lesions visible  Vitiligo of introitus. Bladder non tender. Vagina pink and moist , with no visible lesions , discharge present . Adequate pelvic support no  cystocele or rectocele noted Uterus absent, no adnexal masses, no  adnexal tenderness.   Musculoskeletal exam: Full ROM of spine, hips , shoulders and knees. No deformity ,swelling or crepitus noted. No muscle wasting or atrophy.   Neurologic: Cranial nerves 2 to 12 intact. Power, tone ,sensation and reflexes normal throughout. No disturbance in gait. No tremor.  Skin: Intact,  no ulceration, erythema , scaling or rash noted. Pigmentation normal throughout  Psych; Normal mood and affect. Judgement and concentration normal   Assessment & Plan:  Annual physical exam Annual exam as documented. Counseling done  re healthy lifestyle involving commitment to 150 minutes exercise per week, heart healthy diet, and attaining healthy weight.The importance of adequate sleep also discussed. Regular seat belt use and home safety, is also discussed. Changes in health habits are decided on by the patient with goals and time frames  set for achieving them. Immunization and cancer screening needs are specifically addressed at this visit.   BACK PAIN WITH RADICULOPATHY One month h/o increased symptoms, short course of prednisone orally, and X ray of lumbar spine  H/O abnormal Pap smear Pap sent

## 2017-01-04 NOTE — Assessment & Plan Note (Signed)

## 2017-01-04 NOTE — Patient Instructions (Addendum)
F/u end August , call if you need me sooner  Fasting lipid, cmp, CBC today  X ray of low back today  5 day course of prednisone sent for left thigh pain  Concern for you as you deal with your Grandmother's failing health  It is important that you exercise regularly at least 30 minutes 5 times a week. If you develop chest pain, have severe difficulty breathing, or feel very tired, stop exercising immediately and seek medical attention   Thank you  for choosing [facility]. We consider it a privelige to serve you.  Delivering excellent health care in a caring and  compassionate way is our goal.  Partnering with you,  so that together we can achieve this goal is our strategy.

## 2017-01-04 NOTE — Assessment & Plan Note (Addendum)
One month h/o increased symptoms, short course of prednisone orally, and X ray of lumbar spine

## 2017-01-05 LAB — COMPREHENSIVE METABOLIC PANEL
ALT: 16 U/L (ref 6–29)
AST: 22 U/L (ref 10–35)
Albumin: 4.5 g/dL (ref 3.6–5.1)
Alkaline Phosphatase: 51 U/L (ref 33–130)
BILIRUBIN TOTAL: 0.5 mg/dL (ref 0.2–1.2)
BUN: 13 mg/dL (ref 7–25)
CHLORIDE: 98 mmol/L (ref 98–110)
CO2: 26 mmol/L (ref 20–31)
CREATININE: 0.91 mg/dL (ref 0.50–1.05)
Calcium: 9.5 mg/dL (ref 8.6–10.4)
GLUCOSE: 82 mg/dL (ref 65–99)
Potassium: 3.8 mmol/L (ref 3.5–5.3)
SODIUM: 138 mmol/L (ref 135–146)
Total Protein: 7.4 g/dL (ref 6.1–8.1)

## 2017-01-05 LAB — LIPID PANEL
CHOL/HDL RATIO: 2 ratio (ref <5.0)
Cholesterol: 171 mg/dL (ref <200)
HDL: 86 mg/dL (ref >50)
LDL CALC: 71 mg/dL (ref <100)
Triglycerides: 70 mg/dL (ref <150)
VLDL: 14 mg/dL (ref <30)

## 2017-01-06 NOTE — Assessment & Plan Note (Signed)
Pap sent ?

## 2017-01-09 LAB — CYTOLOGY - PAP
Diagnosis: NEGATIVE
HPV: NOT DETECTED

## 2017-02-01 ENCOUNTER — Ambulatory Visit (INDEPENDENT_AMBULATORY_CARE_PROVIDER_SITE_OTHER): Payer: Medicare Other | Admitting: Family Medicine

## 2017-02-01 ENCOUNTER — Encounter: Payer: Self-pay | Admitting: Family Medicine

## 2017-02-01 VITALS — BP 110/74 | HR 80 | Temp 97.6°F | Resp 18 | Ht 62.0 in | Wt 123.1 lb

## 2017-02-01 DIAGNOSIS — J069 Acute upper respiratory infection, unspecified: Secondary | ICD-10-CM

## 2017-02-01 MED ORDER — PREDNISONE 20 MG PO TABS: 20.0000 mg | ORAL_TABLET | Freq: Two times a day (BID) | ORAL | 0 refills | Status: DC

## 2017-02-01 MED ORDER — BENZONATATE 200 MG PO CAPS: 200.0000 mg | ORAL_CAPSULE | Freq: Two times a day (BID) | ORAL | 0 refills | Status: DC | PRN

## 2017-02-01 MED ORDER — AMOXICILLIN-POT CLAVULANATE 875-125 MG PO TABS: 1.0000 | ORAL_TABLET | Freq: Two times a day (BID) | ORAL | 0 refills | Status: DC

## 2017-02-01 NOTE — Patient Instructions (Signed)
Push fluids Take the antibiotic and prednisone twice a day Use the tessalon for cough  Call if not improving by Monday

## 2017-02-01 NOTE — Progress Notes (Signed)
Chief Complaint  Patient presents with  . Cough    x 2 weeks   Cough and congestion, sore throat laryngitis fatigue for 10-14 days.  Producing yellow sputum.  Chills, but no documented fever.  Headache and ear pressure.  Not getting better with OTC meds.  Patient Active Problem List   Diagnosis Date Noted  . Annual physical exam 12/04/2015  . Arthritis of both knees 04/25/2015  . Internal hemorrhoids with complication 42/35/3614  . Osteopenia 04/02/2014  . Depression 01/06/2014  . H/O abnormal Pap smear 04/20/2013  . Thyroid nodule 08/16/2011  . Hot flashes, menopausal 06/12/2011  . Chronic constipation 06/01/2011  . BACK PAIN WITH RADICULOPATHY 12/29/2009  . Allergic rhinitis 05/14/2008  . Hyperlipidemia with target LDL less than 100 01/29/2008  . GLAUCOMA 01/29/2008  . Essential hypertension 01/29/2008  . GERD 01/29/2008    Outpatient Encounter Prescriptions as of 02/01/2017  Medication Sig  . calcium-vitamin D (OSCAL WITH D) 500-200 MG-UNIT per tablet Take 1 tablet by mouth 2 (two) times daily.  . cyclobenzaprine (FLEXERIL) 10 MG tablet Take 1 tablet (10 mg total) by mouth 3 (three) times daily as needed for muscle spasms.  Marland Kitchen docusate sodium (COLACE) 100 MG capsule Take 100 mg by mouth daily.  Marland Kitchen esomeprazole (NEXIUM) 40 MG capsule TAKE ONE CAPSULE BY MOUTH ONCE DAILY.  . fluticasone (FLONASE) 50 MCG/ACT nasal spray USE 2 SPRAYS IN EACH NOSTRIL ONCE DAILY.  Marland Kitchen gabapentin (NEURONTIN) 300 MG capsule Take 1 capsule (300 mg total) by mouth at bedtime.  . hydrOXYzine (VISTARIL) 25 MG capsule TAKE 1 CAPSULE BY MOUTH THREE TIMES DAILY AS NEEDED.  Marland Kitchen lubiprostone (AMITIZA) 8 MCG capsule Take 1 capsule (8 mcg total) by mouth 2 (two) times daily with a meal.  . mometasone (NASONEX) 50 MCG/ACT nasal spray Place 2 sprays into the nose daily.  . montelukast (SINGULAIR) 10 MG tablet TAKE 1 TABLET BY MOUTH ONCE DAILY.  . Multiple Vitamin (MULTIVITAMIN) capsule Take 1 capsule by mouth  daily.  . polyethylene glycol powder (GLYCOLAX/MIRALAX) powder MIX 1 CAPFUL (17G) IN 8 OUNCES OF JUICE/WATER AND DRINK ONCE DAILY.  Marland Kitchen potassium chloride (K-DUR) 10 MEQ tablet Take 1 tablet (10 mEq total) by mouth daily as needed. (Patient taking differently: Take 10 mEq by mouth daily as needed (low potassium). )  . rosuvastatin (CRESTOR) 10 MG tablet TAKE (1) TABLET BY MOUTH AT BEDTIME.  . TRAVATAN Z 0.004 % SOLN ophthalmic solution Place 1 drop into both eyes at bedtime.  . triamterene-hydrochlorothiazide (MAXZIDE-25) 37.5-25 MG tablet Take 1 tablet by mouth daily.  Marland Kitchen venlafaxine XR (EFFEXOR-XR) 75 MG 24 hr capsule TAKE ONE CAPSULE BY MOUTH DAILY WITH BREAKFAST.  Marland Kitchen amoxicillin-clavulanate (AUGMENTIN) 875-125 MG tablet Take 1 tablet by mouth 2 (two) times daily.  . benzonatate (TESSALON) 200 MG capsule Take 1 capsule (200 mg total) by mouth 2 (two) times daily as needed for cough.  . predniSONE (DELTASONE) 20 MG tablet Take 1 tablet (20 mg total) by mouth 2 (two) times daily with a meal.   No facility-administered encounter medications on file as of 02/01/2017.     Allergies  Allergen Reactions  . Acyclovir And Related Rash    Review of Systems  Constitutional: Positive for chills and fatigue.  HENT: Positive for congestion, ear pain, postnasal drip, rhinorrhea, sinus pressure, sore throat and voice change.   Eyes: Negative for redness and visual disturbance.  Respiratory: Positive for cough. Negative for shortness of breath and wheezing.   Cardiovascular:  Negative for chest pain and palpitations.  Gastrointestinal: Positive for nausea and vomiting. Negative for diarrhea.  Genitourinary: Negative for dysuria and frequency.  Musculoskeletal: Negative for arthralgias and myalgias.  Neurological: Positive for headaches. Negative for dizziness.  Hematological: Negative for adenopathy.    BP 110/74 (BP Location: Right Arm, Patient Position: Sitting, Cuff Size: Normal)   Pulse 80   Temp  97.6 F (36.4 C) (Temporal)   Resp 18   Ht 5\' 2"  (1.575 m)   Wt 123 lb 1.9 oz (55.8 kg)   SpO2 98%   BMI 22.52 kg/m   Physical Exam  Constitutional: She is oriented to person, place, and time. She appears well-developed and well-nourished.  HENT:  Head: Normocephalic and atraumatic.  Right Ear: External ear normal.  Left Ear: External ear normal.  Mouth/Throat: Oropharynx is clear and moist.  Tonsils large and red, no exudate.  Nasal membranes red and swollen  Eyes: Conjunctivae are normal. Pupils are equal, round, and reactive to light.  Neck: Normal range of motion. Neck supple. No thyromegaly present.  Cardiovascular: Normal rate, regular rhythm and normal heart sounds.   Pulmonary/Chest: Effort normal and breath sounds normal. No respiratory distress.  Abdominal: Soft. Bowel sounds are normal.  Musculoskeletal: Normal range of motion. She exhibits no edema.  Lymphadenopathy:    She has cervical adenopathy.  Neurological: She is alert and oriented to person, place, and time.  Gait normal  Skin: Skin is warm and dry.  Psychiatric: She has a normal mood and affect. Her behavior is normal. Thought content normal.  Nursing note and vitals reviewed.   ASSESSMENT/PLAN:  1. URI with cough and congestion Likely viral illness, but persistent symptoms warrant trial of antibiotics   Patient Instructions  Push fluids Take the antibiotic and prednisone twice a day Use the tessalon for cough  Call if not improving by Monday   Raylene Everts, MD

## 2017-02-08 ENCOUNTER — Other Ambulatory Visit: Payer: Self-pay | Admitting: Family Medicine

## 2017-03-16 ENCOUNTER — Other Ambulatory Visit: Payer: Self-pay | Admitting: Family Medicine

## 2017-03-21 ENCOUNTER — Telehealth: Payer: Self-pay | Admitting: Family Medicine

## 2017-03-21 NOTE — Telephone Encounter (Signed)
cb# [phone]  Patient is requesting a call back regarding her last conversation with Dr.Simpson, about updating a vaccine, she has no idea what vaccine.

## 2017-03-22 NOTE — Telephone Encounter (Signed)
pls explain next time a vaccine is due is in the  Fall and this is the flu vaccine

## 2017-03-22 NOTE — Telephone Encounter (Signed)
I don't see any vaccine that is due. What would you want me to relay to patient?

## 2017-03-22 NOTE — Telephone Encounter (Signed)
Left message for patient

## 2017-04-25 ENCOUNTER — Other Ambulatory Visit: Payer: Self-pay | Admitting: Family Medicine

## 2017-05-11 ENCOUNTER — Other Ambulatory Visit: Payer: Self-pay | Admitting: Family Medicine

## 2017-06-05 ENCOUNTER — Telehealth: Payer: Self-pay | Admitting: Family Medicine

## 2017-06-05 ENCOUNTER — Other Ambulatory Visit: Payer: Self-pay | Admitting: Family Medicine

## 2017-06-05 NOTE — Telephone Encounter (Signed)
Patient would like to get a Rx for Albuterol inhaler.  Patients next f/u is end of August.

## 2017-06-06 ENCOUNTER — Other Ambulatory Visit: Payer: Self-pay

## 2017-06-06 MED ORDER — ALBUTEROL SULFATE HFA 108 (90 BASE) MCG/ACT IN AERS: 2.0000 | INHALATION_SPRAY | Freq: Four times a day (QID) | RESPIRATORY_TRACT | 0 refills | Status: DC | PRN

## 2017-06-06 NOTE — Telephone Encounter (Signed)
Refill sent.

## 2017-06-06 NOTE — Telephone Encounter (Signed)
Requesting albuterol inhaler for as needed use. Last refill was in 2016. Ok to refill?

## 2017-06-06 NOTE — Telephone Encounter (Signed)
Refill x 1 please 

## 2017-06-13 ENCOUNTER — Other Ambulatory Visit: Payer: Self-pay | Admitting: Family Medicine

## 2017-06-15 ENCOUNTER — Other Ambulatory Visit: Payer: Self-pay

## 2017-06-15 MED ORDER — CYCLOBENZAPRINE HCL 10 MG PO TABS: 10.0000 mg | ORAL_TABLET | Freq: Three times a day (TID) | ORAL | 0 refills | Status: DC | PRN

## 2017-07-05 ENCOUNTER — Encounter: Payer: Self-pay | Admitting: Family Medicine

## 2017-07-05 ENCOUNTER — Ambulatory Visit (INDEPENDENT_AMBULATORY_CARE_PROVIDER_SITE_OTHER): Payer: Medicare Other | Admitting: Family Medicine

## 2017-07-05 VITALS — BP 110/74 | HR 86 | Resp 16 | Ht 62.0 in | Wt 129.0 lb

## 2017-07-05 DIAGNOSIS — Z1231 Encounter for screening mammogram for malignant neoplasm of breast: Secondary | ICD-10-CM | POA: Diagnosis not present

## 2017-07-05 DIAGNOSIS — N951 Menopausal and female climacteric states: Secondary | ICD-10-CM | POA: Diagnosis not present

## 2017-07-05 DIAGNOSIS — I10 Essential (primary) hypertension: Secondary | ICD-10-CM | POA: Diagnosis not present

## 2017-07-05 DIAGNOSIS — E785 Hyperlipidemia, unspecified: Secondary | ICD-10-CM | POA: Diagnosis not present

## 2017-07-05 MED ORDER — VENLAFAXINE HCL ER 150 MG PO CP24: 150.0000 mg | ORAL_CAPSULE | Freq: Every day | ORAL | 4 refills | Status: DC

## 2017-07-05 NOTE — Progress Notes (Signed)
   Toni Miller     MRN: [id number]      DOB: January 05, 1961   HPI Ms. Stailey is here for follow up and re-evaluation of chronic medical conditions, medication management and review of any available recent lab and radiology data.  Preventive health is updated, specifically  Cancer screening and Immunization.   Questions or concerns regarding consultations or procedures which the PT has had in the interim are  addressed. The PT denies any adverse reactions to current medications since the last visit.  Left thigh pain and buttock pain  x 1 month rated at a 4, fell in [employer] 4 months ago Hot flashes  Red eyes ROS Denies recent fever or chills. Denies sinus pressure, nasal congestion, ear pain or sore throat. Denies chest congestion, productive cough or wheezing. Denies chest pains, palpitations and leg swelling Denies abdominal pain, nausea, vomiting,diarrhea or constipation.   Denies dysuria, frequency, hesitancy or incontinence.  Denies headaches, seizures, numbness, or tingling. Denies depression, anxiety or insomnia. Denies skin break down or rash.   PE  BP 110/74   Pulse 86   Resp 16   Ht 5\' 2"  (1.575 m)   Wt 129 lb (58.5 kg)   SpO2 96%   BMI 23.59 kg/m   Patient alert and oriented and in no cardiopulmonary distress.  HEENT: No facial asymmetry, EOMI,   oropharynx pink and moist.  Neck supple no JVD, no mass.  Chest: Clear to auscultation bilaterally.  CVS: S1, S2 no murmurs, no S3.Regular rate.  ABD: Soft non tender.   Ext: No edema  MS: Adequate ROM spine, shoulders, hips and knees.  Skin: Intact, no ulcerations or rash noted.  Psych: Good eye contact, normal affect. Memory intact not anxious or depressed appearing.  CNS: CN 2-12 intact, power,  normal throughout.no focal deficits noted.   Assessment & Plan  Hot flashes, menopausal Uncontrolled , some improvement but not at goal, reviewed lifestle modification and use of medication for  management  Essential hypertension Controlled, no change in medication DASH diet and commitment to daily physical activity for a minimum of 30 minutes discussed and encouraged, as a part of hypertension management. The importance of attaining a healthy weight is also discussed.  BP/Weight 07/05/2017 02/01/2017 01/04/2017 10/18/2016 09/15/2016 09/08/2016 6/96/2952  Systolic BP 841 324 401 027 253 664 403  Diastolic BP 74 74 70 80 80 86 84  Wt. (Lbs) 129 123.12 126.04 127.12 125 120 114.12  BMI 23.59 22.52 23.05 23.25 22.86 22.67 20.87       Hyperlipidemia with target LDL less than 100 Hyperlipidemia:Low fat diet discussed and encouraged.   Lipid Panel  Lab Results  Component Value Date   CHOL 176 07/05/2017   HDL 74 07/05/2017   LDLCALC 83 07/05/2017   TRIG 97 07/05/2017   CHOLHDL 2.4 07/05/2017    Controlled, no change in medication    BACK PAIN WITH RADICULOPATHY Increased since fal;l 4 months ago, possibly,  However , apparently improving , no desire to seek specific medical attentions at this time

## 2017-07-05 NOTE — Assessment & Plan Note (Addendum)
Uncontrolled , some improvement but not at goal, reviewed lifestle modification and use of medication for management

## 2017-07-05 NOTE — Patient Instructions (Addendum)
Wellness visit in December as before  MD follow up in 5 months please schedule  Mammogram due in October we will schedule and call with appt, morning appt er your request(msg on answering machine)   Please call for ortho evaluation re left thigh pain if you decide on this, no new medication or imaging today   Fasting lipid, cmp and EGFr today and TSH  Flu vaccine today  Thank you  for choosing [facility]. We consider it a privelige to serve you.  Delivering excellent health care in a caring and  compassionate way is our goal.  Partnering with you,  so that together we can achieve this goal is our strategy.   Increase in effexor dose for hot flashes

## 2017-07-06 ENCOUNTER — Encounter: Payer: Self-pay | Admitting: Family Medicine

## 2017-07-06 LAB — LIPID PANEL
Cholesterol: 176 mg/dL (ref <200)
HDL: 74 mg/dL (ref >50)
LDL Cholesterol: 83 mg/dL (ref <100)
Total CHOL/HDL Ratio: 2.4 Ratio (ref <5.0)
Triglycerides: 97 mg/dL (ref <150)
VLDL: 19 mg/dL (ref <30)

## 2017-07-06 LAB — COMPLETE METABOLIC PANEL WITH GFR
ALBUMIN: 4.5 g/dL (ref 3.6–5.1)
ALK PHOS: 70 U/L (ref 33–130)
ALT: 30 U/L — AB (ref 6–29)
AST: 29 U/L (ref 10–35)
BILIRUBIN TOTAL: 0.4 mg/dL (ref 0.2–1.2)
BUN: 17 mg/dL (ref 7–25)
CO2: 26 mmol/L (ref 20–32)
CREATININE: 0.85 mg/dL (ref 0.50–1.05)
Calcium: 9.4 mg/dL (ref 8.6–10.4)
Chloride: 100 mmol/L (ref 98–110)
GFR, Est African American: >89 mL/min (ref >=60)
GFR, Est Non African American: 78 mL/min (ref >=60)
GLUCOSE: 77 mg/dL (ref 65–99)
POTASSIUM: 3.8 mmol/L (ref 3.5–5.3)
SODIUM: 140 mmol/L (ref 135–146)
TOTAL PROTEIN: 7.5 g/dL (ref 6.1–8.1)

## 2017-07-06 LAB — TSH: TSH: 1.56 mIU/L

## 2017-07-09 NOTE — Assessment & Plan Note (Signed)
Hyperlipidemia:Low fat diet discussed and encouraged.   Lipid Panel  Lab Results  Component Value Date   CHOL 176 07/05/2017   HDL 74 07/05/2017   LDLCALC 83 07/05/2017   TRIG 97 07/05/2017   CHOLHDL 2.4 07/05/2017    Controlled, no change in medication

## 2017-07-09 NOTE — Assessment & Plan Note (Signed)
Controlled, no change in medication DASH diet and commitment to daily physical activity for a minimum of 30 minutes discussed and encouraged, as a part of hypertension management. The importance of attaining a healthy weight is also discussed.  BP/Weight 07/05/2017 02/01/2017 01/04/2017 10/18/2016 09/15/2016 09/08/2016 8/55/0158  Systolic BP 682 574 935 521 747 159 539  Diastolic BP 74 74 70 80 80 86 84  Wt. (Lbs) 129 123.12 126.04 127.12 125 120 114.12  BMI 23.59 22.52 23.05 23.25 22.86 22.67 20.87

## 2017-07-09 NOTE — Assessment & Plan Note (Signed)
Increased since fal;l 4 months ago, possibly,  However , apparently improving , no desire to seek specific medical attentions at this time

## 2017-07-19 ENCOUNTER — Other Ambulatory Visit: Payer: Self-pay | Admitting: Family Medicine

## 2017-07-19 NOTE — Telephone Encounter (Signed)
Seen [phone] 

## 2017-07-21 ENCOUNTER — Ambulatory Visit (HOSPITAL_COMMUNITY): Payer: Self-pay

## 2017-07-24 ENCOUNTER — Encounter (HOSPITAL_COMMUNITY): Payer: Self-pay

## 2017-07-24 ENCOUNTER — Ambulatory Visit (HOSPITAL_COMMUNITY)
Admission: RE | Admit: 2017-07-24 | Discharge: 2017-07-24 | Disposition: A | Discharge status: Home / Self Care | Payer: Medicare Other | Source: Ambulatory Visit | Attending: Family Medicine | Admitting: Family Medicine

## 2017-07-24 DIAGNOSIS — Z1231 Encounter for screening mammogram for malignant neoplasm of breast: Secondary | ICD-10-CM | POA: Diagnosis not present

## 2017-08-01 ENCOUNTER — Telehealth: Payer: Self-pay

## 2017-08-01 NOTE — Telephone Encounter (Signed)
Called pt to schedule Medicare Annual Wellness Visit. -nr  

## 2017-08-02 ENCOUNTER — Ambulatory Visit (INDEPENDENT_AMBULATORY_CARE_PROVIDER_SITE_OTHER): Payer: Medicare Other

## 2017-08-02 VITALS — BP 118/80 | HR 82 | Temp 97.7°F | Ht 62.0 in | Wt 129.1 lb

## 2017-08-02 DIAGNOSIS — Z0000 Encounter for general adult medical examination without abnormal findings: Secondary | ICD-10-CM

## 2017-08-02 NOTE — Patient Instructions (Signed)
Toni Miller , Thank you for taking time to come for your Medicare Wellness Visit. I appreciate your ongoing commitment to your health goals. Please review the following plan we discussed and let me know if I can assist you in the future.   Screening recommendations/referrals: Colonoscopy: Up to date, next due 05/2023 Mammogram: Up to date, next due 07/2018 Bone Density: Due at age 54 Recommended yearly ophthalmology/optometry visit for glaucoma screening and checkup Recommended yearly dental visit for hygiene and checkup  Vaccinations: Influenza vaccine: Up to date Pneumococcal vaccine: Due at age 29 Tdap vaccine: Up to date, next due 02/2025  Shingles vaccine: Due, declines today    Advanced directives: Advance directive discussed with you today. I have provided a copy for you to complete at home and have notarized. Once this is complete please bring a copy in to our office so we can scan it into your chart.  Next appointment: Follow up with Dr. Moshe Cipro on 08/07/2017 at 1:00 pm, and on 12/05/2017 at 11:00am. Follow up in 1 year for your annual wellness visit.   Preventive Care 40-64 Years, Female Preventive care refers to lifestyle choices and visits with your health care provider that can promote health and wellness. What does preventive care include?  A yearly physical exam. This is also called an annual well check.  Dental exams once or twice a year.  Routine eye exams. Ask your health care provider how often you should have your eyes checked.  Personal lifestyle choices, including:  Daily care of your teeth and gums.  Regular physical activity.  Eating a healthy diet.  Avoiding tobacco and drug use.  Limiting alcohol use.  Practicing safe sex.  Taking low-dose aspirin daily starting at age 16.  Taking vitamin and mineral supplements as recommended by your health care provider. What happens during an annual well check? The services and screenings done by your health care  provider during your annual well check will depend on your age, overall health, lifestyle risk factors, and family history of disease. Counseling  Your health care provider may ask you questions about your:  Alcohol use.  Tobacco use.  Drug use.  Emotional well-being.  Home and relationship well-being.  Sexual activity.  Eating habits.  Work and work Statistician.  Method of birth control.  Menstrual cycle.  Pregnancy history. Screening  You may have the following tests or measurements:  Height, weight, and BMI.  Blood pressure.  Lipid and cholesterol levels. These may be checked every 5 years, or more frequently if you are over 39 years old.  Skin check.  Lung cancer screening. You may have this screening every year starting at age 4 if you have a 30-pack-year history of smoking and currently smoke or have quit within the past 15 years.  Fecal occult blood test (FOBT) of the stool. You may have this test every year starting at age 37.  Flexible sigmoidoscopy or colonoscopy. You may have a sigmoidoscopy every 5 years or a colonoscopy every 10 years starting at age 35.  Hepatitis C blood test.  Hepatitis B blood test.  Sexually transmitted disease (STD) testing.  Diabetes screening. This is done by checking your blood sugar (glucose) after you have not eaten for a while (fasting). You may have this done every 1-3 years.  Mammogram. This may be done every 1-2 years. Talk to your health care provider about when you should start having regular mammograms. This may depend on whether you have a family history of breast  cancer.  BRCA-related cancer screening. This may be done if you have a family history of breast, ovarian, tubal, or peritoneal cancers.  Pelvic exam and Pap test. This may be done every 3 years starting at age 7. Starting at age 82, this may be done every 5 years if you have a Pap test in combination with an HPV test.  Bone density scan. This is done  to screen for osteoporosis. You may have this scan if you are at high risk for osteoporosis. Discuss your test results, treatment options, and if necessary, the need for more tests with your health care provider. Vaccines  Your health care provider may recommend certain vaccines, such as:  Influenza vaccine. This is recommended every year.  Tetanus, diphtheria, and acellular pertussis (Tdap, Td) vaccine. You may need a Td booster every 10 years.  Zoster vaccine. You may need this after age 45.  Pneumococcal 13-valent conjugate (PCV13) vaccine. You may need this if you have certain conditions and were not previously vaccinated.  Pneumococcal polysaccharide (PPSV23) vaccine. You may need one or two doses if you smoke cigarettes or if you have certain conditions. Talk to your health care provider about which screenings and vaccines you need and how often you need them. This information is not intended to replace advice given to you by your health care provider. Make sure you discuss any questions you have with your health care provider. Document Released: 11/20/2015 Document Revised: 07/13/2016 Document Reviewed: 08/25/2015 Elsevier Interactive Patient Education  2017 [place] Prevention in the Home Falls can cause injuries. They can happen to people of all ages. There are many things you can do to make your home safe and to help prevent falls. What can I do on the outside of my home?  Regularly fix the edges of walkways and driveways and fix any cracks.  Remove anything that might make you trip as you walk through a door, such as a raised step or threshold.  Trim any bushes or trees on the path to your home.  Use bright outdoor lighting.  Clear any walking paths of anything that might make someone trip, such as rocks or tools.  Regularly check to see if handrails are loose or broken. Make sure that both sides of any steps have handrails.  Any raised decks and porches  should have guardrails on the edges.  Have any leaves, snow, or ice cleared regularly.  Use sand or salt on walking paths during winter.  Clean up any spills in your garage right away. This includes oil or grease spills. What can I do in the bathroom?  Use night lights.  Install grab bars by the toilet and in the tub and shower. Do not use towel bars as grab bars.  Use non-skid mats or decals in the tub or shower.  If you need to sit down in the shower, use a plastic, non-slip stool.  Keep the floor dry. Clean up any water that spills on the floor as soon as it happens.  Remove soap buildup in the tub or shower regularly.  Attach bath mats securely with double-sided non-slip rug tape.  Do not have throw rugs and other things on the floor that can make you trip. What can I do in the bedroom?  Use night lights.  Make sure that you have a light by your bed that is easy to reach.  Do not use any sheets or blankets that are too big for your  bed. They should not hang down onto the floor.  Have a firm chair that has side arms. You can use this for support while you get dressed.  Do not have throw rugs and other things on the floor that can make you trip. What can I do in the kitchen?  Clean up any spills right away.  Avoid walking on wet floors.  Keep items that you use a lot in easy-to-reach places.  If you need to reach something above you, use a strong step stool that has a grab bar.  Keep electrical cords out of the way.  Do not use floor polish or wax that makes floors slippery. If you must use wax, use non-skid floor wax.  Do not have throw rugs and other things on the floor that can make you trip. What can I do with my stairs?  Do not leave any items on the stairs.  Make sure that there are handrails on both sides of the stairs and use them. Fix handrails that are broken or loose. Make sure that handrails are as long as the stairways.  Check any carpeting to  make sure that it is firmly attached to the stairs. Fix any carpet that is loose or worn.  Avoid having throw rugs at the top or bottom of the stairs. If you do have throw rugs, attach them to the floor with carpet tape.  Make sure that you have a light switch at the top of the stairs and the bottom of the stairs. If you do not have them, ask someone to add them for you. What else can I do to help prevent falls?  Wear shoes that:  Do not have high heels.  Have rubber bottoms.  Are comfortable and fit you well.  Are closed at the toe. Do not wear sandals.  If you use a stepladder:  Make sure that it is fully opened. Do not climb a closed stepladder.  Make sure that both sides of the stepladder are locked into place.  Ask someone to hold it for you, if possible.  Clearly mark and make sure that you can see:  Any grab bars or handrails.  First and last steps.  Where the edge of each step is.  Use tools that help you move around (mobility aids) if they are needed. These include:  Canes.  Walkers.  Scooters.  Crutches.  Turn on the lights when you go into a dark area. Replace any light bulbs as soon as they burn out.  Set up your furniture so you have a clear path. Avoid moving your furniture around.  If any of your floors are uneven, fix them.  If there are any pets around you, be aware of where they are.  Review your medicines with your doctor. Some medicines can make you feel dizzy. This can increase your chance of falling. Ask your doctor what other things that you can do to help prevent falls. This information is not intended to replace advice given to you by your health care provider. Make sure you discuss any questions you have with your health care provider. Document Released: 08/20/2009 Document Revised: 03/31/2016 Document Reviewed: 11/28/2014 Elsevier Interactive Patient Education  2017 [employer].

## 2017-08-02 NOTE — Progress Notes (Signed)
Subjective:   Toni Miller is a 56 y.o. female who presents for Medicare Annual (Subsequent) preventive examination.  Review of Systems:  Cardiac Risk Factors include: hypertension;dyslipidemia;sedentary lifestyle     Objective:     Vitals: BP 118/80   Pulse 82   Temp 97.7 F (36.5 C) (Temporal)   Ht 5\' 2"  (1.575 m)   Wt 129 lb 1.3 oz (58.6 kg)   BMI 23.61 kg/m   Body mass index is 23.61 kg/m.   Tobacco History  Smoking Status  . Former Smoker  . Packs/day: 3.00  . Years: 2.00  . Types: Cigarettes  . Quit date: 05/12/1991  Smokeless Tobacco  . Never Used     Counseling given: Not Answered   Past Medical History:  Diagnosis Date  . Arthritis   . Cancer ([place]) 1993   abnormal pap treated at [facility]  . Constipation   . GERD (gastroesophageal reflux disease)   . Glaucoma 1993  . Hyperlipidemia   . Hypertension   . Neck pain 7/09   WITH BULGING DISC-----RECIEVING EPIDURALS   . Sinusitis    Past Surgical History:  Procedure Laterality Date  . ABDOMINAL EXPLORATION SURGERY  age 63   bowel obstruction, [facility]  . ABDOMINAL HYSTERECTOMY  2007   [facility], [facility]  . BILATERAL EYE SURGERY     for glaucoma  . BREAST BIOPSY Left    benign  . BREAST SURGERY Left 2004   left partial mastectomy, [facility]  . CATARACT EXTRACTION Right 06/04/2015  . CATARACT EXTRACTION W/PHACO  05/16/2011   Procedure: CATARACT EXTRACTION PHACO AND INTRAOCULAR LENS PLACEMENT (IOC);  Surgeon: Tonny Branch;  Location: [facility];  Service: Ophthalmology;  Laterality: Right;  . COLONOSCOPY N/A 05/17/2013   Dr. Barnie Alderman diverticulosis was noted/small internal hemorrhoids  . CYST REMOVED LEFT BREAST /BENIGN  2005   left, [facility]  . EYE SURGERY  2010, 1992 approx   bilateral  . REPAIR IMPERFORATE ANUS / ANORECTOPLASTY     per patient  . TOTAL ABDOMINAL HYSTERECTOMY W/ BILATERAL SALPINGOOPHORECTOMY  2007   [facility], [facility]  . TUBAL LIGATION  1991   Family History  Problem Relation Age of Onset  . Adopted: Yes  .  Alcohol abuse Mother   . Stomach cancer Mother 89  . Lung cancer Father   . Glaucoma Father   . Alcohol abuse Father   . Breast cancer Sister 62  . Breast cancer Sister 53  . Heart disease Sister   . Heart attack Sister   . Glaucoma Son   . Glaucoma Maternal Grandfather   . Cancer Maternal Grandfather        poss leukemia  . SIDS Brother   . Hyperthyroidism Daughter   . Seizures Son        as a Sport and exercise psychologist  . Kidney disease Maternal Grandmother   . Heart disease Maternal Grandmother        Psychologist, forensic  . Leukemia Maternal Grandmother   . Colon cancer Neg Hx    History  Sexual Activity  . Sexual activity: Yes  . Birth control/ protection: Surgical    Outpatient Encounter Prescriptions as of 08/02/2017  Medication Sig  . acetaminophen (TYLENOL) 325 MG tablet Take 650 mg by mouth every 6 (six) hours as needed.  Marland Kitchen albuterol (PROAIR HFA) 108 (90 Base) MCG/ACT inhaler Inhale 2 puffs into the lungs every 6 (six) hours as needed for wheezing or shortness of breath.  . cyclobenzaprine (FLEXERIL) 10 MG tablet Take 1 tablet (  10 mg total) by mouth 3 (three) times daily as needed for muscle spasms.  . cycloSPORINE (RESTASIS) 0.05 % ophthalmic emulsion Place 1 drop into both eyes 2 (two) times daily.  Marland Kitchen docusate sodium (COLACE) 100 MG capsule Take 100 mg by mouth daily.  Marland Kitchen esomeprazole (NEXIUM) 40 MG capsule TAKE ONE CAPSULE BY MOUTH ONCE DAILY.  . fluticasone (FLONASE) 50 MCG/ACT nasal spray USE 2 SPRAYS IN EACH NOSTRIL ONCE DAILY. (Patient taking differently: USE 2 SPRAYS IN EACH NOSTRIL ONCE DAILY PRN.)  . gabapentin (NEURONTIN) 300 MG capsule Take 1 capsule (300 mg total) by mouth at bedtime.  . hydrOXYzine (VISTARIL) 25 MG capsule TAKE 1 CAPSULE BY MOUTH THREE TIMES DAILY AS NEEDED.  Marland Kitchen montelukast (SINGULAIR) 10 MG tablet TAKE 1 TABLET BY MOUTH ONCE DAILY.  . Multiple Vitamin (MULTIVITAMIN) capsule Take 1 capsule by mouth daily.  . polyethylene glycol powder (GLYCOLAX/MIRALAX) powder MIX 1  CAPFUL (17G) IN 8 OUNCES OF JUICE/WATER AND DRINK ONCE DAILY.  . rosuvastatin (CRESTOR) 10 MG tablet TAKE (1) TABLET BY MOUTH AT BEDTIME.  . TRAVATAN Z 0.004 % SOLN ophthalmic solution Place 1 drop into both eyes at bedtime.  . triamterene-hydrochlorothiazide (MAXZIDE-25) 37.5-25 MG tablet TAKE 1 TABLET BY MOUTH DAILY.  Marland Kitchen venlafaxine XR (EFFEXOR-XR) 150 MG 24 hr capsule Take 1 capsule (150 mg total) by mouth daily with breakfast.  . calcium-vitamin D (OSCAL WITH D) 500-200 MG-UNIT per tablet Take 1 tablet by mouth 2 (two) times daily.  . [DISCONTINUED] lubiprostone (AMITIZA) 8 MCG capsule Take 1 capsule (8 mcg total) by mouth 2 (two) times daily with a meal.  . [DISCONTINUED] mometasone (NASONEX) 50 MCG/ACT nasal spray Place 2 sprays into the nose daily.  . [DISCONTINUED] potassium chloride (K-DUR) 10 MEQ tablet Take 1 tablet (10 mEq total) by mouth daily as needed. (Patient taking differently: Take 10 mEq by mouth daily as needed (low potassium). )  . [DISCONTINUED] PROAIR HFA 108 (90 Base) MCG/ACT inhaler INHALE 2 PUFFS BY MOUTH EVERY 6 HOURS AS NEEDED FOR SHORTNESS OF BREATH.   No facility-administered encounter medications on file as of 08/02/2017.     Activities of Daily Living In your present state of health, do you have any difficulty performing the following activities: 08/02/2017 02/01/2017  Hearing? N N  Vision? N N  Difficulty concentrating or making decisions? N N  Walking or climbing stairs? N N  Dressing or bathing? N N  Doing errands, shopping? Y N  Comment patient no longer drives  -  Conservation officer, nature and eating ? N -  Using the Toilet? N -  In the past six months, have you accidently leaked urine? Y -  Comment patient has follow up appointment with Dr. Moshe Cipro on 08/07/2017 -  Do you have problems with loss of bowel control? N -  Managing your Medications? N -  Managing your Finances? N -  Housekeeping or managing your Housekeeping? N -  Some recent data might be hidden     Patient Care Team: [name], MD as PCP - General Danie Binder, MD as Attending Physician (Gastroenterology) Florian Buff, MD as Consulting Physician (Obstetrics and Gynecology)    Assessment:    Exercise Activities and Dietary recommendations Current Exercise Habits: The patient does not participate in regular exercise at present, Exercise limited by: None identified  Goals    . Exercise 3x per week (30 min per time)          Recommend starting a routine exercise  program at least 3 days a week for 30-45 minutes at a time as tolerated.        Fall Risk Fall Risk  08/02/2017 02/01/2017 10/18/2016 07/28/2015 06/23/2014  Falls in the past year? No No No No No   Depression Screen PHQ 2/9 Scores 08/02/2017 02/01/2017 10/18/2016 11/30/2015  PHQ - 2 Score 0 0 0 0  PHQ- 9 Score 0 - - -     Cognitive Function: Normal  6CIT Screen 08/02/2017  What Year? 0 points  What month? 0 points  What time? 0 points  Count back from 20 0 points  Months in reverse 0 points  Repeat phrase 0 points  Total Score 0    Immunization History  Administered Date(s) Administered  . Influenza Split 07/27/2011, 07/26/2012, 08/16/2016  . Influenza Whole 01/08/2008, 08/07/2008  . Influenza,inj,Quad PF,6+ Mos 08/21/2013, 08/11/2014, 07/28/2015  . Td 05/25/2004  . Tdap 02/24/2015   Screening Tests Health Maintenance  Topic Date Due  . MAMMOGRAM  07/25/2019  . PAP SMEAR  01/05/2020  . COLONOSCOPY  05/18/2023  . TETANUS/TDAP  02/23/2025  . INFLUENZA VACCINE  Completed  . Hepatitis C Screening  Completed  . HIV Screening  Completed      Plan:   I have personally reviewed and noted the following in the patient's chart:   . Medical and social history . Use of alcohol, tobacco or illicit drugs  . Current medications and supplements . Functional ability and status . Nutritional status . Physical activity . Advanced directives . List of other physicians . Hospitalizations,  surgeries, and ER visits in previous 12 months . Vitals . Screenings to include cognitive, depression, and falls . Referrals and appointments  In addition, I have reviewed and discussed with patient certain preventive protocols, quality metrics, and best practice recommendations. A written personalized care plan for preventive services as well as general preventive health recommendations were provided to patient.     Stormy Fabian, LPN  11/25/1476

## 2017-08-07 ENCOUNTER — Ambulatory Visit (INDEPENDENT_AMBULATORY_CARE_PROVIDER_SITE_OTHER): Payer: Medicare Other | Admitting: Family Medicine

## 2017-08-07 ENCOUNTER — Encounter: Payer: Self-pay | Admitting: Family Medicine

## 2017-08-07 VITALS — BP 110/70 | HR 81 | Temp 98.7°F | Resp 16 | Ht 62.0 in | Wt 129.8 lb

## 2017-08-07 DIAGNOSIS — I10 Essential (primary) hypertension: Secondary | ICD-10-CM | POA: Diagnosis not present

## 2017-08-07 DIAGNOSIS — R350 Frequency of micturition: Secondary | ICD-10-CM | POA: Diagnosis not present

## 2017-08-07 DIAGNOSIS — N3000 Acute cystitis without hematuria: Secondary | ICD-10-CM | POA: Insufficient documentation

## 2017-08-07 LAB — POCT URINALYSIS DIPSTICK
BILIRUBIN UA: NEGATIVE
GLUCOSE UA: NEGATIVE
Ketones, UA: NEGATIVE
NITRITE UA: NEGATIVE
Protein, UA: NEGATIVE
Spec Grav, UA: 1.01 (ref 1.010–1.025)
UROBILINOGEN UA: 0.2 U/dL
pH, UA: 7.5 (ref 5.0–8.0)

## 2017-08-07 MED ORDER — CIPROFLOXACIN HCL 500 MG PO TABS: 500.0000 mg | ORAL_TABLET | Freq: Two times a day (BID) | ORAL | 0 refills | Status: DC

## 2017-08-07 NOTE — Patient Instructions (Addendum)
F/u as before, call if you need me sooner  You are being treated for a urinary tract infection and a 3 day course of antibiotic is sent to your pharmacy. Please ensure you drink 64 ounces water daily

## 2017-08-07 NOTE — Assessment & Plan Note (Signed)
Controlled, no change in medication DASH diet and commitment to daily physical activity for a minimum of 30 minutes discussed and encouraged, as a part of hypertension management. The importance of attaining a healthy weight is also discussed.  BP/Weight 08/07/2017 08/02/2017 07/05/2017 02/01/2017 01/04/2017 10/18/2016 15/01/7942  Systolic BP 276 147 092 957 473 403 709  Diastolic BP 70 80 74 74 70 80 80  Wt. (Lbs) 129.75 129.08 129 123.12 126.04 127.12 125  BMI 23.73 23.61 23.59 22.52 23.05 23.25 22.86

## 2017-08-07 NOTE — Assessment & Plan Note (Signed)
Will test uA in office , will treat if abn, and send for c/s

## 2017-08-07 NOTE — Addendum Note (Signed)
Addended by: Merceda Elks on: 08/07/2017 04:08 PM   Modules accepted: Orders

## 2017-08-07 NOTE — Progress Notes (Signed)
   Toni Miller     MRN: [id number]      DOB: 1961-10-07   HPI Toni Miller is here with a 3 day h/o frequency and incontinence, recently sexually activve, denies fever, chills, renal angle tenderness or siprapubic pain   ROS See HPI  Denies recent fever or chills. Denies sinus pressure, nasal congestion, ear pain or sore throat. Denies chest congestion, productive cough or wheezing. Denies chest pains, palpitations and leg swelling Denies abdominal pain, nausea, vomiting,diarrhea or constipation.    Denies uncontrolled  depression, anxiety or insomnia. Denies skin break down or rash.   PE  BP 110/70 (BP Location: Left Arm, Patient Position: Sitting, Cuff Size: Normal)   Pulse 81   Temp 98.7 F (37.1 C) (Other (Comment))   Resp 16   Ht 5\' 2"  (1.575 m)   Wt 129 lb 12 oz (58.9 kg)   SpO2 97%   BMI 23.73 kg/m   Patient alert and oriented and in no cardiopulmonary distress.  HEENT: No facial asymmetry, EOMI,   oropharynx pink and moist.  Neck supple no JVD, no mass.  Chest: Clear to auscultation bilaterally.  CVS: S1, S2 no murmurs, no S3.Regular rate.  ABD: Soft non tender. No renal angle or suprapubic tenderness  Ext: No edema  MS: Adequate ROM spine, shoulders, hips and knees.  Skin: Intact, no ulcerations or rash noted.  Psych: Good eye contact, normal affect. Memory intact not anxious or depressed appearing.  CNS: CN 2-12 intact, power,  normal throughout.no focal deficits noted.   Assessment & Plan  Urinary frequency Will test uA in office , will treat if abn, and send for c/s  Essential hypertension Controlled, no change in medication DASH diet and commitment to daily physical activity for a minimum of 30 minutes discussed and encouraged, as a part of hypertension management. The importance of attaining a healthy weight is also discussed.  BP/Weight 08/07/2017 08/02/2017 07/05/2017 02/01/2017 01/04/2017 10/18/2016 60/05/3709  Systolic BP 626 948 546 270  350 093 818  Diastolic BP 70 80 74 74 70 80 80  Wt. (Lbs) 129.75 129.08 129 123.12 126.04 127.12 125  BMI 23.73 23.61 23.59 22.52 23.05 23.25 22.86

## 2017-08-09 LAB — URINE CULTURE
MICRO NUMBER:: 81086368
SPECIMEN QUALITY:: ADEQUATE

## 2017-08-09 NOTE — Telephone Encounter (Signed)
Issue resolved.

## 2017-08-11 ENCOUNTER — Other Ambulatory Visit: Payer: Self-pay | Admitting: Family Medicine

## 2017-08-11 NOTE — Telephone Encounter (Signed)
Seen 10 1 18

## 2017-08-14 DIAGNOSIS — H401131 Primary open-angle glaucoma, bilateral, mild stage: Secondary | ICD-10-CM | POA: Diagnosis not present

## 2017-08-21 ENCOUNTER — Other Ambulatory Visit: Payer: Self-pay | Admitting: Family Medicine

## 2017-08-21 NOTE — Telephone Encounter (Signed)
Seen 10 1 18

## 2017-09-25 ENCOUNTER — Other Ambulatory Visit: Payer: Self-pay | Admitting: Family Medicine

## 2017-10-04 ENCOUNTER — Ambulatory Visit: Payer: Self-pay

## 2017-10-06 ENCOUNTER — Ambulatory Visit: Payer: Medicare Other | Admitting: Obstetrics & Gynecology

## 2017-10-09 ENCOUNTER — Ambulatory Visit: Payer: Self-pay

## 2017-11-28 ENCOUNTER — Other Ambulatory Visit: Payer: Self-pay | Admitting: Family Medicine

## 2017-12-05 ENCOUNTER — Ambulatory Visit: Payer: Self-pay | Admitting: Family Medicine

## 2017-12-08 ENCOUNTER — Other Ambulatory Visit: Payer: Self-pay | Admitting: Family Medicine

## 2018-01-11 ENCOUNTER — Encounter: Payer: Self-pay | Admitting: Family Medicine

## 2018-01-11 ENCOUNTER — Ambulatory Visit (INDEPENDENT_AMBULATORY_CARE_PROVIDER_SITE_OTHER): Payer: Medicare Other | Admitting: Family Medicine

## 2018-01-11 ENCOUNTER — Telehealth: Payer: Self-pay | Admitting: Family Medicine

## 2018-01-11 VITALS — BP 118/72 | HR 84 | Resp 16 | Ht 62.0 in | Wt 129.0 lb

## 2018-01-11 DIAGNOSIS — I10 Essential (primary) hypertension: Secondary | ICD-10-CM

## 2018-01-11 DIAGNOSIS — E785 Hyperlipidemia, unspecified: Secondary | ICD-10-CM | POA: Diagnosis not present

## 2018-01-11 DIAGNOSIS — F321 Major depressive disorder, single episode, moderate: Secondary | ICD-10-CM | POA: Diagnosis not present

## 2018-01-11 DIAGNOSIS — M542 Cervicalgia: Secondary | ICD-10-CM | POA: Diagnosis not present

## 2018-01-11 DIAGNOSIS — E559 Vitamin D deficiency, unspecified: Secondary | ICD-10-CM | POA: Diagnosis not present

## 2018-01-11 MED ORDER — KETOROLAC TROMETHAMINE 60 MG/2ML IM SOLN
60.0000 mg | Freq: Once | INTRAMUSCULAR | Status: AC
  Administered 2018-01-11: 60 mg via INTRAMUSCULAR

## 2018-01-11 MED ORDER — TIZANIDINE HCL 2 MG PO CAPS: ORAL_CAPSULE | ORAL | 0 refills | Status: DC

## 2018-01-11 MED ORDER — MONTELUKAST SODIUM 10 MG PO TABS: 10.0000 mg | ORAL_TABLET | Freq: Every day | ORAL | 1 refills | Status: DC

## 2018-01-11 MED ORDER — IBUPROFEN 800 MG PO TABS: 800.0000 mg | ORAL_TABLET | Freq: Three times a day (TID) | ORAL | 0 refills | Status: DC | PRN

## 2018-01-11 MED ORDER — FLUTICASONE PROPIONATE 50 MCG/ACT NA SUSP: 1.0000 | Freq: Every day | NASAL | 6 refills | Status: DC

## 2018-01-11 MED ORDER — METHYLPREDNISOLONE ACETATE 80 MG/ML IJ SUSP
80.0000 mg | Freq: Once | INTRAMUSCULAR | Status: AC
  Administered 2018-01-11: 80 mg via INTRAMUSCULAR

## 2018-01-11 MED ORDER — ESOMEPRAZOLE MAGNESIUM 40 MG PO CPDR: 40.0000 mg | DELAYED_RELEASE_CAPSULE | Freq: Every day | ORAL | 1 refills | Status: DC

## 2018-01-11 MED ORDER — PREDNISONE 5 MG (21) PO TBPK: 5.0000 mg | ORAL_TABLET | ORAL | 0 refills | Status: DC

## 2018-01-11 MED ORDER — TRIAMTERENE-HCTZ 37.5-25 MG PO TABS: 1.0000 | ORAL_TABLET | Freq: Every day | ORAL | 1 refills | Status: DC

## 2018-01-11 MED ORDER — VENLAFAXINE HCL ER 150 MG PO CP24: ORAL_CAPSULE | ORAL | 1 refills | Status: DC

## 2018-01-11 MED ORDER — ROSUVASTATIN CALCIUM 10 MG PO TABS: ORAL_TABLET | ORAL | 1 refills | Status: DC

## 2018-01-11 NOTE — Patient Instructions (Addendum)
Physical with MD mid to end April, clal if you need me before\   Toradol 60 mg and depo medrol 80 mg iM todday for neck and left upper extremity pain and 3 medications are prescribed  You are referred to psychiatry for depression  Lipid, cmp and eGFr, cBc and vit D today  Hope you feel better soon, sorry about new family illness  Thank you  for choosing [facility]. We consider it a privelige to serve you.  Delivering excellent health care in a caring and  compassionate way is our goal.  Partnering with you,  so that together we can achieve this goal is our strategy.

## 2018-01-11 NOTE — Telephone Encounter (Signed)
Pt is calling to state she does not need a referral to [facility] her friend is going to be there to assist, and talk with her.

## 2018-01-12 LAB — COMPLETE METABOLIC PANEL WITH GFR
AG Ratio: 1.3 (calc) (ref 1.0–2.5)
ALBUMIN MSPROF: 4.4 g/dL (ref 3.6–5.1)
ALKALINE PHOSPHATASE (APISO): 69 U/L (ref 33–130)
ALT: 20 U/L (ref 6–29)
AST: 19 U/L (ref 10–35)
BUN: 21 mg/dL (ref 7–25)
CO2: 32 mmol/L (ref 20–32)
CREATININE: 0.93 mg/dL (ref 0.50–1.05)
Calcium: 9.5 mg/dL (ref 8.6–10.4)
Chloride: 100 mmol/L (ref 98–110)
GFR, EST AFRICAN AMERICAN: 81 mL/min/{1.73_m2} (ref >=60)
GFR, EST NON AFRICAN AMERICAN: 70 mL/min/{1.73_m2} (ref >=60)
GLOBULIN: 3.3 g/dL (ref 1.9–3.7)
Glucose, Bld: 80 mg/dL (ref 65–99)
Potassium: 4.1 mmol/L (ref 3.5–5.3)
SODIUM: 139 mmol/L (ref 135–146)
Total Bilirubin: 0.3 mg/dL (ref 0.2–1.2)
Total Protein: 7.7 g/dL (ref 6.1–8.1)

## 2018-01-12 LAB — CBC
HEMATOCRIT: 39.8 % (ref 35.0–45.0)
Hemoglobin: 13.5 g/dL (ref 11.7–15.5)
MCH: 29.2 pg (ref 27.0–33.0)
MCHC: 33.9 g/dL (ref 32.0–36.0)
MCV: 86.1 fL (ref 80.0–100.0)
MPV: 8.8 fL (ref 7.5–12.5)
Platelets: 277 10*3/uL (ref 140–400)
RBC: 4.62 10*6/uL (ref 3.80–5.10)
RDW: 12.9 % (ref 11.0–15.0)
WBC: 6.4 10*3/uL (ref 3.8–10.8)

## 2018-01-12 LAB — LIPID PANEL
CHOLESTEROL: 181 mg/dL (ref <200)
HDL: 69 mg/dL (ref >50)
LDL CHOLESTEROL (CALC): 94 mg/dL
Non-HDL Cholesterol (Calc): 112 mg/dL (calc) (ref <130)
Total CHOL/HDL Ratio: 2.6 (calc) (ref <5.0)
Triglycerides: 89 mg/dL (ref <150)

## 2018-01-12 LAB — VITAMIN D 25 HYDROXY (VIT D DEFICIENCY, FRACTURES): Vit D, 25-Hydroxy: 42 ng/mL (ref 30–100)

## 2018-01-12 NOTE — Telephone Encounter (Signed)
OK I will remove the referral

## 2018-01-15 ENCOUNTER — Encounter: Payer: Self-pay | Admitting: Family Medicine

## 2018-01-15 NOTE — Assessment & Plan Note (Signed)
Uncontrolled.Toradol and depo medrol administered IM in the office , to be followed by a short course of oral prednisone and NSAIDS.  

## 2018-01-15 NOTE — Assessment & Plan Note (Signed)
Controlled, no change in medication DASH diet and commitment to daily physical activity for a minimum of 30 minutes discussed and encouraged, as a part of hypertension management. The importance of attaining a healthy weight is also discussed.  BP/Weight 01/11/2018 08/07/2017 08/02/2017 07/05/2017 02/01/2017 01/04/2017 71/69/6789  Systolic BP 381 017 510 258 527 782 423  Diastolic BP 72 70 80 74 74 70 80  Wt. (Lbs) 129 129.75 129.08 129 123.12 126.04 127.12  BMI 23.59 23.73 23.61 23.59 22.52 23.05 23.25

## 2018-01-15 NOTE — Progress Notes (Signed)
   Toni Miller     MRN: [id number]      DOB: 04/02/1961   HPI Toni Miller is here for follow up and re-evaluation of chronic medical conditions, medication management and review of any available recent lab and radiology data.  Preventive health is updated, specifically  Cancer screening and Immunization.   Questions or concerns regarding consultations or procedures which the PT has had in the interim are  addressed. The PT denies any adverse reactions to current medications since the last visit.  C/o left sided neck and upper extremity pain to elbow x 4 days rated at an 8 , burning and stinging, no associated inciting trauma New depression since acute illness in her ex mother in law, has started  grieving again the loss of her grandmother also   ROS Denies recent fever or chills. Denies sinus pressure, nasal congestion, ear pain or sore throat. Denies chest congestion, productive cough or wheezing. Denies chest pains, palpitations and leg swelling Denies abdominal pain, nausea, vomiting,diarrhea or constipation.   Denies dysuria, frequency, hesitancy or incontinence.  Denies headaches, seizures, Denies skin break down or rash.   PE  BP 118/72   Pulse 84   Resp 16   Ht 5\' 2"  (1.575 m)   Wt 129 lb (58.5 kg)   SpO2 96%   BMI 23.59 kg/m   Patient alert and oriented and in no cardiopulmonary distress.  HEENT: No facial asymmetry, EOMI,   oropharynx pink and moist.  Neck decreased ROM with left trapezius spasm, no JVD, no mass.  Chest: Clear to auscultation bilaterally.  CVS: S1, S2 no murmurs, no S3.Regular rate.  ABD: Soft non tender.   Ext: No edema  MS: Adequate ROM spine, shoulders, hips and knees.  Skin: Intact, no ulcerations or rash noted.  Psych: Good eye contact, normal affect. Memory intact not anxious tearful and  depressed appearing.  CNS: CN 2-12 intact, power,  normal throughout.no focal deficits noted.   Assessment & Plan Neck  pain Uncontrolled.Toradol and depo medrol administered IM in the office , to be followed by a short course of oral prednisone and NSAIDS.   Hyperlipidemia with target LDL less than 100 Hyperlipidemia:Low fat diet discussed and encouraged.   Lipid Panel  Lab Results  Component Value Date   CHOL 181 01/11/2018   HDL 69 01/11/2018   LDLCALC 83 07/05/2017   TRIG 89 01/11/2018   CHOLHDL 2.6 01/11/2018   Controlled, no change in medication     Depression Increased and uncontrolled, not suicidal or homicidal, refer for therapy  Essential hypertension Controlled, no change in medication DASH diet and commitment to daily physical activity for a minimum of 30 minutes discussed and encouraged, as a part of hypertension management. The importance of attaining a healthy weight is also discussed.  BP/Weight 01/11/2018 08/07/2017 08/02/2017 07/05/2017 02/01/2017 01/04/2017 46/65/9935  Systolic BP 701 779 390 300 923 300 762  Diastolic BP 72 70 80 74 74 70 80  Wt. (Lbs) 129 129.75 129.08 129 123.12 126.04 127.12  BMI 23.59 23.73 23.61 23.59 22.52 23.05 23.25

## 2018-01-15 NOTE — Assessment & Plan Note (Signed)
Hyperlipidemia:Low fat diet discussed and encouraged.   Lipid Panel  Lab Results  Component Value Date   CHOL 181 01/11/2018   HDL 69 01/11/2018   LDLCALC 83 07/05/2017   TRIG 89 01/11/2018   CHOLHDL 2.6 01/11/2018   Controlled, no change in medication

## 2018-01-15 NOTE — Assessment & Plan Note (Signed)
Increased and uncontrolled, not suicidal or homicidal, refer for therapy

## 2018-01-24 ENCOUNTER — Telehealth: Payer: Self-pay | Admitting: Family Medicine

## 2018-01-24 NOTE — Telephone Encounter (Signed)
Patient is requesting medication for bad cough and sore throat Pharmacy: [facility] Cb#: [phone]

## 2018-01-24 NOTE — Telephone Encounter (Signed)
plds advise for sore throat gargle with salt water and teaspoon of nk honey and lime juice  2 to 3 times daily  For cough, OTC robitussin DM as directed

## 2018-01-25 ENCOUNTER — Telehealth: Payer: Self-pay | Admitting: Family Medicine

## 2018-01-25 NOTE — Telephone Encounter (Signed)
Patient is sick, cough, sinus, cant talk, sweats, no fever. You have 20 patients today. No other openings.  Do I advise urgent care? Cb#: [phone]

## 2018-01-25 NOTE — Telephone Encounter (Signed)
If no available acute slots today or tomorrow then yes

## 2018-01-25 NOTE — Telephone Encounter (Signed)
Patient aware.

## 2018-01-25 NOTE — Telephone Encounter (Signed)
PT LVM that she has a bad cough, would like you to call her to tell her what she can take.

## 2018-01-25 NOTE — Telephone Encounter (Signed)
Advised robitussin dm and to call back that if not helping

## 2018-02-07 ENCOUNTER — Other Ambulatory Visit: Payer: Self-pay

## 2018-02-07 MED ORDER — HYDROXYZINE PAMOATE 25 MG PO CAPS: ORAL_CAPSULE | ORAL | 5 refills | Status: DC

## 2018-02-07 MED ORDER — GABAPENTIN 300 MG PO CAPS: 300.0000 mg | ORAL_CAPSULE | Freq: Every day | ORAL | 1 refills | Status: DC

## 2018-02-07 MED ORDER — TIZANIDINE HCL 2 MG PO CAPS: ORAL_CAPSULE | ORAL | 3 refills | Status: DC

## 2018-02-09 ENCOUNTER — Telehealth: Payer: Self-pay | Admitting: Family Medicine

## 2018-02-09 NOTE — Telephone Encounter (Signed)
Patient called in to request 90 day refills for 1. Montelukast 2.rosuvastatin 3.Venlafaxine 4.esometrazole Pharmacy is [facility] Cb#: [phone]

## 2018-02-09 NOTE — Telephone Encounter (Signed)
These have all been refilled in march for 6 months

## 2018-02-16 ENCOUNTER — Emergency Department (HOSPITAL_COMMUNITY)
Admission: EM | Admit: 2018-02-16 | Discharge: 2018-02-16 | Disposition: A | Discharge status: Home / Self Care | Payer: Medicare Other | Attending: Emergency Medicine | Admitting: Emergency Medicine

## 2018-02-16 ENCOUNTER — Other Ambulatory Visit: Payer: Self-pay

## 2018-02-16 ENCOUNTER — Encounter (HOSPITAL_COMMUNITY): Payer: Self-pay | Admitting: Emergency Medicine

## 2018-02-16 DIAGNOSIS — I10 Essential (primary) hypertension: Secondary | ICD-10-CM | POA: Insufficient documentation

## 2018-02-16 DIAGNOSIS — Z7982 Long term (current) use of aspirin: Secondary | ICD-10-CM | POA: Insufficient documentation

## 2018-02-16 DIAGNOSIS — M5442 Lumbago with sciatica, left side: Secondary | ICD-10-CM | POA: Diagnosis not present

## 2018-02-16 DIAGNOSIS — M5432 Sciatica, left side: Secondary | ICD-10-CM | POA: Insufficient documentation

## 2018-02-16 DIAGNOSIS — Z87891 Personal history of nicotine dependence: Secondary | ICD-10-CM | POA: Insufficient documentation

## 2018-02-16 DIAGNOSIS — R1032 Left lower quadrant pain: Secondary | ICD-10-CM | POA: Diagnosis present

## 2018-02-16 DIAGNOSIS — Z79899 Other long term (current) drug therapy: Secondary | ICD-10-CM | POA: Diagnosis not present

## 2018-02-16 MED ORDER — CYCLOBENZAPRINE HCL 10 MG PO TABS: 10.0000 mg | ORAL_TABLET | Freq: Two times a day (BID) | ORAL | 0 refills | Status: DC | PRN

## 2018-02-16 MED ORDER — CYCLOBENZAPRINE HCL 10 MG PO TABS
10.0000 mg | ORAL_TABLET | Freq: Once | ORAL | Status: AC
  Administered 2018-02-16: 10 mg via ORAL
  Filled 2018-02-16: qty 1

## 2018-02-16 NOTE — Discharge Instructions (Signed)
Continue to take Advil. Do not take the muscle relaxer if driving as it will make you sleepy. Follow up with your doctor. Return here as needed.

## 2018-02-16 NOTE — ED Triage Notes (Signed)
Pt c/o of lower left back pain x 1 month ago.  No new injury denies urinary symptoms

## 2018-02-16 NOTE — ED Provider Notes (Signed)
[facility] EMERGENCY DEPARTMENT Provider Note   CSN: [id number] Arrival date & time: 02/16/18  1733     History   Chief Complaint Chief Complaint  Patient presents with  . Back Pain    HPI Toni Miller is a 57 y.o. female who presents to the ED with low back pain. Patient reports having a fall several years ago and has had some back problems since then.  Patient reports the pain started in the left lower back about a month ago. She denies any new injuries. Taking Advil with only mild relief. Patient denies UTI symptoms.   HPI  Past Medical History:  Diagnosis Date  . Arthritis   . Cancer ([place]) 1993   abnormal pap treated at [facility]  . Constipation   . GERD (gastroesophageal reflux disease)   . Glaucoma 1993  . Hyperlipidemia   . Hypertension   . Neck pain 7/09   WITH BULGING DISC-----RECIEVING EPIDURALS   . Sinusitis     Patient Active Problem List   Diagnosis Date Noted  . Neck pain 04/21/2016  . Arthritis of both knees 04/25/2015  . Internal hemorrhoids with complication 47/42/5956  . Osteopenia 04/02/2014  . Depression, major, single episode, severe ([place]) 01/06/2014  . H/O abnormal Pap smear 04/20/2013  . Urinary frequency 07/26/2012  . Thyroid nodule 08/16/2011  . Hot flashes, menopausal 06/12/2011  . Chronic constipation 06/01/2011  . BACK PAIN WITH RADICULOPATHY 12/29/2009  . Allergic rhinitis 05/14/2008  . Hyperlipidemia with target LDL less than 100 01/29/2008  . GLAUCOMA 01/29/2008  . Essential hypertension 01/29/2008  . GERD 01/29/2008    Past Surgical History:  Procedure Laterality Date  . ABDOMINAL EXPLORATION SURGERY  age 45   bowel obstruction, [facility]  . ABDOMINAL HYSTERECTOMY  2007   [facility], [facility]  . BILATERAL EYE SURGERY     for glaucoma  . BREAST BIOPSY Left    benign  . BREAST SURGERY Left 2004   left partial mastectomy, [facility]  . CATARACT EXTRACTION Right 06/04/2015  . CATARACT EXTRACTION W/PHACO  05/16/2011   Procedure: CATARACT  EXTRACTION PHACO AND INTRAOCULAR LENS PLACEMENT (IOC);  Surgeon: Tonny Branch;  Location: [facility];  Service: Ophthalmology;  Laterality: Right;  . COLONOSCOPY N/A 05/17/2013   Dr. Barnie Alderman diverticulosis was noted/small internal hemorrhoids  . CYST REMOVED LEFT BREAST /BENIGN  2005   left, [facility]  . EYE SURGERY  2010, 1992 approx   bilateral  . REPAIR IMPERFORATE ANUS / ANORECTOPLASTY     per patient  . TOTAL ABDOMINAL HYSTERECTOMY W/ BILATERAL SALPINGOOPHORECTOMY  2007   [facility], [facility]  . TUBAL LIGATION  1991     OB History   None      Home Medications    Prior to Admission medications   Medication Sig Start Date End Date Taking? Authorizing Provider  acetaminophen (TYLENOL) 325 MG tablet Take 650 mg by mouth every 6 (six) hours as needed.    [provider]  albuterol (PROAIR HFA) 108 (90 Base) MCG/ACT inhaler Inhale 2 puffs into the lungs every 6 (six) hours as needed for wheezing or shortness of breath. 06/06/17   [name], MD  aspirin EC 81 MG tablet Take 81 mg by mouth daily.    [provider]  calcium-vitamin D (OSCAL WITH D) 500-200 MG-UNIT per tablet Take 1 tablet by mouth 2 (two) times daily. 04/02/14   [name], MD  cyclobenzaprine (FLEXERIL) 10 MG tablet Take 1 tablet (10 mg total) by mouth 2 (  two) times daily as needed for muscle spasms. 02/16/18   Ashley Murrain, NP  cycloSPORINE (RESTASIS) 0.05 % ophthalmic emulsion Place 1 drop into both eyes 2 (two) times daily.    [provider]  docusate sodium (COLACE) 100 MG capsule Take 100 mg by mouth daily.    [provider]  esomeprazole (NEXIUM) 40 MG capsule Take 1 capsule (40 mg total) by mouth daily. 01/11/18   [name], MD  fluticasone (FLONASE) 50 MCG/ACT nasal spray Place 1 spray into both nostrils daily. 01/11/18   [name], MD  gabapentin (NEURONTIN) 300 MG capsule Take 1 capsule (300 mg total) by mouth at bedtime. 02/07/18   [name], MD    hydrOXYzine (VISTARIL) 25 MG capsule TAKE 1 CAPSULE BY MOUTH THREE TIMES DAILY AS NEEDED. 02/07/18   [name], MD  ibuprofen (ADVIL,MOTRIN) 800 MG tablet Take 1 tablet (800 mg total) by mouth every 8 (eight) hours as needed. 01/11/18   [name], MD  montelukast (SINGULAIR) 10 MG tablet Take 1 tablet (10 mg total) by mouth daily. 01/11/18   [name], MD  Multiple Vitamin (MULTIVITAMIN) capsule Take 1 capsule by mouth daily.    [provider]  polyethylene glycol powder (GLYCOLAX/MIRALAX) powder MIX 1 CAPFUL (17G) IN 8 OUNCES OF JUICE/WATER AND DRINK ONCE DAILY. 01/04/17   [name], MD  predniSONE (STERAPRED UNI-PAK 21 TAB) 5 MG (21) TBPK tablet Take 1 tablet (5 mg total) by mouth as directed. Use as directed 01/11/18   [name], MD  rosuvastatin (CRESTOR) 10 MG tablet TAKE (1) TABLET BY MOUTH AT BEDTIME. 01/11/18   [name], MD  tizanidine (ZANAFLEX) 2 MG capsule One capsule at bedtime as needed , for neck spasm 02/07/18   [name], MD  TRAVATAN Z 0.004 % SOLN ophthalmic solution Place 1 drop into both eyes at bedtime. 02/16/16   [provider]  triamterene-hydrochlorothiazide (MAXZIDE-25) 37.5-25 MG tablet Take 1 tablet by mouth daily. 01/11/18   [name], MD  venlafaxine XR (EFFEXOR-XR) 150 MG 24 hr capsule TAKE 1 CAPSULE BY MOUTH DAILY WITH BREAKFAST. 01/11/18   [name], MD    Family History Family History  Adopted: Yes  Problem Relation Age of Onset  . Alcohol abuse Mother   . Stomach cancer Mother 30  . Lung cancer Father   . Glaucoma Father   . Alcohol abuse Father   . Breast cancer Sister 79  . Breast cancer Sister 66  . Heart disease Sister   . Heart attack Sister   . Glaucoma Son   . Glaucoma Maternal Grandfather   . Cancer Maternal Grandfather        poss leukemia  . SIDS Brother   . Hyperthyroidism Daughter   . Seizures Son        as a Sport and exercise psychologist  . Kidney disease Maternal  Grandmother   . Heart disease Maternal Grandmother        Psychologist, forensic  . Leukemia Maternal Grandmother   . Colon cancer Neg Hx     Social History Social History   Tobacco Use  . Smoking status: Former Smoker    Packs/day: 3.00    Years: 2.00    Pack years: 6.00    Types: Cigarettes    Last attempt to quit: 05/12/1991    Years since quitting: 26.7  . Smokeless tobacco: Never Used  Substance Use Topics  . Alcohol use:  Yes    Comment: drinks a beer 1 x month  . Drug use: No     Allergies   Acyclovir and related   Review of Systems Review of Systems  Musculoskeletal: Positive for arthralgias and back pain.  All other systems reviewed and are negative.    Physical Exam Updated Vital Signs BP (!) 141/98 (BP Location: Right Arm)   Pulse (!) 109   Temp 98.5 F (36.9 C) (Oral)   Resp 16   Ht 5\' 2"  (1.575 m)   Wt 56.2 kg (124 lb)   SpO2 99%   BMI 22.68 kg/m   Physical Exam  Constitutional: She appears well-developed and well-nourished. No distress.  HENT:  Head: Normocephalic and atraumatic.  Eyes: EOM are normal.  Neck: Neck supple.  Cardiovascular: Normal rate and intact distal pulses.  Pulmonary/Chest: Effort normal.  Abdominal: Soft. There is no tenderness. There is no CVA tenderness.  Musculoskeletal:       Lumbar back: She exhibits tenderness and spasm. She exhibits no deformity and normal pulse. Decreased range of motion: due to pain.       Back:  Pain with palpation and range of motion to the right lower lumbar area and right sciatic nerve. Straight leg raises without difficulty.  Neurological: She is alert. She has normal strength. No sensory deficit.  Reflex Scores:      Bicep reflexes are 2+ on the right side and 2+ on the left side.      Brachioradialis reflexes are 2+ on the right side and 2+ on the left side.      Patellar reflexes are 2+ on the right side and 2+ on the left side. Skin: Skin is warm and dry.  Psychiatric: She has a normal mood  and affect.  Nursing note and vitals reviewed.    ED Treatments / Results  Labs (all labs ordered are listed, but only abnormal results are displayed) Labs Reviewed - No data to display  Radiology No results found.  Procedures Procedures (including critical care time)  Medications Ordered in ED Medications  cyclobenzaprine (FLEXERIL) tablet 10 mg (has no administration in time range)     Initial Impression / Assessment and Plan / ED Course  I have reviewed the triage vital signs and the nursing notes. Patient with back pain.  No neurological deficits and normal neuro exam.  Patient can walk but states is painful.  No loss of bowel or bladder control.  No concern for cauda equina.  No fever, night sweats, weight loss, h/o cancer, IVDU.  RICE protocol and pain medicine indicated and discussed with patient.   Final Clinical Impressions(s) / ED Diagnoses   Final diagnoses:  Sciatica, left side    ED Discharge Orders        Ordered    cyclobenzaprine (FLEXERIL) 10 MG tablet  2 times daily PRN     02/16/18 1902       Janit Bern Powder[place], NP 02/16/18 Einar Crow    Isla Pence, MD 02/16/18 1919

## 2018-02-19 ENCOUNTER — Other Ambulatory Visit: Payer: Self-pay

## 2018-02-19 NOTE — Patient Outreach (Signed)
[facility]) Care Management  02/19/2018  SYLVIA HELMS 08-May-1961 [id number]   Referral Date: 02/19/18 Referral Source: ED Census Referral Reason: Patient engagement tool screen   Outreach Attempt #1 Telephone call to patient for ED Census follow up.  Miller with patient.  She is able to verify HIPAA.  Patient reports she is doing ok. She reports that her pain is some better.  Patient reports she has her medications and takes with no problems.  Patient has follow up with primary physician.  Patient voices no needs and none noted on call.  Discussed [facility] Care Management services. Patient declined services at this time.     Plan: RN CM will send letter and brochure RN CM will close case at this time.   Jone Baseman, RN, MSN [facility] Care Management Care Management Coordinator Direct Line [phone] Toll Free: [phone]  Fax: [phone]

## 2018-02-20 ENCOUNTER — Encounter (HOSPITAL_COMMUNITY): Payer: Self-pay | Admitting: Emergency Medicine

## 2018-02-20 ENCOUNTER — Emergency Department (HOSPITAL_COMMUNITY)
Admission: EM | Admit: 2018-02-20 | Discharge: 2018-02-20 | Disposition: A | Discharge status: Home / Self Care | Payer: Medicare Other | Attending: Emergency Medicine | Admitting: Emergency Medicine

## 2018-02-20 DIAGNOSIS — I10 Essential (primary) hypertension: Secondary | ICD-10-CM | POA: Insufficient documentation

## 2018-02-20 DIAGNOSIS — Z7982 Long term (current) use of aspirin: Secondary | ICD-10-CM | POA: Diagnosis not present

## 2018-02-20 DIAGNOSIS — Z87891 Personal history of nicotine dependence: Secondary | ICD-10-CM | POA: Insufficient documentation

## 2018-02-20 DIAGNOSIS — Y939 Activity, unspecified: Secondary | ICD-10-CM | POA: Diagnosis not present

## 2018-02-20 DIAGNOSIS — Y999 Unspecified external cause status: Secondary | ICD-10-CM | POA: Diagnosis not present

## 2018-02-20 DIAGNOSIS — X58XXXA Exposure to other specified factors, initial encounter: Secondary | ICD-10-CM | POA: Diagnosis not present

## 2018-02-20 DIAGNOSIS — S39012A Strain of muscle, fascia and tendon of lower back, initial encounter: Secondary | ICD-10-CM | POA: Diagnosis not present

## 2018-02-20 DIAGNOSIS — Z79899 Other long term (current) drug therapy: Secondary | ICD-10-CM | POA: Diagnosis not present

## 2018-02-20 DIAGNOSIS — S3992XA Unspecified injury of lower back, initial encounter: Secondary | ICD-10-CM | POA: Diagnosis present

## 2018-02-20 DIAGNOSIS — Y929 Unspecified place or not applicable: Secondary | ICD-10-CM | POA: Diagnosis not present

## 2018-02-20 MED ORDER — KETOROLAC TROMETHAMINE 60 MG/2ML IM SOLN
60.0000 mg | Freq: Once | INTRAMUSCULAR | Status: AC
  Administered 2018-02-20: 60 mg via INTRAMUSCULAR
  Filled 2018-02-20: qty 2

## 2018-02-20 MED ORDER — DIAZEPAM 5 MG PO TABS
5.0000 mg | ORAL_TABLET | Freq: Once | ORAL | Status: AC
  Administered 2018-02-20: 5 mg via ORAL
  Filled 2018-02-20: qty 1

## 2018-02-20 MED ORDER — NAPROXEN 500 MG PO TABS: 500.0000 mg | ORAL_TABLET | Freq: Two times a day (BID) | ORAL | 0 refills | Status: DC

## 2018-02-20 NOTE — ED Triage Notes (Signed)
Patient complaining of lower back pain x 2 months. Denies injury.

## 2018-02-20 NOTE — Discharge Instructions (Signed)
Alternate ice and heat to your lower back.  Avoid bending or twisting movements for 1 week.  Call Dr. Griffin Dakin office to arrange a follow-up appointment in 1 week if not improving.

## 2018-02-20 NOTE — ED Provider Notes (Signed)
[facility] EMERGENCY DEPARTMENT Provider Note   CSN: [id number] Arrival date & time: 02/20/18  1221     History   Chief Complaint Chief Complaint  Patient presents with  . Back Pain    HPI Toni Miller is a 57 y.o. female.  HPI   Toni Miller is a 57 y.o. female who presents to the Emergency Department complaining of right-sided lower back pain for 2 months.  Pain is been worse for several days.  And increased after picking up a large basket of heavy clothing.  She reports aching and pain to her right side of her lower back.  Pain does not radiate into her extremities.  She was seen here several days ago for similar symptoms and states she did not get relief with the muscle relaxer that she was prescribed.  She denies abdominal pain, pain numbness or weakness of the lower extremities, urine or bowel changes, fever or chills.  No history of trauma.     Past Medical History:  Diagnosis Date  . Arthritis   . Cancer ([place]) 1993   abnormal pap treated at [facility]  . Constipation   . GERD (gastroesophageal reflux disease)   . Glaucoma 1993  . Hyperlipidemia   . Hypertension   . Neck pain 7/09   WITH BULGING DISC-----RECIEVING EPIDURALS   . Sinusitis     Patient Active Problem List   Diagnosis Date Noted  . Neck pain 04/21/2016  . Arthritis of both knees 04/25/2015  . Internal hemorrhoids with complication 50/53/9767  . Osteopenia 04/02/2014  . Depression, major, single episode, severe ([place]) 01/06/2014  . H/O abnormal Pap smear 04/20/2013  . Urinary frequency 07/26/2012  . Thyroid nodule 08/16/2011  . Hot flashes, menopausal 06/12/2011  . Chronic constipation 06/01/2011  . BACK PAIN WITH RADICULOPATHY 12/29/2009  . Allergic rhinitis 05/14/2008  . Hyperlipidemia with target LDL less than 100 01/29/2008  . GLAUCOMA 01/29/2008  . Essential hypertension 01/29/2008  . GERD 01/29/2008    Past Surgical History:  Procedure Laterality Date  . ABDOMINAL EXPLORATION  SURGERY  age 74   bowel obstruction, [facility]  . ABDOMINAL HYSTERECTOMY  2007   [facility], [facility]  . BILATERAL EYE SURGERY     for glaucoma  . BREAST BIOPSY Left    benign  . BREAST SURGERY Left 2004   left partial mastectomy, [facility]  . CATARACT EXTRACTION Right 06/04/2015  . CATARACT EXTRACTION W/PHACO  05/16/2011   Procedure: CATARACT EXTRACTION PHACO AND INTRAOCULAR LENS PLACEMENT (IOC);  Surgeon: Tonny Branch;  Location: [facility];  Service: Ophthalmology;  Laterality: Right;  . COLONOSCOPY N/A 05/17/2013   Dr. Barnie Alderman diverticulosis was noted/small internal hemorrhoids  . CYST REMOVED LEFT BREAST /BENIGN  2005   left, [facility]  . EYE SURGERY  2010, 1992 approx   bilateral  . REPAIR IMPERFORATE ANUS / ANORECTOPLASTY     per patient  . TOTAL ABDOMINAL HYSTERECTOMY W/ BILATERAL SALPINGOOPHORECTOMY  2007   [facility], [facility]  . TUBAL LIGATION  1991     OB History   None      Home Medications    Prior to Admission medications   Medication Sig Start Date End Date Taking? Authorizing Provider  acetaminophen (TYLENOL) 325 MG tablet Take 650 mg by mouth every 6 (six) hours as needed.    [provider]  albuterol (PROAIR HFA) 108 (90 Base) MCG/ACT inhaler Inhale 2 puffs into the lungs every 6 (six) hours as needed for wheezing or shortness of breath. 06/06/17  [name], MD  aspirin EC 81 MG tablet Take 81 mg by mouth daily.    [provider]  calcium-vitamin D (OSCAL WITH D) 500-200 MG-UNIT per tablet Take 1 tablet by mouth 2 (two) times daily. 04/02/14   [name], MD  cyclobenzaprine (FLEXERIL) 10 MG tablet Take 1 tablet (10 mg total) by mouth 2 (two) times daily as needed for muscle spasms. 02/16/18   Ashley Murrain, NP  cycloSPORINE (RESTASIS) 0.05 % ophthalmic emulsion Place 1 drop into both eyes 2 (two) times daily.    [provider]  docusate sodium (COLACE) 100 MG capsule Take 100 mg by mouth daily.    [provider]  esomeprazole (NEXIUM) 40 MG  capsule Take 1 capsule (40 mg total) by mouth daily. 01/11/18   [name], MD  fluticasone (FLONASE) 50 MCG/ACT nasal spray Place 1 spray into both nostrils daily. 01/11/18   [name], MD  gabapentin (NEURONTIN) 300 MG capsule Take 1 capsule (300 mg total) by mouth at bedtime. 02/07/18   [name], MD  hydrOXYzine (VISTARIL) 25 MG capsule TAKE 1 CAPSULE BY MOUTH THREE TIMES DAILY AS NEEDED. 02/07/18   [name], MD  ibuprofen (ADVIL,MOTRIN) 800 MG tablet Take 1 tablet (800 mg total) by mouth every 8 (eight) hours as needed. 01/11/18   [name], MD  montelukast (SINGULAIR) 10 MG tablet Take 1 tablet (10 mg total) by mouth daily. 01/11/18   [name], MD  Multiple Vitamin (MULTIVITAMIN) capsule Take 1 capsule by mouth daily.    [provider]  polyethylene glycol powder (GLYCOLAX/MIRALAX) powder MIX 1 CAPFUL (17G) IN 8 OUNCES OF JUICE/WATER AND DRINK ONCE DAILY. 01/04/17   [name], MD  predniSONE (STERAPRED UNI-PAK 21 TAB) 5 MG (21) TBPK tablet Take 1 tablet (5 mg total) by mouth as directed. Use as directed 01/11/18   [name], MD  rosuvastatin (CRESTOR) 10 MG tablet TAKE (1) TABLET BY MOUTH AT BEDTIME. 01/11/18   [name], MD  tizanidine (ZANAFLEX) 2 MG capsule One capsule at bedtime as needed , for neck spasm 02/07/18   [name], MD  TRAVATAN Z 0.004 % SOLN ophthalmic solution Place 1 drop into both eyes at bedtime. 02/16/16   [provider]  triamterene-hydrochlorothiazide (MAXZIDE-25) 37.5-25 MG tablet Take 1 tablet by mouth daily. 01/11/18   [name], MD  venlafaxine XR (EFFEXOR-XR) 150 MG 24 hr capsule TAKE 1 CAPSULE BY MOUTH DAILY WITH BREAKFAST. 01/11/18   [name], MD    Family History Family History  Adopted: Yes  Problem Relation Age of Onset  . Alcohol abuse Mother   . Stomach cancer Mother 96  . Lung cancer Father   . Glaucoma Father   . Alcohol abuse Father    . Breast cancer Sister 31  . Breast cancer Sister 55  . Heart disease Sister   . Heart attack Sister   . Glaucoma Son   . Glaucoma Maternal Grandfather   . Cancer Maternal Grandfather        poss leukemia  . SIDS Brother   . Hyperthyroidism Daughter   . Seizures Son        as a Sport and exercise psychologist  . Kidney disease Maternal Grandmother   . Heart disease Maternal Grandmother        Psychologist, forensic  . Leukemia Maternal Grandmother   . Colon cancer Neg Hx     Social History  Social History   Tobacco Use  . Smoking status: Former Smoker    Packs/day: 3.00    Years: 2.00    Pack years: 6.00    Types: Cigarettes    Last attempt to quit: 05/12/1991    Years since quitting: 26.7  . Smokeless tobacco: Never Used  Substance Use Topics  . Alcohol use: Never    Frequency: Never  . Drug use: No     Allergies   Acyclovir and related   Review of Systems Review of Systems  Constitutional: Negative for fever.  Respiratory: Negative for shortness of breath.   Gastrointestinal: Negative for abdominal pain, constipation and vomiting.  Genitourinary: Negative for decreased urine volume, difficulty urinating, dysuria, flank pain and hematuria.  Musculoskeletal: Positive for back pain. Negative for joint swelling.  Skin: Negative for rash.  Neurological: Negative for weakness and numbness.  All other systems reviewed and are negative.    Physical Exam Updated Vital Signs BP (!) 140/99 (BP Location: Left Arm)   Pulse 97   Temp 98.6 F (37 C) (Oral)   Resp 18   Wt 56.2 kg (124 lb)   SpO2 98%   BMI 22.68 kg/m   Physical Exam  Constitutional: She is oriented to person, place, and time. She appears well-developed and well-nourished. No distress.  HENT:  Head: Normocephalic and atraumatic.  Neck: Normal range of motion. Neck supple.  Cardiovascular: Normal rate, regular rhythm and intact distal pulses.  DP pulses are strong and palpable bilaterally  Pulmonary/Chest: Effort normal and breath  sounds normal. No respiratory distress.  Abdominal: Soft. She exhibits no distension. There is no tenderness.  Musculoskeletal: She exhibits tenderness. She exhibits no edema.       Lumbar back: She exhibits tenderness and pain. She exhibits normal range of motion, no swelling, no deformity, no laceration and normal pulse.  Focal tenderness to palpation of the right lower lumbar paraspinal muscles.  No spinal tenderness.  Pt has 5/5 strength against resistance of bilateral lower extremities.  Negative straight leg raise bilaterally   Neurological: She is alert and oriented to person, place, and time. She has normal strength. No sensory deficit. She exhibits normal muscle tone. Coordination and gait normal.  Reflex Scores:      Patellar reflexes are 2+ on the right side and 2+ on the left side.      Achilles reflexes are 2+ on the right side and 2+ on the left side. Skin: Skin is warm and dry. Capillary refill takes less than 2 seconds. No rash noted.  Nursing note and vitals reviewed.    ED Treatments / Results  Labs (all labs ordered are listed, but only abnormal results are displayed) Labs Reviewed - No data to display  EKG None  Radiology No results found.  Procedures Procedures (including critical care time)  Medications Ordered in ED Medications  ketorolac (TORADOL) injection 60 mg (60 mg Intramuscular Given 02/20/18 1332)  diazepam (VALIUM) tablet 5 mg (5 mg Oral Given 02/20/18 1332)     Initial Impression / Assessment and Plan / ED Course  I have reviewed the triage vital signs and the nursing notes.  Pertinent labs & imaging results that were available during my care of the patient were reviewed by me and considered in my medical decision making (see chart for details).     Review of medical records, nml BUN and creat from one month ago.    Patient well-appearing.  Ambulatory with a steady gait.  No focal  neuro deficits on exam.  No concerning symptoms for cauda  equina.  Reports feeling better after medications.  Symptoms are likely related to a strain of the lumbar area.  She agrees to treatment plan with anti-inflammatory she has a prescription for Flexeril at home.  She agrees to see her PCP for follow-up in 1 week if not improving.  Final Clinical Impressions(s) / ED Diagnoses   Final diagnoses:  Strain of lumbar region, initial encounter    ED Discharge Orders    None       Kem Parkinson, PA-C 02/20/18 1428    Fredia Sorrow, MD 02/21/18 [phone]

## 2018-03-08 ENCOUNTER — Ambulatory Visit: Payer: Self-pay | Admitting: Family Medicine

## 2018-03-13 ENCOUNTER — Encounter: Payer: Self-pay | Admitting: Family Medicine

## 2018-03-13 ENCOUNTER — Ambulatory Visit (INDEPENDENT_AMBULATORY_CARE_PROVIDER_SITE_OTHER): Payer: Medicare Other | Admitting: Family Medicine

## 2018-03-13 VITALS — BP 124/84 | HR 81 | Resp 16 | Ht 62.0 in | Wt 131.0 lb

## 2018-03-13 DIAGNOSIS — J302 Other seasonal allergic rhinitis: Secondary | ICD-10-CM | POA: Diagnosis not present

## 2018-03-13 DIAGNOSIS — E785 Hyperlipidemia, unspecified: Secondary | ICD-10-CM

## 2018-03-13 DIAGNOSIS — I10 Essential (primary) hypertension: Secondary | ICD-10-CM

## 2018-03-13 DIAGNOSIS — Z09 Encounter for follow-up examination after completed treatment for conditions other than malignant neoplasm: Secondary | ICD-10-CM

## 2018-03-13 NOTE — Patient Instructions (Signed)
Physical exam with MD in late June to mid July, call if you need me sooner  Wellness with nurse mid October  No med changes  please change from sodas and juce to water and more fruit and vegetable  It is important that you exercise regularly at least 30 minutes 5 times a week. If you develop chest pain, have severe difficulty breathing, or feel very tired, stop exercising immediately and seek medical attention    Aim to lose 4 pounds over the Summer

## 2018-03-16 DIAGNOSIS — Z09 Encounter for follow-up examination after completed treatment for conditions other than malignant neoplasm: Secondary | ICD-10-CM | POA: Insufficient documentation

## 2018-03-16 DIAGNOSIS — Z1211 Encounter for screening for malignant neoplasm of colon: Secondary | ICD-10-CM | POA: Insufficient documentation

## 2018-03-16 NOTE — Progress Notes (Signed)
   Toni Miller     MRN: [id number]      DOB: May 25, 1961   HPI Ms. Haughn is here for follow up of ED visit on 4/16 when she presented with acute sciatic after lifting a basket of clothes States she knows this was not the right thing to do and does not intend to repeat The PT denies any adverse reactions to current medications since the last visit.  Chronic conditions are assesses also   ROS Denies recent fever or chills. Denies sinus pressure, nasal congestion, ear pain or sore throat. Denies chest congestion, productive cough or wheezing. Denies chest pains, palpitations and leg swelling Denies abdominal pain, nausea, vomiting,diarrhea or constipation.   Denies dysuria, frequency, hesitancy or incontinence.  Denies headaches, seizures, numbness, or tingling. Denies depression, anxiety or insomnia.Did not go through with telepsych and does not need it anymore. Denies skin break down or rash.   PE  BP 124/84   Pulse 81   Resp 16   Ht 5\' 2"  (1.575 m)   Wt 131 lb (59.4 kg)   SpO2 99%   BMI 23.96 kg/m   Patient alert and oriented and in no cardiopulmonary distress.  HEENT: No facial asymmetry, EOMI,   oropharynx pink and moist.  Neck supple no JVD, no mass.  Chest: Clear to auscultation bilaterally.  CVS: S1, S2 no murmurs, no S3.Regular rate.  ABD: Soft non tender.   Ext: No edema  MS: Adequate ROM spine, shoulders, hips and knees.  Skin: Intact, no ulcerations or rash noted.  Psych: Good eye contact, normal affect. Memory intact not anxious or depressed appearing.  CNS: CN 2-12 intact, power,  normal throughout.no focal deficits noted.   Assessment & Plan  Encounter for examination following treatment at hospital Presented wih acute lumbar sprain following heavy lifting which has resolved Educated re correct technique for lifting and also of back strengthening exercises  Allergic rhinitis Controlled, no change in medication   Essential  hypertension Controlled, no change in medication DASH diet and commitment to daily physical activity for a minimum of 30 minutes discussed and encouraged, as a part of hypertension management. The importance of attaining a healthy weight is also discussed.  BP/Weight 03/13/2018 02/20/2018 02/16/2018 01/11/2018 08/07/2017 08/02/2017 05/15/6282  Systolic BP 662 947 654 650 354 656 812  Diastolic BP 84 99 80 72 70 80 74  Wt. (Lbs) 131 124 124 129 129.75 129.08 129  BMI 23.96 22.68 22.68 23.59 23.73 23.61 23.59       Hyperlipidemia with target LDL less than 100 Hyperlipidemia:Low fat diet discussed and encouraged.   Lipid Panel  Lab Results  Component Value Date   CHOL 181 01/11/2018   HDL 69 01/11/2018   LDLCALC 94 01/11/2018   TRIG 89 01/11/2018   CHOLHDL 2.6 01/11/2018  Controlled, no change in medication

## 2018-03-16 NOTE — Assessment & Plan Note (Signed)
Controlled, no change in medication DASH diet and commitment to daily physical activity for a minimum of 30 minutes discussed and encouraged, as a part of hypertension management. The importance of attaining a healthy weight is also discussed.  BP/Weight 03/13/2018 02/20/2018 02/16/2018 01/11/2018 08/07/2017 08/02/2017 6/95/0722  Systolic BP 575 051 833 582 518 984 210  Diastolic BP 84 99 80 72 70 80 74  Wt. (Lbs) 131 124 124 129 129.75 129.08 129  BMI 23.96 22.68 22.68 23.59 23.73 23.61 23.59

## 2018-03-16 NOTE — Assessment & Plan Note (Signed)
Hyperlipidemia:Low fat diet discussed and encouraged.   Lipid Panel  Lab Results  Component Value Date   CHOL 181 01/11/2018   HDL 69 01/11/2018   LDLCALC 94 01/11/2018   TRIG 89 01/11/2018   CHOLHDL 2.6 01/11/2018  Controlled, no change in medication

## 2018-03-16 NOTE — Assessment & Plan Note (Signed)
Presented wih acute lumbar sprain following heavy lifting which has resolved Educated re correct technique for lifting and also of back strengthening exercises

## 2018-03-16 NOTE — Assessment & Plan Note (Signed)
Controlled, no change in medication  

## 2018-05-17 ENCOUNTER — Encounter: Payer: Self-pay | Admitting: Family Medicine

## 2018-05-18 ENCOUNTER — Encounter: Payer: Self-pay | Admitting: Family Medicine

## 2018-06-06 ENCOUNTER — Other Ambulatory Visit: Payer: Self-pay

## 2018-06-06 ENCOUNTER — Encounter: Payer: Self-pay | Admitting: Family Medicine

## 2018-06-06 ENCOUNTER — Ambulatory Visit (INDEPENDENT_AMBULATORY_CARE_PROVIDER_SITE_OTHER): Payer: Medicare Other | Admitting: Family Medicine

## 2018-06-06 ENCOUNTER — Other Ambulatory Visit (HOSPITAL_COMMUNITY)
Admission: RE | Admit: 2018-06-06 | Discharge: 2018-06-06 | Disposition: A | Discharge status: Home / Self Care | Payer: Medicare Other | Source: Ambulatory Visit | Attending: Family Medicine | Admitting: Family Medicine

## 2018-06-06 VITALS — BP 120/80 | HR 86 | Resp 12 | Ht 62.0 in | Wt 132.0 lb

## 2018-06-06 DIAGNOSIS — N760 Acute vaginitis: Secondary | ICD-10-CM | POA: Insufficient documentation

## 2018-06-06 DIAGNOSIS — Z0000 Encounter for general adult medical examination without abnormal findings: Secondary | ICD-10-CM

## 2018-06-06 DIAGNOSIS — G8929 Other chronic pain: Secondary | ICD-10-CM

## 2018-06-06 DIAGNOSIS — N930 Postcoital and contact bleeding: Secondary | ICD-10-CM | POA: Diagnosis present

## 2018-06-06 DIAGNOSIS — M25511 Pain in right shoulder: Secondary | ICD-10-CM | POA: Diagnosis not present

## 2018-06-06 DIAGNOSIS — N63 Unspecified lump in unspecified breast: Secondary | ICD-10-CM

## 2018-06-06 DIAGNOSIS — Z803 Family history of malignant neoplasm of breast: Secondary | ICD-10-CM | POA: Diagnosis not present

## 2018-06-06 DIAGNOSIS — N644 Mastodynia: Secondary | ICD-10-CM | POA: Diagnosis not present

## 2018-06-06 NOTE — Assessment & Plan Note (Addendum)
4 month history of increase pain and swelling with limitation in mobility, refer to ortho

## 2018-06-06 NOTE — Assessment & Plan Note (Signed)

## 2018-06-06 NOTE — Assessment & Plan Note (Signed)
3 month h/o intermittent post coital bleeding , and some discharge, states she is dry

## 2018-06-06 NOTE — Assessment & Plan Note (Signed)
6 week h/o intermittent bleeding with discharge and h/o vaginal dryness.post intercourse specimens sent for wet prep and she is to start using a lubricant

## 2018-06-06 NOTE — Patient Instructions (Addendum)
Wellness with nurse September 20 or after,Fklu vaccine at visit Please schedule mammogram and Breast US at checkout or call pt with appt info   You are referred to orthopedics re left shoulder Use REPLENS as a lubricant, before intercourse  MD follow up in January    You need fasting lipid, cmp and EGFR, and TSH in [place] 1 week before visit

## 2018-06-06 NOTE — Assessment & Plan Note (Addendum)
2 month history, nodule at 6 o clock left breast , needs diagnostic, 2 sisters have breast cancer

## 2018-06-06 NOTE — Progress Notes (Signed)
    Toni Miller     MRN: [id number]      DOB: 08/15/1961  HPI: Patient is in for annual physical exam. Left breast  Pain and nodule x 2 months, 2 siblings have breast cancer  C/o left shoulder pain  With swelling and reduced mobility x 5 moinhts    Immunization is reviewed , and  updated if needed.   PE: BP 120/80   Pulse 86   Resp 12   Ht 5\' 2"  (1.575 m)   Wt 132 lb (59.9 kg)   SpO2 98%   BMI 24.14 kg/m    Pleasant  female, alert and oriented x 3, in no cardio-pulmonary distress. Afebrile. HEENT No facial trauma or asymetry. Sinuses non tender.  Extra occullar muscles intact, pupils equally reactive to light. External ears normal, tympanic membranes clear. Oropharynx moist, no exudate. Neck: supple, no adenopathy,JVD or thyromegaly.No bruits.  Chest: Clear to ascultation bilaterally.No crackles or wheezes. Non tender to palpation  Breast: No asymetry,bilateral  tenderness. No nipple discharge or inversion. No axillary or supraclavicular adenopathy Left breast more tender than right , and pea sized nodule palpable at 6 o clock position  Of left breast  C/o bleeding atret intercourse intermittently over the past 3 to 4 months, also c/omalodorous discharge  Cardiovascular system; Heart sounds normal,  S1 and  S2 ,no S3.  No murmur, or thrill. Apical beat not displaced Peripheral pulses normal.  Abdomen: Soft, non tender, no organomegaly or masses. No bruits. Bowel sounds normal. No guarding, tenderness or rebound.  Rectal:  Cologuard testing.  GU: External genitalia normal female genitalia , normal female distribution of hair. No lesions. Urethral meatus normal in size, no  Prolapse, no lesions visibly  Present. Bladder non tender. Vagina pink and moist , with no visible lesions ,whoite  discharge present .  Uterus absent , no adnexal masses, no adnexal tenderness.   Musculoskeletal exam: Full ROM of spine, hips ,  and knees.Decreased ROM left  shoulder which has swelling and id is tender  No muscle wasting or atrophy.   Neurologic: Cranial nerves 2 to 12 intact. Power, tone ,sensation and reflexes normal throughout. No disturbance in gait. No tremor.  Skin: Intact, no ulceration, erythema , scaling or rash noted. Pigmentation normal throughout  Psych; Normal mood and affect. Judgement and concentration normal   Assessment & Plan:  Annual physical exam Annual exam as documented. Counseling done  re healthy lifestyle involving commitment to 150 minutes exercise per week, heart healthy diet, and attaining healthy weight.The importance of adequate sleep also discussed. Regular seat belt use and home safety, is also discussed. Changes in health habits are decided on by the patient with goals and time frames  set for achieving them. Immunization and cancer screening needs are specifically addressed at this visit.   Pain of both breasts 2 month history, nodule at 6 o clock left breast , needs diagnostic, 2 sisters have breast cancer  Shoulder pain, right 4 month history of increase pain and swelling with limitation in mobility, refer to ortho  Vulvovaginitis 3 month h/o intermittent post coital bleeding , and some discharge, states she is dry   PCB (post coital bleeding) 6 week h/o intermittent bleeding with discharge and h/o vaginal dryness.post intercourse specimens sent for wet prep and she is to start using a lubricant

## 2018-06-08 ENCOUNTER — Telehealth: Payer: Self-pay

## 2018-06-08 DIAGNOSIS — Z1211 Encounter for screening for malignant neoplasm of colon: Secondary | ICD-10-CM

## 2018-06-08 LAB — CERVICOVAGINAL ANCILLARY ONLY
Bacterial vaginitis: NEGATIVE
Candida vaginitis: NEGATIVE
Chlamydia: NEGATIVE
Neisseria Gonorrhea: NEGATIVE
Trichomonas: NEGATIVE

## 2018-06-08 NOTE — Telephone Encounter (Signed)
Spoke with patient and gave her recent lab results with verbal understanding. Patient asked about cologuard. In Dr.Simpson's office visit notes she stated she wants the patient to complete the cologuard. Order entered and printed and faxed.

## 2018-06-11 LAB — COLOGUARD: COLOGUARD: NEGATIVE

## 2018-06-13 ENCOUNTER — Other Ambulatory Visit: Payer: Self-pay | Admitting: Family Medicine

## 2018-06-13 DIAGNOSIS — R229 Localized swelling, mass and lump, unspecified: Principal | ICD-10-CM

## 2018-06-13 DIAGNOSIS — IMO0002 Reserved for concepts with insufficient information to code with codable children: Secondary | ICD-10-CM

## 2018-06-14 DIAGNOSIS — Z1211 Encounter for screening for malignant neoplasm of colon: Secondary | ICD-10-CM | POA: Diagnosis not present

## 2018-06-19 ENCOUNTER — Ambulatory Visit (HOSPITAL_COMMUNITY)
Admission: RE | Admit: 2018-06-19 | Discharge: 2018-06-19 | Disposition: A | Discharge status: Home / Self Care | Payer: Medicare Other | Source: Ambulatory Visit | Attending: Family Medicine | Admitting: Family Medicine

## 2018-06-19 DIAGNOSIS — N63 Unspecified lump in unspecified breast: Secondary | ICD-10-CM

## 2018-06-19 DIAGNOSIS — N644 Mastodynia: Secondary | ICD-10-CM | POA: Insufficient documentation

## 2018-06-19 DIAGNOSIS — Z803 Family history of malignant neoplasm of breast: Secondary | ICD-10-CM | POA: Diagnosis not present

## 2018-06-19 DIAGNOSIS — R928 Other abnormal and inconclusive findings on diagnostic imaging of breast: Secondary | ICD-10-CM | POA: Diagnosis not present

## 2018-06-25 LAB — COLOGUARD

## 2018-06-26 ENCOUNTER — Ambulatory Visit (INDEPENDENT_AMBULATORY_CARE_PROVIDER_SITE_OTHER): Payer: Medicare Other

## 2018-06-26 ENCOUNTER — Encounter: Payer: Self-pay | Admitting: Orthopaedic Surgery

## 2018-06-26 ENCOUNTER — Ambulatory Visit (INDEPENDENT_AMBULATORY_CARE_PROVIDER_SITE_OTHER): Payer: Medicare Other | Admitting: Orthopaedic Surgery

## 2018-06-26 VITALS — BP 145/89 | HR 89 | Ht 62.0 in | Wt 130.0 lb

## 2018-06-26 DIAGNOSIS — M25512 Pain in left shoulder: Secondary | ICD-10-CM | POA: Diagnosis not present

## 2018-06-26 MED ORDER — NAPROXEN 500 MG PO TABS: 500.0000 mg | ORAL_TABLET | Freq: Two times a day (BID) | ORAL | 5 refills | Status: DC

## 2018-06-26 NOTE — Progress Notes (Signed)
Subjective:    Patient ID: Toni Miller, female    DOB: Aug 19, 1961, 57 y.o.   MRN: [id number]  HPI She has had pain in the left shoulder for four to six weeks.  She has pain with overhead use.  She has no trauma, no weakness, no numbness, no redness.  She had bursitis years ago that responded to an injection.  She has tried Tylenol and Advil with no help as well as heat.  She saw Dr. Moshe Cipro recently about this among other things.   Review of Systems  Constitutional: Positive for activity change.  Musculoskeletal: Positive for arthralgias.  All other systems reviewed and are negative.  For Review of Systems, all other systems reviewed and are negative.  The following is a summary of the past history medically, past history surgically, known current medicines, social history and family history.  This information is gathered electronically by the computer from prior information and documentation.  I review this each visit and have found including this information at this point in the chart is beneficial and informative.   Past Medical History:  Diagnosis Date  . Arthritis   . Cancer ([place]) 1993   abnormal pap treated at [facility]  . Constipation   . GERD (gastroesophageal reflux disease)   . Glaucoma 1993  . Hyperlipidemia   . Hypertension   . Neck pain 7/09   WITH BULGING DISC-----RECIEVING EPIDURALS   . Sinusitis     Past Surgical History:  Procedure Laterality Date  . ABDOMINAL EXPLORATION SURGERY  age 74   bowel obstruction, [facility]  . ABDOMINAL HYSTERECTOMY  2007   [facility], [facility]  . BILATERAL EYE SURGERY     for glaucoma  . BREAST BIOPSY Left    benign  . BREAST SURGERY Left 2004   left partial mastectomy, [facility]  . CATARACT EXTRACTION Right 06/04/2015  . CATARACT EXTRACTION W/PHACO  05/16/2011   Procedure: CATARACT EXTRACTION PHACO AND INTRAOCULAR LENS PLACEMENT (IOC);  Surgeon: Tonny Branch;  Location: [facility];  Service: Ophthalmology;  Laterality: Right;  . COLONOSCOPY N/A  05/17/2013   Dr. Barnie Alderman diverticulosis was noted/small internal hemorrhoids  . CYST REMOVED LEFT BREAST /BENIGN  2005   left, [facility]  . EYE SURGERY  2010, 1992 approx   bilateral  . REPAIR IMPERFORATE ANUS / ANORECTOPLASTY     per patient  . TOTAL ABDOMINAL HYSTERECTOMY W/ BILATERAL SALPINGOOPHORECTOMY  2007   [facility], [facility]  . TUBAL LIGATION  1991    Current Outpatient Medications on File Prior to Visit  Medication Sig Dispense Refill  . aspirin EC 81 MG tablet Take 81 mg by mouth daily.    . calcium-vitamin D (OSCAL WITH D) 500-200 MG-UNIT per tablet Take 1 tablet by mouth 2 (two) times daily. 60 tablet 5  . cycloSPORINE (RESTASIS) 0.05 % ophthalmic emulsion Place 1 drop into both eyes 2 (two) times daily.    Marland Kitchen esomeprazole (NEXIUM) 40 MG capsule Take 1 capsule (40 mg total) by mouth daily. 90 capsule 1  . fluticasone (FLONASE) 50 MCG/ACT nasal spray Place 1 spray into both nostrils daily. 16 g 6  . gabapentin (NEURONTIN) 300 MG capsule Take 1 capsule (300 mg total) by mouth at bedtime. 90 capsule 1  . hydrOXYzine (VISTARIL) 25 MG capsule TAKE 1 CAPSULE BY MOUTH THREE TIMES DAILY AS NEEDED. 30 capsule 5  . montelukast (SINGULAIR) 10 MG tablet Take 1 tablet (10 mg total) by mouth daily. 90 tablet 1  . Multiple Vitamin (MULTIVITAMIN) capsule Take  1 capsule by mouth daily.    . polyethylene glycol powder (GLYCOLAX/MIRALAX) powder MIX 1 CAPFUL (17G) IN 8 OUNCES OF JUICE/WATER AND DRINK ONCE DAILY. 255 g 3  . rosuvastatin (CRESTOR) 10 MG tablet TAKE (1) TABLET BY MOUTH AT BEDTIME. 90 tablet 1  . triamterene-hydrochlorothiazide (MAXZIDE-25) 37.5-25 MG tablet Take 1 tablet by mouth daily. 90 tablet 1  . venlafaxine XR (EFFEXOR-XR) 150 MG 24 hr capsule TAKE 1 CAPSULE BY MOUTH DAILY WITH BREAKFAST. 90 capsule 1  . cyclobenzaprine (FLEXERIL) 10 MG tablet Take 1 tablet (10 mg total) by mouth 2 (two) times daily as needed for muscle spasms. 20 tablet 0  . tizanidine (ZANAFLEX) 2 MG capsule One capsule  at bedtime as needed , for neck spasm (Patient not taking: Reported on 06/26/2018) 30 capsule 3  . TRAVATAN Z 0.004 % SOLN ophthalmic solution Place 1 drop into both eyes at bedtime.     No current facility-administered medications on file prior to visit.     Social History   Socioeconomic History  . Marital status: Legally Separated    Spouse name: Not on file  . Number of children: 3  . Years of education: Not on file  . Highest education level: Not on file  Occupational History  . Occupation: UNEMPLOYED 2011    Comment: disability    Employer: DISABLED  Social Needs  . Financial resource strain: Not on file  . Food insecurity:    Worry: Not on file    Inability: Not on file  . Transportation needs:    Medical: Not on file    Non-medical: Not on file  Tobacco Use  . Smoking status: Former Smoker    Packs/day: 3.00    Years: 2.00    Pack years: 6.00    Types: Cigarettes    Last attempt to quit: 05/12/1991    Years since quitting: 27.1  . Smokeless tobacco: Never Used  Substance and Sexual Activity  . Alcohol use: Never    Frequency: Never  . Drug use: No  . Sexual activity: Yes    Birth control/protection: Surgical  Lifestyle  . Physical activity:    Days per week: Not on file    Minutes per session: Not on file  . Stress: Not on file  Relationships  . Social connections:    Talks on phone: Not on file    Gets together: Not on file    Attends religious service: Not on file    Active member of club or organization: Not on file    Attends meetings of clubs or organizations: Not on file    Relationship status: Not on file  . Intimate partner violence:    Fear of current or ex partner: Not on file    Emotionally abused: Not on file    Physically abused: Not on file    Forced sexual activity: Not on file  Other Topics Concern  . Not on file  Social History Narrative  . Not on file    Family History  Adopted: Yes  Problem Relation Age of Onset  . Alcohol  abuse Mother   . Stomach cancer Mother 8  . Lung cancer Father   . Glaucoma Father   . Alcohol abuse Father   . Breast cancer Sister 20  . Breast cancer Sister 47  . Heart disease Sister   . Heart attack Sister   . Glaucoma Son   . Glaucoma Maternal Grandfather   . Cancer Maternal Grandfather  poss leukemia  . SIDS Brother   . Hyperthyroidism Daughter   . Seizures Son        as a Sport and exercise psychologist  . Kidney disease Maternal Grandmother   . Heart disease Maternal Grandmother        Psychologist, forensic  . Leukemia Maternal Grandmother   . Colon cancer Neg Hx     BP (!) 145/89   Pulse 89   Ht 5\' 2"  (1.575 m)   Wt 130 lb (59 kg)   BMI 23.78 kg/m   Body mass index is 23.78 kg/m.     Objective:   Physical Exam  Constitutional: She is oriented to person, place, and time. She appears well-developed and well-nourished.  HENT:  Head: Normocephalic and atraumatic.  Eyes: Pupils are equal, round, and reactive to light. Conjunctivae and EOM are normal.  Neck: Normal range of motion. Neck supple.  Cardiovascular: Normal rate, regular rhythm and intact distal pulses.  Pulmonary/Chest: Effort normal.  Abdominal: Soft.  Musculoskeletal:       Left shoulder: She exhibits decreased range of motion and tenderness.       Arms: Neurological: She is alert and oriented to person, place, and time. She has normal reflexes. She displays normal reflexes. No cranial nerve deficit. She exhibits normal muscle tone. Coordination normal.  Skin: Skin is warm and dry.  Psychiatric: She has a normal mood and affect. Her behavior is normal. Judgment and thought content normal.     X-rays were done of the left shoulder, reported separately.     Assessment & Plan:   Encounter Diagnosis  Name Primary?  . Pain in joint of left shoulder Yes   PROCEDURE NOTE:  The patient request injection, verbal consent was obtained.  The left shoulder was prepped appropriately after time out was performed.   Sterile  technique was observed and injection of 1 cc of Depo-Medrol 40 mg with several cc's of plain xylocaine. Anesthesia was provided by ethyl chloride and a 20-gauge needle was used to inject the shoulder area. A posterior approach was used.  The injection was tolerated well.  A band aid dressing was applied.  The patient was advised to apply ice later today and tomorrow to the injection sight as needed.  I will give Rx for Naprosyn 500 po bid pc  Call if any problem.  Precautions discussed.   Return in three weeks.  Electronically Signed Sanjuana Kava, MD 8/20/20192:19 PM

## 2018-07-17 ENCOUNTER — Encounter: Payer: Self-pay | Admitting: Orthopaedic Surgery

## 2018-07-17 ENCOUNTER — Ambulatory Visit (INDEPENDENT_AMBULATORY_CARE_PROVIDER_SITE_OTHER): Payer: Medicare Other | Admitting: Orthopaedic Surgery

## 2018-07-17 VITALS — BP 122/80 | HR 80 | Ht 62.0 in | Wt 130.0 lb

## 2018-07-17 DIAGNOSIS — M25512 Pain in left shoulder: Secondary | ICD-10-CM | POA: Diagnosis not present

## 2018-07-17 NOTE — Progress Notes (Signed)
Patient Toni Miller, female DOB:1961-07-31, 57 y.o. NGE:[id number]  Chief Complaint  Patient presents with  . Shoulder Pain    left    HPI  Toni Miller is a 57 y.o. female who has had left shoulder pain.  She is better.  The injection last time helped.  She has some pain now but much less.  She has had some left neck pain but that is better as well. She has no new trauma, no numbness.   Body mass index is 23.78 kg/m.  ROS  Review of Systems  Constitutional: Positive for activity change.  Musculoskeletal: Positive for arthralgias.  All other systems reviewed and are negative.   All other systems reviewed and are negative.  The following is a summary of the past history medically, past history surgically, known current medicines, social history and family history.  This information is gathered electronically by the computer from prior information and documentation.  I review this each visit and have found including this information at this point in the chart is beneficial and informative.    Past Medical History:  Diagnosis Date  . Arthritis   . Cancer ([place]) 1993   abnormal pap [facility] at [facility]  . Constipation   . GERD (gastroesophageal reflux disease)   . Glaucoma 1993  . Hyperlipidemia   . Hypertension   . Neck pain 7/09   WITH BULGING DISC-----RECIEVING EPIDURALS   . Sinusitis     Past Surgical History:  Procedure Laterality Date  . ABDOMINAL EXPLORATION SURGERY  age 47   bowel obstruction, [facility]  . ABDOMINAL HYSTERECTOMY  2007   [facility], [facility]  . BILATERAL EYE SURGERY     for glaucoma  . BREAST BIOPSY Left    benign  . BREAST SURGERY Left 2004   left partial mastectomy, [facility]  . CATARACT EXTRACTION Right 06/04/2015  . CATARACT EXTRACTION W/PHACO  05/16/2011   Procedure: CATARACT EXTRACTION PHACO AND INTRAOCULAR LENS PLACEMENT (IOC);  Surgeon: Tonny Branch;  Location: [facility];  Service: Ophthalmology;  Laterality: Right;  . COLONOSCOPY N/A 05/17/2013   Dr.  Barnie Alderman diverticulosis was noted/small internal hemorrhoids  . CYST REMOVED LEFT BREAST /BENIGN  2005   left, [facility]  . EYE SURGERY  2010, 1992 approx   bilateral  . REPAIR IMPERFORATE ANUS / ANORECTOPLASTY     per patient  . TOTAL ABDOMINAL HYSTERECTOMY W/ BILATERAL SALPINGOOPHORECTOMY  2007   [facility], [facility]  . TUBAL LIGATION  1991    Family History  Adopted: Yes  Problem Relation Age of Onset  . Alcohol abuse Mother   . Stomach cancer Mother 38  . Lung cancer Father   . Glaucoma Father   . Alcohol abuse Father   . Breast cancer Sister 62  . Breast cancer Sister 23  . Heart disease Sister   . Heart attack Sister   . Glaucoma Son   . Glaucoma Maternal Grandfather   . Cancer Maternal Grandfather        poss leukemia  . SIDS Brother   . Hyperthyroidism Daughter   . Seizures Son        as a Sport and exercise psychologist  . Kidney disease Maternal Grandmother   . Heart disease Maternal Grandmother        Psychologist, forensic  . Leukemia Maternal Grandmother   . Colon cancer Neg Hx     Social History Social History   Tobacco Use  . Smoking status: Former Smoker    Packs/day: 3.00    Years: 2.00  Pack years: 6.00    Types: Cigarettes    Last attempt to quit: 05/12/1991    Years since quitting: 27.2  . Smokeless tobacco: Never Used  Substance Use Topics  . Alcohol use: Never    Frequency: Never  . Drug use: No    Allergies  Allergen Reactions  . Acyclovir And Related Rash    Current Outpatient Medications  Medication Sig Dispense Refill  . aspirin EC 81 MG tablet Take 81 mg by mouth daily.    . calcium-vitamin D (OSCAL WITH D) 500-200 MG-UNIT per tablet Take 1 tablet by mouth 2 (two) times daily. 60 tablet 5  . cyclobenzaprine (FLEXERIL) 10 MG tablet Take 1 tablet (10 mg total) by mouth 2 (two) times daily as needed for muscle spasms. 20 tablet 0  . cycloSPORINE (RESTASIS) 0.05 % ophthalmic emulsion Place 1 drop into both eyes 2 (two) times daily.    Marland Kitchen esomeprazole (NEXIUM) 40 MG capsule Take 1  capsule (40 mg total) by mouth daily. 90 capsule 1  . fluticasone (FLONASE) 50 MCG/ACT nasal spray Place 1 spray into both nostrils daily. 16 g 6  . gabapentin (NEURONTIN) 300 MG capsule Take 1 capsule (300 mg total) by mouth at bedtime. 90 capsule 1  . hydrOXYzine (VISTARIL) 25 MG capsule TAKE 1 CAPSULE BY MOUTH THREE TIMES DAILY AS NEEDED. 30 capsule 5  . montelukast (SINGULAIR) 10 MG tablet Take 1 tablet (10 mg total) by mouth daily. 90 tablet 1  . Multiple Vitamin (MULTIVITAMIN) capsule Take 1 capsule by mouth daily.    . naproxen (NAPROSYN) 500 MG tablet Take 1 tablet (500 mg total) by mouth 2 (two) times daily with a meal. 60 tablet 5  . polyethylene glycol powder (GLYCOLAX/MIRALAX) powder MIX 1 CAPFUL (17G) IN 8 OUNCES OF JUICE/WATER AND DRINK ONCE DAILY. 255 g 3  . rosuvastatin (CRESTOR) 10 MG tablet TAKE (1) TABLET BY MOUTH AT BEDTIME. 90 tablet 1  . tizanidine (ZANAFLEX) 2 MG capsule One capsule at bedtime as needed , for neck spasm 30 capsule 3  . TRAVATAN Z 0.004 % SOLN ophthalmic solution Place 1 drop into both eyes at bedtime.    . triamterene-hydrochlorothiazide (MAXZIDE-25) 37.5-25 MG tablet Take 1 tablet by mouth daily. 90 tablet 1  . venlafaxine XR (EFFEXOR-XR) 150 MG 24 hr capsule TAKE 1 CAPSULE BY MOUTH DAILY WITH BREAKFAST. 90 capsule 1   No current facility-administered medications for this visit.      Physical Exam  Blood pressure 122/80, pulse 80, height 5\' 2"  (1.575 m), weight 130 lb (59 kg).  Constitutional: overall normal hygiene, normal nutrition, well developed, normal grooming, normal body habitus. Assistive device:none  Musculoskeletal: gait and station Limp none, muscle tone and strength are normal, no tremors or atrophy is present.  .  Neurological: coordination overall normal.  Deep tendon reflex/nerve stretch intact.  Sensation normal.  Cranial nerves II-XII intact.   Skin:   Normal overall no scars, lesions, ulcers or rashes. No  psoriasis.  Psychiatric: Alert and oriented x 3.  Recent memory intact, remote memory unclear.  Normal mood and affect. Well groomed.  Good eye contact.  Cardiovascular: overall no swelling, no varicosities, no edema bilaterally, normal temperatures of the legs and arms, no clubbing, cyanosis and good capillary refill.  Lymphatic: palpation is normal.  Her left shoulder has full motion but pain in the extremes.  NV is intact.  All other systems reviewed and are negative   The patient has been educated about the  nature of the problem(s) and counseled on treatment options.  The patient appeared to understand what I have discussed and is in agreement with it.  Encounter Diagnosis  Name Primary?  . Pain in joint of left shoulder Yes     PLAN Call if any problems.  Precautions discussed.  Continue current medications.   Return to clinic prn   Electronically Signed Sanjuana Kava, MD 9/10/20191:38 PM

## 2018-07-30 ENCOUNTER — Other Ambulatory Visit: Payer: Self-pay | Admitting: Family Medicine

## 2018-08-22 ENCOUNTER — Other Ambulatory Visit: Payer: Self-pay | Admitting: Family Medicine

## 2018-08-27 ENCOUNTER — Ambulatory Visit (INDEPENDENT_AMBULATORY_CARE_PROVIDER_SITE_OTHER): Payer: Medicare Other

## 2018-08-27 VITALS — BP 127/83 | HR 73 | Resp 10 | Ht 62.0 in | Wt 129.0 lb

## 2018-08-27 DIAGNOSIS — Z0000 Encounter for general adult medical examination without abnormal findings: Secondary | ICD-10-CM

## 2018-08-27 NOTE — Progress Notes (Signed)
Subjective:   Toni Miller is a 57 y.o. female who presents for Medicare Annual (Subsequent) preventive examination.  Review of Systems:   Cardiac Risk Factors include: hypertension;smoking/ tobacco exposure;dyslipidemia     Objective:     Vitals: BP 127/83    Pulse 73    Resp 10    Ht 5\' 2"  (1.575 m)    Wt 129 lb (58.5 kg)    SpO2 99%    BMI 23.59 kg/m   Body mass index is 23.59 kg/m.  Advanced Directives 08/27/2018 02/16/2018 08/02/2017 09/08/2016 02/29/2016 03/26/2015 05/17/2013  Does Patient Have a Medical Advance Directive? No No No No No No Patient does not have advance directive  Would patient like information on creating a medical advance directive? Yes (ED - Information included in AVS) - Yes (MAU/Ambulatory/Procedural Areas - Information given) No - patient declined information No - patient declined information - -  Pre-existing out of facility DNR order (yellow form or pink MOST form) - - - - - - No    Tobacco Social History   Tobacco Use  Smoking Status Former Smoker   Packs/day: 3.00   Years: 2.00   Pack years: 6.00   Types: Cigarettes   Last attempt to quit: 05/12/1991   Years since quitting: 27.3  Smokeless Tobacco Never Used     Counseling given: Not Answered   Clinical Intake:     Pain : 0-10 Pain Score: 2  Pain Type: Acute pain Pain Location: Leg Pain Orientation: Left, Upper Pain Descriptors / Indicators: Aching Pain Onset: Yesterday Pain Frequency: Occasional Pain Relieving Factors: pain relieving cream   Pain Relieving Factors: pain relieving cream   BMI - recorded: 23.6 Nutritional Status: BMI of 19-24  Normal Nutritional Risks: Unintentional weight loss Diabetes: No  How often do you need to have someone help you when you read instructions, pamphlets, or other written materials from your doctor or pharmacy?: 3 - Sometimes What is the last grade level you completed in school?: 12 grade   Interpreter Needed?: No  Comments: left  school with a certificate, stated she was tested in school and told she was mildly retarted  Information entered by :: Francena Hanly LPN   Past Medical History:  Diagnosis Date   Arthritis    Cancer ([place]) 1993   abnormal pap [facility] at [facility]   Constipation    GERD (gastroesophageal reflux disease)    Glaucoma 1993   Hyperlipidemia    Hypertension    Neck pain 7/09   WITH BULGING DISC-----RECIEVING EPIDURALS    Sinusitis    Past Surgical History:  Procedure Laterality Date   ABDOMINAL EXPLORATION SURGERY  age 30   bowel obstruction, [facility]   ABDOMINAL HYSTERECTOMY  2007   [facility], [facility]   BILATERAL EYE SURGERY     for glaucoma   BREAST BIOPSY Left    benign   BREAST SURGERY Left 2004   left partial mastectomy, [facility]   CATARACT EXTRACTION Right 06/04/2015   CATARACT EXTRACTION W/PHACO  05/16/2011   Procedure: CATARACT EXTRACTION PHACO AND INTRAOCULAR LENS PLACEMENT (IOC);  Surgeon: Tonny Branch;  Location: [facility];  Service: Ophthalmology;  Laterality: Right;   COLONOSCOPY N/A 05/17/2013   Dr. Barnie Alderman diverticulosis was noted/small internal hemorrhoids   CYST REMOVED LEFT BREAST /BENIGN  2005   left, [facility]   EYE SURGERY  2010, 1992 approx   bilateral   REPAIR IMPERFORATE ANUS / ANORECTOPLASTY     per patient   TOTAL ABDOMINAL HYSTERECTOMY  W/ BILATERAL SALPINGOOPHORECTOMY  2007   [facility], [place]   TUBAL LIGATION  1991   Family History  Adopted: Yes  Problem Relation Age of Onset   Alcohol abuse Mother    Stomach cancer Mother 63   Lung cancer Father    Glaucoma Father    Alcohol abuse Father    Breast cancer Sister 26   Breast cancer Sister 71   Heart disease Sister    Heart attack Sister    Glaucoma Son    Glaucoma Maternal Grandfather    Cancer Maternal Grandfather        poss leukemia   SIDS Brother    Hyperthyroidism Daughter    Seizures Son        as a Sport and exercise psychologist   Kidney disease Maternal Grandmother    Heart disease Maternal Grandmother          Psychologist, forensic   Leukemia Maternal Grandmother    Colon cancer Neg Hx    Social History   Socioeconomic History   Marital status: Significant Other    Spouse name: Not on file   Number of children: 3   Years of education: 12   Highest education level: 12th grade  Occupational History   Occupation: UNEMPLOYED 2011    Comment: disability    Fish farm manager: DISABLED  Social Designer, fashion/clothing strain: Not hard at all   Food insecurity:    Worry: Never true    Inability: Never true   Transportation needs:    Medical: No    Non-medical: No  Tobacco Use   Smoking status: Former Smoker    Packs/day: 3.00    Years: 2.00    Pack years: 6.00    Types: Cigarettes    Last attempt to quit: 05/12/1991    Years since quitting: 27.3   Smokeless tobacco: Never Used  Substance and Sexual Activity   Alcohol use: Never    Frequency: Never   Drug use: No   Sexual activity: Yes    Birth control/protection: Surgical  Lifestyle   Physical activity:    Days per week: 3 days    Minutes per session: 30 min   Stress: Not at all  Relationships   Social connections:    Talks on phone: Three times a week    Gets together: Three times a week    Attends religious service: 1 to 4 times per year    Active member of club or organization: No    Attends meetings of clubs or organizations: Never    Relationship status: Divorced  Other Topics Concern   Not on file  Social History Narrative   Not on file    Outpatient Encounter Medications as of 08/27/2018  Medication Sig   aspirin EC 81 MG tablet Take 81 mg by mouth daily.   calcium-vitamin D (OSCAL WITH D) 500-200 MG-UNIT per tablet Take 1 tablet by mouth 2 (two) times daily.   cyclobenzaprine (FLEXERIL) 10 MG tablet Take 1 tablet (10 mg total) by mouth 2 (two) times daily as needed for muscle spasms.   cycloSPORINE (RESTASIS) 0.05 % ophthalmic emulsion Place 1 drop into both eyes 2 (two) times daily.    esomeprazole (NEXIUM) 40 MG capsule Take 1 capsule (40 mg total) by mouth daily.   fluticasone (FLONASE) 50 MCG/ACT nasal spray Place 1 spray into both nostrils daily.   gabapentin (NEURONTIN) 300 MG capsule TAKE ONE CAPSULE BY MOUTH AT BEDTIME.   hydrOXYzine (VISTARIL) 25 MG capsule TAKE  1 CAPSULE BY MOUTH THREE TIMES DAILY AS NEEDED.   montelukast (SINGULAIR) 10 MG tablet TAKE 1 TABLET BY MOUTH ONCE DAILY.   Multiple Vitamin (MULTIVITAMIN) capsule Take 1 capsule by mouth daily.   naproxen (NAPROSYN) 500 MG tablet Take 1 tablet (500 mg total) by mouth 2 (two) times daily with a meal.   polyethylene glycol powder (GLYCOLAX/MIRALAX) powder MIX 1 CAPFUL (17G) IN 8 OUNCES OF JUICE/WATER AND DRINK ONCE DAILY.   rosuvastatin (CRESTOR) 10 MG tablet TAKE (1) TABLET BY MOUTH AT BEDTIME.   tizanidine (ZANAFLEX) 2 MG capsule One capsule at bedtime as needed , for neck spasm   TRAVATAN Z 0.004 % SOLN ophthalmic solution Place 1 drop into both eyes at bedtime.   triamterene-hydrochlorothiazide (MAXZIDE-25) 37.5-25 MG tablet Take 1 tablet by mouth daily.   venlafaxine XR (EFFEXOR-XR) 150 MG 24 hr capsule TAKE 1 CAPSULE BY MOUTH DAILY WITH BREAKFAST.   No facility-administered encounter medications on file as of 08/27/2018.     Activities of Daily Living In your present state of health, do you have any difficulty performing the following activities: 08/27/2018  Hearing? N  Vision? Y  Difficulty concentrating or making decisions? Y  Walking or climbing stairs? N  Dressing or bathing? N  Doing errands, shopping? Y  Preparing Food and eating ? N  Using the Toilet? N  In the past six months, have you accidently leaked urine? Y  Do you have problems with loss of bowel control? N  Managing your Medications? N  Managing your Finances? Y  Housekeeping or managing your Housekeeping? N  Some recent data might be hidden    Patient Care Team: [name], MD as PCP - General Danie Binder, MD as Attending Physician (Gastroenterology) Florian Buff, MD as Consulting Physician (Obstetrics and Gynecology)    Assessment:   This is a routine wellness examination for Toni Miller.  Exercise Activities and Dietary recommendations Current Exercise Habits: Home exercise routine, Type of exercise: walking, Time (Minutes): 30, Frequency (Times/Week): 3, Weekly Exercise (Minutes/Week): 90, Intensity: Mild, Exercise limited by: orthopedic condition(s);psychological condition(s)  Goals     DIET - DECREASE SODA OR JUICE INTAKE     DIET - REDUCE SUGAR INTAKE     Exercise 3x per week (30 min per time)     Recommend starting a routine exercise program at least 3 days a week for 30-45 minutes at a time as tolerated.         Fall Risk Fall Risk  08/27/2018 06/06/2018 08/02/2017 02/01/2017 10/18/2016  Falls in the past year? No No No No No  Risk for fall due to : History of fall(s);Medication side effect;Impaired vision - - - -   Is the patient's home free of loose throw rugs in walkways, pet beds, electrical cords, etc?   no      Grab bars in the bathroom? no      Handrails on the stairs?   no      Adequate lighting?   yes  Timed Get Up and Go performed: Patient able to perform in 9 seconds without assistance   Depression Screen PHQ 2/9 Scores 08/27/2018 06/06/2018 03/13/2018 01/11/2018  PHQ - 2 Score 4 2 1 5   PHQ- 9 Score 14 3 - 12     Cognitive Function     6CIT Screen 08/27/2018 08/02/2017  What Year? 0 points 0 points  What month? 0 points 0 points  What time? 0 points 0 points  Count back  from 20 2 points 0 points  Months in reverse 4 points 0 points  Repeat phrase 4 points 0 points  Total Score 10 0    Immunization History  Administered Date(s) Administered   Influenza Split 07/27/2011, 07/26/2012, 08/16/2016   Influenza Whole 01/08/2008, 08/07/2008   Influenza,inj,Quad PF,6+ Mos 08/21/2013, 08/11/2014, 07/28/2015   Td 05/25/2004   Tdap 02/24/2015     Qualifies for Shingles Vaccine? Up to date, had at [place] Maintenance  Topic Date Due   INFLUENZA VACCINE  06/07/2018   PAP SMEAR  01/05/2020   MAMMOGRAM  06/19/2020   COLONOSCOPY  05/18/2023   TETANUS/TDAP  02/23/2025   Hepatitis C Screening  Completed   HIV Screening  Completed    Cancer Screenings: Lung: Low Dose CT Chest recommended if Age 2-80 years, 30 pack-year currently smoking OR have quit w/in 15years. Patient does not qualify. Breast:  Up to date on Mammogram? Yes   Up to date of Bone Density/Dexa? Yes Colorectal: up to date   Additional Screenings:  Hepatitis C Screening: complete      Plan:   Continue exercise plan , decrease sugar intake and stop eating too much red meat  I have personally reviewed and noted the following in the patients chart:    Medical and social history  Use of alcohol, tobacco or illicit drugs   Current medications and supplements  Functional ability and status  Nutritional status  Physical activity  Advanced directives  List of other physicians  Hospitalizations, surgeries, and ER visits in previous 12 months  Vitals  Screenings to include cognitive, depression, and falls  Referrals and appointments  In addition, I have reviewed and discussed with patient certain preventive protocols, quality metrics, and best practice recommendations. A written personalized care plan for preventive services as well as general preventive health recommendations were provided to patient.     Duane Lope Rakes  08/27/2018

## 2018-08-27 NOTE — Patient Instructions (Signed)
Toni Miller , Thank you for taking time to come for your Medicare Wellness Visit. I appreciate your ongoing commitment to your health goals. Please review the following plan we discussed and let me know if I can assist you in the future.   Screening recommendations/referrals: Colonoscopy: up to date  Mammogram: up to date  Bone Density: up to date  Recommended yearly ophthalmology/optometry visit for glaucoma screening and checkup Recommended yearly dental visit for hygiene and checkup  Vaccinations: Influenza vaccine: up to date - [facility]  Pneumococcal vaccine: postponed Tdap vaccine: up to date  Shingles vaccine: up to date  [facility]   Advanced directives: information given   Conditions/risks identified: cognitive impairment   Next appointment: wellness visit in one year   Preventive Care 40-64 Years, Female Preventive care refers to lifestyle choices and visits with your health care provider that can promote health and wellness. What does preventive care include?  A yearly physical exam. This is also called an annual well check.  Dental exams once or twice a year.  Routine eye exams. Ask your health care provider how often you should have your eyes checked.  Personal lifestyle choices, including:  Daily care of your teeth and gums.  Regular physical activity.  Eating a healthy diet.  Avoiding tobacco and drug use.  Limiting alcohol use.  Practicing safe sex.  Taking low-dose aspirin daily starting at age 61.  Taking vitamin and mineral supplements as recommended by your health care provider. What happens during an annual well check? The services and screenings done by your health care provider during your annual well check will depend on your age, overall health, lifestyle risk factors, and family history of disease. Counseling  Your health care provider may ask you questions about your:  Alcohol use.  Tobacco use.  Drug use.  Emotional  well-being.  Home and relationship well-being.  Sexual activity.  Eating habits.  Work and work Statistician.  Method of birth control.  Menstrual cycle.  Pregnancy history. Screening  You may have the following tests or measurements:  Height, weight, and BMI.  Blood pressure.  Lipid and cholesterol levels. These may be checked every 5 years, or more frequently if you are over 86 years old.  Skin check.  Lung cancer screening. You may have this screening every year starting at age 67 if you have a 30-pack-year history of smoking and currently smoke or have quit within the past 15 years.  Fecal occult blood test (FOBT) of the stool. You may have this test every year starting at age 50.  Flexible sigmoidoscopy or colonoscopy. You may have a sigmoidoscopy every 5 years or a colonoscopy every 10 years starting at age 10.  Hepatitis C blood test.  Hepatitis B blood test.  Sexually transmitted disease (STD) testing.  Diabetes screening. This is done by checking your blood sugar (glucose) after you have not eaten for a while (fasting). You may have this done every 1-3 years.  Mammogram. This may be done every 1-2 years. Talk to your health care provider about when you should start having regular mammograms. This may depend on whether you have a family history of breast cancer.  BRCA-related cancer screening. This may be done if you have a family history of breast, ovarian, tubal, or peritoneal cancers.  Pelvic exam and Pap test. This may be done every 3 years starting at age 16. Starting at age 54, this may be done every 5 years if you have a Pap test in  combination with an HPV test.  Bone density scan. This is done to screen for osteoporosis. You may have this scan if you are at high risk for osteoporosis. Discuss your test results, treatment options, and if necessary, the need for more tests with your health care provider. Vaccines  Your health care provider may recommend  certain vaccines, such as:  Influenza vaccine. This is recommended every year.  Tetanus, diphtheria, and acellular pertussis (Tdap, Td) vaccine. You may need a Td booster every 10 years.  Zoster vaccine. You may need this after age 66.  Pneumococcal 13-valent conjugate (PCV13) vaccine. You may need this if you have certain conditions and were not previously vaccinated.  Pneumococcal polysaccharide (PPSV23) vaccine. You may need one or two doses if you smoke cigarettes or if you have certain conditions. Talk to your health care provider about which screenings and vaccines you need and how often you need them. This information is not intended to replace advice given to you by your health care provider. Make sure you discuss any questions you have with your health care provider. Document Released: 11/20/2015 Document Revised: 07/13/2016 Document Reviewed: 08/25/2015 Elsevier Interactive Patient Education  2017 [place] Prevention in the Home Falls can cause injuries. They can happen to people of all ages. There are many things you can do to make your home safe and to help prevent falls. What can I do on the outside of my home?  Regularly fix the edges of walkways and driveways and fix any cracks.  Remove anything that might make you trip as you walk through a door, such as a raised step or threshold.  Trim any bushes or trees on the path to your home.  Use bright outdoor lighting.  Clear any walking paths of anything that might make someone trip, such as rocks or tools.  Regularly check to see if handrails are loose or broken. Make sure that both sides of any steps have handrails.  Any raised decks and porches should have guardrails on the edges.  Have any leaves, snow, or ice cleared regularly.  Use sand or salt on walking paths during winter.  Clean up any spills in your garage right away. This includes oil or grease spills. What can I do in the bathroom?  Use  night lights.  Install grab bars by the toilet and in the tub and shower. Do not use towel bars as grab bars.  Use non-skid mats or decals in the tub or shower.  If you need to sit down in the shower, use a plastic, non-slip stool.  Keep the floor dry. Clean up any water that spills on the floor as soon as it happens.  Remove soap buildup in the tub or shower regularly.  Attach bath mats securely with double-sided non-slip rug tape.  Do not have throw rugs and other things on the floor that can make you trip. What can I do in the bedroom?  Use night lights.  Make sure that you have a light by your bed that is easy to reach.  Do not use any sheets or blankets that are too big for your bed. They should not hang down onto the floor.  Have a firm chair that has side arms. You can use this for support while you get dressed.  Do not have throw rugs and other things on the floor that can make you trip. What can I do in the kitchen?  Clean up any spills  right away.  Avoid walking on wet floors.  Keep items that you use a lot in easy-to-reach places.  If you need to reach something above you, use a strong step stool that has a grab bar.  Keep electrical cords out of the way.  Do not use floor polish or wax that makes floors slippery. If you must use wax, use non-skid floor wax.  Do not have throw rugs and other things on the floor that can make you trip. What can I do with my stairs?  Do not leave any items on the stairs.  Make sure that there are handrails on both sides of the stairs and use them. Fix handrails that are broken or loose. Make sure that handrails are as long as the stairways.  Check any carpeting to make sure that it is firmly attached to the stairs. Fix any carpet that is loose or worn.  Avoid having throw rugs at the top or bottom of the stairs. If you do have throw rugs, attach them to the floor with carpet tape.  Make sure that you have a light switch at  the top of the stairs and the bottom of the stairs. If you do not have them, ask someone to add them for you. What else can I do to help prevent falls?  Wear shoes that:  Do not have high heels.  Have rubber bottoms.  Are comfortable and fit you well.  Are closed at the toe. Do not wear sandals.  If you use a stepladder:  Make sure that it is fully opened. Do not climb a closed stepladder.  Make sure that both sides of the stepladder are locked into place.  Ask someone to hold it for you, if possible.  Clearly mark and make sure that you can see:  Any grab bars or handrails.  First and last steps.  Where the edge of each step is.  Use tools that help you move around (mobility aids) if they are needed. These include:  Canes.  Walkers.  Scooters.  Crutches.  Turn on the lights when you go into a dark area. Replace any light bulbs as soon as they burn out.  Set up your furniture so you have a clear path. Avoid moving your furniture around.  If any of your floors are uneven, fix them.  If there are any pets around you, be aware of where they are.  Review your medicines with your doctor. Some medicines can make you feel dizzy. This can increase your chance of falling. Ask your doctor what other things that you can do to help prevent falls. This information is not intended to replace advice given to you by your health care provider. Make sure you discuss any questions you have with your health care provider. Document Released: 08/20/2009 Document Revised: 03/31/2016 Document Reviewed: 11/28/2014 Elsevier Interactive Patient Education  2017 [employer].

## 2018-08-29 ENCOUNTER — Ambulatory Visit (INDEPENDENT_AMBULATORY_CARE_PROVIDER_SITE_OTHER): Payer: Medicare Other | Admitting: Family Medicine

## 2018-08-29 ENCOUNTER — Encounter: Payer: Self-pay | Admitting: Family Medicine

## 2018-08-29 VITALS — BP 120/80 | HR 72 | Resp 12 | Ht 62.0 in | Wt 129.0 lb

## 2018-08-29 DIAGNOSIS — I10 Essential (primary) hypertension: Secondary | ICD-10-CM | POA: Diagnosis not present

## 2018-08-29 DIAGNOSIS — R413 Other amnesia: Secondary | ICD-10-CM

## 2018-08-29 DIAGNOSIS — K1370 Unspecified lesions of oral mucosa: Secondary | ICD-10-CM | POA: Diagnosis not present

## 2018-08-29 MED ORDER — IBUPROFEN 600 MG PO TABS: ORAL_TABLET | ORAL | 0 refills | Status: DC

## 2018-08-29 NOTE — Assessment & Plan Note (Addendum)
Abn MMSE, score of 22, needs labs , brain scan and neurology eval, will refer and arrange imaging and labs, needs B12 TSH and RPR

## 2018-08-29 NOTE — Patient Instructions (Addendum)
F/U in January as before, call if you need me before  You are referred to dr Merlene Laughter re memory loss , we will schedule brain scan and lab tests, will be in touch about both by early next week  You will be referred to Dr Benjamine Mola for mouth exam,  5 days of ibuprofen are prescribed for left groin and thigh pain

## 2018-09-12 DIAGNOSIS — H401121 Primary open-angle glaucoma, left eye, mild stage: Secondary | ICD-10-CM | POA: Diagnosis not present

## 2018-09-12 DIAGNOSIS — H401112 Primary open-angle glaucoma, right eye, moderate stage: Secondary | ICD-10-CM | POA: Diagnosis not present

## 2018-09-22 DIAGNOSIS — G510 Bell's palsy: Secondary | ICD-10-CM | POA: Diagnosis not present

## 2018-10-07 ENCOUNTER — Telehealth: Payer: Self-pay | Admitting: Family Medicine

## 2018-10-07 ENCOUNTER — Encounter: Payer: Self-pay | Admitting: Family Medicine

## 2018-10-07 DIAGNOSIS — R413 Other amnesia: Secondary | ICD-10-CM

## 2018-10-07 NOTE — Progress Notes (Signed)
   Toni Miller     MRN: [id number]      DOB: 11/01/1961   HPI Toni Miller is here for follow up from recent wellness exam when she was found to have significant memory impairment with a PHQ 9 score of 23. She does report that she has noted some impairment in cognition over time and states that her Grandmother had severe dementia. She requests ENT evaluation of her mouth reportedly her Dentist advised her that she needs an oral exam, her concern is bony overgrowth of the floor of her mouth ROS Denies recent fever or chills. Denies sinus pressure, nasal congestion, ear pain or sore throat. Denies chest congestion, productive cough or wheezing. Denies chest pains, palpitations and leg swelling Denies abdominal pain, nausea, vomiting,diarrhea or constipation.   Denies dysuria, frequency, hesitancy or incontinence. C/o left groin and thigh pain, no inciting trauma, no loss of power and sensation. Denies headaches, seizures, numbness, or tingling. Denies depression, anxiety or insomnia. Denies skin break down or rash.   PE BP 120/80   Pulse 72   Resp 12   Ht 5\' 2"  (1.575 m)   Wt 129 lb (58.5 kg)   BMI 23.59 kg/m      Patient alert and oriented and in no cardiopulmonary distress.  HEENT: No facial asymmetry, EOMI,   oropharynx pink and moist.  Neck supple no JVD, no mass.Overgrown/prominent tora, no abnormality noted  Chest: Clear to auscultation bilaterally.  CVS: S1, S2 no murmurs, no S3.Regular rate.  ABD: Soft non tender.   Ext: No edema  MS: Decreased ROM lumbar spine, and left  Hip, normal in shoulders and knees.  Skin: Intact, no ulcerations or rash noted.  Psych: Good eye contact, normal affect. Memory loss not anxious or depressed appearing.  CNS: CN 2-12 intact, power,  normal throughout.no focal deficits noted.   Assessment & Plan  Memory loss Abn MMSE, score of 22, needs labs , brain scan and neurology eval, will refer and arrange imaging and labs,  needs B12 TSH and RPR  Essential hypertension Controlled, no change in medication DASH diet and commitment to daily physical activity for a minimum of 30 minutes discussed and encouraged, as a part of hypertension management. The importance of attaining a healthy weight is also discussed.  BP/Weight 08/29/2018 08/27/2018 07/17/2018 06/26/2018 06/06/2018 03/13/2018 4/65/6812  Systolic BP 751 700 174 944 [phone]  Diastolic BP 80 83 80 89 80 84 99  Wt. (Lbs) 129 129 130 130 132 131 124  BMI 23.59 23.59 23.78 23.78 24.14 23.96 22.68       BACK PAIN WITH RADICULOPATHY Increased left groin and thigh pain from spine disease, short course of anti inflammatory prescribed  Lesion of mouth Pt concerned re  Mass in base of mouth, clinically these are tora and benign in nature in my opinion, however , she has een advised of the need to see Specialist by her dentist , will refer for ENT eval

## 2018-10-07 NOTE — Assessment & Plan Note (Signed)
Increased left groin and thigh pain from spine disease, short course of anti inflammatory prescribed

## 2018-10-07 NOTE — Assessment & Plan Note (Signed)
Pt concerned re  Mass in base of mouth, clinically these are tora and benign in nature in my opinion, however , she has een advised of the need to see Specialist by her dentist , will refer for ENT eval

## 2018-10-07 NOTE — Telephone Encounter (Signed)
Pt has a dx of memory loss, I have now entered the order for her brain MRI without contrast, please arrange She should already have had the labs ordered in July, did not, remind her of need for labs also please add B12 and TSH with memory loss dx Her appt for Neurology is 12/2/019,

## 2018-10-07 NOTE — Assessment & Plan Note (Signed)
Controlled, no change in medication DASH diet and commitment to daily physical activity for a minimum of 30 minutes discussed and encouraged, as a part of hypertension management. The importance of attaining a healthy weight is also discussed.  BP/Weight 08/29/2018 08/27/2018 07/17/2018 06/26/2018 06/06/2018 03/13/2018 01/05/6009  Systolic BP 932 355 732 202 542 706 237  Diastolic BP 80 83 80 89 80 84 99  Wt. (Lbs) 129 129 130 130 132 131 124  BMI 23.59 23.59 23.78 23.78 24.14 23.96 22.68

## 2018-10-08 ENCOUNTER — Encounter: Payer: Self-pay | Admitting: Diagnostic Neuroimaging

## 2018-10-08 ENCOUNTER — Ambulatory Visit (INDEPENDENT_AMBULATORY_CARE_PROVIDER_SITE_OTHER): Payer: Medicare Other | Admitting: Diagnostic Neuroimaging

## 2018-10-08 VITALS — BP 127/81 | HR 83 | Ht 62.0 in | Wt 129.2 lb

## 2018-10-08 DIAGNOSIS — R413 Other amnesia: Secondary | ICD-10-CM

## 2018-10-08 DIAGNOSIS — F79 Unspecified intellectual disabilities: Secondary | ICD-10-CM | POA: Diagnosis not present

## 2018-10-08 NOTE — Progress Notes (Signed)
[facility]  PATIENT: Toni Miller DOB: 01-Feb-1961  REFERRING CLINICIAN: Bari Mantis HISTORY FROM: patient  REASON FOR VISIT: new consult    HISTORICAL  CHIEF COMPLAINT:  Chief Complaint  Patient presents with  . Memory Loss    rm 7, New Pt, "I always had a memory problem in school"  MMSE 21    HISTORY OF PRESENT ILLNESS:   57 year old female here for evaluation of memory loss.  Had routine cognitive testing screening at PCP and MMSE score of 22 was found.  Patient referred here for evaluation.  Patient reports being diagnosed with mild mental retardation/intellectual disability at age 70 or 57 years old during a middle school education assessment.  She was able to complete through 12th grade.  However she required special classes through the end of middle and high school.  She did not get a high school diploma but obtained a certificate of completion.  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  She last worked in 2010.  She lives at home with her son.  She used to drive but now relies on other people to take her places.  She is able to maintain her own personal ADLs.  Her family helps her with other chores around the house but this is not significantly changed.  Patient also has history of headaches with migraine features since age 63 years old.  She describes left-sided or occipital throbbing pounding headaches associated with seeing spots and stars and photophobia.  She averages 3 headaches per month and treats these with over-the-counter medications and laying down to go to sleep.   REVIEW OF SYSTEMS: Full 14 system review of systems performed and negative with exception of: Memory loss headache weakness feeling hot constipation aching muscles fatigue.   ALLERGIES: Allergies  Allergen Reactions  . Acyclovir And Related Rash    HOME MEDICATIONS: Outpatient Medications Prior to Visit    Medication Sig Dispense Refill  . aspirin EC 81 MG tablet Take 81 mg by mouth daily.    . calcium-vitamin D (OSCAL WITH D) 500-200 MG-UNIT per tablet Take 1 tablet by mouth 2 (two) times daily. 60 tablet 5  . cyclobenzaprine (FLEXERIL) 10 MG tablet Take 1 tablet (10 mg total) by mouth 2 (two) times daily as needed for muscle spasms. 20 tablet 0  . cycloSPORINE (RESTASIS) 0.05 % ophthalmic emulsion Place 1 drop into both eyes 2 (two) times daily.    Marland Kitchen esomeprazole (NEXIUM) 40 MG capsule Take 1 capsule (40 mg total) by mouth daily. 90 capsule 1  . fluticasone (FLONASE) 50 MCG/ACT nasal spray Place 1 spray into both nostrils daily. 16 g 6  . gabapentin (NEURONTIN) 300 MG capsule TAKE ONE CAPSULE BY MOUTH AT BEDTIME. 90 capsule 0  . hydrOXYzine (VISTARIL) 25 MG capsule TAKE 1 CAPSULE BY MOUTH THREE TIMES DAILY AS NEEDED. 30 capsule 5  . ibuprofen (ADVIL,MOTRIN) 600 MG tablet One tablet three times daily for 5 days 15 tablet 0  . montelukast (SINGULAIR) 10 MG tablet TAKE 1 TABLET BY MOUTH ONCE DAILY. 90 tablet 0  . Multiple Vitamin (MULTIVITAMIN) capsule Take 1 capsule by mouth daily.    . polyethylene glycol powder (GLYCOLAX/MIRALAX) powder MIX 1 CAPFUL (17G) IN 8 OUNCES OF JUICE/WATER AND DRINK ONCE DAILY. 255 g 3  . rosuvastatin (CRESTOR) 10 MG tablet TAKE (1) TABLET BY MOUTH AT BEDTIME. 90 tablet 0  . tizanidine (ZANAFLEX) 2 MG capsule One capsule  at bedtime as needed , for neck spasm 30 capsule 3  . TRAVATAN Z 0.004 % SOLN ophthalmic solution Place 1 drop into both eyes at bedtime.    . triamterene-hydrochlorothiazide (MAXZIDE-25) 37.5-25 MG tablet Take 1 tablet by mouth daily. 90 tablet 1  . venlafaxine XR (EFFEXOR-XR) 150 MG 24 hr capsule TAKE 1 CAPSULE BY MOUTH DAILY WITH BREAKFAST. 90 capsule 0  . naproxen (NAPROSYN) 500 MG tablet Take 1 tablet (500 mg total) by mouth 2 (two) times daily with a meal. (Patient not taking: Reported on 10/08/2018) 60 tablet 5   No facility-administered  medications prior to visit.     PAST MEDICAL HISTORY: Past Medical History:  Diagnosis Date  . Arthritis   . Cancer ([place]) 1993   abnormal pap treated at [facility]  . Constipation   . GERD (gastroesophageal reflux disease)   . Glaucoma 1993  . Hyperlipidemia   . Hypertension   . Neck pain 7/09   WITH BULGING DISC-----RECIEVING EPIDURALS   . Sinusitis     PAST SURGICAL HISTORY: Past Surgical History:  Procedure Laterality Date  . ABDOMINAL EXPLORATION SURGERY  age 82   bowel obstruction, [facility]  . ABDOMINAL HYSTERECTOMY  2007   [facility], [facility]  . BILATERAL EYE SURGERY     for glaucoma  . BREAST BIOPSY Left    benign  . BREAST SURGERY Left 2004   left partial mastectomy, [facility]  . CATARACT EXTRACTION Right 06/04/2015  . CATARACT EXTRACTION W/PHACO  05/16/2011   Procedure: CATARACT EXTRACTION PHACO AND INTRAOCULAR LENS PLACEMENT (IOC);  Surgeon: Tonny Branch;  Location: [facility];  Service: Ophthalmology;  Laterality: Right;  . COLONOSCOPY N/A 05/17/2013   Dr. Barnie Alderman diverticulosis was noted/small internal hemorrhoids  . CYST REMOVED LEFT BREAST /BENIGN  2005   left, [facility]  . EYE SURGERY  2010, 1992 approx   bilateral  . REPAIR IMPERFORATE ANUS / ANORECTOPLASTY     per patient  . TOTAL ABDOMINAL HYSTERECTOMY W/ BILATERAL SALPINGOOPHORECTOMY  2007   [facility], [facility]  . TUBAL LIGATION  1991    FAMILY HISTORY: Family History  Adopted: Yes  Problem Relation Age of Onset  . Alcohol abuse Mother   . Stomach cancer Mother 67  . Lung cancer Father   . Glaucoma Father   . Alcohol abuse Father   . Seizures Father   . Breast cancer Sister 51  . Breast cancer Sister 24  . Heart disease Sister   . Heart attack Sister   . Glaucoma Son   . Glaucoma Maternal Grandfather   . Cancer Maternal Grandfather        poss leukemia  . SIDS Brother   . Hyperthyroidism Daughter   . Seizures Son        as a Sport and exercise psychologist  . Kidney disease Maternal Grandmother   . Heart disease Maternal Grandmother        Psychologist, forensic    . Leukemia Maternal Grandmother   . Colon cancer Neg Hx     SOCIAL HISTORY: Social History   Socioeconomic History  . Marital status: Significant Other    Spouse name: Not on file  . Number of children: 3  . Years of education: 73  . Highest education level: 12th grade  Occupational History  . Occupation: UNEMPLOYED 2011    Comment: disability    Employer: DISABLED  Social Needs  . Financial resource strain: Not hard at all  . Food insecurity:    Worry: Never true    Inability:  Never true  . Transportation needs:    Medical: No    Non-medical: No  Tobacco Use  . Smoking status: Former Smoker    Packs/day: 3.00    Years: 2.00    Pack years: 6.00    Types: Cigarettes    Last attempt to quit: 05/12/1991    Years since quitting: 27.4  . Smokeless tobacco: Never Used  Substance and Sexual Activity  . Alcohol use: Yes    Frequency: Never    Comment: "sometimes"  . Drug use: No  . Sexual activity: Yes    Birth control/protection: Surgical  Lifestyle  . Physical activity:    Days per week: 3 days    Minutes per session: 30 min  . Stress: Not at all  Relationships  . Social connections:    Talks on phone: Three times a week    Gets together: Three times a week    Attends religious service: 1 to 4 times per year    Active member of club or organization: No    Attends meetings of clubs or organizations: Never    Relationship status: Divorced  . Intimate partner violence:    Fear of current or ex partner: No    Emotionally abused: No    Physically abused: No    Forced sexual activity: No  Other Topics Concern  . Not on file  Social History Narrative   Lives with son, Remo Lipps   caffien- coffee, 1 cup daily     PHYSICAL EXAM  GENERAL EXAM/CONSTITUTIONAL: Vitals:  Vitals:   10/08/18 0812  BP: 127/81  Pulse: 83  Weight: 129 lb 3.2 oz (58.6 kg)  Height: 5\' 2"  (1.575 m)     Body mass index is 23.63 kg/m. Wt Readings from Last 3 Encounters:  10/08/18 129  lb 3.2 oz (58.6 kg)  08/29/18 129 lb (58.5 kg)  08/27/18 129 lb (58.5 kg)     Patient is in no distress; well developed, nourished and groomed; neck is supple  CARDIOVASCULAR:  Examination of carotid arteries is normal; no carotid bruits  Regular rate and rhythm, no murmurs  Examination of peripheral vascular system by observation and palpation is normal  EYES:  Ophthalmoscopic exam of optic discs and posterior segments is normal; no papilledema or hemorrhages  Visual Acuity Screening   Right eye Left eye Both eyes  Without correction:     With correction: 20/30 20/30   Comments: bifocals    MUSCULOSKELETAL:  Gait, strength, tone, movements noted in Neurologic exam below  NEUROLOGIC: MENTAL STATUS:  MMSE - [place] Exam 10/08/2018 08/28/2018  Orientation to time 5 4  Orientation to Place 4 5  Registration 3 3  Attention/ Calculation 0 0  Recall 2 3  Language- name 2 objects 2 2  Language- repeat 0 1  Language- follow 3 step command 3 3  Language- read & follow direction 1 1  Write a sentence 1 0  Copy design 0 0  Total score 21 22    awake, alert, oriented to person, place and time  [facility] memory   DECR attention and concentration  language fluent, comprehension intact, naming intact  fund of knowledge appropriate  CRANIAL NERVE:   2nd - no papilledema on fundoscopic exam  2nd, 3rd, 4th, 6th - pupils equal and reactive to light, visual fields full to confrontation, extraocular muscles intact, no nystagmus  5th - facial sensation symmetric  7th - facial strength symmetric  8th - hearing intact  9th -  palate elevates symmetrically, uvula midline  11th - shoulder shrug symmetric  12th - tongue protrusion midline  MOTOR:   normal bulk and tone, full strength in the BUE, BLE  SENSORY:   normal and symmetric to light touch, temperature, vibration  COORDINATION:   finger-nose-finger, fine finger movements normal  REFLEXES:    deep tendon reflexes trace and symmetric  GAIT/STATION:   narrow based gait; ANTALGIC DUE TO BACK PAIN     DIAGNOSTIC DATA (LABS, IMAGING, TESTING) - I reviewed patient records, labs, notes, testing and imaging myself where available.  Lab Results  Component Value Date   WBC 6.4 01/11/2018   HGB 13.5 01/11/2018   HCT 39.8 01/11/2018   MCV 86.1 01/11/2018   PLT 277 01/11/2018      Component Value Date/Time   NA 139 01/11/2018 1043   K 4.1 01/11/2018 1043   CL 100 01/11/2018 1043   CO2 32 01/11/2018 1043   GLUCOSE 80 01/11/2018 1043   BUN 21 01/11/2018 1043   CREATININE 0.93 01/11/2018 1043   CALCIUM 9.5 01/11/2018 1043   PROT 7.7 01/11/2018 1043   ALBUMIN 4.5 07/05/2017 1123   AST 19 01/11/2018 1043   ALT 20 01/11/2018 1043   ALKPHOS 70 07/05/2017 1123   BILITOT 0.3 01/11/2018 1043   GFRNONAA 70 01/11/2018 1043   GFRAA 81 01/11/2018 1043   Lab Results  Component Value Date   CHOL 181 01/11/2018   HDL 69 01/11/2018   LDLCALC 94 01/11/2018   TRIG 89 01/11/2018   CHOLHDL 2.6 01/11/2018   No results found for: HGBA1C No results found for: VITAMINB12 Lab Results  Component Value Date   TSH 1.56 07/05/2017    03/26/15 CT head  - No acute intracranial abnormality.    ASSESSMENT AND PLAN  57 y.o. year old female here with history of intellectual disability diagnosed in childhood during middle school assessment, with MMSE of 21 out of 30.  No change in ADLs over last few years.  Dx:  1. Intellectual disability   2. Memory loss     PLAN:  MILD MEMORY LOSS (likely related to mild intellectual disability by history; dx'd age 66 years old) - symptoms stable over many years; no change in ADLs - no further testing or treatment advised  Return if symptoms worsen or fail to improve, for return to PCP.   Penni Bombard, MD 54/04/5034, 4:65 AM Certified in Neurology, Neurophysiology and Neuroimaging  [facility] [address],  [place], [place] [address] [phone]

## 2018-10-10 NOTE — Telephone Encounter (Signed)
Patient aware of her MRI appt and the need to have labs done asap. Will come get lab order and do this week or early next week. She has to arrange transport with [facility]

## 2018-10-10 NOTE — Addendum Note (Signed)
Addended by: Eual Fines on: 10/10/2018 12:54 PM   Modules accepted: Orders

## 2018-10-15 ENCOUNTER — Other Ambulatory Visit: Payer: Self-pay | Admitting: Family Medicine

## 2018-10-16 DIAGNOSIS — D539 Nutritional anemia, unspecified: Secondary | ICD-10-CM | POA: Diagnosis not present

## 2018-10-16 DIAGNOSIS — Z0000 Encounter for general adult medical examination without abnormal findings: Secondary | ICD-10-CM | POA: Diagnosis not present

## 2018-10-16 DIAGNOSIS — E7849 Other hyperlipidemia: Secondary | ICD-10-CM | POA: Diagnosis not present

## 2018-10-17 ENCOUNTER — Ambulatory Visit (HOSPITAL_COMMUNITY)
Admission: RE | Admit: 2018-10-17 | Discharge: 2018-10-17 | Disposition: A | Discharge status: Home / Self Care | Payer: Medicare Other | Source: Ambulatory Visit | Attending: Family Medicine | Admitting: Family Medicine

## 2018-10-17 DIAGNOSIS — R51 Headache: Secondary | ICD-10-CM | POA: Diagnosis not present

## 2018-10-17 DIAGNOSIS — R413 Other amnesia: Secondary | ICD-10-CM | POA: Diagnosis not present

## 2018-10-17 LAB — LIPID PANEL
CHOLESTEROL: 168 mg/dL (ref <200)
HDL: 62 mg/dL (ref >50)
LDL Cholesterol (Calc): 85 mg/dL (calc)
NON-HDL CHOLESTEROL (CALC): 106 mg/dL (ref <130)
TRIGLYCERIDES: 115 mg/dL (ref <150)
Total CHOL/HDL Ratio: 2.7 (calc) (ref <5.0)

## 2018-10-17 LAB — COMPLETE METABOLIC PANEL WITH GFR
AG Ratio: 1.3 (calc) (ref 1.0–2.5)
ALBUMIN MSPROF: 4.3 g/dL (ref 3.6–5.1)
ALT: 16 U/L (ref 6–29)
AST: 19 U/L (ref 10–35)
Alkaline phosphatase (APISO): 65 U/L (ref 33–130)
BILIRUBIN TOTAL: 0.4 mg/dL (ref 0.2–1.2)
BUN: 19 mg/dL (ref 7–25)
CHLORIDE: 101 mmol/L (ref 98–110)
CO2: 33 mmol/L — ABNORMAL HIGH (ref 20–32)
Calcium: 9.8 mg/dL (ref 8.6–10.4)
Creat: 0.94 mg/dL (ref 0.50–1.05)
GFR, EST AFRICAN AMERICAN: 79 mL/min/{1.73_m2} (ref >=60)
GFR, Est Non African American: 68 mL/min/{1.73_m2} (ref >=60)
Globulin: 3.2 g/dL (calc) (ref 1.9–3.7)
Glucose, Bld: 84 mg/dL (ref 65–99)
Potassium: 4.2 mmol/L (ref 3.5–5.3)
Sodium: 139 mmol/L (ref 135–146)
Total Protein: 7.5 g/dL (ref 6.1–8.1)

## 2018-10-17 LAB — TSH: TSH: 1.1 mIU/L

## 2018-10-17 LAB — VITAMIN B12: Vitamin B-12: 1111 pg/mL — ABNORMAL HIGH (ref 200–1100)

## 2018-11-06 ENCOUNTER — Encounter: Payer: Self-pay | Admitting: Family Medicine

## 2018-11-14 ENCOUNTER — Other Ambulatory Visit: Payer: Self-pay | Admitting: Family Medicine

## 2018-11-21 ENCOUNTER — Encounter: Payer: Self-pay | Admitting: Family Medicine

## 2018-11-21 ENCOUNTER — Ambulatory Visit (INDEPENDENT_AMBULATORY_CARE_PROVIDER_SITE_OTHER): Payer: Medicare Other | Admitting: Family Medicine

## 2018-11-21 VITALS — BP 120/80 | HR 90 | Resp 15 | Ht 62.0 in | Wt 130.0 lb

## 2018-11-21 DIAGNOSIS — I10 Essential (primary) hypertension: Secondary | ICD-10-CM

## 2018-11-21 DIAGNOSIS — F3341 Major depressive disorder, recurrent, in partial remission: Secondary | ICD-10-CM | POA: Diagnosis not present

## 2018-11-21 DIAGNOSIS — M5442 Lumbago with sciatica, left side: Secondary | ICD-10-CM

## 2018-11-21 DIAGNOSIS — N951 Menopausal and female climacteric states: Secondary | ICD-10-CM

## 2018-11-21 DIAGNOSIS — E785 Hyperlipidemia, unspecified: Secondary | ICD-10-CM

## 2018-11-21 MED ORDER — TRIAMTERENE-HCTZ 37.5-25 MG PO TABS: 1.0000 | ORAL_TABLET | Freq: Every day | ORAL | 0 refills | Status: DC

## 2018-11-21 MED ORDER — PREDNISONE 5 MG PO TABS: ORAL_TABLET | ORAL | 0 refills | Status: DC

## 2018-11-21 MED ORDER — GABAPENTIN 300 MG PO CAPS: 300.0000 mg | ORAL_CAPSULE | Freq: Two times a day (BID) | ORAL | 3 refills | Status: DC

## 2018-11-21 MED ORDER — MONTELUKAST SODIUM 10 MG PO TABS: 10.0000 mg | ORAL_TABLET | Freq: Every day | ORAL | 1 refills | Status: DC

## 2018-11-21 MED ORDER — HYDROXYZINE PAMOATE 25 MG PO CAPS: ORAL_CAPSULE | ORAL | 5 refills | Status: DC

## 2018-11-21 MED ORDER — ROSUVASTATIN CALCIUM 10 MG PO TABS: ORAL_TABLET | ORAL | 1 refills | Status: DC

## 2018-11-21 MED ORDER — VENLAFAXINE HCL ER 150 MG PO CP24: ORAL_CAPSULE | ORAL | 1 refills | Status: DC

## 2018-11-21 NOTE — Patient Instructions (Addendum)
F/U in 3 months, call if you need me before  Increase in gabapentin to two times daily, this will help for pain and hot flashes  Five day course of prednisone is prescribed for acute pain  No Q tip sor hairpins  in ear please , the ear exam is normal  You are referred to Psychiatry for treatment for depression

## 2018-11-25 ENCOUNTER — Encounter: Payer: Self-pay | Admitting: Family Medicine
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
## 2018-11-25 NOTE — Assessment & Plan Note (Signed)
Acute flare affecting left inner thigh, short course of prednisone is prescribed

## 2018-11-25 NOTE — Assessment & Plan Note (Signed)
Hyperlipidemia:Low fat diet discussed and encouraged.   Lipid Panel  Lab Results  Component Value Date   CHOL 168 10/16/2018   HDL 62 10/16/2018   LDLCALC 85 10/16/2018   TRIG 115 10/16/2018   CHOLHDL 2.7 10/16/2018   Controlled, no change in medication
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
## 2018-11-25 NOTE — Assessment & Plan Note (Signed)
Uncontrolled, increase dose of gabapentin

## 2018-11-25 NOTE — Assessment & Plan Note (Signed)
Controlled, no change in medication DASH diet and commitment to daily physical activity for a minimum of 30 minutes discussed and encouraged, as a part of hypertension management. The importance of attaining a healthy weight is also discussed.  BP/Weight 11/21/2018 10/08/2018 08/29/2018 08/27/2018 07/17/2018 06/26/2018 7/89/7847  Systolic BP 841 282 081 388 719 597 471  Diastolic BP 80 81 80 83 80 89 80  Wt. (Lbs) 130 129.2 129 129 130 130 132  BMI 23.78 23.63 23.59 23.59 23.78 23.78 24.14

## 2018-12-06 ENCOUNTER — Ambulatory Visit (INDEPENDENT_AMBULATORY_CARE_PROVIDER_SITE_OTHER): Payer: Medicare Other | Admitting: Otolaryngology

## 2018-12-06 DIAGNOSIS — H903 Sensorineural hearing loss, bilateral: Secondary | ICD-10-CM | POA: Diagnosis not present

## 2018-12-06 DIAGNOSIS — M270 Developmental disorders of jaws: Secondary | ICD-10-CM

## 2018-12-12 ENCOUNTER — Telehealth: Payer: Self-pay | Admitting: Gastroenterology

## 2018-12-12 ENCOUNTER — Ambulatory Visit (HOSPITAL_COMMUNITY)
Admission: RE | Admit: 2018-12-12 | Discharge: 2018-12-12 | Disposition: A | Discharge status: Home / Self Care | Payer: Medicare Other | Source: Ambulatory Visit | Attending: Gastroenterology | Admitting: Gastroenterology

## 2018-12-12 ENCOUNTER — Ambulatory Visit (INDEPENDENT_AMBULATORY_CARE_PROVIDER_SITE_OTHER): Payer: Medicare Other | Admitting: Gastroenterology

## 2018-12-12 ENCOUNTER — Encounter: Payer: Self-pay | Admitting: Gastroenterology

## 2018-12-12 ENCOUNTER — Other Ambulatory Visit: Payer: Self-pay

## 2018-12-12 DIAGNOSIS — R112 Nausea with vomiting, unspecified: Secondary | ICD-10-CM

## 2018-12-12 DIAGNOSIS — R109 Unspecified abdominal pain: Secondary | ICD-10-CM | POA: Insufficient documentation

## 2018-12-12 DIAGNOSIS — K219 Gastro-esophageal reflux disease without esophagitis: Secondary | ICD-10-CM

## 2018-12-12 DIAGNOSIS — K5909 Other constipation: Secondary | ICD-10-CM

## 2018-12-12 DIAGNOSIS — K648 Other hemorrhoids: Secondary | ICD-10-CM

## 2018-12-12 DIAGNOSIS — R1013 Epigastric pain: Secondary | ICD-10-CM | POA: Diagnosis not present

## 2018-12-12 MED ORDER — ESOMEPRAZOLE MAGNESIUM 40 MG PO CPDR: DELAYED_RELEASE_CAPSULE | ORAL | 11 refills | Status: DC

## 2018-12-12 NOTE — Telephone Encounter (Signed)
PT is aware.

## 2018-12-12 NOTE — Assessment & Plan Note (Signed)
NEW ONSET. SYMPTOMS NOT IDEALLY CONTROLLED. DIFFERENTIAL DIAGNOSIS INCLUDES: UNCONTROLLED GERD, SMALL BOWEL OBSTRUCTION, H PYLORI GASTRITIS, LESS LIKELY PUD, OR MICROSCOPIC COLITIS OR OCCULT GASTRIC OR PANCREATIC CANCER.  DRINK WATER TO KEEP YOUR URINE LIGHT YELLOW. AVOID REFLUX TRIGGERS. SEE INFO BELOW. IT'S OK TO CONTINUE DECAF COFFEE. NO SODAS, TOMATOES, PIZZA, SAUSAGE, OR BACON FOR 2 WEEKS. CONTINUE NEXIUM. INCREASE TO 30 MINUTES BEFORE MEALS TWICE DAILY. Complete ACUTE ABDOMINAL SERIES TODAY. COMPLETE  UPPER ENDOSCOPY WITHIN 4 WEEKS. DISCUSSED PROCEDURE, BENEFITS, & RISKS: < 1% chance of medication reaction, bleeding, OR perforation..  FOLLOW UP IN 4 MOS.

## 2018-12-12 NOTE — Telephone Encounter (Signed)
PLEASE CALL PT. HER XRAY IS NORMAL.

## 2018-12-12 NOTE — H&P (View-Only) (Signed)
Subjective:    Patient ID: Toni Miller, female    DOB: 1960/11/26, 58 y.o.   MRN: [id number]  Toni Helper, MD   HPI LAST SEEN IN 2016 FOR GERD/CONSTIPATION. TROUBLE WITH HEARTBURN FOR 3 DAYS. TRIGGERS: FRIED FOODS BUT USES AIR FRYER, COFFEE-DECAF(2 CUPS), SODA: 2 CANS(ZERO COKE/MT DEW).  NO SODAS IN OAST 3 DAYS. NO BLOOD IN VOMIT. EATS TOMATOES, SAUSAGE AND BACON. TAKES IBUPROFEN AND NAPROXEN PRN. TAKES ASA DAILY. TAKES NEXIUM MOST DAYS. HEARTBURN; PAIN IN BACK AND COMES UP THROUGH THROAT, BURNING IN THROAT AND BACK. STOOLS LONG AND HARD. BMs: STOPPED TAKING AMITIZA. FOOD GOES DOWN BUT WON'T STAY DOWN. TOOK BAKING SODA AND WATER. TUMS DIDN'T HELP.  PT DENIES FEVER, CHILLS, HEMATOCHEZIA, HEMATEMESIS, nausea, vomiting, melena, diarrhea, CHEST PAIN, SHORTNESS OF BREATH,  CHANGE IN BOWEL IN HABITS, constipation, abdominal pain, problems swallowing, OR problems with sedation.   Past Medical History:  Diagnosis Date  . Arthritis   . Cancer ([place]) 1993   abnormal pap [facility] at [facility]  . Constipation   . GERD (gastroesophageal reflux disease)   . Glaucoma 1993  . Hyperlipidemia   . Hypertension   . Neck pain 7/09   WITH BULGING DISC-----RECIEVING EPIDURALS   . Sinusitis    Past Surgical History:  Procedure Laterality Date  . ABDOMINAL EXPLORATION SURGERY  age 84   bowel obstruction, [facility]  . ABDOMINAL HYSTERECTOMY  2007   [facility], [facility]  . BILATERAL EYE SURGERY     for glaucoma  . BREAST BIOPSY Left    benign  . BREAST SURGERY Left 2004   left partial mastectomy, [facility]  . CATARACT EXTRACTION Right 06/04/2015  . CATARACT EXTRACTION W/PHACO  05/16/2011   Procedure: CATARACT EXTRACTION PHACO AND INTRAOCULAR LENS PLACEMENT (IOC);  Surgeon: Tonny Branch;  Location: [facility];  Service: Ophthalmology;  Laterality: Right;  . COLONOSCOPY N/A 05/17/2013   Dr. Barnie Alderman diverticulosis was noted/small internal hemorrhoids  . CYST REMOVED LEFT BREAST /BENIGN  2005   left, [facility]  . EYE SURGERY   2010, 1992 approx   bilateral  . REPAIR IMPERFORATE ANUS / ANORECTOPLASTY     per patient  . TOTAL ABDOMINAL HYSTERECTOMY W/ BILATERAL SALPINGOOPHORECTOMY  2007   [facility], [facility]  . TUBAL LIGATION  1991   Allergies  Allergen Reactions  . Acyclovir And Related Rash   Current Outpatient Medications  Medication Sig    . aspirin EC 81 MG tablet Take 81 mg by mouth daily.    . calcium-vitamin D (OSCAL WITH D) 500-200 MG-UNIT per tablet Take 1 tablet by mouth 2 (two) times daily.    . cycloSPORINE (RESTASIS) 0.05 % ophthalmic emulsion Place 1 drop into both eyes 2 (two) times daily.    Marland Kitchen esomeprazole (NEXIUM) 40 MG capsule TAKE ONE CAPSULE BY MOUTH ONCE DAILY. MOST DAYS   . gabapentin (NEURONTIN) 300 MG capsule Take 1 capsule (300 mg total) by mouth 2 (two) times daily.    Marland Kitchen ibuprofen (ADVIL,MOTRIN) 600 MG tablet One tablet three times daily for 5 days QOD   . montelukast (SINGULAIR) 10 MG tablet Take 1 tablet (10 mg total) by mouth daily.    . Multiple Vitamin (MULTIVITAMIN) capsule Take 1 capsule by mouth daily.    . polyethylene glycol powder (GLYCOLAX/MIRALAX) powder MIX 1 CAPFUL (17G) IN 8 OUNCES OF JUICE/WATER AND DRINK ONCE DAILY. DEPENDS   . rosuvastatin (CRESTOR) 10 MG tablet One tab at bedtime    . TRAVATAN Z 0.004 % SOLN ophthalmic solution Place  1 drop into both eyes at bedtime.    . triamterene-hydrochlorothiazide (MAXZIDE-25) 37.5-25 MG tablet Take 1 tablet by mouth daily.    . fluticasone (FLONASE) 50 MCG/ACT nasal spray Place 1 spray into both nostrils daily.     . hydrOXYzine (VISTARIL) 25 MG capsule TAKE 1 CAPSULE BY MOUTH THREE TIMES DAILY AS NEEDED. (Patient not taking: Reported on 12/12/2018)    . naproxen (NAPROSYN) 500 MG tablet Take 1 tablet (500 mg total) by mouth 2 (two) times daily with a meal.     .      .      .       Review of Systems PER HPI OTHERWISE ALL SYSTEMS ARE NEGATIVE.    Objective:   Physical Exam Vitals signs reviewed.  Constitutional:      General:  She is not in acute distress.    Appearance: She is well-developed.  HENT:     Head: Normocephalic and atraumatic.     Mouth/Throat:     Pharynx: No oropharyngeal exudate.  Eyes:     General: No scleral icterus.    Pupils: Pupils are equal, round, and reactive to light.  Neck:     Musculoskeletal: Normal range of motion and neck supple.  Cardiovascular:     Rate and Rhythm: Normal rate and regular rhythm.     Heart sounds: Normal heart sounds.  Pulmonary:     Effort: Pulmonary effort is normal. No respiratory distress.     Breath sounds: Normal breath sounds.  Abdominal:     General: Bowel sounds are normal. There is distension (MILD).     Palpations: Abdomen is soft. There is no mass.     Tenderness: There is abdominal tenderness. There is no guarding or rebound.     Hernia: No hernia is present.     Comments: MILD TTP x4  Musculoskeletal:     Left lower leg: No edema.  Lymphadenopathy:     Cervical: No cervical adenopathy.  Skin:    General: Skin is warm and dry.  Neurological:     Mental Status: She is alert and oriented to person, place, and time. Mental status is at baseline.     Comments: NO  NEW FOCAL DEFICITS  Psychiatric:        Behavior: Behavior normal.     Comments: ANXIOUS MOOD, NORMAL AFFECT       Assessment & Plan:

## 2018-12-12 NOTE — Assessment & Plan Note (Signed)
SYMPTOMS NOT IDEALLY CONTROLLED.   WILL READDRESS AFTER ACUTE EPISODE OF NAUSEA/VOMTIING ARE RESOLVED. CONSIDER RE-STARTING AMITIZA. FOLLOW UP IN 4 MOS.

## 2018-12-12 NOTE — Assessment & Plan Note (Signed)
SYMPTOMS NOT IDEALLY CONTROLLED AND EXACERBATED BY LIFESTYLE CHOICES.  DRINK WATER TO KEEP YOUR URINE LIGHT YELLOW. AVOID REFLUX TRIGGERS.  HANDOUT GIVEN. IT'S OK TO CONTINUE DECAF COFFEE. NO SODAS, TOMATOES, PIZZA, SAUSAGE, OR BACON FOR 2 WEEKS. CONTINUE NEXIUM. TAKE 30 MINUTES BEFORE MEALS TWICE DAILY. FOLLOW UP IN 4 MOS.

## 2018-12-12 NOTE — Progress Notes (Signed)
CC'ED TO PCP 

## 2018-12-12 NOTE — Progress Notes (Signed)
Subjective:    Patient ID: Toni Miller, female    DOB: 06/20/1961, 58 y.o.   MRN: [id number]  Toni Helper, MD   HPI LAST SEEN IN 2016 FOR GERD/CONSTIPATION. TROUBLE WITH HEARTBURN FOR 3 DAYS. TRIGGERS: FRIED FOODS BUT USES AIR FRYER, COFFEE-DECAF(2 CUPS), SODA: 2 CANS(ZERO COKE/MT DEW).  NO SODAS IN OAST 3 DAYS. NO BLOOD IN VOMIT. EATS TOMATOES, SAUSAGE AND BACON. TAKES IBUPROFEN AND NAPROXEN PRN. TAKES ASA DAILY. TAKES NEXIUM MOST DAYS. HEARTBURN; PAIN IN BACK AND COMES UP THROUGH THROAT, BURNING IN THROAT AND BACK. STOOLS LONG AND HARD. BMs: STOPPED TAKING AMITIZA. FOOD GOES DOWN BUT WON'T STAY DOWN. TOOK BAKING SODA AND WATER. TUMS DIDN'T HELP.  PT DENIES FEVER, CHILLS, HEMATOCHEZIA, HEMATEMESIS, nausea, vomiting, melena, diarrhea, CHEST PAIN, SHORTNESS OF BREATH,  CHANGE IN BOWEL IN HABITS, constipation, abdominal pain, problems swallowing, OR problems with sedation.   Past Medical History:  Diagnosis Date  . Arthritis   . Cancer ([place]) 1993   abnormal pap treated at [facility]  . Constipation   . GERD (gastroesophageal reflux disease)   . Glaucoma 1993  . Hyperlipidemia   . Hypertension   . Neck pain 7/09   WITH BULGING DISC-----RECIEVING EPIDURALS   . Sinusitis    Past Surgical History:  Procedure Laterality Date  . ABDOMINAL EXPLORATION SURGERY  age 63   bowel obstruction, [facility]  . ABDOMINAL HYSTERECTOMY  2007   [facility], [facility]  . BILATERAL EYE SURGERY     for glaucoma  . BREAST BIOPSY Left    benign  . BREAST SURGERY Left 2004   left partial mastectomy, [facility]  . CATARACT EXTRACTION Right 06/04/2015  . CATARACT EXTRACTION W/PHACO  05/16/2011   Procedure: CATARACT EXTRACTION PHACO AND INTRAOCULAR LENS PLACEMENT (IOC);  Surgeon: Tonny Branch;  Location: [facility];  Service: Ophthalmology;  Laterality: Right;  . COLONOSCOPY N/A 05/17/2013   Dr. Barnie Alderman diverticulosis was noted/small internal hemorrhoids  . CYST REMOVED LEFT BREAST /BENIGN  2005   left, [facility]  . EYE SURGERY   2010, 1992 approx   bilateral  . REPAIR IMPERFORATE ANUS / ANORECTOPLASTY     per patient  . TOTAL ABDOMINAL HYSTERECTOMY W/ BILATERAL SALPINGOOPHORECTOMY  2007   [facility], [facility]  . TUBAL LIGATION  1991   Allergies  Allergen Reactions  . Acyclovir And Related Rash   Current Outpatient Medications  Medication Sig    . aspirin EC 81 MG tablet Take 81 mg by mouth daily.    . calcium-vitamin D (OSCAL WITH D) 500-200 MG-UNIT per tablet Take 1 tablet by mouth 2 (two) times daily.    . cycloSPORINE (RESTASIS) 0.05 % ophthalmic emulsion Place 1 drop into both eyes 2 (two) times daily.    Marland Kitchen esomeprazole (NEXIUM) 40 MG capsule TAKE ONE CAPSULE BY MOUTH ONCE DAILY. MOST DAYS   . gabapentin (NEURONTIN) 300 MG capsule Take 1 capsule (300 mg total) by mouth 2 (two) times daily.    Marland Kitchen ibuprofen (ADVIL,MOTRIN) 600 MG tablet One tablet three times daily for 5 days QOD   . montelukast (SINGULAIR) 10 MG tablet Take 1 tablet (10 mg total) by mouth daily.    . Multiple Vitamin (MULTIVITAMIN) capsule Take 1 capsule by mouth daily.    . polyethylene glycol powder (GLYCOLAX/MIRALAX) powder MIX 1 CAPFUL (17G) IN 8 OUNCES OF JUICE/WATER AND DRINK ONCE DAILY. DEPENDS   . rosuvastatin (CRESTOR) 10 MG tablet One tab at bedtime    . TRAVATAN Z 0.004 % SOLN ophthalmic solution Place  1 drop into both eyes at bedtime.    . triamterene-hydrochlorothiazide (MAXZIDE-25) 37.5-25 MG tablet Take 1 tablet by mouth daily.    . fluticasone (FLONASE) 50 MCG/ACT nasal spray Place 1 spray into both nostrils daily.     . hydrOXYzine (VISTARIL) 25 MG capsule TAKE 1 CAPSULE BY MOUTH THREE TIMES DAILY AS NEEDED. (Patient not taking: Reported on 12/12/2018)    . naproxen (NAPROSYN) 500 MG tablet Take 1 tablet (500 mg total) by mouth 2 (two) times daily with a meal.     .      .      .       Review of Systems PER HPI OTHERWISE ALL SYSTEMS ARE NEGATIVE.    Objective:   Physical Exam Vitals signs reviewed.  Constitutional:      General:  She is not in acute distress.    Appearance: She is well-developed.  HENT:     Head: Normocephalic and atraumatic.     Mouth/Throat:     Pharynx: No oropharyngeal exudate.  Eyes:     General: No scleral icterus.    Pupils: Pupils are equal, round, and reactive to light.  Neck:     Musculoskeletal: Normal range of motion and neck supple.  Cardiovascular:     Rate and Rhythm: Normal rate and regular rhythm.     Heart sounds: Normal heart sounds.  Pulmonary:     Effort: Pulmonary effort is normal. No respiratory distress.     Breath sounds: Normal breath sounds.  Abdominal:     General: Bowel sounds are normal. There is distension (MILD).     Palpations: Abdomen is soft. There is no mass.     Tenderness: There is abdominal tenderness. There is no guarding or rebound.     Hernia: No hernia is present.     Comments: MILD TTP x4  Musculoskeletal:     Left lower leg: No edema.  Lymphadenopathy:     Cervical: No cervical adenopathy.  Skin:    General: Skin is warm and dry.  Neurological:     Mental Status: She is alert and oriented to person, place, and time. Mental status is at baseline.     Comments: NO  NEW FOCAL DEFICITS  Psychiatric:        Behavior: Behavior normal.     Comments: ANXIOUS MOOD, NORMAL AFFECT       Assessment & Plan:

## 2018-12-12 NOTE — Patient Instructions (Addendum)
DRINK WATER TO KEEP YOUR URINE LIGHT YELLOW.  AVOID REFLUX TRIGGERS. SEE INFO BELOW. IT'S OK TO CONTINUE DECAF COFFEE. NO SODAS, TOMATOES, PIZZA, SAUSAGE, OR BACON FOR 2 WEEKS.  CONTINUE NEXIUM. TAKE 30 MINUTES BEFORE MEALS TWICE DAILY.  Complete ACUTE ABDOMINAL SERIES TODAY.   COMPLETE  UPPER ENDOSCOPY.  FOLLOW UP IN 4 MOS.    Lifestyle and home remedies TO CONTROL HEARTBURN You may eliminate or reduce the frequency of heartburn by making the following lifestyle changes:  . Control your weight. Being overweight is a major risk factor for heartburn and GERD. Excess pounds put pressure on your abdomen, pushing up your stomach and causing acid to back up into your esophagus.   . Eat smaller meals. 4 TO 6 MEALS A DAY. This reduces pressure on the lower esophageal sphincter, helping to prevent the valve from opening and acid from washing back into your esophagus.   Dolphus Jenny your belt. Clothes that fit tightly around your waist put pressure on your abdomen and the lower esophageal sphincter.   . Eliminate heartburn triggers. Everyone has specific triggers. Common triggers such as fatty or fried foods, spicy food, tomato sauce, carbonated beverages, alcohol, chocolate, mint, garlic, onion, caffeine and nicotine may make heartburn worse.   Marland Kitchen Avoid stooping or bending. Tying your shoes is OK. Bending over for longer periods to weed your garden isn't, especially soon after eating.   . Don't lie down after a meal. Wait at least three to four hours after eating before going to bed, and don't lie down right after eating.   Alternative medicine . Several home remedies exist for treating GERD, but they provide only temporary relief. They include drinking baking soda (sodium bicarbonate) added to water or drinking other fluids such as baking soda mixed with cream of tartar and water. . Although these liquids create temporary relief by neutralizing, washing away or buffering acids, eventually they  aggravate the situation by adding gas and fluid to your stomach, increasing pressure and causing more acid reflux. Further, adding more sodium to your diet may increase your blood pressure and add stress to your heart, and excessive bicarbonate ingestion can alter the acid-base balance in your body.

## 2018-12-12 NOTE — Assessment & Plan Note (Signed)
SYMPTOMS CONTROLLED/RESOLVED.  CONTINUE TO MONITOR SYMPTOMS. 

## 2018-12-13 NOTE — Progress Notes (Signed)
ON RECALL  °

## 2019-01-09 ENCOUNTER — Ambulatory Visit (HOSPITAL_COMMUNITY)
Admission: RE | Admit: 2019-01-09 | Discharge: 2019-01-09 | Disposition: A | Discharge status: Home / Self Care | Payer: Medicare Other | Attending: Gastroenterology | Admitting: Gastroenterology

## 2019-01-09 ENCOUNTER — Other Ambulatory Visit: Payer: Self-pay

## 2019-01-09 ENCOUNTER — Encounter (HOSPITAL_COMMUNITY): Payer: Self-pay | Admitting: *Deleted

## 2019-01-09 ENCOUNTER — Encounter (HOSPITAL_COMMUNITY)
Admission: RE | Disposition: A | Discharge status: Home / Self Care | Payer: Self-pay | Source: Home / Self Care | Attending: Gastroenterology

## 2019-01-09 DIAGNOSIS — K2970 Gastritis, unspecified, without bleeding: Secondary | ICD-10-CM

## 2019-01-09 DIAGNOSIS — I10 Essential (primary) hypertension: Secondary | ICD-10-CM | POA: Insufficient documentation

## 2019-01-09 DIAGNOSIS — A048 Other specified bacterial intestinal infections: Secondary | ICD-10-CM | POA: Diagnosis not present

## 2019-01-09 DIAGNOSIS — Z7982 Long term (current) use of aspirin: Secondary | ICD-10-CM | POA: Diagnosis not present

## 2019-01-09 DIAGNOSIS — Z7951 Long term (current) use of inhaled steroids: Secondary | ICD-10-CM | POA: Insufficient documentation

## 2019-01-09 DIAGNOSIS — Z791 Long term (current) use of non-steroidal anti-inflammatories (NSAID): Secondary | ICD-10-CM | POA: Insufficient documentation

## 2019-01-09 DIAGNOSIS — M1990 Unspecified osteoarthritis, unspecified site: Secondary | ICD-10-CM | POA: Diagnosis not present

## 2019-01-09 DIAGNOSIS — B9681 Helicobacter pylori [H. pylori] as the cause of diseases classified elsewhere: Secondary | ICD-10-CM | POA: Diagnosis not present

## 2019-01-09 DIAGNOSIS — K2950 Unspecified chronic gastritis without bleeding: Secondary | ICD-10-CM | POA: Insufficient documentation

## 2019-01-09 DIAGNOSIS — K219 Gastro-esophageal reflux disease without esophagitis: Secondary | ICD-10-CM | POA: Insufficient documentation

## 2019-01-09 DIAGNOSIS — E785 Hyperlipidemia, unspecified: Secondary | ICD-10-CM | POA: Diagnosis not present

## 2019-01-09 DIAGNOSIS — Z79899 Other long term (current) drug therapy: Secondary | ICD-10-CM | POA: Insufficient documentation

## 2019-01-09 DIAGNOSIS — M542 Cervicalgia: Secondary | ICD-10-CM | POA: Insufficient documentation

## 2019-01-09 DIAGNOSIS — R1013 Epigastric pain: Secondary | ICD-10-CM

## 2019-01-09 HISTORY — DX: Personal history of urinary calculi: Z87.442

## 2019-01-09 HISTORY — PX: BIOPSY: SHX5522

## 2019-01-09 HISTORY — DX: Major depressive disorder, single episode, unspecified: F32.9

## 2019-01-09 HISTORY — PX: ESOPHAGOGASTRODUODENOSCOPY: SHX5428

## 2019-01-09 HISTORY — DX: Anxiety disorder, unspecified: F41.9

## 2019-01-09 HISTORY — DX: Depression, unspecified: F32.A

## 2019-01-09 SURGERY — EGD (ESOPHAGOGASTRODUODENOSCOPY)
Anesthesia: Moderate Sedation

## 2019-01-09 MED ORDER — SODIUM CHLORIDE 0.9 % IV SOLN
INTRAVENOUS | Status: DC
  Administered 2019-01-09: 1000 mL via INTRAVENOUS

## 2019-01-09 MED ORDER — MIDAZOLAM HCL 5 MG/5ML IJ SOLN
INTRAMUSCULAR | Status: DC | PRN
  Administered 2019-01-09 (×2): 2 mg via INTRAVENOUS
  Administered 2019-01-09: 1 mg via INTRAVENOUS

## 2019-01-09 MED ORDER — LIDOCAINE VISCOUS HCL 2 % MT SOLN
OROMUCOSAL | Status: DC | PRN
  Administered 2019-01-09: 1 via OROMUCOSAL

## 2019-01-09 MED ORDER — MIDAZOLAM HCL 5 MG/5ML IJ SOLN
INTRAMUSCULAR | Status: AC
  Filled 2019-01-09: qty 10

## 2019-01-09 MED ORDER — MEPERIDINE HCL 100 MG/ML IJ SOLN
INTRAMUSCULAR | Status: DC | PRN
  Administered 2019-01-09 (×2): 25 mg via INTRAVENOUS

## 2019-01-09 MED ORDER — MEPERIDINE HCL 100 MG/ML IJ SOLN
INTRAMUSCULAR | Status: AC
  Filled 2019-01-09: qty 2

## 2019-01-09 MED ORDER — STERILE WATER FOR IRRIGATION IR SOLN
Status: DC | PRN
  Administered 2019-01-09: 1.5 mL

## 2019-01-09 MED ORDER — LIDOCAINE VISCOUS HCL 2 % MT SOLN
OROMUCOSAL | Status: AC
  Filled 2019-01-09: qty 15
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
## 2019-01-09 NOTE — Discharge Instructions (Signed)
You have mild gastritis. YOUR SMALL BOWEL LOOKED NORMAL. I biopsied your stomach.    DRINK WATER TO KEEP YOUR URINE LIGHT YELLOW.  AVOID REFLUX TRIGGERS. SEE INFO BELOW. IT'S OK TO CONTINUE DECAF COFFEE. NO SODAS, TOMATOES, PIZZA, SAUSAGE, OR BACON FOR 2 WEEKS.  FOLLOW A LOW FAT DIET. MEATS SHOULD BE BAKED, BROILED, OR BOILED. AVOID FRIED FOODS. SEE INFO BELOW.   CONTINUE NEXIUM. TAKE 30 MINUTES BEFORE MEALS TWICE DAILY.  PEPCID  HELPS MOST WHEN USED AS NEEDED.   YOUR BIOPSY RESULTS WILL BE BACK IN 5 BUSINESS DAYS.  FOLLOW UP IN 4 MOS.  UPPER ENDOSCOPY AFTER CARE Read the instructions outlined below and refer to this sheet in the next week. These discharge instructions provide you with general information on caring for yourself after you leave the hospital. While your treatment has been planned according to the most current medical practices available, unavoidable complications occasionally occur. If you have any problems or questions after discharge, call DR. FIELDS, [phone].  ACTIVITY  You may resume your regular activity, but move at a slower pace for the next 24 hours.   Take frequent rest periods for the next 24 hours.   Walking will help get rid of the air and reduce the bloated feeling in your belly (abdomen).   No driving for 24 hours (because of the medicine (anesthesia) used during the test).   You may shower.   Do not sign any important legal documents or operate any machinery for 24 hours (because of the anesthesia used during the test).    NUTRITION  Drink plenty of fluids.   You may resume your normal diet as instructed by your doctor.   Begin with a light meal and progress to your normal diet. Heavy or fried foods are harder to digest and may make you feel sick to your stomach (nauseated).   Avoid alcoholic beverages for 24 hours or as instructed.    MEDICATIONS  You may resume your normal medications.   WHAT YOU CAN EXPECT TODAY  Some  feelings of bloating in the abdomen.   Passage of more gas than usual.    IF YOU HAD A BIOPSY TAKEN DURING THE UPPER ENDOSCOPY:  Eat a soft diet IF YOU HAVE NAUSEA, BLOATING, ABDOMINAL PAIN, OR VOMITING.    FINDING OUT THE RESULTS OF YOUR TEST Not all test results are available during your visit. DR. Oneida Alar WILL CALL YOU WITHIN 14 DAYS OF YOUR PROCEDUE WITH YOUR RESULTS. Do not assume everything is normal if you have not heard from DR. FIELDS, CALL HER OFFICE AT [phone].  SEEK IMMEDIATE MEDICAL ATTENTION AND CALL THE OFFICE: [phone] IF:  You have more than a spotting of blood in your stool.   Your belly is swollen (abdominal distention).   You are nauseated or vomiting.   You have a temperature over 101F.   You have abdominal pain or discomfort that is severe or gets worse throughout the day.   Gastritis  Gastritis is an inflammation (the body's way of reacting to injury and/or infection) of the stomach. It is often caused by viral or bacterial (germ) infections. It can also be caused BY ASPIRIN, BC/GOODY POWDER'S, (IBUPROFEN) MOTRIN, OR ALEVE (NAPROXEN), chemicals (including alcohol), SPICY FOODS, and medications. This illness may be associated with generalized malaise (feeling tired, not well), UPPER ABDOMINAL STOMACH cramps, and fever. One common bacterial cause of gastritis is an organism known as H. Pylori. This can be treated with antibiotics.    Low-Fat Diet  BREADS, CEREALS, PASTA, RICE, DRIED PEAS, AND BEANS These products are high in carbohydrates and most are low in fat. Therefore, they can be increased in the diet as substitutes for fatty foods. They too, however, contain calories and should not be eaten in excess. Cereals can be eaten for snacks as well as for breakfast.  Include foods that contain fiber (fruits, vegetables, whole grains, and legumes). Research shows that fiber may lower blood cholesterol levels, especially the water-soluble fiber found in  fruits, vegetables, oat products, and legumes.  FRUITS AND VEGETABLES It is good to eat fruits and vegetables. Besides being sources of fiber, both are rich in vitamins and some minerals. They help you get the daily allowances of these nutrients. Fruits and vegetables can be used for snacks and desserts.  MEATS Limit lean meat, chicken, [place], and fish to no more than 6 ounces per day.  Beef, Pork, and Lamb Use lean cuts of beef, pork, and lamb. Lean cuts include:  Extra-lean ground beef.  Arm roast.  Sirloin tip.  Center-cut ham.  Round steak.  Loin chops.  Rump roast.  Tenderloin.  Trim all fat off the outside of meats before cooking. It is not necessary to severely decrease the intake of red meat, but lean choices should be made. Lean meat is rich in protein and contains a highly absorbable form of iron. Premenopausal women, in particular, should avoid reducing lean red meat because this could increase the risk for low red blood cells (iron-deficiency anemia).  Chicken and [place] These are good sources of protein. The fat of poultry can be reduced by removing the skin and underlying fat layers before cooking. Chicken and [place] can be substituted for lean red meat in the diet. Poultry should not be fried or covered with high-fat sauces.  Fish and Shellfish Fish is a good source of protein. Shellfish contain cholesterol, but they usually are low in saturated fatty acids. The preparation of fish is important. Like chicken and [place], they should not be fried or covered with high-fat sauces.  EGGS Egg whites contain no fat or cholesterol. They can be eaten often. Try 1 to 2 egg whites instead of whole eggs in recipes or use egg substitutes that do not contain yolk.  MILK AND DAIRY PRODUCTS Use skim or 1% milk instead of 2% or whole milk. Decrease whole milk, natural, and processed cheeses. Use nonfat or low-fat (2%) cottage cheese or low-fat cheeses made from vegetable oils. Choose  nonfat or low-fat (1 to 2%) yogurt. Experiment with evaporated skim milk in recipes that call for heavy cream. Substitute low-fat yogurt or low-fat cottage cheese for sour cream in dips and salad dressings. Have at least 2 servings of low-fat dairy products, such as 2 glasses of skim (or 1%) milk each day to help get your daily calcium intake.  FATS AND OILS Reduce the total intake of fats, especially saturated fat. Butterfat, lard, and beef fats are high in saturated fat and cholesterol. These should be avoided as much as possible. Vegetable fats do not contain cholesterol, but certain vegetable fats, such as coconut oil, palm oil, and palm kernel oil are very high in saturated fats. These should be limited. These fats are often used in bakery goods, processed foods, popcorn, oils, and nondairy creamers. Vegetable shortenings and some peanut butters contain hydrogenated oils, which are also saturated fats. Read the labels on these foods and check for saturated vegetable oils.  Unsaturated vegetable oils and fats do not raise blood  cholesterol. However, they should be limited because they are fats and are high in calories. Total fat should still be limited to 30% of your daily caloric intake. Desirable liquid vegetable oils are corn oil, cottonseed oil, olive oil, canola oil, safflower oil, soybean oil, and sunflower oil. Peanut oil is not as good, but small amounts are acceptable. Buy a heart-healthy tub margarine that has no partially hydrogenated oils in the ingredients. Mayonnaise and salad dressings often are made from unsaturated fats, but they should also be limited because of their high calorie and fat content. Seeds, nuts, peanut butter, olives, and avocados are high in fat, but the fat is mainly the unsaturated type. These foods should be limited mainly to avoid excess calories and fat.  OTHER EATING TIPS Snacks  Most sweets should be limited as snacks. They tend to be rich in calories and fats,  and their caloric content outweighs their nutritional value. Some good choices in snacks are graham crackers, melba toast, soda crackers, bagels (no egg), English muffins, fruits, and vegetables. These snacks are preferable to snack crackers, [place] fries, and chips. Popcorn should be air-popped or cooked in small amounts of liquid vegetable oil.  Desserts Eat fruit, low-fat yogurt, and fruit ices instead of pastries, cake, and cookies. Sherbet, angel food cake, gelatin dessert, frozen low-fat yogurt, or other frozen products that do not contain saturated fat (pure fruit juice bars, frozen ice pops) are also acceptable.   COOKING METHODS Choose those methods that use little or no fat. They include: Poaching.  Braising.  Steaming.  Grilling.  Baking.  Stir-frying.  Broiling.  Microwaving.  Foods can be cooked in a nonstick pan without added fat, or use a nonfat cooking spray in regular cookware. Limit fried foods and avoid frying in saturated fat. Add moisture to lean meats by using water, broth, cooking wines, and other nonfat or low-fat sauces along with the cooking methods mentioned above. Soups and stews should be chilled after cooking. The fat that forms on top after a few hours in the refrigerator should be skimmed off. When preparing meals, avoid using excess salt. Salt can contribute to raising blood pressure in some people.  EATING AWAY FROM HOME Order entres, potatoes, and vegetables without sauces or butter. When meat exceeds the size of a deck of cards (3 to 4 ounces), the rest can be taken home for another meal. Choose vegetable or fruit salads and ask for low-calorie salad dressings to be served on the side. Use dressings sparingly. Limit high-fat toppings, such as bacon, crumbled eggs, cheese, sunflower seeds, and olives. Ask for heart-healthy tub margarine instead of butter.

## 2019-01-09 NOTE — Op Note (Signed)
[facility] Patient Name: Toni Miller Procedure Date: 01/09/2019 12:03 PM MRN: [id number] Date of Birth: 1961/03/29 Attending MD: Barney Drain MD, MD CSN: [id number] Age: 58 Admit Type: Outpatient Procedure:                Upper GI endoscopy with COLD FORCEPS BIOPSY Indications:              Esophageal reflux symptoms that persist despite                            appropriate therapy Providers:                Barney Drain MD, MD, Lurline Del, RN, Nelma Rothman,                            Technician Referring MD:             Norwood Levo. Simpson MD, MD Medicines:                Meperidine 50 mg IV, Midazolam 5 mg IV Complications:            No immediate complications. Estimated Blood Loss:     Estimated blood loss was minimal. Procedure:                Pre-Anesthesia Assessment:                           - Prior to the procedure, a History and Physical                            was performed, and patient medications and                            allergies were reviewed. The patient's tolerance of                            previous anesthesia was also reviewed. The risks                            and benefits of the procedure and the sedation                            options and risks were discussed with the patient.                            All questions were answered, and informed consent                            was obtained. Prior Anticoagulants: The patient                            last took aspirin 1 day and naproxen 1 day prior to                            the procedure. ASA Grade Assessment: II - A patient  with mild systemic disease. After reviewing the                            risks and benefits, the patient was deemed in                            satisfactory condition to undergo the procedure.                            After obtaining informed consent, the endoscope was                            passed under direct vision.  Throughout the                            procedure, the patient's blood pressure, pulse, and                            oxygen saturations were monitored continuously. The                            [id number]-H190 ([id number]) scope was introduced through the                            mouth, and advanced to the second part of duodenum.                            The upper GI endoscopy was accomplished without                            difficulty. The patient tolerated the procedure                            well. Scope In: 12:32:28 PM Scope Out: 12:38:40 PM Total Procedure Duration: 0 hours 6 minutes 12 seconds  Findings:      The examined esophagus was normal.      Localized mild inflammation characterized by congestion (edema) and       erythema was found in the gastric antrum. Biopsies(2; BODY, 3: ANTRUM)       were taken with a cold forceps for Helicobacter pylori testing.      The examined duodenum was normal. Impression:               - DYSPEPSIA MOST LIKLEY DUE TO UNCONTROLLED REFLUX                           - MILD NSAID Gastritis. Biopsied. Moderate Sedation:      Moderate (conscious) sedation was administered by the endoscopy nurse       and supervised by the endoscopist. The following parameters were       monitored: oxygen saturation, heart rate, blood pressure, and response       to care. Total physician intraservice time was 13 minutes. Recommendation:           - Patient has a contact number available for  emergencies. The signs and symptoms of potential                            delayed complications were discussed with the                            patient. Return to normal activities tomorrow.                            Written discharge instructions were provided to the                            patient.                           - Low fat diet. AVOID REFLUX TRIGGERS.                           - Continue present medications.                            - Await pathology results.                           - Return to my office in 4 months. Procedure Code(s):        --- Professional ---                           [phone], Esophagogastroduodenoscopy, flexible,                            transoral; with biopsy, single or multiple                           G0500, Moderate sedation services provided by the                            same physician or other qualified health care                            professional performing a gastrointestinal                            endoscopic service that sedation supports,                            requiring the presence of an independent trained                            observer to assist in the monitoring of the                            patient's level of consciousness and physiological                            status; initial 15 minutes  of intra-service time;                            patient age 40 years or older (additional time may                            be reported with [phone], as appropriate) Diagnosis Code(s):        --- Professional ---                           K29.70, Gastritis, unspecified, without bleeding                           K21.9, Gastro-esophageal reflux disease without                            esophagitis CPT copyright 2018 American Medical Association. All rights reserved. The codes documented in this report are preliminary and upon coder review may  be revised to meet current compliance requirements. Barney Drain, MD Barney Drain MD, MD 01/09/2019 12:55:52 PM This report has been signed electronically. Number of Addenda: 0

## 2019-01-09 NOTE — Interval H&P Note (Signed)
History and Physical Interval Note:  01/09/2019 12:11 PM  Toni Miller  has presented today for surgery, with the diagnosis of dyspepsia  The various methods of treatment have been discussed with the patient and family. After consideration of risks, benefits and other options for treatment, the patient has consented to  Procedure(s) with comments: ESOPHAGOGASTRODUODENOSCOPY (EGD) (N/A) - 12:45pm as a surgical intervention .  The patient's history has been reviewed, patient examined, no change in status, stable for surgery.  I have reviewed the patient's chart and labs.  Questions were answered to the patient's satisfaction.     [employer]

## 2019-01-14 ENCOUNTER — Encounter (HOSPITAL_COMMUNITY): Payer: Self-pay | Admitting: Gastroenterology

## 2019-01-15 ENCOUNTER — Telehealth: Payer: Self-pay | Admitting: Gastroenterology

## 2019-01-15 MED ORDER — CLARITHROMYCIN 500 MG PO TABS: ORAL_TABLET | ORAL | 0 refills | Status: DC

## 2019-01-15 MED ORDER — AMOXICILLIN 500 MG PO TABS: ORAL_TABLET | ORAL | 0 refills | Status: DC

## 2019-01-15 NOTE — Telephone Encounter (Signed)
PLEASE CALL PT. Her stomach Bx showed H. Pylori infection. She needs AMOXICILLIN 500 mg 2 po BID for 10 days and Biaxin 500 mg po bid for 10 days. CONTINUE NEXIUM BID. DO NOT TAKE CRESTOR(ROSUVASTATIN) WHILE TAKING THE ANTIBIOTICS. Med side effects include NVD, abd pain, and metallic taste.  YOUR SISTERS, BROTHERS, CHILDREN OVER AGE 56, AND PARENTS NEED TO HAVE THE H PYLORI STOOL OR BREATH TEST.   PLEASE Please CALL or SEND me A MY CHART MESSAGE IF YOU HAVE QUESTIONS OR CONCERNS.   FOLLOW UP IN 4 MOS.

## 2019-01-15 NOTE — Telephone Encounter (Signed)
LM for a return call.  

## 2019-01-16 ENCOUNTER — Ambulatory Visit (INDEPENDENT_AMBULATORY_CARE_PROVIDER_SITE_OTHER): Payer: Medicare Other | Admitting: Psychiatry

## 2019-01-16 ENCOUNTER — Other Ambulatory Visit: Payer: Self-pay

## 2019-01-16 ENCOUNTER — Encounter (HOSPITAL_COMMUNITY): Payer: Self-pay | Admitting: Psychiatry

## 2019-01-16 VITALS — BP 130/90 | HR 106 | Ht 62.0 in | Wt 124.0 lb

## 2019-01-16 DIAGNOSIS — G4709 Other insomnia: Secondary | ICD-10-CM

## 2019-01-16 DIAGNOSIS — F330 Major depressive disorder, recurrent, mild: Secondary | ICD-10-CM | POA: Diagnosis not present

## 2019-01-16 NOTE — Patient Instructions (Signed)
1. Continue venlafaxine 150 mg daily  2. Referral to therapy  3. Return to clinic in one month for 30 mins

## 2019-01-16 NOTE — Telephone Encounter (Signed)
PATIENT ON RECALL FOR [facility] APPOINTMENT

## 2019-01-16 NOTE — Telephone Encounter (Signed)
LMOM that I have a very important message for her.

## 2019-01-16 NOTE — Telephone Encounter (Signed)
Pt is aware.  

## 2019-02-11 ENCOUNTER — Other Ambulatory Visit: Payer: Self-pay | Admitting: Family Medicine
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
## 2019-02-18 ENCOUNTER — Ambulatory Visit (HOSPITAL_COMMUNITY): Payer: Medicare Other | Admitting: Psychiatry

## 2019-02-18 ENCOUNTER — Encounter (HOSPITAL_COMMUNITY): Payer: Self-pay | Admitting: Psychiatry

## 2019-02-18 ENCOUNTER — Ambulatory Visit (INDEPENDENT_AMBULATORY_CARE_PROVIDER_SITE_OTHER): Payer: Medicare Other | Admitting: Psychiatry

## 2019-02-18 ENCOUNTER — Other Ambulatory Visit: Payer: Self-pay

## 2019-02-18 DIAGNOSIS — F79 Unspecified intellectual disabilities: Secondary | ICD-10-CM | POA: Diagnosis not present

## 2019-02-18 DIAGNOSIS — R413 Other amnesia: Secondary | ICD-10-CM | POA: Diagnosis not present

## 2019-02-18 DIAGNOSIS — F330 Major depressive disorder, recurrent, mild: Secondary | ICD-10-CM | POA: Diagnosis not present

## 2019-02-18 NOTE — Patient Instructions (Signed)
1. Continue venlafaxine 150 mg daily  2. Next appointment 6/8 at [facility] for 15 mins

## 2019-02-28 ENCOUNTER — Ambulatory Visit (INDEPENDENT_AMBULATORY_CARE_PROVIDER_SITE_OTHER): Payer: Medicare Other | Admitting: Family Medicine

## 2019-02-28 ENCOUNTER — Other Ambulatory Visit: Payer: Self-pay

## 2019-02-28 ENCOUNTER — Encounter: Payer: Self-pay | Admitting: Family Medicine

## 2019-02-28 DIAGNOSIS — E785 Hyperlipidemia, unspecified: Secondary | ICD-10-CM

## 2019-02-28 DIAGNOSIS — R413 Other amnesia: Secondary | ICD-10-CM | POA: Diagnosis not present

## 2019-02-28 DIAGNOSIS — I10 Essential (primary) hypertension: Secondary | ICD-10-CM | POA: Diagnosis not present

## 2019-02-28 NOTE — Patient Instructions (Addendum)
    Thank you for completing your visit today via WebEx. I appreciate the opportunity to provide you with the care for your health and wellness. Today we discussed: Overall health, memory, medication, labs.  I have ordered some labs that you will have drawn before your next appointment with Dr. Moshe Cipro.  Please get these labs 1 week before your next appointment.  It is okay for you to take your blood pressure medicine in the morning.  This medication has a diuretic in it and it will make you go to the bathroom frequently.  I am sorry that the bottle does say take it at 6 PM but it is okay for you to take it in the morning.  So that you are not interrupting her sleep frequently at night.  We are pleased that your memory is doing well free at this time and it is not giving you any trouble.  We are also happy to hear that your headaches have improved some.  These follow-up with the podiatrist, foot doctor so that you can get your ingrown toenail taken care of.   Maintain a heart healthy, low-sodium diet.  Well as low fat.  Really encourage you to do some form of exercise for 30 minutes on at least 5 days of the week.  Such as walking.  Let us know if you need anything outside of what we discussed today.  Gonzalez YOUR HANDS WELL AND FREQUENTLY. AVOID TOUCHING YOUR FACE, UNLESS YOUR HANDS ARE FRESHLY WASHED.  GET FRESH AIR DAILY. STAY HYDRATED WITH WATER.   It was a pleasure to see you and I look forward to continuing to work together on your health and well-being. Please do not hesitate to call the office if you need care or have questions about your care.  Have a wonderful day and week. With Gratitude, Cherly Beach, DNP, AGNP-BC

## 2019-02-28 NOTE — Progress Notes (Signed)
Virtual Visit via Video Note  I connected with Toni Miller on 02/28/19 at 11:00 AM EDT by a video enabled telemedicine application and verified that I am speaking with the correct person using two identifiers.   I discussed the limitations of evaluation and management by telemedicine and the availability of in person appointments. The patient expressed understanding and agreed to proceed.  Location of Patient: Home Location of Provider: Telehealth Consent was obtain for visit to be over via telehealth.  History of Present Illness: Toni Miller is a 58 year old female patient of Dr. Griffin Dakin.  That is well-known to the clinic.  Who presents today for follow-up regarding memory loss and hypertension.    Memory: Is not currently on anything for memory at this time.  She was seen by neurology in December of last year.  Diagnosed with intellectual disability which she was diagnosed with back in childhood years during middle school assessment.  Her MMSE at that time was 21 out of 30.  Today she reports that she is doing well and has not had any issues with her memory.  Side of occasionally forgetting where she might have placed her cell phone or keys.  Hypertension: Reports taking medications as directed.  Did get confused about blood pressure medication because she was unsure of when to take it, cause the bottle from pharmacy stated to take it at 6 PM when she regularly was taking it in the morning Dr. Moshe Cipro had already told her she could take it in the morning she wanted to reverify this. Needs to know that it is okay to take medication in the morning so that she is not using the bathroom at night.  Her only other complaint is a right foot ingrown toenail.  Which she has had in the past.  Which the podiatrist has taken care of for her.  She is going to follow-up with him to have him handled this when as well.  Overall reports that she is doing well.  Has no complaints other than stated above.   Denies having any fever, chills, any signs of allergies and or congestion outside of occasional sneezing.  Denies having any cough, shortness of breath, chest pain, chest tightness.  Denies having any headaches, vision changes, palpitations.  Past Medical, Surgical, Social History, Allergies, and Medications have been Reviewed.   Review of Systems  Constitutional: Negative for chills and fever.  HENT: Positive for sneezing.   Eyes: Negative.   Respiratory: Negative for cough, chest tightness, shortness of breath and wheezing.   Cardiovascular: Negative for chest pain, palpitations and leg swelling.  Gastrointestinal: Negative.   Endocrine: Negative.   Genitourinary: Negative.   Musculoskeletal: Negative.   Skin: Negative.   Allergic/Immunologic: Negative.  Negative for environmental allergies.  Neurological: Negative.  Negative for headaches.  Hematological: Negative.   Psychiatric/Behavioral: Negative.   All other systems reviewed and are negative.   Observations/Objective: Good communication -video failed  Assessment and Plan: 1. Memory loss Stable, she reports that she is doing well.  Has not seen any decline in where her memory already was.  2. Essential hypertension Reports taking her blood pressure medicine as directed.  But does not want to take it at night as the bottle was directed to tell her to take it.  Advised that she could take it during the day and that would be fine.  Will order CBC and C met with GFR for next appointment.  - CBC with Differential/Platelet - COMPLETE METABOLIC PANEL WITH  GFR  3. Hyperlipidemia with target LDL less than 100 Controlled, maintained on Crestor Will assess lipid status with panel draw for next appointment.  Encouraged to maintain heart healthy, low-fat, low-cholesterol diet.  Encouraged to maintain exercise as well.  - Lipid panel  Follow Up Instructions: AVS printed and mailed   I discussed the assessment and treatment plan  with the patient. The patient was provided an opportunity to ask questions and all were answered. The patient agreed with the plan and demonstrated an understanding of the instructions.   The patient was advised to call back or seek an in-person evaluation if the symptoms worsen or if the condition fails to improve as anticipated.  I provided 10 minutes of non-face-to-face time during this encounter.   Perlie Mayo, NP

## 2019-03-11 ENCOUNTER — Other Ambulatory Visit: Payer: Self-pay

## 2019-03-11 ENCOUNTER — Encounter (HOSPITAL_COMMUNITY): Payer: Self-pay | Admitting: Psychiatry

## 2019-03-11 ENCOUNTER — Ambulatory Visit (INDEPENDENT_AMBULATORY_CARE_PROVIDER_SITE_OTHER): Payer: Medicare Other | Admitting: Psychiatry

## 2019-03-11 DIAGNOSIS — F330 Major depressive disorder, recurrent, mild: Secondary | ICD-10-CM
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
## 2019-03-26 ENCOUNTER — Ambulatory Visit (INDEPENDENT_AMBULATORY_CARE_PROVIDER_SITE_OTHER): Payer: Medicare Other | Admitting: Psychiatry

## 2019-03-26 ENCOUNTER — Other Ambulatory Visit: Payer: Self-pay

## 2019-03-26 DIAGNOSIS — H401112 Primary open-angle glaucoma, right eye, moderate stage: Secondary | ICD-10-CM | POA: Diagnosis not present

## 2019-03-26 DIAGNOSIS — F330 Major depressive disorder, recurrent, mild: Secondary | ICD-10-CM | POA: Diagnosis not present

## 2019-03-26 NOTE — Progress Notes (Signed)
Virtual Visit via Video Note  I connected with Toni Miller on 03/26/19 at  9:00 AM EDT by a video enabled telemedicine application and verified that I am speaking with the correct person using two identifiers.   I discussed the limitations of evaluation and management by telemedicine and the availability of in person appointments. The patient expressed understanding and agreed to proceed.  I provided  40 minutes of non-face-to-face time during this encounter.   Alonza Smoker, LCSW    THERAPIST PROGRESS NOTE  Session Time: Tuesday 03/26/2019 9:15 AM -  9:55 AM   Participation Level: Active  Behavioral Response: CasualAlertAnxious/Depressed  Type of Therapy: Individual Therapy  Treatment Goals addressed: Establish rapport, learn and implement behavioral and cognitive strategies to overcome depression,   Interventions: CBT and Supportive  Summary: Toni Miller is a 58 y.o. female who is referred for services by pychiatrist Dr. Modesta Messing due to patient experiencing symptoms of depression. She presents with a history of depression, intellectual disability, hyperlipidemia, and hypertension. She is a returning patient to this clinician as she was seen about 5 years ago due to experiencing depression and anxiety. She denies any psychiatric hospitalizations. She reports increased thoughts about how she was treated during her childhood. She reports having negative  memories of her foster mother and becoming really sad along with having crying spells at night.   Patient last was seen 2 weeks ago by virtual visit. She reports feeling better since last visit as she placed flowers on biological mother's grave and expressed her feelings/concerns. She reports decreased ruminating thoughts about the past but continues to have feelings of sadness and confusion about her relationship with foster mother who now is deceased. Patient also expresses anger with foster mother. Patient reports increased  involvement in activity (doing puzzles, light household tasks) and says visiting/talking to her sister has been helpful.   Suicidal/Homicidal: Nowithout intent/plan  Therapist Response: Established rapport, reviewed symptoms, began to examine patient's relationship with her foster mother, facilitated patient identifying and verbalizing feelings of sadness, anger, and hurt, encouraged patient to maintain involvement in activity  Plan: Return again in 2 weeks.  Diagnosis: Axis I: MDD, Recurrent, mild        Alonza Smoker, LCSW 03/26/2019
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
## 2019-04-09 ENCOUNTER — Other Ambulatory Visit: Payer: Self-pay

## 2019-04-09 ENCOUNTER — Ambulatory Visit (INDEPENDENT_AMBULATORY_CARE_PROVIDER_SITE_OTHER): Payer: Medicare Other | Admitting: Psychiatry

## 2019-04-09 DIAGNOSIS — F330 Major depressive disorder, recurrent, mild: Secondary | ICD-10-CM
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
## 2019-04-10 ENCOUNTER — Encounter: Payer: Self-pay | Admitting: Gastroenterology

## 2019-04-15 ENCOUNTER — Ambulatory Visit (HOSPITAL_COMMUNITY): Payer: Medicare Other | Admitting: Psychiatry

## 2019-04-15 ENCOUNTER — Other Ambulatory Visit: Payer: Self-pay

## 2019-04-15 ENCOUNTER — Ambulatory Visit (INDEPENDENT_AMBULATORY_CARE_PROVIDER_SITE_OTHER): Payer: Medicare Other | Admitting: Psychiatry

## 2019-04-15 ENCOUNTER — Encounter (HOSPITAL_COMMUNITY): Payer: Self-pay | Admitting: Psychiatry

## 2019-04-15 DIAGNOSIS — F3341 Major depressive disorder, recurrent, in partial remission: Secondary | ICD-10-CM

## 2019-04-15 DIAGNOSIS — R413 Other amnesia: Secondary | ICD-10-CM | POA: Diagnosis not present

## 2019-04-15 MED ORDER — VENLAFAXINE HCL ER 150 MG PO CP24: 150.0000 mg | ORAL_CAPSULE | Freq: Every day | ORAL | 0 refills | Status: DC

## 2019-04-15 NOTE — Patient Instructions (Signed)
1. Continue venlafaxine 150 mg daily  2. Next appointment : 9/8 at 9 AM

## 2019-04-22 ENCOUNTER — Other Ambulatory Visit: Payer: Self-pay

## 2019-04-22 ENCOUNTER — Ambulatory Visit (INDEPENDENT_AMBULATORY_CARE_PROVIDER_SITE_OTHER): Payer: Medicare Other | Admitting: Family Medicine

## 2019-04-22 ENCOUNTER — Encounter: Payer: Self-pay | Admitting: Family Medicine

## 2019-04-22 VITALS — BP 134/88 | Ht 62.0 in | Wt 125.1 lb

## 2019-04-22 DIAGNOSIS — I10 Essential (primary) hypertension: Secondary | ICD-10-CM

## 2019-04-23 ENCOUNTER — Telehealth (HOSPITAL_COMMUNITY): Payer: Self-pay | Admitting: Psychiatry

## 2019-04-23 ENCOUNTER — Ambulatory Visit (HOSPITAL_COMMUNITY): Payer: Medicare Other | Admitting: Psychiatry

## 2019-04-23 ENCOUNTER — Encounter: Payer: Self-pay | Admitting: Family Medicine

## 2019-04-23 ENCOUNTER — Other Ambulatory Visit: Payer: Self-pay

## 2019-04-23 ENCOUNTER — Ambulatory Visit (INDEPENDENT_AMBULATORY_CARE_PROVIDER_SITE_OTHER): Payer: Medicare Other | Admitting: Family Medicine

## 2019-04-23 VITALS — BP 134/88 | Ht 62.0 in | Wt 125.1 lb

## 2019-04-23 DIAGNOSIS — F3341 Major depressive disorder, recurrent, in partial remission: Secondary | ICD-10-CM | POA: Diagnosis not present

## 2019-04-23 DIAGNOSIS — M25562 Pain in left knee: Secondary | ICD-10-CM | POA: Diagnosis not present

## 2019-04-23 DIAGNOSIS — Z1231 Encounter for screening mammogram for malignant neoplasm of breast: Secondary | ICD-10-CM | POA: Diagnosis not present

## 2019-04-23 DIAGNOSIS — I10 Essential (primary) hypertension: Secondary | ICD-10-CM

## 2019-04-23 DIAGNOSIS — M25552 Pain in left hip: Secondary | ICD-10-CM

## 2019-04-23 DIAGNOSIS — G8929 Other chronic pain: Secondary | ICD-10-CM

## 2019-04-23 DIAGNOSIS — E785 Hyperlipidemia, unspecified: Secondary | ICD-10-CM | POA: Diagnosis not present

## 2019-04-23 LAB — COMPLETE METABOLIC PANEL WITH GFR
AG Ratio: 1.6 (calc) (ref 1.0–2.5)
ALT: 20 U/L (ref 6–29)
AST: 20 U/L (ref 10–35)
Albumin: 4.4 g/dL (ref 3.6–5.1)
Alkaline phosphatase (APISO): 74 U/L (ref 37–153)
BUN: 16 mg/dL (ref 7–25)
CO2: 33 mmol/L — ABNORMAL HIGH (ref 20–32)
Calcium: 9.4 mg/dL (ref 8.6–10.4)
Chloride: 99 mmol/L (ref 98–110)
Creat: 0.87 mg/dL (ref 0.50–1.05)
GFR, Est African American: 86 mL/min/{1.73_m2} (ref >=60)
GFR, Est Non African American: 74 mL/min/{1.73_m2} (ref >=60)
Globulin: 2.8 g/dL (calc) (ref 1.9–3.7)
Glucose, Bld: 79 mg/dL (ref 65–99)
Potassium: 3.7 mmol/L (ref 3.5–5.3)
Sodium: 138 mmol/L (ref 135–146)
Total Bilirubin: 0.3 mg/dL (ref 0.2–1.2)
Total Protein: 7.2 g/dL (ref 6.1–8.1)

## 2019-04-23 LAB — LIPID PANEL
Cholesterol: 161 mg/dL (ref <200)
HDL: 65 mg/dL (ref >=50)
LDL Cholesterol (Calc): 81 mg/dL (calc)
Non-HDL Cholesterol (Calc): 96 mg/dL (calc) (ref <130)
Total CHOL/HDL Ratio: 2.5 (calc) (ref <5.0)
Triglycerides: 72 mg/dL (ref <150)

## 2019-04-23 LAB — CBC WITH DIFFERENTIAL/PLATELET
Absolute Monocytes: 549 cells/uL (ref 200–950)
Basophils Absolute: 43 cells/uL (ref 0–200)
Basophils Relative: 0.7 %
Eosinophils Absolute: 79 cells/uL (ref 15–500)
Eosinophils Relative: 1.3 %
HCT: 39.4 % (ref 35.0–45.0)
Hemoglobin: 13.6 g/dL (ref 11.7–15.5)
Lymphs Abs: 2129 cells/uL (ref 850–3900)
MCH: 30.6 pg (ref 27.0–33.0)
MCHC: 34.5 g/dL (ref 32.0–36.0)
MCV: 88.7 fL (ref 80.0–100.0)
MPV: 9 fL (ref 7.5–12.5)
Monocytes Relative: 9 %
Neutro Abs: 3300 cells/uL (ref 1500–7800)
Neutrophils Relative %: 54.1 %
Platelets: 268 10*3/uL (ref 140–400)
RBC: 4.44 10*6/uL (ref 3.80–5.10)
RDW: 12.7 % (ref 11.0–15.0)
Total Lymphocyte: 34.9 %
WBC: 6.1 10*3/uL (ref 3.8–10.8)

## 2019-04-23 NOTE — Progress Notes (Signed)
Virtual Visit via Telephone Note  I connected with Toni Miller on 04/23/19 at  2:40 PM EDT by telephone and verified that I am speaking with the correct person using two identifiers.  Location: Patient: home Provider: office   I discussed the limitations, risks, security and privacy concerns of performing an evaluation and management service by telephone and the availability of in person appointments. I also discussed with the patient that there may be a patient responsible charge related to this service. The patient expressed understanding and agreed to proceed. This visit type is conducted due to national recommendations for restrictions regarding the COVID -19 Pandemic. Due to the patient's age and / or co morbidities, this format is felt to be most appropriate at this time without adequate follow up. The patient has no access to video technology/ had technical difficulties with video, requiring transitioning to audio format  only ( telephone ). All issues noted this document were discussed and addressed,no physical exam can be performed in this format.   History of Present Illness: F/u chronic probl;ems, update labs, screening and immunization Denies recent fever or chills. Denies sinus pressure, nasal congestion, ear pain or sore throat. Denies chest congestion, productive cough or wheezing. Denies chest pains, palpitations and leg swelling Denies abdominal pain, nausea, vomiting,diarrhea or constipation.   Denies dysuria, frequency, hesitancy or incontinence. c/o increased and uncontrolled left hip and knee pain at times causing  limitation in mobility. Denies headaches, seizures, numbness, or tingling. Denies uncontrolled  depression, anxiety or insomnia. Denies skin break down or rash.       Observations/Objective: BP 134/88   Ht 5\' 2"  (1.575 m)   Wt 125 lb 1.3 oz (56.7 kg)   BMI 22.88 kg/m   Good communication with no confusion and intact memory. Alert and  oriented x 3 No signs of respiratory distress during speech    Assessment and Plan: Hypertension Controlled, no change in medication DASH diet and commitment to daily physical activity for a minimum of 30 minutes discussed and encouraged, as a part of hypertension management. The importance of attaining a healthy weight is also discussed.  BP/Weight 04/23/2019 04/22/2019 01/09/2019 12/12/2018 11/21/2018 10/08/2018 45/40/9811  Systolic BP 914 782 956 213 086 578 469  Diastolic BP 88 88 72 88 80 81 80  Wt. (Lbs) 125.08 125.08 128 128 130 129.2 129  BMI 22.88 22.88 23.41 23.41 23.78 23.63 23.59  Some encounter information is confidential and restricted. Go to Review Flowsheets activity to see all data.       Hyperlipidemia with target LDL less than 100 Hyperlipidemia:Low fat diet discussed and encouraged.   Lipid Panel  Lab Results  Component Value Date   CHOL 161 04/23/2019   HDL 65 04/23/2019   LDLCALC 81 04/23/2019   TRIG 72 04/23/2019   CHOLHDL 2.5 04/23/2019   Controlled, no change in medication     Depression, major, recurrent, in partial remission ([facility]) Controlled, no change in medication   Knee pain, left Increased pain and instability per pt report, refer Ortho  Left hip pain Increased pain and instability per report, refer ortho    Follow Up Instructions:    I discussed the assessment and treatment plan with the patient. The patient was provided an opportunity to ask questions and all were answered. The patient agreed with the plan and demonstrated an understanding of the instructions.   The patient was advised to call back or seek an in-person evaluation if the symptoms worsen or if the  condition fails to improve as anticipated.  I provided 22 minutes of non-face-to-face time during this encounter.   Tula Nakayama, MD

## 2019-04-23 NOTE — Patient Instructions (Signed)
Physical exam with MD end  September,  Also flu vaccine at that visit, call if you need me before  Mammogram appointment will be sent to you  We will contact re labs from today  You are being referred to Dr Aline Brochure re left  hip and leg pain   It is important that you exercise regularly at least 30 minutes 5 times a week. If you develop chest pain, have severe difficulty breathing, or feel very tired, stop exercising immediately and seek medical attention    Social distancing. Frequent hand washing with soap and water Keeping your hands off of your face. These 3 practices will help to keep both you and your community healthy during this time. Please practice them faithfully!  Thanks for choosing [facility], we consider it a privelige to serve you.

## 2019-04-23 NOTE — Telephone Encounter (Signed)
Therapist sent text to patient twice for scheduled appointment, no response. Therapist then called patient's home number and left message indicating clinician called.

## 2019-04-23 NOTE — Progress Notes (Signed)
Labs are great! Continue to eat a well balanced diet, get exercise and follow up as previously planned.

## 2019-04-25 ENCOUNTER — Ambulatory Visit: Payer: Medicare Other | Admitting: Family Medicine

## 2019-04-26 ENCOUNTER — Encounter: Payer: Self-pay | Admitting: Family Medicine

## 2019-04-26 DIAGNOSIS — G8929 Other chronic pain: Secondary | ICD-10-CM | POA: Insufficient documentation

## 2019-04-26 DIAGNOSIS — M25552 Pain in left hip: Secondary | ICD-10-CM | POA: Insufficient documentation

## 2019-04-26 DIAGNOSIS — M25562 Pain in left knee: Secondary | ICD-10-CM | POA: Insufficient documentation

## 2019-04-26 NOTE — Assessment & Plan Note (Signed)
Increased pain and instability per report, refer ortho

## 2019-04-26 NOTE — Assessment & Plan Note (Signed)
Hyperlipidemia:Low fat diet discussed and encouraged.   Lipid Panel  Lab Results  Component Value Date   CHOL 161 04/23/2019   HDL 65 04/23/2019   LDLCALC 81 04/23/2019   TRIG 72 04/23/2019   CHOLHDL 2.5 04/23/2019   Controlled, no change in medication

## 2019-04-26 NOTE — Assessment & Plan Note (Signed)
Controlled, no change in medication  

## 2019-04-26 NOTE — Assessment & Plan Note (Signed)
Increased pain and instability per pt report, refer Ortho

## 2019-04-26 NOTE — Progress Notes (Signed)
Pt not seen at this visit, did " not show"

## 2019-04-26 NOTE — Assessment & Plan Note (Signed)
Controlled, no change in medication DASH diet and commitment to daily physical activity for a minimum of 30 minutes discussed and encouraged, as a part of hypertension management. The importance of attaining a healthy weight is also discussed.  BP/Weight 04/23/2019 04/22/2019 01/09/2019 12/12/2018 11/21/2018 10/08/2018 72/15/8727  Systolic BP 618 485 927 639 432 003 794  Diastolic BP 88 88 72 88 80 81 80  Wt. (Lbs) 125.08 125.08 128 128 130 129.2 129  BMI 22.88 22.88 23.41 23.41 23.78 23.63 23.59  Some encounter information is confidential and restricted. Go to Review Flowsheets activity to see all data.

## 2019-04-30 ENCOUNTER — Ambulatory Visit: Payer: Self-pay | Admitting: Family Medicine

## 2019-05-02 ENCOUNTER — Ambulatory Visit (INDEPENDENT_AMBULATORY_CARE_PROVIDER_SITE_OTHER): Payer: Medicare Other | Admitting: Orthopaedic Surgery

## 2019-05-02 ENCOUNTER — Encounter: Payer: Self-pay | Admitting: Orthopaedic Surgery

## 2019-05-02 ENCOUNTER — Other Ambulatory Visit: Payer: Self-pay | Admitting: Orthopaedic Surgery

## 2019-05-02 ENCOUNTER — Ambulatory Visit (INDEPENDENT_AMBULATORY_CARE_PROVIDER_SITE_OTHER): Payer: Medicare Other

## 2019-05-02 ENCOUNTER — Other Ambulatory Visit: Payer: Self-pay

## 2019-05-02 VITALS — BP 120/99 | HR 88 | Temp 98.1°F | Ht 62.0 in | Wt 123.0 lb

## 2019-05-02 DIAGNOSIS — M25552 Pain in left hip: Secondary | ICD-10-CM

## 2019-05-02 DIAGNOSIS — G8929 Other chronic pain: Secondary | ICD-10-CM

## 2019-05-02 DIAGNOSIS — M25551 Pain in right hip: Secondary | ICD-10-CM

## 2019-05-02 MED ORDER — PREDNISONE 5 MG (21) PO TBPK: ORAL_TABLET | ORAL | 0 refills | Status: DC

## 2019-05-02 NOTE — Progress Notes (Signed)
Patient FT:Toni Miller, female DOB:Apr 07, 1961, 58 y.o. KYH:[id number]  Chief Complaint  Patient presents with  . Leg Pain    Left leg pain.    HPI  Toni Miller is a 58 y.o. female who complains of pain in the left hip and left thigh.  She had a fall about three years ago and it has been bothering her since then getting worse over the last month. She has numbness of the thigh at times. She has no weakness,no pain from the back.  She has tried heat, ice, rubs and Advil with no help. She has no trauma, no redness, no swelling.   Body mass index is 22.5 kg/m.  ROS  Review of Systems  Constitutional: Positive for activity change.  Musculoskeletal: Positive for arthralgias and gait problem.  All other systems reviewed and are negative.   All other systems reviewed and are negative.  The following is a summary of the past history medically, past history surgically, known current medicines, social history and family history.  This information is gathered electronically by the computer from prior information and documentation.  I review this each visit and have found including this information at this point in the chart is beneficial and informative.    Past Medical History:  Diagnosis Date  . Anxiety   . Arthritis   . Cancer ([place]) 1993   abnormal pap treated at [facility]  . Constipation   . Depression   . GERD (gastroesophageal reflux disease)   . Glaucoma 1993  . History of kidney stones   . Hyperlipidemia   . Hypertension   . Neck pain 7/09   WITH BULGING DISC-----RECIEVING EPIDURALS   . Sinusitis     Past Surgical History:  Procedure Laterality Date  . ABDOMINAL EXPLORATION SURGERY  age 38   bowel obstruction, [facility]  . ABDOMINAL HYSTERECTOMY  2007   [facility], [facility]  . BILATERAL EYE SURGERY     for glaucoma  . BIOPSY  01/09/2019   Procedure: BIOPSY;  Surgeon: Danie Binder, MD;  [facility];  Service: Endoscopy;;  gstric   . BREAST BIOPSY Left    benign  .  BREAST SURGERY Left 2004   left partial mastectomy, [facility]  . CATARACT EXTRACTION Right 06/04/2015  . CATARACT EXTRACTION W/PHACO  05/16/2011   Procedure: CATARACT EXTRACTION PHACO AND INTRAOCULAR LENS PLACEMENT (IOC);  Surgeon: Tonny Branch;  Location: [facility];  Service: Ophthalmology;  Laterality: Right;  . COLONOSCOPY N/A 05/17/2013   Dr. Barnie Alderman diverticulosis was noted/small internal hemorrhoids  . CYST REMOVED LEFT BREAST /BENIGN  2005   left, [facility]  . ESOPHAGOGASTRODUODENOSCOPY N/A 01/09/2019   Procedure: ESOPHAGOGASTRODUODENOSCOPY (EGD);  Surgeon: Danie Binder, MD;  [facility];  Service: Endoscopy;  Laterality: N/A;  12:45pm  . EYE SURGERY  2010, 1992 approx   bilateral  . REPAIR IMPERFORATE ANUS / ANORECTOPLASTY     per patient  . TOTAL ABDOMINAL HYSTERECTOMY W/ BILATERAL SALPINGOOPHORECTOMY  2007   [facility], [facility]  . TUBAL LIGATION  1991    Family History  Adopted: Yes  Problem Relation Age of Onset  . Alcohol abuse Mother   . Stomach cancer Mother 60  . Lung cancer Father   . Glaucoma Father   . Alcohol abuse Father   . Seizures Father   . Breast cancer Sister 52  . Breast cancer Sister 58  . Heart disease Sister   . Heart attack Sister   . Glaucoma Son   .  Glaucoma Maternal Grandfather   . Cancer Maternal Grandfather        poss leukemia  . SIDS Brother   . Hyperthyroidism Daughter   . Seizures Son        as a Sport and exercise psychologist  . Kidney disease Maternal Grandmother   . Heart disease Maternal Grandmother        Psychologist, forensic  . Leukemia Maternal Grandmother   . Colon cancer Neg Hx     Social History Social History   Tobacco Use  . Smoking status: Former Smoker    Packs/day: 3.00    Years: 2.00    Pack years: 6.00    Types: Cigarettes    Quit date: 05/12/1991    Years since quitting: 27.9  . Smokeless tobacco: Never Used  Substance Use Topics  . Alcohol use: Yes    Frequency: Never    Comment: "sometimes"  . Drug use: Never    Allergies  Allergen  Reactions  . Acyclovir And Related Rash    Current Outpatient Medications  Medication Sig Dispense Refill  . aspirin EC 81 MG tablet Take 81 mg by mouth daily.    . cholecalciferol (VITAMIN D3) 25 MCG (1000 UT) tablet Take 1,000 Units by mouth daily.    . cycloSPORINE (RESTASIS) 0.05 % ophthalmic emulsion Place 1 drop into both eyes 2 (two) times daily.    Marland Kitchen esomeprazole (NEXIUM) 40 MG capsule 1 PO 30 MINS PRIOR TO BREAKFAST AND SUPPER (Patient taking differently: Take 40 mg by mouth 2 (two) times daily before a meal. 1 PO 30 MINS PRIOR TO BREAKFAST AND SUPPER) 60 capsule 11  . fluticasone (FLONASE) 50 MCG/ACT nasal spray Place 1 spray into both nostrils daily. 16 g 6  . gabapentin (NEURONTIN) 300 MG capsule Take 1 capsule (300 mg total) by mouth 2 (two) times daily. 60 capsule 3  . hydrOXYzine (VISTARIL) 25 MG capsule TAKE 1 CAPSULE BY MOUTH THREE TIMES DAILY AS NEEDED. 30 capsule 5  . montelukast (SINGULAIR) 10 MG tablet Take 1 tablet (10 mg total) by mouth daily. 90 tablet 1  . Multiple Vitamin (MULTIVITAMIN) capsule Take 1 capsule by mouth daily.    . naproxen (NAPROSYN) 500 MG tablet Take 1 tablet (500 mg total) by mouth 2 (two) times daily with a meal. 60 tablet 5  . OVER THE COUNTER MEDICATION Place 1 application vaginally every other day. Replenish - Moisture    . phenylephrine (SUDAFED PE) 10 MG TABS tablet Take 10 mg by mouth every 4 (four) hours as needed (nasal congestion).    . polyethylene glycol powder (GLYCOLAX/MIRALAX) powder MIX 1 CAPFUL (17G) IN 8 OUNCES OF JUICE/WATER AND DRINK ONCE DAILY. (Patient taking differently: Take 17 g by mouth daily as needed for moderate constipation. MIX 1 CAPFUL (17G) IN 8 OUNCES OF JUICE/WATER AND DRINK ONCE DAILY.) 255 g 3  . predniSONE (STERAPRED UNI-PAK 21 TAB) 5 MG (21) TBPK tablet Take 6 pills first day; 5 pills second day; 4 pills third day; 3 pills fourth day; 2 pills next day and 1 pill last day. 21 tablet 0  . rosuvastatin (CRESTOR) 10  MG tablet TAKE (1) TABLET BY MOUTH AT BEDTIME. 90 tablet 1  . tizanidine (ZANAFLEX) 2 MG capsule One capsule at bedtime as needed , for neck spasm (Patient taking differently: Take 2 mg by mouth at bedtime as needed for muscle spasms. One capsule at bedtime as needed , for neck spasm) 30 capsule 3  . TRAVATAN Z 0.004 % SOLN  ophthalmic solution Place 1 drop into both eyes at bedtime.    . triamterene-hydrochlorothiazide (MAXZIDE-25) 37.5-25 MG tablet Take 1 tablet by mouth daily. 90 tablet 0  . [START ON 05/21/2019] venlafaxine XR (EFFEXOR-XR) 150 MG 24 hr capsule Take 1 capsule (150 mg total) by mouth daily with breakfast. Take one capsule daily 90 capsule 0   No current facility-administered medications for this visit.      Physical Exam  Blood pressure (!) 120/99, pulse 88, temperature 98.1 F (36.7 C), height 5\' 2"  (1.575 m), weight 123 lb (55.8 kg).  Constitutional: overall normal hygiene, normal nutrition, well developed, normal grooming, normal body habitus. Assistive device:none  Musculoskeletal: gait and station Limp left, muscle tone and strength are normal, no tremors or atrophy is present.  .  Neurological: coordination overall normal.  Deep tendon reflex/nerve stretch intact.  Sensation normal.  Cranial nerves II-XII intact.   Skin:   Normal overall no scars, lesions, ulcers or rashes. No psoriasis.  Psychiatric: Alert and oriented x 3.  Recent memory intact, remote memory unclear.  Normal mood and affect. Well groomed.  Good eye contact.  Cardiovascular: overall no swelling, no varicosities, no edema bilaterally, normal temperatures of the legs and arms, no clubbing, cyanosis and good capillary refill.  Lymphatic: palpation is normal.  Left thigh has normal exam.  Left hip has some slight tenderness but full motion.  NV intact.  Spine/Pelvis examination:  Inspection:  Overall, sacoiliac joint benign and hips nontender; without crepitus or defects.   Thoracic spine  inspection: Alignment normal without kyphosis present   Lumbar spine inspection:  Alignment  with normal lumbar lordosis, without scoliosis apparent.   Thoracic spine palpation:  without tenderness of spinal processes   Lumbar spine palpation: without tenderness of lumbar area; without tightness of lumbar muscles    Range of Motion:   Lumbar flexion, forward flexion is normal without pain or tenderness    Lumbar extension is full without pain or tenderness   Left lateral bend is normal without pain or tenderness   Right lateral bend is normal without pain or tenderness   Straight leg raising is normal  Strength & tone: normal   Stability overall normal stability  All other systems reviewed and are negative   The patient has been educated about the nature of the problem(s) and counseled on treatment options.  The patient appeared to understand what I have discussed and is in agreement with it.  Encounter Diagnosis  Name Primary?  . Chronic left hip pain Yes   X-rays were done of the left hip, reported separately.  PLAN Call if any problems.  Precautions discussed.  Continue current medications. I will begin prednisone dose pack.  Precautions discussed.  She may need MRI and x-rays of lumbar spine.  Return to clinic 2 weeks   Electronically Signed Sanjuana Kava, MD 6/25/202010:50 AM

## 2019-05-03 ENCOUNTER — Ambulatory Visit (HOSPITAL_COMMUNITY): Payer: Medicare Other | Admitting: Psychiatry

## 2019-05-16 ENCOUNTER — Encounter: Payer: Self-pay | Admitting: Orthopaedic Surgery

## 2019-05-16 ENCOUNTER — Other Ambulatory Visit: Payer: Self-pay

## 2019-05-16 ENCOUNTER — Ambulatory Visit (INDEPENDENT_AMBULATORY_CARE_PROVIDER_SITE_OTHER): Payer: Medicare Other | Admitting: Orthopaedic Surgery

## 2019-05-16 ENCOUNTER — Ambulatory Visit (INDEPENDENT_AMBULATORY_CARE_PROVIDER_SITE_OTHER): Payer: Medicare Other

## 2019-05-16 VITALS — BP 123/90 | HR 84 | Ht 62.0 in | Wt 124.5 lb

## 2019-05-16 DIAGNOSIS — M25552 Pain in left hip: Secondary | ICD-10-CM

## 2019-05-16 DIAGNOSIS — G8929 Other chronic pain: Secondary | ICD-10-CM | POA: Diagnosis not present

## 2019-05-16 MED ORDER — DICLOFENAC SODIUM 75 MG PO TBEC: 75.0000 mg | DELAYED_RELEASE_TABLET | Freq: Two times a day (BID) | ORAL | 2 refills | Status: DC

## 2019-05-16 NOTE — Progress Notes (Signed)
Patient Toni Miller, female DOB:20-Jul-1961, 58 y.o. TMH:[id number]  Chief Complaint  Patient presents with  . Hip Pain    L/ hurts alittle/ achy pain    HPI  Toni Miller is a 58 y.o. female who continues to complain of left anterior thigh pain and some left hip pain.  She said the prednisone did not help at all. She has no new trauma. She has no pain with walking She has no numbness or redness.  She has no pain to the knee. She has no swelling.  She is taking the Naprosyn and it does not help.   Body mass index is 22.77 kg/m.  ROS  Review of Systems  Constitutional: Positive for activity change.  Musculoskeletal: Positive for arthralgias and gait problem.  All other systems reviewed and are negative.   All other systems reviewed and are negative.  The following is a summary of the past history medically, past history surgically, known current medicines, social history and family history.  This information is gathered electronically by the computer from prior information and documentation.  I review this each visit and have found including this information at this point in the chart is beneficial and informative.    Past Medical History:  Diagnosis Date  . Anxiety   . Arthritis   . Cancer ([place]) 1993   abnormal pap treated at [facility]  . Constipation   . Depression   . GERD (gastroesophageal reflux disease)   . Glaucoma 1993  . History of kidney stones   . Hyperlipidemia   . Hypertension   . Neck pain 7/09   WITH BULGING DISC-----RECIEVING EPIDURALS   . Sinusitis     Past Surgical History:  Procedure Laterality Date  . ABDOMINAL EXPLORATION SURGERY  age 43   bowel obstruction, [facility]  . ABDOMINAL HYSTERECTOMY  2007   [facility], [facility]  . BILATERAL EYE SURGERY     for glaucoma  . BIOPSY  01/09/2019   Procedure: BIOPSY;  Surgeon: Danie Binder, MD;  [facility];  Service: Endoscopy;;  gstric   . BREAST BIOPSY Left    benign  . BREAST SURGERY Left 2004   left partial mastectomy, [facility]  . CATARACT EXTRACTION Right 06/04/2015  . CATARACT EXTRACTION W/PHACO  05/16/2011   Procedure: CATARACT EXTRACTION PHACO AND INTRAOCULAR LENS PLACEMENT (IOC);  Surgeon: Tonny Branch;  Location: [facility];  Service: Ophthalmology;  Laterality: Right;  . COLONOSCOPY N/A 05/17/2013   Dr. Barnie Alderman diverticulosis was noted/small internal hemorrhoids  . CYST REMOVED LEFT BREAST /BENIGN  2005   left, [facility]  . ESOPHAGOGASTRODUODENOSCOPY N/A 01/09/2019   Procedure: ESOPHAGOGASTRODUODENOSCOPY (EGD);  Surgeon: Danie Binder, MD;  [facility];  Service: Endoscopy;  Laterality: N/A;  12:45pm  . EYE SURGERY  2010, 1992 approx   bilateral  . REPAIR IMPERFORATE ANUS / ANORECTOPLASTY     per patient  . TOTAL ABDOMINAL HYSTERECTOMY W/ BILATERAL SALPINGOOPHORECTOMY  2007   [facility], [facility]  . TUBAL LIGATION  1991    Family History  Adopted: Yes  Problem Relation Age of Onset  . Alcohol abuse Mother   . Stomach cancer Mother 60  . Lung cancer Father   . Glaucoma Father   . Alcohol abuse Father   . Seizures Father   . Breast cancer Sister 74  . Breast cancer Sister 47  . Heart disease Sister   . Heart attack Sister   . Glaucoma Son   . Glaucoma Maternal Grandfather   .  Cancer Maternal Grandfather        poss leukemia  . SIDS Brother   . Hyperthyroidism Daughter   . Seizures Son        as a Sport and exercise psychologist  . Kidney disease Maternal Grandmother   . Heart disease Maternal Grandmother        Psychologist, forensic  . Leukemia Maternal Grandmother   . Colon cancer Neg Hx     Social History Social History   Tobacco Use  . Smoking status: Former Smoker    Packs/day: 3.00    Years: 2.00    Pack years: 6.00    Types: Cigarettes    Quit date: 05/12/1991    Years since quitting: 28.0  . Smokeless tobacco: Never Used  Substance Use Topics  . Alcohol use: Yes    Frequency: Never    Comment: "sometimes"  . Drug use: Never    Allergies  Allergen Reactions  . Acyclovir And Related  Rash    Current Outpatient Medications  Medication Sig Dispense Refill  . aspirin EC 81 MG tablet Take 81 mg by mouth daily.    . cholecalciferol (VITAMIN D3) 25 MCG (1000 UT) tablet Take 1,000 Units by mouth daily.    . cycloSPORINE (RESTASIS) 0.05 % ophthalmic emulsion Place 1 drop into both eyes 2 (two) times daily.    . diclofenac (VOLTAREN) 75 MG EC tablet Take 1 tablet (75 mg total) by mouth 2 (two) times daily with a meal. 60 tablet 2  . esomeprazole (NEXIUM) 40 MG capsule 1 PO 30 MINS PRIOR TO BREAKFAST AND SUPPER (Patient taking differently: Take 40 mg by mouth 2 (two) times daily before a meal. 1 PO 30 MINS PRIOR TO BREAKFAST AND SUPPER) 60 capsule 11  . fluticasone (FLONASE) 50 MCG/ACT nasal spray Place 1 spray into both nostrils daily. 16 g 6  . gabapentin (NEURONTIN) 300 MG capsule Take 1 capsule (300 mg total) by mouth 2 (two) times daily. 60 capsule 3  . hydrOXYzine (VISTARIL) 25 MG capsule TAKE 1 CAPSULE BY MOUTH THREE TIMES DAILY AS NEEDED. 30 capsule 5  . montelukast (SINGULAIR) 10 MG tablet Take 1 tablet (10 mg total) by mouth daily. 90 tablet 1  . Multiple Vitamin (MULTIVITAMIN) capsule Take 1 capsule by mouth daily.    Marland Kitchen OVER THE COUNTER MEDICATION Place 1 application vaginally every other day. Replenish - Moisture    . phenylephrine (SUDAFED PE) 10 MG TABS tablet Take 10 mg by mouth every 4 (four) hours as needed (nasal congestion).    . polyethylene glycol powder (GLYCOLAX/MIRALAX) powder MIX 1 CAPFUL (17G) IN 8 OUNCES OF JUICE/WATER AND DRINK ONCE DAILY. (Patient taking differently: Take 17 g by mouth daily as needed for moderate constipation. MIX 1 CAPFUL (17G) IN 8 OUNCES OF JUICE/WATER AND DRINK ONCE DAILY.) 255 g 3  . predniSONE (STERAPRED UNI-PAK 21 TAB) 5 MG (21) TBPK tablet Take 6 pills first day; 5 pills second day; 4 pills third day; 3 pills fourth day; 2 pills next day and 1 pill last day. (Patient not taking: Reported on 05/16/2019) 21 tablet 0  . rosuvastatin  (CRESTOR) 10 MG tablet TAKE (1) TABLET BY MOUTH AT BEDTIME. 90 tablet 1  . tizanidine (ZANAFLEX) 2 MG capsule One capsule at bedtime as needed , for neck spasm (Patient taking differently: Take 2 mg by mouth at bedtime as needed for muscle spasms. One capsule at bedtime as needed , for neck spasm) 30 capsule 3  . TRAVATAN Z 0.004 %  SOLN ophthalmic solution Place 1 drop into both eyes at bedtime.    . triamterene-hydrochlorothiazide (MAXZIDE-25) 37.5-25 MG tablet Take 1 tablet by mouth daily. 90 tablet 0  . [START ON 05/21/2019] venlafaxine XR (EFFEXOR-XR) 150 MG 24 hr capsule Take 1 capsule (150 mg total) by mouth daily with breakfast. Take one capsule daily 90 capsule 0   No current facility-administered medications for this visit.      Physical Exam  Blood pressure 123/90, pulse 84, height 5\' 2"  (1.575 m), weight 124 lb 8 oz (56.5 kg).  Constitutional: overall normal hygiene, normal nutrition, well developed, normal grooming, normal body habitus. Assistive device:none  Musculoskeletal: gait and station Limp none, muscle tone and strength are normal, no tremors or atrophy is present.  .  Neurological: coordination overall normal.  Deep tendon reflex/nerve stretch intact.  Sensation normal.  Cranial nerves II-XII intact.   Skin:   Normal overall no scars, lesions, ulcers or rashes. No psoriasis.  Psychiatric: Alert and oriented x 3.  Recent memory intact, remote memory unclear.  Normal mood and affect. Well groomed.  Good eye contact.  Cardiovascular: overall no swelling, no varicosities, no edema bilaterally, normal temperatures of the legs and arms, no clubbing, cyanosis and good capillary refill.  Right hip has full motion. NV intact.  She has no pain to gait.  Spine/Pelvis examination:  Inspection:  Overall, sacoiliac joint benign and hips nontender; without crepitus or defects.   Thoracic spine inspection: Alignment normal without kyphosis present   Lumbar spine inspection:   Alignment  with normal lumbar lordosis, without scoliosis apparent.   Thoracic spine palpation:  without tenderness of spinal processes   Lumbar spine palpation: without tenderness of lumbar area; without tightness of lumbar muscles    Range of Motion:   Lumbar flexion, forward flexion is normal without pain or tenderness    Lumbar extension is full without pain or tenderness   Left lateral bend is normal without pain or tenderness   Right lateral bend is normal without pain or tenderness   Straight leg raising is normal  Strength & tone: normal   Stability overall normal stability  Lymphatic: palpation is normal.  All other systems reviewed and are negative   X-rays were done of the lumbar spine, reported separately.  The patient has been educated about the nature of the problem(s) and counseled on treatment options.  The patient appeared to understand what I have discussed and is in agreement with it.  Encounter Diagnosis  Name Primary?  . Chronic left hip pain Yes   I was concerned about pain from the back but the x-rays are negative.  She has full ROM of the back and the hip.  I will change to diclofenac and stop the Naprosyn.  PLAN Call if any problems.  Precautions discussed.   Return to clinic 3 weeks   Electronically Signed Sanjuana Kava, MD 7/9/202010:06 AM

## 2019-05-17 ENCOUNTER — Ambulatory Visit (HOSPITAL_COMMUNITY): Payer: Medicare Other | Admitting: Psychiatry

## 2019-05-21 ENCOUNTER — Telehealth: Payer: Self-pay | Admitting: *Deleted

## 2019-05-21 ENCOUNTER — Telehealth: Payer: Self-pay | Admitting: Family Medicine

## 2019-05-21 NOTE — Telephone Encounter (Signed)
Patient called. [name] prescribed diclofenac 75mg  to her as she has inflammation in her leg/pain. She wants to know if Dr. Oneida Alar it is safe for her to take this since she has had an ulcer in the past. Please advise thanks

## 2019-05-21 NOTE — Telephone Encounter (Signed)
Please call the patient to discuss medication

## 2019-05-22 NOTE — Telephone Encounter (Signed)
Dr. Fields, please advise! 

## 2019-05-22 NOTE — Telephone Encounter (Signed)
PT is aware.

## 2019-05-22 NOTE — Telephone Encounter (Signed)
Routing to [employer]

## 2019-05-22 NOTE — Telephone Encounter (Signed)
PLEASE CALL PT. Her last EGD MAR 2020 SHOWED NO ULCERS. SHE CAN TAKE DICLOFENAC WITH FOOD OR MILK AND CONTINUE TO TAKE THE NEXIUM BID TO REDUCE RISK OF ULCERS. THE OTHER ALTERNATIVE IS TO ASK DR. KEELING FOR AN ALTERNATIVE TO NSAIDs.

## 2019-05-22 NOTE — Telephone Encounter (Signed)
Left message for pt to call.

## 2019-05-23 ENCOUNTER — Ambulatory Visit (INDEPENDENT_AMBULATORY_CARE_PROVIDER_SITE_OTHER): Payer: Medicare Other | Admitting: Psychiatry

## 2019-05-23 ENCOUNTER — Other Ambulatory Visit: Payer: Self-pay

## 2019-05-23 DIAGNOSIS — F3341 Major depressive disorder, recurrent, in partial remission: Secondary | ICD-10-CM

## 2019-05-23 NOTE — Progress Notes (Signed)
Virtual Visit via Video Note  I connected with [name] on 05/23/19 at 10:05 AM EDTby a video enabled telemedicine application and verified that I am speaking with the correct person using two identifiers.   I discussed the limitations of evaluation and management by telemedicine and the availability of in person appointments. The patient expressed understanding and agreed to proceed.   I provided 20  minutes of non-face-to-face time during this encounter.   Alonza Smoker, LCSW         THERAPIST PROGRESS NOTE  Session Time:   Thursday 05/23/2019 10:05 AM - 10:25 AM   Participation Level: Active  Behavioral Response: CasualAlertAnxious/Euthymic  Type of Therapy: Individual Therapy  Treatment Goals addressed:  learn and implement behavioral and cognitive strategies to overcome depression,   Interventions: CBT and Supportive  Summary: MINAHIL QUINLIVAN is a 58 y.o. female who is referred for services by pychiatrist Dr. Modesta Messing due to patient experiencing symptoms of depression. She presents with a history of depression, intellectual disability, hyperlipidemia, and hypertension. She is a returning patient to this clinician as she was seen about 5 years ago due to experiencing depression and anxiety. She denies any psychiatric hospitalizations. She reports increased thoughts about how she was treated during her childhood. She reports having negative  memories of her foster mother and becoming really sad along with having crying spells at night.   Patient last was seen 4 weeks ago by virtual visit. She reports doing well since last session. She maintains involvement in activity (visiting sister, performing light household tasks, doing puzzles). She reports positive self-care. She expresses sadness about recent death of her children's great-uncle. She attended funeral and reports combination of feelings. She expresses sadness about his death but also being joyful about being with his  family and friends as they have always treated her like family per her report.  This also triggered increased thoughts and memories about her foster mother and biological mother. She reports managing these well and no longer being angry or dwelling on the past. Patient is very pleased with her progress in treatment.   Suicidal/Homicidal: Nowithout intent/plan  Therapist Response: reviewed symptoms, praised and reinforce patient's continued behavioral activation, facilitated patient expressing thoughts and feelings regarding death of children's great uncle, validated and normalized feelings related to grief and loss issues, assisted patient identify ways to maintain consistent involvement in activity and consistent positive self-care, discussed patient's progress in treatment and developed stepdown plan to termination to include one more session in four weeks, assigned patient to complete handout "Mental Health Maintenance Plan" (therapist will mail to patient) in preparation for next session  Plan: Return again in 2 weeks.  Diagnosis: Axis I: MDD, Recurrent, mild        Alonza Smoker, LCSW 05/23/2019

## 2019-05-29 NOTE — Telephone Encounter (Signed)
Pt aware to call dr fields to ask if she can take dicofenac from [place] with her ulcer history

## 2019-06-06 ENCOUNTER — Ambulatory Visit: Payer: Medicare Other | Admitting: Orthopaedic Surgery

## 2019-06-11 ENCOUNTER — Ambulatory Visit (INDEPENDENT_AMBULATORY_CARE_PROVIDER_SITE_OTHER): Payer: Medicare Other | Admitting: Orthopaedic Surgery

## 2019-06-11 ENCOUNTER — Other Ambulatory Visit: Payer: Self-pay

## 2019-06-11 ENCOUNTER — Encounter: Payer: Self-pay | Admitting: Orthopaedic Surgery

## 2019-06-11 VITALS — BP 121/85 | HR 80 | Temp 98.1°F | Ht 62.0 in | Wt 125.0 lb

## 2019-06-11 DIAGNOSIS — G5603 Carpal tunnel syndrome, bilateral upper limbs: Secondary | ICD-10-CM | POA: Diagnosis not present

## 2019-06-11 DIAGNOSIS — M25552 Pain in left hip: Secondary | ICD-10-CM

## 2019-06-11 DIAGNOSIS — G8929 Other chronic pain: Secondary | ICD-10-CM | POA: Diagnosis not present

## 2019-06-11 DIAGNOSIS — M25531 Pain in right wrist: Secondary | ICD-10-CM | POA: Diagnosis not present

## 2019-06-11 DIAGNOSIS — M25532 Pain in left wrist: Secondary | ICD-10-CM | POA: Diagnosis not present

## 2019-06-11 NOTE — Progress Notes (Signed)
Patient Toni Miller, female DOB:[birth year], 59 y.o. IRJ:[id number]  Chief Complaint  Patient presents with  . Hip Pain    Chronic left hip pain.    HPI  FAIZA BANSAL is a 58 y.o. female who has chronic left hip pain and left thigh pain.  The hip pain is better.  Her thigh bothers her at times. She has no spasms, no weakness.  She has no new trauma.  She has developed a new problem:  Tingling and numbness of the fingers at night.  She awakens and has to shake her hands.  It is worse on the left side.  I will give cock-up splint to wear.   Body mass index is 22.86 kg/m.  ROS  Review of Systems  Constitutional: Positive for activity change.  Musculoskeletal: Positive for arthralgias and gait problem.  All other systems reviewed and are negative.   All other systems reviewed and are negative.  The following is a summary of the past history medically, past history surgically, known current medicines, social history and family history.  This information is gathered electronically by the computer from prior information and documentation.  I review this each visit and have found including this information at this point in the chart is beneficial and informative.    Past Medical History:  Diagnosis Date  . Anxiety   . Arthritis   . Cancer ([place]) 1993   abnormal pap treated at [facility]  . Constipation   . Depression   . GERD (gastroesophageal reflux disease)   . Glaucoma 1993  . History of kidney stones   . Hyperlipidemia   . Hypertension   . Neck pain 7/09   WITH BULGING DISC-----RECIEVING EPIDURALS   . Sinusitis     Past Surgical History:  Procedure Laterality Date  . ABDOMINAL EXPLORATION SURGERY  age 17   bowel obstruction, [facility]  . ABDOMINAL HYSTERECTOMY  2007   [facility], [facility]  . BILATERAL EYE SURGERY     for glaucoma  . BIOPSY  01/09/2019   Procedure: BIOPSY;  Surgeon: Danie Binder, MD;  [facility];  Service: Endoscopy;;  gstric   . BREAST BIOPSY  Left    benign  . BREAST SURGERY Left 2004   left partial mastectomy, [facility]  . CATARACT EXTRACTION Right 06/04/2015  . CATARACT EXTRACTION W/PHACO  05/16/2011   Procedure: CATARACT EXTRACTION PHACO AND INTRAOCULAR LENS PLACEMENT (IOC);  Surgeon: Tonny Branch;  Location: [facility];  Service: Ophthalmology;  Laterality: Right;  . COLONOSCOPY N/A 05/17/2013   Dr. Barnie Alderman diverticulosis was noted/small internal hemorrhoids  . CYST REMOVED LEFT BREAST /BENIGN  2005   left, [facility]  . ESOPHAGOGASTRODUODENOSCOPY N/A 01/09/2019   Procedure: ESOPHAGOGASTRODUODENOSCOPY (EGD);  Surgeon: Danie Binder, MD;  Location[facility];  Service: Endoscopy;  Laterality: N/A;  12:45pm  . EYE SURGERY  2010, 1992 approx   bilateral  . REPAIR IMPERFORATE ANUS / ANORECTOPLASTY     per patient  . TOTAL ABDOMINAL HYSTERECTOMY W/ BILATERAL SALPINGOOPHORECTOMY  2007   [facility], [facility]  . TUBAL LIGATION  1991    Family History  Adopted: Yes  Problem Relation Age of Onset  . Alcohol abuse Mother   . Stomach cancer Mother 38  . Lung cancer Father   . Glaucoma Father   . Alcohol abuse Father   . Seizures Father   . Breast cancer Sister 19  . Breast cancer Sister 29  . Heart disease Sister   . Heart attack Sister   .  Glaucoma Son   . Glaucoma Maternal Grandfather   . Cancer Maternal Grandfather        poss leukemia  . SIDS Brother   . Hyperthyroidism Daughter   . Seizures Son        as a Sport and exercise psychologist  . Kidney disease Maternal Grandmother   . Heart disease Maternal Grandmother        Psychologist, forensic  . Leukemia Maternal Grandmother   . Colon cancer Neg Hx     Social History Social History   Tobacco Use  . Smoking status: Former Smoker    Packs/day: 3.00    Years: 2.00    Pack years: 6.00    Types: Cigarettes    Quit date: 05/12/1991    Years since quitting: 28.1  . Smokeless tobacco: Never Used  Substance Use Topics  . Alcohol use: Yes    Frequency: Never    Comment: "sometimes"  . Drug use: Never    Allergies   Allergen Reactions  . Acyclovir And Related Rash    Current Outpatient Medications  Medication Sig Dispense Refill  . aspirin EC 81 MG tablet Take 81 mg by mouth daily.    . cholecalciferol (VITAMIN D3) 25 MCG (1000 UT) tablet Take 1,000 Units by mouth daily.    . cycloSPORINE (RESTASIS) 0.05 % ophthalmic emulsion Place 1 drop into both eyes 2 (two) times daily.    . diclofenac (VOLTAREN) 75 MG EC tablet Take 1 tablet (75 mg total) by mouth 2 (two) times daily with a meal. 60 tablet 2  . esomeprazole (NEXIUM) 40 MG capsule 1 PO 30 MINS PRIOR TO BREAKFAST AND SUPPER (Patient taking differently: Take 40 mg by mouth 2 (two) times daily before a meal. 1 PO 30 MINS PRIOR TO BREAKFAST AND SUPPER) 60 capsule 11  . fluticasone (FLONASE) 50 MCG/ACT nasal spray Place 1 spray into both nostrils daily. 16 g 6  . gabapentin (NEURONTIN) 300 MG capsule Take 1 capsule (300 mg total) by mouth 2 (two) times daily. 60 capsule 3  . hydrOXYzine (VISTARIL) 25 MG capsule TAKE 1 CAPSULE BY MOUTH THREE TIMES DAILY AS NEEDED. 30 capsule 5  . montelukast (SINGULAIR) 10 MG tablet Take 1 tablet (10 mg total) by mouth daily. 90 tablet 1  . Multiple Vitamin (MULTIVITAMIN) capsule Take 1 capsule by mouth daily.    Marland Kitchen OVER THE COUNTER MEDICATION Place 1 application vaginally every other day. Replenish - Moisture    . phenylephrine (SUDAFED PE) 10 MG TABS tablet Take 10 mg by mouth every 4 (four) hours as needed (nasal congestion).    . polyethylene glycol powder (GLYCOLAX/MIRALAX) powder MIX 1 CAPFUL (17G) IN 8 OUNCES OF JUICE/WATER AND DRINK ONCE DAILY. (Patient taking differently: Take 17 g by mouth daily as needed for moderate constipation. MIX 1 CAPFUL (17G) IN 8 OUNCES OF JUICE/WATER AND DRINK ONCE DAILY.) 255 g 3  . predniSONE (STERAPRED UNI-PAK 21 TAB) 5 MG (21) TBPK tablet Take 6 pills first day; 5 pills second day; 4 pills third day; 3 pills fourth day; 2 pills next day and 1 pill last day. (Patient not taking: Reported  on 05/16/2019) 21 tablet 0  . rosuvastatin (CRESTOR) 10 MG tablet TAKE (1) TABLET BY MOUTH AT BEDTIME. 90 tablet 1  . tizanidine (ZANAFLEX) 2 MG capsule One capsule at bedtime as needed , for neck spasm (Patient taking differently: Take 2 mg by mouth at bedtime as needed for muscle spasms. One capsule at bedtime as needed , for  neck spasm) 30 capsule 3  . TRAVATAN Z 0.004 % SOLN ophthalmic solution Place 1 drop into both eyes at bedtime.    . triamterene-hydrochlorothiazide (MAXZIDE-25) 37.5-25 MG tablet Take 1 tablet by mouth daily. 90 tablet 0  . venlafaxine XR (EFFEXOR-XR) 150 MG 24 hr capsule Take 1 capsule (150 mg total) by mouth daily with breakfast. Take one capsule daily 90 capsule 0   No current facility-administered medications for this visit.      Physical Exam  Blood pressure 121/85, pulse 80, temperature 98.1 F (36.7 C), height 5\' 2"  (1.575 m), weight 125 lb (56.7 kg).  Constitutional: overall normal hygiene, normal nutrition, well developed, normal grooming, normal body habitus. Assistive device:none  Musculoskeletal: gait and station Limp none, muscle tone and strength are normal, no tremors or atrophy is present.  .  Neurological: coordination overall normal.  Deep tendon reflex/nerve stretch intact.  Sensation normal.  Cranial nerves II-XII intact.   Skin:   Normal overall no scars, lesions, ulcers or rashes. No psoriasis.  Psychiatric: Alert and oriented x 3.  Recent memory intact, remote memory unclear.  Normal mood and affect. Well groomed.  Good eye contact.  Cardiovascular: overall no swelling, no varicosities, no edema bilaterally, normal temperatures of the legs and arms, no clubbing, cyanosis and good capillary refill.  Lymphatic: palpation is normal.  Hands have normal sensation but positive Phalens, negative Tinel.  Grips normal.  Hip has full ROM of motion bilaterally.  NV intact.  All other systems reviewed and are negative   The patient has been  educated about the nature of the problem(s) and counseled on treatment options.  The patient appeared to understand what I have discussed and is in agreement with it.  Encounter Diagnoses  Name Primary?  . Chronic left hip pain Yes  . Bilateral carpal tunnel syndrome     PLAN Call if any problems.  Precautions discussed.  Continue current medications.   Return to clinic 6 weeks   Cock-up splint given.  Electronically Signed Sanjuana Kava, MD 8/4/20209:15 AM

## 2019-06-19 ENCOUNTER — Ambulatory Visit (INDEPENDENT_AMBULATORY_CARE_PROVIDER_SITE_OTHER): Payer: Medicare Other | Admitting: Gastroenterology

## 2019-06-19 ENCOUNTER — Other Ambulatory Visit: Payer: Self-pay

## 2019-06-19 ENCOUNTER — Encounter: Payer: Self-pay | Admitting: Gastroenterology

## 2019-06-19 DIAGNOSIS — Z1211 Encounter for screening for malignant neoplasm of colon: Secondary | ICD-10-CM | POA: Diagnosis not present

## 2019-06-19 DIAGNOSIS — K5909 Other constipation: Secondary | ICD-10-CM

## 2019-06-19 DIAGNOSIS — K219 Gastro-esophageal reflux disease without esophagitis: Secondary | ICD-10-CM

## 2019-06-19 NOTE — Assessment & Plan Note (Signed)
NO WARNING SIGNS/SYMPTOMS  NEXT COLONOSCOPY JUL 2024

## 2019-06-19 NOTE — Assessment & Plan Note (Signed)
SYMPTOMS FAIRLY WELL CONTROLLED.  CONTINUE NEXIUM. TAKE 30 MINUTES BEFORE MEALS TWICE DAILY. FOLLOW UP IN 6 MOS.

## 2019-06-19 NOTE — Progress Notes (Addendum)
Subjective:    Patient ID: Toni Miller, female    DOB: Aug 14, 1961, 58 y.o.   MRN: [id number]  [name], MD   HPI Had problem with constipation and rectal bleeding after taking VITAMIN D PILL. TAKES 1/2 GLASS OF WATER WITH VITAMIN D. EATING BROWN BREAD AND BROWN RICE. VOMITED x 1 SINCE LAST VISIT. TINGLING IN FINGERS AT NIGHT. ENERGY LEVEL: PRETTY GOOD. APPETITE: GOOD. EATS SWEETS: CHOCOLATE IS A FAVORITE. EATS WATERMELON AND CANTALOUPE, WHICH MAKES HER PEE A LOT. HEARTBURN GOOD SINCE SHE'S TAKING TWO PILLS.  PT DENIES FEVER, CHILLS, HEMATOCHEZIA, HEMATEMESIS, nausea, vomiting, melena, diarrhea, CHEST PAIN, SHORTNESS OF BREATH, CHANGE IN BOWEL IN HABITS, constipation, abdominal pain, problems swallowing, problems with sedation, OR heartburn or indigestion.  Past Medical History:  Diagnosis Date  . Anxiety   . Arthritis   . Cancer ([place]) 1993   abnormal pap treated at [facility]  . Constipation   . Depression   . GERD (gastroesophageal reflux disease)   . Glaucoma 1993  . History of kidney stones   . Hyperlipidemia   . Hypertension   . Neck pain 7/09   WITH BULGING DISC-----RECIEVING EPIDURALS   . Sinusitis     Past Surgical History:  Procedure Laterality Date  . ABDOMINAL EXPLORATION SURGERY  age 82   bowel obstruction, [facility]  . ABDOMINAL HYSTERECTOMY  2007   [facility], [facility]  . BILATERAL EYE SURGERY     for glaucoma  . BIOPSY  01/09/2019   Procedure: BIOPSY;  Surgeon: Danie Binder, MD;  [facility];  Service: Endoscopy;;  gstric   . BREAST BIOPSY Left    benign  . BREAST SURGERY Left 2004   left partial mastectomy, [facility]  . CATARACT EXTRACTION Right 06/04/2015  . CATARACT EXTRACTION W/PHACO  05/16/2011   Procedure: CATARACT EXTRACTION PHACO AND INTRAOCULAR LENS PLACEMENT (IOC);  Surgeon: Tonny Branch;  Location: [facility];  Service: Ophthalmology;  Laterality: Right;  . COLONOSCOPY N/A 05/17/2013   Dr. Barnie Alderman diverticulosis was noted/small internal  hemorrhoids  . CYST REMOVED LEFT BREAST /BENIGN  2005   left, [facility]  . ESOPHAGOGASTRODUODENOSCOPY N/A 01/09/2019   Procedure: ESOPHAGOGASTRODUODENOSCOPY (EGD);  Surgeon: Danie Binder, MD;  [facility];  Service: Endoscopy;  Laterality: N/A;  12:45pm  . EYE SURGERY  2010, 1992 approx   bilateral  . REPAIR IMPERFORATE ANUS / ANORECTOPLASTY     per patient  . TOTAL ABDOMINAL HYSTERECTOMY W/ BILATERAL SALPINGOOPHORECTOMY  2007   [facility], [facility]  . TUBAL LIGATION  1991   Allergies  Allergen Reactions  . Acyclovir And Related Rash   Current Outpatient Medications  Medication Sig    . aspirin EC 81 MG tablet Take 81 mg by mouth daily.    . cholecalciferol (VITAMIN D3) 25 MCG (1000 UT) tablet Take 1,000 Units by mouth daily.    . cycloSPORINE (RESTASIS) 0.05 % ophthalmic emulsion Place 1 drop into both eyes 2 (two) times daily.    . diclofenac (VOLTAREN) 75 MG EC tablet Take 1 tablet (75 mg total) by mouth 2 (two) times daily with a meal.    . esomeprazole (NEXIUM) 40 MG capsule 1 PO 30 MINS PRIOR TO BREAKFAST AND SUPPER     . fluticasone (FLONASE) 50 MCG/ACT nasal spray Place 1 spray into both nostrils daily.    Marland Kitchen gabapentin (NEURONTIN) 300 MG capsule Take 1 capsule (300 mg total) by mouth 2 (two) times daily.    . hydrOXYzine (VISTARIL) 25 MG capsule  TAKE 1 CAPSULE BY MOUTH THREE TIMES DAILY AS NEEDED.    Marland Kitchen montelukast (SINGULAIR) 10 MG tablet Take 1 tablet (10 mg total) by mouth daily.    . Multiple Vitamin (MULTIVITAMIN) capsule Take 1 capsule by mouth daily.    Marland Kitchen OVER THE COUNTER MEDICATION Place 1 application vaginally every other day. Replenish - Moisture    . phenylephrine (SUDAFED PE) 10 MG TABS tablet Take 10 mg by mouth every 4 (four) hours as needed (nasal congestion).    . polyethylene glycol powder (GLYCOLAX/MIRALAX) powder  MIX 1 CAPFUL (17G) IN 8 OUNCES OF JUICE/WATER AND DRINK ONCE DAILY.)    . rosuvastatin (CRESTOR) 10 MG tablet TAKE (1) TABLET BY MOUTH AT BEDTIME.     . tizanidine (ZANAFLEX) 2 MG capsule One capsule at bedtime as needed , for neck spasm  Take 1-2 mg by mouth at bedtime as needed for muscle spasms.     . TRAVATAN Z 0.004 % SOLN ophthalmic solution Place 1 drop into both eyes at bedtime.    . triamterene-hydrochlorothiazide (MAXZIDE-25) 37.5-25 MG tablet Take 1 tablet by mouth daily.    Marland Kitchen venlafaxine XR (EFFEXOR-XR) 150 MG 24 hr capsule Take 1 capsule (150 mg total) by mouth daily with breakfast. Take one capsule daily    .       Review of Systems PER HPI OTHERWISE ALL SYSTEMS ARE NEGATIVE.    Objective:   Physical Exam Vitals signs reviewed.  Constitutional:      General: She is not in acute distress.    Appearance: She is well-developed.  HENT:     Head: Normocephalic and atraumatic.     Mouth/Throat:     Pharynx: No oropharyngeal exudate.  Eyes:     General: No scleral icterus.    Pupils: Pupils are equal, round, and reactive to light.  Neck:     Musculoskeletal: Normal range of motion and neck supple.  Cardiovascular:     Rate and Rhythm: Normal rate and regular rhythm.     Heart sounds: Normal heart sounds.  Pulmonary:     Effort: Pulmonary effort is normal. No respiratory distress.     Breath sounds: Normal breath sounds.  Abdominal:     General: Bowel sounds are normal. There is no distension.     Palpations: Abdomen is soft.  Musculoskeletal:     Right lower leg: No edema.     Left lower leg: No edema.  Lymphadenopathy:     Cervical: No cervical adenopathy.  Skin:    General: Skin is warm.     Findings: No rash.  Neurological:     Mental Status: She is alert and oriented to person, place, and time.     Comments: NO  NEW FOCAL DEFICITS  Psychiatric:        Mood and Affect: Mood normal.        Thought Content: Thought content normal.        Judgment: Judgment normal.       Assessment & Plan:

## 2019-06-19 NOTE — Patient Instructions (Addendum)
EAT TO LIVE AND THINKS OF FOOD AS MEDICINE.  DRINK WATER TO KEEP YOUR URINE LIGHT YELLOW.   TO IMPROVE ENERGY AND REDUCE GI SYMPTOMS, I RECOMMEND YOU READ AND FOLLOW RECOMMENDATIONS BY DR. MARK HYMAN, "10-DAY DETOX DIET".   Do not drink SODA, GATORADE, ENERGY DRINKS OR DIET SODA. AVOID HIGH FRUCTOSE CORN SYRUP. DO NOT chew SUGAR FREE GUM, OR USE ARTIFICIAL SWEETENERS. USE STEVIA AS A SWEETENER.  DO NOT EAT ENRICHED WHEAT FLOUR, PASTA, OR CEREAL.  DO NOT EAT FARM RAISED MEAT OR FISH. ONLY EAT GRASS FED BEEF OR CHICKEN OR EGGS.  CONITNUE FOLLOW UP IN 6 MOS.    High-Fiber Diet A high-fiber diet changes your normal diet to include more whole grains, legumes, fruits, and vegetables. Changes in the diet involve replacing refined carbohydrates with unrefined foods. The calorie level of the diet is essentially unchanged. The Dietary Reference Intake (recommended amount) for adult males is 38 grams per day. For adult females, it is 25 grams per day. Pregnant and lactating women should consume 28 grams of fiber per day. Fiber is the intact part of a plant that is not broken down during digestion. Functional fiber is fiber that has been isolated from the plant to provide a beneficial effect in the body. PURPOSE  Increase stool bulk.   Ease and regulate bowel movements.   Lower cholesterol.   REDUCE RISK OF COLON CANCER  INDICATIONS THAT YOU NEED MORE FIBER  Constipation and hemorrhoids.   Uncomplicated diverticulosis (intestine condition) and irritable bowel syndrome.   Weight management.   As a protective measure against hardening of the arteries (atherosclerosis), diabetes, and cancer.   GUIDELINES FOR INCREASING FIBER IN THE DIET  Start adding fiber to the diet slowly. A gradual increase of about 5 more grams (2 slices of whole-wheat bread, 2 servings of most fruits or vegetables, or 1 bowl of high-fiber cereal) per day is best. Too rapid an increase in fiber may result in  constipation, flatulence, and bloating.   Drink enough water and fluids to keep your urine clear or pale yellow. Water, juice, or caffeine-free drinks are recommended. Not drinking enough fluid may cause constipation.   Eat a variety of high-fiber foods rather than one type of fiber.   Try to increase your intake of fiber through using high-fiber foods rather than fiber pills or supplements that contain small amounts of fiber.   The goal is to change the types of food eaten. Do not supplement your present diet with high-fiber foods, but replace foods in your present diet.    INCLUDE A VARIETY OF FIBER SOURCES  Replace refined and processed grains with canned fruits with fresh fruits, and incorporate other fiber sources.   Buckwheat oats, and many fruits and vegetables are all good sources of fiber. These include: broccoli, Brussels sprouts, cabbage, cauliflower, beets, sweet potatoes, white potatoes (skin on), carrots, tomatoes, eggplant, squash, berries, fresh fruits, and dried fruits.   Bran is the fiber-rich outer coat of cereal grain, which is largely removed in refining. In whole-grain cereals, the bran remains.   You may need to include additional fruits and vegetables each day.

## 2019-06-19 NOTE — Assessment & Plan Note (Addendum)
SYMPTOMS NOT IDEALLY CONTROLLED AND EXACERBATED BY VITAMIN D PILLS.Marland Kitchen  EAT TO LIVE AND THINKS OF FOOD AS MEDICINE. DRINK WATER TO KEEP YOUR URINE LIGHT YELLOW. FOLLOW A HIGH FIBER DIET.  CONTINUE YOUR WEIGHT LOSS EFFORTS. I RECOMMEND YOU READ AND FOLLOW RECOMMENDATIONS BY DR. MARK HYMAN, "10-DAY DETOX DIET". EAT EZEKIEL BREAD. IT IS IN THE FROZEN SECTION OF THE GROCERY STORE. WHEN YOU TAKE VITAMIN D PILLS, GET 8 OZ WATER AND ADD FIBER PILLS OR POWDER WITH EACH GLASS OF WATER. Do not drink SODA, GATORADE, ENERGY DRINKS OR DIET SODA. AVOID HIGH FRUCTOSE CORN SYRUP. DO NOT chew SUGAR FREE GUM, OR USE ARTIFICIAL SWEETENERS. USE STEVIA AS A SWEETENER. DO NOT EAT ENRICHED WHEAT FLOUR, PASTA, OR CEREAL. DO NOT FARM RAISED MEAT OR FISH. ONLY WANT TO EAT GRASS FED BEEF OR CHICKEN. FOLLOW UP IN 6 MOS.

## 2019-06-19 NOTE — Progress Notes (Signed)
cc'ed to pcp °

## 2019-06-20 NOTE — Progress Notes (Signed)
ON RECALL  °

## 2019-06-25 ENCOUNTER — Other Ambulatory Visit: Payer: Self-pay

## 2019-06-25 ENCOUNTER — Ambulatory Visit (INDEPENDENT_AMBULATORY_CARE_PROVIDER_SITE_OTHER): Payer: Medicare Other | Admitting: Psychiatry

## 2019-06-25 DIAGNOSIS — F3341 Major depressive disorder, recurrent, in partial remission: Secondary | ICD-10-CM | POA: Diagnosis not present

## 2019-06-25 NOTE — Progress Notes (Signed)
Virtual Visit via Telephone Note  I connected with Toni Miller on 06/25/19 at 10:15 AM EDT  by telephone and verified that I am speaking with the correct person using two identifiers.   I discussed the limitations, risks, security and privacy concerns of performing an evaluation and management service by telephone and the availability of in person appointments. I also discussed with the patient that there may be a patient responsible charge related to this service. The patient expressed understanding and agreed to proceed.    I provided 23 minutes of non-face-to-face time during this encounter.   Alonza Smoker, LCSW        THERAPIST PROGRESS NOTE  Session Time:   Tuesday 06/25/2019 10:15 AM - 10:38 AM  Participation Level: Active  Behavioral Response: CasualAlertAnxious/Euthymic  Type of Therapy: Individual Therapy  Treatment Goals addressed:  learn and implement behavioral and cognitive strategies to overcome depression,   Interventions: CBT and Supportive  Summary: Toni Miller is a 58 y.o. female who is referred for services by pychiatrist Dr. Modesta Messing due to patient experiencing symptoms of depression. She presents with a history of depression, intellectual disability, hyperlipidemia, and hypertension. She is a returning patient to this clinician as she was seen about 5 years ago due to experiencing depression and anxiety. She denies any psychiatric hospitalizations. She reports increased thoughts about how she was treated during her childhood. She reports having negative  memories of her foster mother and becoming really sad along with having crying spells at night.   Patient last was seen 4 weeks ago by virtual visit. She reports continuing to do well  since last session. She maintains involvement in activity (visiting sister, performing light household tasks, going to church). She reports positive self-care and states her mood has been good. She is pleased with her progress  in treatment and reports managing well. She continues to have strong support from family and friends. Patient completed homework.   Suicidal/Homicidal: Nowithout intent/plan  Therapist Response: reviewed symptoms, praised and reinforced patient's continued behavioral activation and completion of homework, developed mental health maintenance plan, processed patient's feelings about termination, did termination, encouraged patient to continue seeing psychiatrist Dr. Modesta Messing for medication management and to call this practice should she need psychotherapy services in the future.    Plan: Return again in 2 weeks.  Diagnosis: Axis I: MDD, Recurrent, mild        Alonza Smoker, LCSW 06/25/2019   Outpatient Therapist Discharge Summary  Toni Miller    Mar 18, 1961   Admission Date: 03/11/2019 Discharge Date: 06/25/2019 Reason for Discharge:  Treatment completed  Diagnosis:  Axis I:  MDD   Comments:  Patient is encouraged to call this practice should she need psychotherapy services in the future.   Peggy E Bynum LCSW

## 2019-07-01 ENCOUNTER — Ambulatory Visit (HOSPITAL_COMMUNITY)
Admission: RE | Admit: 2019-07-01 | Discharge: 2019-07-01 | Disposition: A | Discharge status: Home / Self Care | Payer: Medicare Other | Source: Ambulatory Visit | Attending: Family Medicine | Admitting: Family Medicine

## 2019-07-01 ENCOUNTER — Other Ambulatory Visit: Payer: Self-pay

## 2019-07-01 DIAGNOSIS — Z1231 Encounter for screening mammogram for malignant neoplasm of breast: Secondary | ICD-10-CM | POA: Diagnosis not present
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
## 2019-07-05 ENCOUNTER — Other Ambulatory Visit: Payer: Self-pay

## 2019-07-05 DIAGNOSIS — R6889 Other general symptoms and signs: Secondary | ICD-10-CM | POA: Diagnosis not present

## 2019-07-05 DIAGNOSIS — Z20822 Contact with and (suspected) exposure to covid-19: Secondary | ICD-10-CM

## 2019-07-06 ENCOUNTER — Other Ambulatory Visit: Payer: Self-pay | Admitting: Family Medicine

## 2019-07-06 LAB — NOVEL CORONAVIRUS, NAA: SARS-CoV-2, NAA: NOT DETECTED

## 2019-07-16 ENCOUNTER — Other Ambulatory Visit: Payer: Self-pay

## 2019-07-16 ENCOUNTER — Encounter (HOSPITAL_COMMUNITY): Payer: Self-pay | Admitting: Psychiatry

## 2019-07-16 ENCOUNTER — Ambulatory Visit (INDEPENDENT_AMBULATORY_CARE_PROVIDER_SITE_OTHER): Payer: Medicare Other | Admitting: Psychiatry

## 2019-07-16 DIAGNOSIS — F3342 Major depressive disorder, recurrent, in full remission: Secondary | ICD-10-CM

## 2019-07-16 MED ORDER — VENLAFAXINE HCL ER 150 MG PO CP24: 150.0000 mg | ORAL_CAPSULE | Freq: Every day | ORAL | 1 refills | Status: DC

## 2019-07-16 NOTE — Patient Instructions (Signed)
1. Continue venlafaxine 150 mg daily 2. Next appointment : Return in March or sooner as needed

## 2019-07-23 ENCOUNTER — Ambulatory Visit (INDEPENDENT_AMBULATORY_CARE_PROVIDER_SITE_OTHER): Payer: Medicare Other | Admitting: Orthopaedic Surgery

## 2019-07-23 ENCOUNTER — Encounter: Payer: Self-pay | Admitting: Orthopaedic Surgery

## 2019-07-23 ENCOUNTER — Other Ambulatory Visit: Payer: Self-pay

## 2019-07-23 VITALS — BP 113/82 | HR 92 | Ht 62.0 in | Wt 125.0 lb

## 2019-07-23 DIAGNOSIS — G5603 Carpal tunnel syndrome, bilateral upper limbs: Secondary | ICD-10-CM

## 2019-07-23 NOTE — Progress Notes (Signed)
Patient [id number] Toni Miller, female DOB:[birth year], 58 y.o. [id number]  Chief Complaint  Patient presents with  . Carpal Tunnel    bilateral  . Hip Pain    left    HPI  Toni Miller is a 58 y.o. female who has nocturnal numbness and now having pain and numbness in the day of the hands, more on the left.  She has no trauma. She has used splints.  She has no redness or swelling.    I will get EMGs.   Body mass index is 22.86 kg/m.  ROS  Review of Systems  Constitutional: Positive for activity change.  Musculoskeletal: Positive for arthralgias and gait problem.  All other systems reviewed and are negative.   All other systems reviewed and are negative.  The following is a summary of the past history medically, past history surgically, known current medicines, social history and family history.  This information is gathered electronically by the computer from prior information and documentation.  I review this each visit and have found including this information at this point in the chart is beneficial and informative.    Past Medical History:  Diagnosis Date  . Anxiety   . Arthritis   . Cancer ([place]) 1993   abnormal pap [facility] at [facility]  . Constipation   . Depression   . GERD (gastroesophageal reflux disease)   . Glaucoma 1993  . History of kidney stones   . Hyperlipidemia   . Hypertension   . Neck pain 7/09   WITH BULGING DISC-----RECIEVING EPIDURALS   . Sinusitis     Past Surgical History:  Procedure Laterality Date  . ABDOMINAL EXPLORATION SURGERY  age 72   bowel obstruction, [facility]  . ABDOMINAL HYSTERECTOMY  2007   [facility], [facility]  . BILATERAL EYE SURGERY     for glaucoma  . BIOPSY  01/09/2019   Procedure: BIOPSY;  Surgeon: Danie Binder, MD;  [facility];  Service: Endoscopy;;  gstric   . BREAST BIOPSY Left    benign  . BREAST SURGERY Left 2004   left partial mastectomy, [facility]  . CATARACT EXTRACTION Right 06/04/2015  . CATARACT EXTRACTION  W/PHACO  05/16/2011   Procedure: CATARACT EXTRACTION PHACO AND INTRAOCULAR LENS PLACEMENT (IOC);  Surgeon: Tonny Branch;  Location: [facility];  Service: Ophthalmology;  Laterality: Right;  . COLONOSCOPY N/A 05/17/2013   Dr. Barnie Alderman diverticulosis was noted/small internal hemorrhoids  . CYST REMOVED LEFT BREAST /BENIGN  2005   left, [facility]  . ESOPHAGOGASTRODUODENOSCOPY N/A 01/09/2019   Procedure: ESOPHAGOGASTRODUODENOSCOPY (EGD);  Surgeon: Danie Binder, MD;  [facility];  Service: Endoscopy;  Laterality: N/A;  12:45pm  . EYE SURGERY  2010, 1992 approx   bilateral  . REPAIR IMPERFORATE ANUS / ANORECTOPLASTY     per patient  . TOTAL ABDOMINAL HYSTERECTOMY W/ BILATERAL SALPINGOOPHORECTOMY  2007   [facility], [facility]  . TUBAL LIGATION  1991    Family History  Adopted: Yes  Problem Relation Age of Onset  . Alcohol abuse Mother   . Stomach cancer Mother 78  . Lung cancer Father   . Glaucoma Father   . Alcohol abuse Father   . Seizures Father   . Breast cancer Sister 34  . Breast cancer Sister 92  . Heart disease Sister   . Heart attack Sister   . Glaucoma Son   . Glaucoma Maternal Grandfather   . Cancer Maternal Grandfather        poss leukemia  . SIDS  Brother   . Hyperthyroidism Daughter   . Seizures Son        as a Sport and exercise psychologist  . Kidney disease Maternal Grandmother   . Heart disease Maternal Grandmother        Psychologist, forensic  . Leukemia Maternal Grandmother   . Colon cancer Neg Hx     Social History Social History   Tobacco Use  . Smoking status: Former Smoker    Packs/day: 3.00    Years: 2.00    Pack years: 6.00    Types: Cigarettes    Quit date: 05/12/1991    Years since quitting: 28.2  . Smokeless tobacco: Never Used  Substance Use Topics  . Alcohol use: Yes    Frequency: Never    Comment: "sometimes"  . Drug use: Never    Allergies  Allergen Reactions  . Acyclovir And Related Rash    Current Outpatient Medications  Medication Sig Dispense Refill  . aspirin EC 81  MG tablet Take 81 mg by mouth daily.    . cholecalciferol (VITAMIN D3) 25 MCG (1000 UT) tablet Take 1,000 Units by mouth daily.    . cycloSPORINE (RESTASIS) 0.05 % ophthalmic emulsion Place 1 drop into both eyes 2 (two) times daily.    . diclofenac (VOLTAREN) 75 MG EC tablet Take 1 tablet (75 mg total) by mouth 2 (two) times daily with a meal. 60 tablet 2  . esomeprazole (NEXIUM) 40 MG capsule 1 PO 30 MINS PRIOR TO BREAKFAST AND SUPPER (Patient taking differently: Take 40 mg by mouth 2 (two) times daily before a meal. 1 PO 30 MINS PRIOR TO BREAKFAST AND SUPPER) 60 capsule 11  . fluticasone (FLONASE) 50 MCG/ACT nasal spray Place 1 spray into both nostrils daily. 16 g 6  . gabapentin (NEURONTIN) 300 MG capsule TAKE 1 CAPSULE BY MOUTH TWICE DAILY. 60 capsule 0  . hydrOXYzine (VISTARIL) 25 MG capsule TAKE 1 CAPSULE BY MOUTH THREE TIMES DAILY AS NEEDED. 30 capsule 5  . montelukast (SINGULAIR) 10 MG tablet Take 1 tablet (10 mg total) by mouth daily. 90 tablet 1  . Multiple Vitamin (MULTIVITAMIN) capsule Take 1 capsule by mouth daily.    Marland Kitchen OVER THE COUNTER MEDICATION Place 1 application vaginally every other day. Replenish - Moisture    . phenylephrine (SUDAFED PE) 10 MG TABS tablet Take 10 mg by mouth every 4 (four) hours as needed (nasal congestion).    . polyethylene glycol powder (GLYCOLAX/MIRALAX) powder MIX 1 CAPFUL (17G) IN 8 OUNCES OF JUICE/WATER AND DRINK ONCE DAILY. (Patient taking differently: Take 17 g by mouth daily as needed for moderate constipation. MIX 1 CAPFUL (17G) IN 8 OUNCES OF JUICE/WATER AND DRINK ONCE DAILY.) 255 g 3  . predniSONE (STERAPRED UNI-PAK 21 TAB) 5 MG (21) TBPK tablet Take 6 pills first day; 5 pills second day; 4 pills third day; 3 pills fourth day; 2 pills next day and 1 pill last day. 21 tablet 0  . rosuvastatin (CRESTOR) 10 MG tablet TAKE (1) TABLET BY MOUTH AT BEDTIME. 90 tablet 1  . tizanidine (ZANAFLEX) 2 MG capsule One capsule at bedtime as needed , for neck spasm  (Patient taking differently: Take 2 mg by mouth at bedtime as needed for muscle spasms. One capsule at bedtime as needed , for neck spasm) 30 capsule 3  . TRAVATAN Z 0.004 % SOLN ophthalmic solution Place 1 drop into both eyes at bedtime.    Marland Kitchen venlafaxine XR (EFFEXOR-XR) 150 MG 24 hr capsule Take 1 capsule (  150 mg total) by mouth daily with breakfast. Take one capsule daily 90 capsule 1  . triamterene-hydrochlorothiazide (MAXZIDE-25) 37.5-25 MG tablet      No current facility-administered medications for this visit.      Physical Exam  Blood pressure 113/82, pulse 92, height 5\' 2"  (1.575 m), weight 125 lb (56.7 kg).  Constitutional: overall normal hygiene, normal nutrition, well developed, normal grooming, normal body habitus. Assistive device:none  Musculoskeletal: gait and station Limp none, muscle tone and strength are normal, no tremors or atrophy is present.  .  Neurological: coordination overall normal.  Deep tendon reflex/nerve stretch intact.  Sensation normal.  Cranial nerves II-XII intact.   Skin:   Normal overall no scars, lesions, ulcers or rashes. No psoriasis.  Psychiatric: Alert and oriented x 3.  Recent memory intact, remote memory unclear.  Normal mood and affect. Well groomed.  Good eye contact.  Cardiovascular: overall no swelling, no varicosities, no edema bilaterally, normal temperatures of the legs and arms, no clubbing, cyanosis and good capillary refill.  Lymphatic: palpation is normal.  Both hands have positive Phalen's sign but negative Tinel.  She has decreased sensation median nerve bilaterally.  Pulses normal.  All other systems reviewed and are negative   The patient has been educated about the nature of the problem(s) and counseled on treatment options.  The patient appeared to understand what I have discussed and is in agreement with it.  Encounter Diagnosis  Name Primary?  . Bilateral carpal tunnel syndrome Yes    PLAN Call if any problems.   Precautions discussed.  Continue current medications.   Return to clinic 2 weeks  Get EMGs.   Electronically Signed Sanjuana Kava, MD 9/15/20208:54 AM

## 2019-07-25 DIAGNOSIS — G5600 Carpal tunnel syndrome, unspecified upper limb: Secondary | ICD-10-CM | POA: Diagnosis not present

## 2019-07-25 DIAGNOSIS — R209 Unspecified disturbances of skin sensation: Secondary | ICD-10-CM | POA: Diagnosis not present

## 2019-07-25 DIAGNOSIS — M79642 Pain in left hand: Secondary | ICD-10-CM | POA: Diagnosis not present

## 2019-07-25 DIAGNOSIS — G5622 Lesion of ulnar nerve, left upper limb: Secondary | ICD-10-CM | POA: Diagnosis not present

## 2019-07-31 ENCOUNTER — Other Ambulatory Visit: Payer: Self-pay | Admitting: Family Medicine

## 2019-08-07 ENCOUNTER — Other Ambulatory Visit (HOSPITAL_COMMUNITY)
Admission: RE | Admit: 2019-08-07 | Discharge: 2019-08-07 | Disposition: A | Discharge status: Home / Self Care | Payer: Medicare Other | Source: Ambulatory Visit | Attending: Family Medicine | Admitting: Family Medicine

## 2019-08-07 ENCOUNTER — Other Ambulatory Visit: Payer: Self-pay

## 2019-08-07 ENCOUNTER — Ambulatory Visit (INDEPENDENT_AMBULATORY_CARE_PROVIDER_SITE_OTHER): Payer: Medicare Other | Admitting: Family Medicine

## 2019-08-07 ENCOUNTER — Encounter: Payer: Self-pay | Admitting: Family Medicine

## 2019-08-07 VITALS — BP 142/94 | HR 95 | Temp 98.4°F | Ht 62.0 in | Wt 126.0 lb

## 2019-08-07 DIAGNOSIS — Z124 Encounter for screening for malignant neoplasm of cervix: Secondary | ICD-10-CM | POA: Diagnosis not present

## 2019-08-07 DIAGNOSIS — Z23 Encounter for immunization: Secondary | ICD-10-CM | POA: Diagnosis not present

## 2019-08-07 DIAGNOSIS — E559 Vitamin D deficiency, unspecified: Secondary | ICD-10-CM

## 2019-08-07 DIAGNOSIS — E785 Hyperlipidemia, unspecified: Secondary | ICD-10-CM | POA: Diagnosis not present

## 2019-08-07 DIAGNOSIS — Z1151 Encounter for screening for human papillomavirus (HPV): Secondary | ICD-10-CM | POA: Diagnosis not present

## 2019-08-07 DIAGNOSIS — Z0000 Encounter for general adult medical examination without abnormal findings: Secondary | ICD-10-CM

## 2019-08-07 DIAGNOSIS — I10 Essential (primary) hypertension: Secondary | ICD-10-CM

## 2019-08-07 MED ORDER — HYDROXYZINE HCL 10 MG PO TABS: ORAL_TABLET | ORAL | 1 refills | Status: DC

## 2019-08-07 MED ORDER — TRIAMTERENE-HCTZ 37.5-25 MG PO TABS: ORAL_TABLET | ORAL | 3 refills | Status: DC

## 2019-08-07 NOTE — Progress Notes (Signed)
    Toni Miller     MRN: [id number]      DOB: 1961/06/07  HPI: Patient is in for annual physical exam. No other health concerns are expressed or addressed at the visit. Recent labs, if available are reviewed. Immunization is reviewed , and  updated if needed.   PE: BP (!) 142/94   Pulse 95   Temp 98.4 F (36.9 C) (Temporal)   Ht 5\' 2"  (1.575 m)   Wt 126 lb (57.2 kg)   SpO2 97%   BMI 23.05 kg/m   Pleasant  female, alert and oriented x 3, in no cardio-pulmonary distress. Afebrile. HEENT No facial trauma or asymetry. Sinuses non tender.  Extra occullar muscles intact,  External ears normal, Neck: supple, no adenopathy,JVD or thyromegaly.No bruits.  Chest: Clear to ascultation bilaterally.No crackles or wheezes. Non tender to palpation  Breast: No asymetry,no masses or lumps. No tenderness. No nipple discharge or inversion. No axillary or supraclavicular adenopathy  Cardiovascular system; Heart sounds normal,  S1 and  S2 ,no S3.  No murmur, or thrill. Apical beat not displaced Peripheral pulses normal.  Abdomen: Soft, non tender, no organomegaly or masses. No bruits. Bowel sounds normal. No guarding, tenderness or rebound.    GU: External genitalia normal female genitalia , normal female distribution of hair. No lesions. Urethral meatus normal in size, no  Prolapse, no lesions visibly  Present. Bladder non tender. Vagina pink and moist , with no visible lesions , discharge present . Adequate pelvic support no  cystocele or rectocele noted  Uterus absent, no adnexal masses, no  adnexal tenderness.   Musculoskeletal exam: Full ROM of spine, hips , shoulders and reduced in right knee. No deformity ,swelling or crepitus noted. No muscle wasting or atrophy.   Neurologic: Cranial nerves 2 to 12 intact. Power, tone ,sensation and reflexes normal throughout. No disturbance in gait. No tremor.  Skin: Intact, no ulceration, erythema , scaling or rash  noted. 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 Judgement and concentration normal   Assessment & Plan:  Annual physical exam Annual exam as documented. Counseling done  re healthy lifestyle involving commitment to 150 minutes exercise per week, heart healthy diet, and attaining healthy weight.The importance of adequate sleep also discussed. Regular seat belt use and home safety, is also discussed. Changes in health habits are decided on by the patient with goals and time frames  set for achieving them. Immunization and cancer screening needs are specifically addressed at this visit.   Hypertension Uncontrolled , increase maxzide to one and half tabs DASH diet and commitment to daily physical activity for a minimum of 30 minutes discussed and encouraged, as a part of hypertension management. The importance of attaining a healthy weight is also discussed.  BP/Weight 08/07/2019 07/23/2019 06/19/2019 06/11/2019 05/16/2019 05/02/2019 0000000  Systolic BP [id number]  Diastolic BP 94 82 89 85 90 99 88  Wt. (Lbs) 126 125 126.8 125 124.5 123 125.08  BMI 23.05 22.86 23.19 22.86 22.77 22.5 22.88  Some encounter information is confidential and restricted. Go to Review Flowsheets activity to see all data.

## 2019-08-07 NOTE — Assessment & Plan Note (Signed)
Uncontrolled , increase maxzide to one and half tabs DASH diet and commitment to daily physical activity for a minimum of 30 minutes discussed and encouraged, as a part of hypertension management. The importance of attaining a healthy weight is also discussed.  BP/Weight 08/07/2019 07/23/2019 06/19/2019 06/11/2019 05/16/2019 05/02/2019 0000000  Systolic BP [id number]  Diastolic BP 94 82 89 85 90 99 88  Wt. (Lbs) 126 125 126.8 125 124.5 123 125.08  BMI 23.05 22.86 23.19 22.86 22.77 22.5 22.88  Some encounter information is confidential and restricted. Go to Review Flowsheets activity to see all data.

## 2019-08-07 NOTE — Assessment & Plan Note (Signed)

## 2019-08-07 NOTE — Patient Instructions (Addendum)
F/U with MD first week in December, B P re eval and lab review call if you need me sooner  Fasting lipid, cmp and EGFR and TSH and vit D in December, 3 to 7 days before  visit  Flu vaccine today  Blood pressure is high, please reduce salt, and start daily exercise for 30 minutes  Dose increase in maxzide to one and a half tablets every day  Thanks for choosing [facility], we consider it a privelige to serve you.

## 2019-08-08 DIAGNOSIS — G5601 Carpal tunnel syndrome, right upper limb: Secondary | ICD-10-CM | POA: Diagnosis not present

## 2019-08-08 DIAGNOSIS — G5602 Carpal tunnel syndrome, left upper limb: Secondary | ICD-10-CM | POA: Diagnosis not present

## 2019-08-15 ENCOUNTER — Ambulatory Visit (INDEPENDENT_AMBULATORY_CARE_PROVIDER_SITE_OTHER): Payer: Medicare Other | Admitting: Orthopaedic Surgery

## 2019-08-15 ENCOUNTER — Encounter: Payer: Self-pay | Admitting: Orthopaedic Surgery

## 2019-08-15 ENCOUNTER — Other Ambulatory Visit: Payer: Self-pay

## 2019-08-15 VITALS — BP 122/86 | HR 78 | Temp 97.1°F | Ht 62.0 in | Wt 129.0 lb

## 2019-08-15 DIAGNOSIS — G8929 Other chronic pain: Secondary | ICD-10-CM | POA: Diagnosis not present

## 2019-08-15 DIAGNOSIS — M25532 Pain in left wrist: Secondary | ICD-10-CM

## 2019-08-15 NOTE — Progress Notes (Signed)
Patient Toni Miller, female DOB:1961-01-09, 58 y.o. [id number]  Chief Complaint  Patient presents with  . bilateral wrist pain    HPI  Toni Miller is a 58 y.o. female who has wrist pain and had EMGs.  Her EMG report is not conclusive for carpal tunnel.  She continues to have those symptoms.  Her main pain is on the left wrist.  She is better.  She is using the cock-up splint and taking her medicine.  She had a concern about her EMG visit and we will address this.   Body mass index is 23.59 kg/m.  ROS  Review of Systems  Constitutional: Positive for activity change.  Musculoskeletal: Positive for arthralgias and gait problem.  All other systems reviewed and are negative.   All other systems reviewed and are negative.  The following is a summary of the past history medically, past history surgically, known current medicines, social history and family history.  This information is gathered electronically by the computer from prior information and documentation.  I review this each visit and have found including this information at this point in the chart is beneficial and informative.    Past Medical History:  Diagnosis Date  . Anxiety   . Arthritis   . Cancer ([place]) 1993   abnormal pap treated at [facility]  . Constipation   . Depression   . GERD (gastroesophageal reflux disease)   . Glaucoma 1993  . History of kidney stones   . Hot flashes, menopausal 06/12/2011  . Hyperlipidemia   . Hypertension   . Neck pain 7/09   WITH BULGING DISC-----RECIEVING EPIDURALS   . Sinusitis     Past Surgical History:  Procedure Laterality Date  . ABDOMINAL EXPLORATION SURGERY  age 60   bowel obstruction, [facility]  . ABDOMINAL HYSTERECTOMY  2007   [facility], [facility]  . BILATERAL EYE SURGERY     for glaucoma  . BIOPSY  01/09/2019   Procedure: BIOPSY;  Surgeon: Danie Binder, MD;  [facility];  Service: Endoscopy;;  gstric   . BREAST BIOPSY Left    benign  . BREAST  SURGERY Left 2004   left partial mastectomy, [facility]  . CATARACT EXTRACTION Right 06/04/2015  . CATARACT EXTRACTION W/PHACO  05/16/2011   Procedure: CATARACT EXTRACTION PHACO AND INTRAOCULAR LENS PLACEMENT (IOC);  Surgeon: Tonny Branch;  Location: [facility];  Service: Ophthalmology;  Laterality: Right;  . COLONOSCOPY N/A 05/17/2013   Dr. Barnie Alderman diverticulosis was noted/small internal hemorrhoids  . CYST REMOVED LEFT BREAST /BENIGN  2005   left, [facility]  . ESOPHAGOGASTRODUODENOSCOPY N/A 01/09/2019   Procedure: ESOPHAGOGASTRODUODENOSCOPY (EGD);  Surgeon: Danie Binder, MD;  [facility];  Service: Endoscopy;  Laterality: N/A;  12:45pm  . EYE SURGERY  2010, 1992 approx   bilateral  . REPAIR IMPERFORATE ANUS / ANORECTOPLASTY     per patient  . TOTAL ABDOMINAL HYSTERECTOMY W/ BILATERAL SALPINGOOPHORECTOMY  2007   [facility], [facility]  . TUBAL LIGATION  1991    Family History  Adopted: Yes  Problem Relation Age of Onset  . Alcohol abuse Mother   . Stomach cancer Mother 63  . Lung cancer Father   . Glaucoma Father   . Alcohol abuse Father   . Seizures Father   . Breast cancer Sister 48  . Breast cancer Sister 67  . Heart disease Sister   . Heart attack Sister   . Glaucoma Son   . Glaucoma Maternal Grandfather   . Cancer  Maternal Grandfather        poss leukemia  . SIDS Brother   . Hyperthyroidism Daughter   . Seizures Son        as a Sport and exercise psychologist  . Kidney disease Maternal Grandmother   . Heart disease Maternal Grandmother        Psychologist, forensic  . Leukemia Maternal Grandmother   . Colon cancer Neg Hx     Social History Social History   Tobacco Use  . Smoking status: Former Smoker    Packs/day: 3.00    Years: 2.00    Pack years: 6.00    Types: Cigarettes    Quit date: 05/12/1991    Years since quitting: 28.2  . Smokeless tobacco: Never Used  Substance Use Topics  . Alcohol use: Yes    Frequency: Never    Comment: "sometimes"  . Drug use: Never    Allergies  Allergen Reactions  .  Acyclovir And Related Rash    Current Outpatient Medications  Medication Sig Dispense Refill  . aspirin EC 81 MG tablet Take 81 mg by mouth daily.    . cholecalciferol (VITAMIN D3) 25 MCG (1000 UT) tablet Take 1,000 Units by mouth daily.    . cycloSPORINE (RESTASIS) 0.05 % ophthalmic emulsion Place 1 drop into both eyes 2 (two) times daily.    . diclofenac (VOLTAREN) 75 MG EC tablet Take 1 tablet (75 mg total) by mouth 2 (two) times daily with a meal. 60 tablet 2  . esomeprazole (NEXIUM) 40 MG capsule 1 PO 30 MINS PRIOR TO BREAKFAST AND SUPPER (Patient taking differently: Take 40 mg by mouth 2 (two) times daily before a meal. 1 PO 30 MINS PRIOR TO BREAKFAST AND SUPPER) 60 capsule 11  . fluticasone (FLONASE) 50 MCG/ACT nasal spray Place 1 spray into both nostrils daily. 16 g 6  . gabapentin (NEURONTIN) 300 MG capsule TAKE 1 CAPSULE BY MOUTH TWICE DAILY. 60 capsule 0  . hydrOXYzine (ATARAX/VISTARIL) 10 MG tablet Take one tablet once daily as needed , for itching 30 tablet 1  . hydrOXYzine (VISTARIL) 25 MG capsule TAKE 1 CAPSULE BY MOUTH THREE TIMES DAILY AS NEEDED. 30 capsule 5  . montelukast (SINGULAIR) 10 MG tablet Take 1 tablet (10 mg total) by mouth daily. 90 tablet 1  . Multiple Vitamin (MULTIVITAMIN) capsule Take 1 capsule by mouth daily.    Marland Kitchen OVER THE COUNTER MEDICATION Place 1 application vaginally every other day. Replenish - Moisture    . phenylephrine (SUDAFED PE) 10 MG TABS tablet Take 10 mg by mouth every 4 (four) hours as needed (nasal congestion).    . polyethylene glycol powder (GLYCOLAX/MIRALAX) powder MIX 1 CAPFUL (17G) IN 8 OUNCES OF JUICE/WATER AND DRINK ONCE DAILY. (Patient taking differently: Take 17 g by mouth daily as needed for moderate constipation. MIX 1 CAPFUL (17G) IN 8 OUNCES OF JUICE/WATER AND DRINK ONCE DAILY.) 255 g 3  . rosuvastatin (CRESTOR) 10 MG tablet TAKE (1) TABLET BY MOUTH AT BEDTIME. 90 tablet 1  . tizanidine (ZANAFLEX) 2 MG capsule One capsule at bedtime  as needed , for neck spasm (Patient taking differently: Take 2 mg by mouth at bedtime as needed for muscle spasms. One capsule at bedtime as needed , for neck spasm) 30 capsule 3  . TRAVATAN Z 0.004 % SOLN ophthalmic solution Place 1 drop into both eyes at bedtime.    . triamterene-hydrochlorothiazide (MAXZIDE-25) 37.5-25 MG tablet Take one and half tablets once daily for blood pressure 42 tablet 3  .  venlafaxine XR (EFFEXOR-XR) 150 MG 24 hr capsule Take 1 capsule (150 mg total) by mouth daily with breakfast. Take one capsule daily 90 capsule 1   No current facility-administered medications for this visit.      Physical Exam  Blood pressure 122/86, pulse 78, temperature (!) 97.1 F (36.2 C), height 5\' 2"  (1.575 m), weight 129 lb (58.5 kg).  Constitutional: overall normal hygiene, normal nutrition, well developed, normal grooming, normal body habitus. Assistive device:cock-up splint left  Musculoskeletal: gait and station Limp none, muscle tone and strength are normal, no tremors or atrophy is present.  .  Neurological: coordination overall normal.  Deep tendon reflex/nerve stretch intact.  Sensation normal.  Cranial nerves II-XII intact.   Skin:   Normal overall no scars, lesions, ulcers or rashes. No psoriasis.  Left wrist tender but full ROM and normal grip.  NV intact.  Psychiatric: Alert and oriented x 3.  Recent memory intact, remote memory unclear.  Normal mood and affect. Well groomed.  Good eye contact.  Cardiovascular: overall no swelling, no varicosities, no edema bilaterally, normal temperatures of the legs and arms, no clubbing, cyanosis and good capillary refill.  Lymphatic: palpation is normal.  All other systems reviewed and are negative   The patient has been educated about the nature of the problem(s) and counseled on treatment options.  The patient appeared to understand what I have discussed and is in agreement with it.  Encounter Diagnosis  Name Primary?  .  Wrist pain, chronic, left Yes    PLAN Call if any problems.  Precautions discussed.  Continue current medications.   Return to clinic 1 month   Electronically Signed Sanjuana Kava, MD 10/8/202012:32 PM

## 2019-08-20 ENCOUNTER — Encounter: Payer: Self-pay | Admitting: Family Medicine

## 2019-08-20 LAB — CYTOLOGY - PAP
Diagnosis: NEGATIVE
High risk HPV: NEGATIVE

## 2019-08-29 ENCOUNTER — Ambulatory Visit: Payer: Self-pay

## 2019-08-30 ENCOUNTER — Encounter: Payer: Self-pay | Admitting: Family Medicine

## 2019-08-30 ENCOUNTER — Other Ambulatory Visit: Payer: Self-pay

## 2019-08-30 ENCOUNTER — Ambulatory Visit (INDEPENDENT_AMBULATORY_CARE_PROVIDER_SITE_OTHER): Payer: Medicare Other | Admitting: Family Medicine

## 2019-08-30 VITALS — BP 127/88 | HR 75 | Resp 15 | Ht 62.0 in | Wt 126.8 lb

## 2019-08-30 DIAGNOSIS — Z0000 Encounter for general adult medical examination without abnormal findings: Secondary | ICD-10-CM | POA: Diagnosis not present

## 2019-08-30 NOTE — Progress Notes (Signed)
Subjective:   Toni Miller is a 58 y.o. female who presents for Medicare Annual (Subsequent) preventive examination.   Location of Patient: Home Location of Provider: Telehealth Consent was obtain for visit to be over via telehealth.  I verified that I am speaking with the correct person using two identifiers.   Review of Systems:   Cardiac Risk Factors include: dyslipidemia;hypertension;sedentary lifestyle     Objective:     Vitals: BP 127/88   Pulse 75   Resp 15   Ht 5\' 2"  (1.575 m)   Wt 126 lb 12.8 oz (57.5 kg)   BMI 23.19 kg/m   Body mass index is 23.19 kg/m.  Advanced Directives 08/30/2019 01/09/2019 08/27/2018 02/16/2018 08/02/2017 09/08/2016 02/29/2016  Does Patient Have a Medical Advance Directive? No No No No No No No  Would patient like information on creating a medical advance directive? No - Patient declined Yes (MAU/Ambulatory/Procedural Areas - Information given) Yes (ED - Information included in AVS) - Yes (MAU/Ambulatory/Procedural Areas - Information given) No - patient declined information No - patient declined information  Pre-existing out of facility DNR order (yellow form or pink MOST form) - - - - - - -    Tobacco Social History   Tobacco Use  Smoking Status Former Smoker  . Packs/day: 3.00  . Years: 2.00  . Pack years: 6.00  . Types: Cigarettes  . Quit date: 05/12/1991  . Years since quitting: 28.3  Smokeless Tobacco Never Used     Counseling given: Not Answered   Clinical Intake:  Pre-visit preparation completed: No  Pain : No/denies pain Pain Score: 0-No pain     Nutritional Status: BMI of 19-24  Normal Diabetes: No  How often do you need to have someone help you when you read instructions, pamphlets, or other written materials from your doctor or pharmacy?: 3 - Sometimes What is the last grade level you completed in school?: 12th  Interpreter Needed?: No     Past Medical History:  Diagnosis Date  . Anxiety   .  Arthritis   . Cancer ([place]) 1993   abnormal pap treated at [facility]  . Constipation   . Depression   . GERD (gastroesophageal reflux disease)   . Glaucoma 1993  . History of kidney stones   . Hot flashes, menopausal 06/12/2011  . Hyperlipidemia   . Hypertension   . Neck pain 7/09   WITH BULGING DISC-----RECIEVING EPIDURALS   . Sinusitis    Past Surgical History:  Procedure Laterality Date  . ABDOMINAL EXPLORATION SURGERY  age 20   bowel obstruction, [facility]  . ABDOMINAL HYSTERECTOMY  2007   [facility], [facility]  . BILATERAL EYE SURGERY     for glaucoma  . BIOPSY  01/09/2019   Procedure: BIOPSY;  Surgeon: Danie Binder, MD;  [facility];  Service: Endoscopy;;  gstric   . BREAST BIOPSY Left    benign  . BREAST SURGERY Left 2004   left partial mastectomy, [facility]  . CATARACT EXTRACTION Right 06/04/2015  . CATARACT EXTRACTION W/PHACO  05/16/2011   Procedure: CATARACT EXTRACTION PHACO AND INTRAOCULAR LENS PLACEMENT (IOC);  Surgeon: Tonny Branch;  Location: [facility];  Service: Ophthalmology;  Laterality: Right;  . COLONOSCOPY N/A 05/17/2013   Dr. Barnie Alderman diverticulosis was noted/small internal hemorrhoids  . CYST REMOVED LEFT BREAST /BENIGN  2005   left, [facility]  . ESOPHAGOGASTRODUODENOSCOPY N/A 01/09/2019   Procedure: ESOPHAGOGASTRODUODENOSCOPY (EGD);  Surgeon: Danie Binder, MD;  [facility];  Service: Endoscopy;  Laterality: N/A;  12:45pm  . EYE SURGERY  2010, 1992 approx   bilateral  . REPAIR IMPERFORATE ANUS / ANORECTOPLASTY     per patient  . TOTAL ABDOMINAL HYSTERECTOMY W/ BILATERAL SALPINGOOPHORECTOMY  2007   [facility], [facility]  . TUBAL LIGATION  1991   Family History  Adopted: Yes  Problem Relation Age of Onset  . Alcohol abuse Mother   . Stomach cancer Mother 85  . Lung cancer Father   . Glaucoma Father   . Alcohol abuse Father   . Seizures Father   . Breast cancer Sister 23  . Breast cancer Sister 74  . Heart disease Sister   . Heart attack Sister   . Glaucoma Son   .  Glaucoma Maternal Grandfather   . Cancer Maternal Grandfather        poss leukemia  . SIDS Brother   . Hyperthyroidism Daughter   . Seizures Son        as a Sport and exercise psychologist  . Kidney disease Maternal Grandmother   . Heart disease Maternal Grandmother        Psychologist, forensic  . Leukemia Maternal Grandmother   . Colon cancer Neg Hx    Social History   Socioeconomic History  . Marital status: Significant Other    Spouse name: Not on file  . Number of children: 3  . Years of education: 67  . Highest education level: 12th grade  Occupational History  . Occupation: UNEMPLOYED 2011    Comment: disability    Employer: DISABLED  Social Needs  . Financial resource strain: Not hard at all  . Food insecurity    Worry: Never true    Inability: Never true  . Transportation needs    Medical: No    Non-medical: No  Tobacco Use  . Smoking status: Former Smoker    Packs/day: 3.00    Years: 2.00    Pack years: 6.00    Types: Cigarettes    Quit date: 05/12/1991    Years since quitting: 28.3  . Smokeless tobacco: Never Used  Substance and Sexual Activity  . Alcohol use: Yes    Frequency: Never    Comment: "sometimes"  . Drug use: Never  . Sexual activity: Yes    Birth control/protection: Surgical  Lifestyle  . Physical activity    Days per week: 3 days    Minutes per session: 30 min  . Stress: Not at all  Relationships  . Social Herbalist on phone: Three times a week    Gets together: Three times a week    Attends religious service: 1 to 4 times per year    Active member of club or organization: No    Attends meetings of clubs or organizations: Never    Relationship status: Divorced  Other Topics Concern  . Not on file  Social History Narrative   Lives 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   caffien- coffee, 1 cup daily    Outpatient Encounter Medications as of 08/30/2019  Medication Sig  . aspirin EC 81 MG tablet Take 81 mg by mouth daily.  . cholecalciferol (VITAMIN D3)  25 MCG (1000 UT) tablet Take 1,000 Units by mouth daily.  . cycloSPORINE (RESTASIS) 0.05 % ophthalmic emulsion Place 1 drop into both eyes 2 (two) times daily.  . diclofenac (VOLTAREN) 75 MG EC tablet Take 1 tablet (75 mg total) by mouth 2 (two) times daily with a meal.  . esomeprazole (  NEXIUM) 40 MG capsule 1 PO 30 MINS PRIOR TO BREAKFAST AND SUPPER (Patient taking differently: Take 40 mg by mouth 2 (two) times daily before a meal. 1 PO 30 MINS PRIOR TO BREAKFAST AND SUPPER)  . fluticasone (FLONASE) 50 MCG/ACT nasal spray Place 1 spray into both nostrils daily.  Marland Kitchen gabapentin (NEURONTIN) 300 MG capsule TAKE 1 CAPSULE BY MOUTH TWICE DAILY.  . hydrOXYzine (ATARAX/VISTARIL) 10 MG tablet Take one tablet once daily as needed , for itching  . hydrOXYzine (VISTARIL) 25 MG capsule TAKE 1 CAPSULE BY MOUTH THREE TIMES DAILY AS NEEDED.  Marland Kitchen montelukast (SINGULAIR) 10 MG tablet Take 1 tablet (10 mg total) by mouth daily.  . Multiple Vitamin (MULTIVITAMIN) capsule Take 1 capsule by mouth daily.  Marland Kitchen OVER THE COUNTER MEDICATION Place 1 application vaginally every other day. Replenish - Moisture  . phenylephrine (SUDAFED PE) 10 MG TABS tablet Take 10 mg by mouth every 4 (four) hours as needed (nasal congestion).  . polyethylene glycol powder (GLYCOLAX/MIRALAX) powder MIX 1 CAPFUL (17G) IN 8 OUNCES OF JUICE/WATER AND DRINK ONCE DAILY. (Patient taking differently: Take 17 g by mouth daily as needed for moderate constipation. MIX 1 CAPFUL (17G) IN 8 OUNCES OF JUICE/WATER AND DRINK ONCE DAILY.)  . rosuvastatin (CRESTOR) 10 MG tablet TAKE (1) TABLET BY MOUTH AT BEDTIME.  . tizanidine (ZANAFLEX) 2 MG capsule One capsule at bedtime as needed , for neck spasm (Patient taking differently: Take 2 mg by mouth at bedtime as needed for muscle spasms. One capsule at bedtime as needed , for neck spasm)  . TRAVATAN Z 0.004 % SOLN ophthalmic solution Place 1 drop into both eyes at bedtime.  . triamterene-hydrochlorothiazide  (MAXZIDE-25) 37.5-25 MG tablet Take one and half tablets once daily for blood pressure  . venlafaxine XR (EFFEXOR-XR) 150 MG 24 hr capsule Take 1 capsule (150 mg total) by mouth daily with breakfast. Take one capsule daily   No facility-administered encounter medications on file as of 08/30/2019.     Activities of Daily Living In your present state of health, do you have any difficulty performing the following activities: 08/30/2019  Hearing? N  Vision? N  Difficulty concentrating or making decisions? Y  Walking or climbing stairs? N  Dressing or bathing? N  Doing errands, shopping? Y  Some recent data might be hidden    Patient Care Team: [name], MD as PCP - General Danie Binder, MD as Attending Physician (Gastroenterology) Florian Buff, MD as Consulting Physician (Obstetrics and Gynecology)    Assessment:   This is a routine wellness examination for Jannessa.  Exercise Activities and Dietary recommendations Current Exercise Habits: The patient does not participate in regular exercise at present, Exercise limited by: psychological condition(s)  Goals    . DIET - DECREASE SODA OR JUICE INTAKE    . DIET - REDUCE SUGAR INTAKE    . Exercise 3x per week (30 min per time)     Recommend starting a routine exercise program at least 3 days a week for 30-45 minutes at a time as tolerated.         Fall Risk Fall Risk  08/30/2019 08/07/2019 04/23/2019 04/22/2019 02/28/2019  Falls in the past year? 1 0 0 0 0  Number falls in past yr: 0 0 - - -  Injury with Fall? 1 0 0 0 0  Risk for fall due to : Impaired balance/gait - - - -  Follow up Falls evaluation completed;Education provided - - - -  Is the patient's home free of loose throw rugs in walkways, pet beds, electrical cords, etc?   yes      Grab bars in the bathroom? no      Handrails on the stairs?   no      Adequate lighting?   no    Depression Screen PHQ 2/9 Scores 08/30/2019 04/23/2019 04/22/2019 02/28/2019   PHQ - 2 Score 0 1 1 0  PHQ- 9 Score - - - -     Cognitive Function MMSE - Mini Mental State Exam 10/08/2018 08/28/2018  Orientation to time 5 4  Orientation to Place 4 5  Registration 3 3  Attention/ Calculation 0 0  Recall 2 3  Language- name 2 objects 2 2  Language- repeat 0 1  Language- follow 3 step command 3 3  Language- read & follow direction 1 1  Write a sentence 1 0  Copy design 0 0  Total score 21 22     6CIT Screen 08/27/2018 08/02/2017  What Year? 0 points 0 points  What month? 0 points 0 points  What time? 0 points 0 points  Count back from 20 2 points 0 points  Months in reverse 4 points 0 points  Repeat phrase 4 points 0 points  Total Score 10 0    Immunization History  Administered Date(s) Administered  . Influenza Split 07/27/2011, 07/26/2012, 08/16/2016  . Influenza Whole 01/08/2008, 08/07/2008  . Influenza,inj,Quad PF,6+ Mos 08/21/2013, 08/11/2014, 07/28/2015, 07/27/2018, 08/07/2019  . Td 05/25/2004  . Tdap 02/24/2015  . Zoster Recombinat (Shingrix) 02/20/2018    Qualifies for Shingles Vaccine? Completed/partial  Screening Tests Health Maintenance  Topic Date Due  . MAMMOGRAM  06/30/2021  . PAP SMEAR-Modifier  08/06/2022  . COLONOSCOPY  05/18/2023  . TETANUS/TDAP  02/23/2025  . INFLUENZA VACCINE  Completed  . Hepatitis C Screening  Completed  . HIV Screening  Completed    Cancer Screenings: Lung: Low Dose CT Chest recommended if Age 26-80 years, 30 pack-year currently smoking OR have quit w/in 15years. Patient does not qualify. Breast:  Up to date on Mammogram? Yes   Up to date of Bone Density/Dexa? n/a Colorectal: Due 2024  Additional Screenings:  Hepatitis C Screening: completed     Plan:    1. Encounter for Medicare annual wellness exam    I have personally reviewed and noted the following in the patient's chart:   . Medical and social history . Use of alcohol, tobacco or illicit drugs  . Current medications and supplements  . Functional ability and status . Nutritional status . Physical activity . Advanced directives . List of other physicians . Hospitalizations, surgeries, and ER visits in previous 12 months . Vitals . Screenings to include cognitive, depression, and falls . Referrals and appointments  In addition, I have reviewed and discussed with patient certain preventive protocols, quality metrics, and best practice recommendations. A written personalized care plan for preventive services as well as general preventive health recommendations were provided to patient.     I provided 20 minutes of non-face-to-face time during this encounter.   Perlie Mayo, NP  08/30/2019

## 2019-08-30 NOTE — Patient Instructions (Addendum)
Ms. Kromer , Thank you for taking time to come for your Medicare Wellness Visit. I appreciate your ongoing commitment to your health goals. Please review the following plan we discussed and let me know if I can assist you in the future.   Please continue to practice social distancing to keep you, your family, and our community safe.  If you must go out, please wear a Mask and practice good handwashing.  Screening recommendations/referrals: Colonoscopy: Due 2024 Mammogram: up to date Bone Density: not due yet Recommended yearly ophthalmology/optometry visit for glaucoma screening and checkup Recommended yearly dental visit for hygiene and checkup  Vaccinations: Influenza vaccine: up to date Pneumococcal vaccine: will get in coming years Tdap vaccine: up to date Shingles vaccine:  Will get in coming years   Advanced directives: Do no have at this time, but you reported children know wishes  Conditions/risks identified: Fall  Next appointment: 10/09/2019   Preventive Care 40-64 Years, Female Preventive care refers to lifestyle choices and visits with your health care provider that can promote health and wellness. What does preventive care include?  A yearly physical exam. This is also called an annual well check.  Dental exams once or twice a year.  Routine eye exams. Ask your health care provider how often you should have your eyes checked.  Personal lifestyle choices, including:  Daily care of your teeth and gums.  Regular physical activity.  Eating a healthy diet.  Avoiding tobacco and drug use.  Limiting alcohol use.  Practicing safe sex.  Taking low-dose aspirin daily starting at age 45.  Taking vitamin and mineral supplements as recommended by your health care provider. What happens during an annual well check? The services and screenings done by your health care provider during your annual well check will depend on your age, overall health, lifestyle risk  factors, and family history of disease. Counseling  Your health care provider may ask you questions about your:  Alcohol use.  Tobacco use.  Drug use.  Emotional well-being.  Home and relationship well-being.  Sexual activity.  Eating habits.  Work and work Statistician.  Method of birth control.  Menstrual cycle.  Pregnancy history. Screening  You may have the following tests or measurements:  Height, weight, and BMI.  Blood pressure.  Lipid and cholesterol levels. These may be checked every 5 years, or more frequently if you are over 9 years old.  Skin check.  Lung cancer screening. You may have this screening every year starting at age 78 if you have a 30-pack-year history of smoking and currently smoke or have quit within the past 15 years.  Fecal occult blood test (FOBT) of the stool. You may have this test every year starting at age 68.  Flexible sigmoidoscopy or colonoscopy. You may have a sigmoidoscopy every 5 years or a colonoscopy every 10 years starting at age 55.  Hepatitis C blood test.  Hepatitis B blood test.  Sexually transmitted disease (STD) testing.  Diabetes screening. This is done by checking your blood sugar (glucose) after you have not eaten for a while (fasting). You may have this done every 1-3 years.  Mammogram. This may be done every 1-2 years. Talk to your health care provider about when you should start having regular mammograms. This may depend on whether you have a family history of breast cancer.  BRCA-related cancer screening. This may be done if you have a family history of breast, ovarian, tubal, or peritoneal cancers.  Pelvic exam and Pap  test. This may be done every 3 years starting at age 67. Starting at age 14, this may be done every 5 years if you have a Pap test in combination with an HPV test.  Bone density scan. This is done to screen for osteoporosis. You may have this scan if you are at high risk for  osteoporosis. Discuss your test results, treatment options, and if necessary, the need for more tests with your health care provider. Vaccines  Your health care provider may recommend certain vaccines, such as:  Influenza vaccine. This is recommended every year.  Tetanus, diphtheria, and acellular pertussis (Tdap, Td) vaccine. You may need a Td booster every 10 years.  Zoster vaccine. You may need this after age 62.  Pneumococcal 13-valent conjugate (PCV13) vaccine. You may need this if you have certain conditions and were not previously vaccinated.  Pneumococcal polysaccharide (PPSV23) vaccine. You may need one or two doses if you smoke cigarettes or if you have certain conditions. Talk to your health care provider about which screenings and vaccines you need and how often you need them. This information is not intended to replace advice given to you by your health care provider. Make sure you discuss any questions you have with your health care provider. Document Released: 11/20/2015 Document Revised: 07/13/2016 Document Reviewed: 08/25/2015 Elsevier Interactive Patient Education  2017 [place] Prevention in the Home Falls can cause injuries. They can happen to people of all ages. There are many things you can do to make your home safe and to help prevent falls. What can I do on the outside of my home?  Regularly fix the edges of walkways and driveways and fix any cracks.  Remove anything that might make you trip as you walk through a door, such as a raised step or threshold.  Trim any bushes or trees on the path to your home.  Use bright outdoor lighting.  Clear any walking paths of anything that might make someone trip, such as rocks or tools.  Regularly check to see if handrails are loose or broken. Make sure that both sides of any steps have handrails.  Any raised decks and porches should have guardrails on the edges.  Have any leaves, snow, or ice cleared  regularly.  Use sand or salt on walking paths during winter.  Clean up any spills in your garage right away. This includes oil or grease spills. What can I do in the bathroom?  Use night lights.  Install grab bars by the toilet and in the tub and shower. Do not use towel bars as grab bars.  Use non-skid mats or decals in the tub or shower.  If you need to sit down in the shower, use a plastic, non-slip stool.  Keep the floor dry. Clean up any water that spills on the floor as soon as it happens.  Remove soap buildup in the tub or shower regularly.  Attach bath mats securely with double-sided non-slip rug tape.  Do not have throw rugs and other things on the floor that can make you trip. What can I do in the bedroom?  Use night lights.  Make sure that you have a light by your bed that is easy to reach.  Do not use any sheets or blankets that are too big for your bed. They should not hang down onto the floor.  Have a firm chair that has side arms. You can use this for support while you get  dressed.  Do not have throw rugs and other things on the floor that can make you trip. What can I do in the kitchen?  Clean up any spills right away.  Avoid walking on wet floors.  Keep items that you use a lot in easy-to-reach places.  If you need to reach something above you, use a strong step stool that has a grab bar.  Keep electrical cords out of the way.  Do not use floor polish or wax that makes floors slippery. If you must use wax, use non-skid floor wax.  Do not have throw rugs and other things on the floor that can make you trip. What can I do with my stairs?  Do not leave any items on the stairs.  Make sure that there are handrails on both sides of the stairs and use them. Fix handrails that are broken or loose. Make sure that handrails are as long as the stairways.  Check any carpeting to make sure that it is firmly attached to the stairs. Fix any carpet that is loose  or worn.  Avoid having throw rugs at the top or bottom of the stairs. If you do have throw rugs, attach them to the floor with carpet tape.  Make sure that you have a light switch at the top of the stairs and the bottom of the stairs. If you do not have them, ask someone to add them for you. What else can I do to help prevent falls?  Wear shoes that:  Do not have high heels.  Have rubber bottoms.  Are comfortable and fit you well.  Are closed at the toe. Do not wear sandals.  If you use a stepladder:  Make sure that it is fully opened. Do not climb a closed stepladder.  Make sure that both sides of the stepladder are locked into place.  Ask someone to hold it for you, if possible.  Clearly mark and make sure that you can see:  Any grab bars or handrails.  First and last steps.  Where the edge of each step is.  Use tools that help you move around (mobility aids) if they are needed. These include:  Canes.  Walkers.  Scooters.  Crutches.  Turn on the lights when you go into a dark area. Replace any light bulbs as soon as they burn out.  Set up your furniture so you have a clear path. Avoid moving your furniture around.  If any of your floors are uneven, fix them.  If there are any pets around you, be aware of where they are.  Review your medicines with your doctor. Some medicines can make you feel dizzy. This can increase your chance of falling. Ask your doctor what other things that you can do to help prevent falls. This information is not intended to replace advice given to you by your health care provider. Make sure you discuss any questions you have with your health care provider. Document Released: 08/20/2009 Document Revised: 03/31/2016 Document Reviewed: 11/28/2014 Elsevier Interactive Patient Education  2017 [employer].

## 2019-09-04 ENCOUNTER — Other Ambulatory Visit: Payer: Self-pay | Admitting: Family Medicine

## 2019-09-06 ENCOUNTER — Other Ambulatory Visit: Payer: Self-pay

## 2019-09-06 DIAGNOSIS — Z20822 Contact with and (suspected) exposure to covid-19: Secondary | ICD-10-CM

## 2019-09-07 LAB — NOVEL CORONAVIRUS, NAA: SARS-CoV-2, NAA: NOT DETECTED

## 2019-09-12 ENCOUNTER — Ambulatory Visit: Payer: Medicare Other | Admitting: Orthopaedic Surgery

## 2019-09-17 ENCOUNTER — Other Ambulatory Visit: Payer: Self-pay

## 2019-09-17 MED ORDER — ROSUVASTATIN CALCIUM 10 MG PO TABS: ORAL_TABLET | ORAL | 1 refills | Status: DC

## 2019-09-18 ENCOUNTER — Other Ambulatory Visit: Payer: Self-pay | Admitting: Family Medicine

## 2019-09-24 ENCOUNTER — Encounter: Payer: Self-pay | Admitting: Orthopaedic Surgery

## 2019-09-24 ENCOUNTER — Ambulatory Visit: Payer: Medicare Other | Admitting: Orthopaedic Surgery

## 2019-10-08 DIAGNOSIS — E559 Vitamin D deficiency, unspecified: Secondary | ICD-10-CM | POA: Diagnosis not present

## 2019-10-08 DIAGNOSIS — E785 Hyperlipidemia, unspecified: Secondary | ICD-10-CM | POA: Diagnosis not present

## 2019-10-08 DIAGNOSIS — I10 Essential (primary) hypertension: Secondary | ICD-10-CM | POA: Diagnosis not present

## 2019-10-09 ENCOUNTER — Telehealth: Payer: Self-pay | Admitting: *Deleted

## 2019-10-09 ENCOUNTER — Other Ambulatory Visit: Payer: Self-pay

## 2019-10-09 ENCOUNTER — Encounter: Payer: Self-pay | Admitting: Family Medicine

## 2019-10-09 ENCOUNTER — Ambulatory Visit (INDEPENDENT_AMBULATORY_CARE_PROVIDER_SITE_OTHER): Payer: Medicare Other | Admitting: Family Medicine

## 2019-10-09 VITALS — BP 120/78 | HR 96 | Temp 98.7°F | Resp 15 | Ht 62.0 in | Wt 127.0 lb

## 2019-10-09 DIAGNOSIS — M5442 Lumbago with sciatica, left side: Secondary | ICD-10-CM

## 2019-10-09 DIAGNOSIS — N951 Menopausal and female climacteric states: Secondary | ICD-10-CM

## 2019-10-09 DIAGNOSIS — I10 Essential (primary) hypertension: Secondary | ICD-10-CM

## 2019-10-09 DIAGNOSIS — E785 Hyperlipidemia, unspecified: Secondary | ICD-10-CM

## 2019-10-09 LAB — COMPLETE METABOLIC PANEL WITH GFR
AG Ratio: 1.5 (calc) (ref 1.0–2.5)
ALT: 30 U/L — ABNORMAL HIGH (ref 6–29)
AST: 24 U/L (ref 10–35)
Albumin: 4.5 g/dL (ref 3.6–5.1)
Alkaline phosphatase (APISO): 63 U/L (ref 37–153)
BUN: 24 mg/dL (ref 7–25)
CO2: 32 mmol/L (ref 20–32)
Calcium: 9.7 mg/dL (ref 8.6–10.4)
Chloride: 99 mmol/L (ref 98–110)
Creat: 1.04 mg/dL (ref 0.50–1.05)
GFR, Est African American: 70 mL/min/{1.73_m2} (ref >=60)
GFR, Est Non African American: 60 mL/min/{1.73_m2} (ref >=60)
Globulin: 3 g/dL (calc) (ref 1.9–3.7)
Glucose, Bld: 88 mg/dL (ref 65–99)
Potassium: 4 mmol/L (ref 3.5–5.3)
Sodium: 138 mmol/L (ref 135–146)
Total Bilirubin: 0.4 mg/dL (ref 0.2–1.2)
Total Protein: 7.5 g/dL (ref 6.1–8.1)

## 2019-10-09 LAB — LIPID PANEL
Cholesterol: 184 mg/dL (ref <200)
HDL: 59 mg/dL (ref >=50)
LDL Cholesterol (Calc): 101 mg/dL (calc) — ABNORMAL HIGH
Non-HDL Cholesterol (Calc): 125 mg/dL (calc) (ref <130)
Total CHOL/HDL Ratio: 3.1 (calc) (ref <5.0)
Triglycerides: 143 mg/dL (ref <150)

## 2019-10-09 LAB — TSH: TSH: 3.05 mIU/L (ref 0.40–4.50)

## 2019-10-09 LAB — VITAMIN D 25 HYDROXY (VIT D DEFICIENCY, FRACTURES): Vit D, 25-Hydroxy: 28 ng/mL — ABNORMAL LOW (ref 30–100)

## 2019-10-09 MED ORDER — ESTROGENS, CONJUGATED 0.625 MG/GM VA CREA: TOPICAL_CREAM | VAGINAL | 2 refills | Status: DC

## 2019-10-09 MED ORDER — KETOROLAC TROMETHAMINE 60 MG/2ML IM SOLN
60.0000 mg | Freq: Once | INTRAMUSCULAR | Status: AC
  Administered 2019-10-09: 60 mg via INTRAMUSCULAR

## 2019-10-09 MED ORDER — TRIAMTERENE-HCTZ 37.5-25 MG PO TABS: ORAL_TABLET | ORAL | 3 refills | Status: DC

## 2019-10-09 MED ORDER — CALCIUM CARBONATE-VITAMIN D 500-400 MG-UNIT PO TABS: 1.0000 | ORAL_TABLET | Freq: Two times a day (BID) | ORAL | 3 refills | Status: DC

## 2019-10-09 NOTE — Assessment & Plan Note (Signed)
Hyperlipidemia:Low fat diet discussed and encouraged.   Lipid Panel  Lab Results  Component Value Date   CHOL 184 10/08/2019   HDL 59 10/08/2019   LDLCALC 101 (H) 10/08/2019   TRIG 143 10/08/2019   CHOLHDL 3.1 10/08/2019   Controlled, no change in medication

## 2019-10-09 NOTE — Telephone Encounter (Signed)
Pt needs the premarin cream that dr simpson was going to call in today. She went to [place] but they said something about it being out of stock. It can be sent to [facility] in [place] if  [place] have it

## 2019-10-09 NOTE — Assessment & Plan Note (Signed)
Controlled, no change in medication DASH diet and commitment to daily physical activity for a minimum of 30 minutes discussed and encouraged, as a part of hypertension management. The importance of attaining a healthy weight is also discussed.  BP/Weight 10/09/2019 08/30/2019 08/15/2019 08/07/2019 07/23/2019 [id number]  Systolic BP 123456 [id number] A999333 123456 [id number]  Diastolic BP 78 88 86 94 82 89 85  Wt. (Lbs) 127 126.8 129 126 125 126.8 125  BMI 23.23 23.19 23.59 23.05 22.86 23.19 22.86  Some encounter information is confidential and restricted. Go to Review Flowsheets activity to see all data.

## 2019-10-09 NOTE — Telephone Encounter (Signed)
Faxed to [facility]

## 2019-10-09 NOTE — Assessment & Plan Note (Signed)
Uncontrolled.Toradol 60 mg  administered IM in the office ,

## 2019-10-09 NOTE — Patient Instructions (Addendum)
Annual physical exam in office with MD end February, call if you need me before  Blood pressure is good, continue med as currently taking  New medication is premarin cream, stop Replens please  New for bones is [place] d one twice daily  Injection of Toradol 60 mg IM today for left buttock and thigh pain

## 2019-10-09 NOTE — Progress Notes (Signed)
   Toni Miller     MRN: [id number]      DOB: 04-02-1961   HPI Toni Miller is here for follow up and re-evaluation of chronic medical conditions,in particular uncontrolled hypertension medication management and review of any available recent lab and radiology data.  Preventive health is updated, specifically  Cancer screening and Immunization.   Replens applicator causes bleeding every time she uses it,on avearge once daily,  and still has bleeding with intercourse and vaginal dryness 3 day h/o flare left buttock and thigh pain, rated at 5 requests injection for this  ROS Denies recent fever or chills. Denies sinus pressure, nasal congestion, ear pain or sore throat. Denies chest congestion, productive cough or wheezing. Denies chest pains, palpitations and leg swelling Denies abdominal pain, nausea, vomiting,diarrhea or constipation.   Denies dysuria, frequency, hesitancy or incontineDenies joint pain, swelling and limitation in mobility. Denies headaches, seizures, numbness, or tingling. Denies depression c/o mild anxiety or insomnia. Denies skin break down or rash.   PE  BP 120/78   Pulse 96   Temp 98.7 F (37.1 C) (Temporal)   Resp 15   Ht 5\' 2"  (1.575 m)   Wt 127 lb (57.6 kg)   SpO2 98%   BMI 23.23 kg/m   Patient alert and oriented and in no cardiopulmonary distress.  HEENT: No facial asymmetry, EOMI,     Neck supple .  Chest: Clear to auscultation bilaterally.  CVS: S1, S2 no murmurs, no S3.Regular rate.  ABD: Soft non tender.   Ext: No edema  MS: Decreased  ROM lumbar  Spine adequate in  shoulders, hips and knees.  Skin: Intact, no ulcerations or rash noted.  Psych: Good eye contact, normal affect. Memory intact not anxious or depressed appearing.  CNS: CN 2-12 intact, power,  normal throughout.no focal deficits noted.   Assessment & Plan  Hypertension Controlled, no change in medication DASH diet and commitment to daily physical activity for a  minimum of 30 minutes discussed and encouraged, as a part of hypertension management. The importance of attaining a healthy weight is also discussed.  BP/Weight 10/09/2019 08/30/2019 08/15/2019 08/07/2019 07/23/2019 [id number]  Systolic BP 123456 [id number] A999333 123456 [id number]  Diastolic BP 78 88 86 94 82 89 85  Wt. (Lbs) 127 126.8 129 126 125 126.8 125  BMI 23.23 23.19 23.59 23.05 22.86 23.19 22.86  Some encounter information is confidential and restricted. Go to Review Flowsheets activity to see all data.       Hyperlipidemia with target LDL less than 100 Hyperlipidemia:Low fat diet discussed and encouraged.   Lipid Panel  Lab Results  Component Value Date   CHOL 184 10/08/2019   HDL 59 10/08/2019   LDLCALC 101 (H) 10/08/2019   TRIG 143 10/08/2019   CHOLHDL 3.1 10/08/2019   Controlled, no change in medication    Menopausal vaginal dryness No response to Replens, topical premarin sparingly , 3 times weekly  Low back pain with left-sided sciatica Uncontrolled.Toradol 60 mg  administered IM in the office ,

## 2019-10-09 NOTE — Assessment & Plan Note (Signed)
No response to Replens, topical premarin sparingly , 3 times weekly

## 2019-10-11 DIAGNOSIS — H401112 Primary open-angle glaucoma, right eye, moderate stage: Secondary | ICD-10-CM | POA: Diagnosis not present

## 2019-10-30 ENCOUNTER — Other Ambulatory Visit: Payer: Self-pay | Admitting: Family Medicine

## 2019-12-09 ENCOUNTER — Encounter: Payer: Self-pay | Admitting: Gastroenterology

## 2019-12-19 ENCOUNTER — Other Ambulatory Visit: Payer: Self-pay

## 2019-12-19 ENCOUNTER — Ambulatory Visit (INDEPENDENT_AMBULATORY_CARE_PROVIDER_SITE_OTHER): Payer: Medicare Other | Admitting: Gastroenterology

## 2019-12-19 ENCOUNTER — Encounter: Payer: Self-pay | Admitting: Gastroenterology

## 2019-12-19 DIAGNOSIS — K5909 Other constipation: Secondary | ICD-10-CM

## 2019-12-19 DIAGNOSIS — K219 Gastro-esophageal reflux disease without esophagitis: Secondary | ICD-10-CM | POA: Diagnosis not present

## 2019-12-19 NOTE — Assessment & Plan Note (Signed)
SYMPTOMS FAIRLY WELL CONTROLLED.  CONTINUE TO MONITOR SYMPTOMS. FOLLOW UP IN 2 MOS.

## 2019-12-19 NOTE — Progress Notes (Signed)
Subjective:    Patient ID: Toni Miller, female    DOB: 07-31-1961, 59 y.o.   MRN: [id number]  [name], MD  HPI BMs: EVERY DAY. CAN HAVE TROUBLE WITH COMPLETE EVACUATION. LARGE STOOLS CAN CAUSE SML AMOUNT ON TISSUE WHEN SHE WIPES.  HAD TO HAVE ANUS CREATED. TAKING MIRALAX EVERY OTHER DAY. GETS CHOKED SOMETIMES. HEARTBURN:1-2X/WEEK. RARE RECTAL BLEEDING.  PT DENIES FEVER, CHILLS, HEMATEMESIS, nausea, vomiting, melena, diarrhea, CHEST PAIN, SHORTNESS OF BREATH, CHANGE IN BOWEL IN HABITS, constipation, abdominal pain, OR problems swallowing.  Past Medical History:  Diagnosis Date  . Anxiety   . Arthritis   . Cancer ([facility]) 1993   abnormal pap treated at [facility]  . Constipation   . Depression   . GERD (gastroesophageal reflux disease)   . Glaucoma 1993  . History of kidney stones   . Hot flashes, menopausal 06/12/2011  . Hyperlipidemia   . Hypertension   . Neck pain 7/09   WITH BULGING DISC-----RECIEVING EPIDURALS   . Sinusitis    Past Surgical History:  Procedure Laterality Date  . ABDOMINAL EXPLORATION SURGERY  age 51   bowel obstruction, [facility]  . ABDOMINAL HYSTERECTOMY  2007   [facility], [facility]  . BILATERAL EYE SURGERY     for glaucoma  . BIOPSY  01/09/2019   Procedure: BIOPSY;  Surgeon: Danie Binder, MD;  Location: [facility];  Service: Endoscopy;;  gstric   . BREAST BIOPSY Left    benign  . BREAST SURGERY Left 2004   left partial mastectomy, [facility]  . CATARACT EXTRACTION Right 06/04/2015  . CATARACT EXTRACTION W/PHACO  05/16/2011   Procedure: CATARACT EXTRACTION PHACO AND INTRAOCULAR LENS PLACEMENT (IOC);  Surgeon: Tonny Branch;  Location: [facility] ORS;  Service: Ophthalmology;  Laterality: Right;  . COLONOSCOPY N/A 05/17/2013   Dr. Barnie Alderman diverticulosis was noted/small internal hemorrhoids  . CYST REMOVED LEFT BREAST /BENIGN  2005   left, [facility]  . ESOPHAGOGASTRODUODENOSCOPY N/A 01/09/2019   Procedure: ESOPHAGOGASTRODUODENOSCOPY (EGD);  Surgeon: Danie Binder, MD;   Location: [facility];  Service: Endoscopy;  Laterality: N/A;  12:45pm  . EYE SURGERY  2010, 1992 approx   bilateral  . REPAIR IMPERFORATE ANUS / ANORECTOPLASTY     per patient  . TOTAL ABDOMINAL HYSTERECTOMY W/ BILATERAL SALPINGOOPHORECTOMY  2007   [facility], [place]  . TUBAL LIGATION  1991    Allergies  Allergen Reactions  . Acyclovir And Related Rash   Current Outpatient Medications  Medication Sig    . aspirin EC 81 MG tablet Take 81 mg by mouth daily.    . calcium-vitamin D (OSCAL-500) 500-400 MG-UNIT tablet Take 1 tablet by mouth 2 (two) times daily.    . cholecalciferol (VITAMIN D3) 25 MCG (1000 UT) tablet Take 1,000 Units by mouth daily.    Marland Kitchen conjugated estrogens (PREMARIN) vaginal cream Apply sparingly with fingertip three times weekly    . cycloSPORINE (RESTASIS) 0.05 % ophthalmic emulsion Place 1 drop into both eyes 2 (two) times daily.    . diclofenac (VOLTAREN) 75 MG EC tablet Take 1 tablet (75 mg total) by mouth 2 (two) times daily with a meal.    . esomeprazole (NEXIUM) 40 MG capsule 1 PO 30 MINS PRIOR TO BREAKFAST AND SUPPER)    . fluticasone (FLONASE) 50 MCG/ACT nasal spray Place 1 spray into both nostrils daily.    Marland Kitchen gabapentin (NEURONTIN) 300 MG capsule TAKE 1 CAPSULE BY MOUTH TWICE DAILY.    . hydrOXYzine (ATARAX/VISTARIL) 10 MG tablet Take  one tablet once daily as needed , for itching    . montelukast (SINGULAIR) 10 MG tablet TAKE 1 TABLET BY MOUTH ONCE DAILY.    . Multiple Vitamin (MULTIVITAMIN) capsule Take 1 capsule by mouth daily.    Marland Kitchen OVER THE COUNTER MEDICATION Place 1 application vaginally every other day. Replenish - Moisture    . polyethylene glycol powder (GLYCOLAX/MIRALAX) powder  MIX 1 CAPFUL (17G) IN 8 OUNCES OF JUICE/WATER AND DRINK ONCE DAILY.)    . rosuvastatin (CRESTOR) 10 MG tablet TAKE (1) TABLET BY MOUTH AT BEDTIME.    . tizanidine (ZANAFLEX) 2 MG capsule One capsule at bedtime as needed , for neck spasm (Patient taking differently: Take 2 mg by mouth  at bedtime as needed for muscle spasms. One capsule at bedtime as needed , for neck spasm)    . TRAVATAN Z 0.004 % SOLN ophthalmic solution Place 1 drop into both eyes at bedtime.    . triamterene-hydrochlorothiazide (MAXZIDE-25) 37.5-25 MG tablet Take one and a half tablets once daily for blood pressure by mouth    . venlafaxine XR (EFFEXOR-XR) 150 MG 24 hr capsule Take 1 capsule (150 mg total) by mouth daily with breakfast. Take one capsule daily     Review of Systems PER HPI OTHERWISE ALL SYSTEMS ARE NEGATIVE.    Objective:   Physical Exam Constitutional:      General: She is not in acute distress.    Appearance: Normal appearance.  HENT:     Mouth/Throat:     Comments: MASK IN PLACE Eyes:     General: No scleral icterus.    Pupils: Pupils are equal, round, and reactive to light.  Cardiovascular:     Rate and Rhythm: Normal rate and regular rhythm.     Pulses: Normal pulses.     Heart sounds: Normal heart sounds.  Pulmonary:     Effort: Pulmonary effort is normal.     Breath sounds: Normal breath sounds.  Abdominal:     General: Bowel sounds are normal.     Palpations: Abdomen is soft.     Tenderness: There is no abdominal tenderness.  Musculoskeletal:     Cervical back: Normal range of motion.     Right lower leg: No edema.     Left lower leg: No edema.  Lymphadenopathy:     Cervical: No cervical adenopathy.  Skin:    General: Skin is warm and dry.  Neurological:     Mental Status: She is alert and oriented to person, place, and time.     Comments: NO  NEW FOCAL DEFICITS  Psychiatric:        Mood and Affect: Mood normal.     Comments: NORMAL AFFECT       Assessment & Plan:

## 2019-12-19 NOTE — Patient Instructions (Addendum)
DO KEGEL EXERCISES 10 TIMES AN HOUR WHILE YOU'RE AWAKE TO STRENGTHEN YOUR PELVIC FLOOR.  INSTEAD OF CAFFEINE, DRINK FRUIT WATER.   ADD COLACE THREE TIMES A WEEK TO SOFTEN STOOL.   USE PREPARATION H AND WITCH HAZEL PADS FOUR TIMES  A DAY IF NEEDED TO RELIEVE RECTAL PAIN/PRESSURE/BLEEDING.   PRACTICE CHAIR YOGA FOR 15-30 MINS 3 OR 4 TIMES A WEEK. SEE HANDOUT.  FOLLOW UP IN 2 MOS.

## 2019-12-19 NOTE — Progress Notes (Signed)
CC'ED TO PCP 

## 2019-12-19 NOTE — Assessment & Plan Note (Addendum)
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 DIFFICULTY WITH COMPLETE EVACUATION.  DO KEGEL EXERCISES 10 TIMES AN HOUR WHILE YOU'RE AWAKE TO STRENGTHEN YOUR PELVIC FLOOR. INSTEAD OF CAFFEINE, DRINK FRUIT WATER. ADD COLACE THREE TIMES A WEEK TO SOFTEN STOOL.  USE PREPARATION H AND WITCH HAZEL PADS FOUR TIMES  A DAY IF NEEDED TO RELIEVE RECTAL PAIN/PRESSURE/BLEEDING. PRACTICE CHAIR YOGA FOR 15-30 MINS 3 OR 4 TIMES A WEEK. SEE HANDOUT.  FOLLOW UP IN  2 MOS.

## 2019-12-24 ENCOUNTER — Encounter: Payer: Medicare Other | Admitting: Family Medicine

## 2019-12-25 ENCOUNTER — Other Ambulatory Visit: Payer: Self-pay | Admitting: Family Medicine

## 2019-12-25 ENCOUNTER — Other Ambulatory Visit: Payer: Self-pay | Admitting: Gastroenterology

## 2020-01-10 ENCOUNTER — Encounter: Payer: Medicare Other | Admitting: Family Medicine

## 2020-01-11 ENCOUNTER — Other Ambulatory Visit: Payer: Self-pay | Admitting: Family Medicine

## 2020-01-14 ENCOUNTER — Encounter: Payer: Medicare Other | Admitting: Family Medicine

## 2020-01-20 ENCOUNTER — Other Ambulatory Visit: Payer: Self-pay

## 2020-01-20 ENCOUNTER — Ambulatory Visit (INDEPENDENT_AMBULATORY_CARE_PROVIDER_SITE_OTHER): Payer: Medicare Other | Admitting: Family Medicine

## 2020-01-20 ENCOUNTER — Encounter: Payer: Self-pay | Admitting: Family Medicine

## 2020-01-20 VITALS — BP 135/77 | HR 86 | Temp 97.8°F | Ht 62.0 in | Wt 127.6 lb

## 2020-01-20 DIAGNOSIS — L309 Dermatitis, unspecified: Secondary | ICD-10-CM | POA: Diagnosis not present

## 2020-01-20 MED ORDER — TRIAMCINOLONE ACETONIDE 0.1 % EX CREA: 1.0000 "application " | TOPICAL_CREAM | Freq: Two times a day (BID) | CUTANEOUS | 0 refills | Status: DC

## 2020-01-20 NOTE — Progress Notes (Signed)
Acute Office Visit  Subjective:    Patient ID: Toni Miller, female    DOB: [birth year], 59 y.o.   MRN: [id number]  Chief Complaint  Patient presents with  . Rash    rash on right hip for 3 weeks. Was given itch meds by Dr Moshe Cipro.    HPI Patient is in today for skin abnormality noted 3 weeks ago on right buttocks. Pt with psoriasis in the past on the face. Pt states bump at first noted on buttocks-she attempted to drain manually with no result.  Pt states she washes with dove soap.  Pt with no h/o shingles in area. Pt has experienced dermatitis in the past and dryness due to hysterectomy.    Past Medical History:  Diagnosis Date  . Anxiety   . Arthritis   . Cancer ([place]) 1993   abnormal pap treated at [facility]  . Constipation   . Depression   . GERD (gastroesophageal reflux disease)   . Glaucoma 1993  . History of kidney stones   . Hot flashes, menopausal 06/12/2011  . Hyperlipidemia   . Hypertension   . Neck pain 7/09   WITH BULGING DISC-----RECIEVING EPIDURALS   . Sinusitis     Past Surgical History:  Procedure Laterality Date  . ABDOMINAL EXPLORATION SURGERY  age 25   bowel obstruction, [facility]  . ABDOMINAL HYSTERECTOMY  2007   [facility], [facility]  . BILATERAL EYE SURGERY     for glaucoma  . BIOPSY  01/09/2019   Procedure: BIOPSY;  Surgeon: Danie Binder, MD;  Location[facility];  Service: Endoscopy;;  gstric   . BREAST BIOPSY Left    benign  . BREAST SURGERY Left 2004   left partial mastectomy, [facility]  . CATARACT EXTRACTION Right 06/04/2015  . CATARACT EXTRACTION W/PHACO  05/16/2011   Procedure: CATARACT EXTRACTION PHACO AND INTRAOCULAR LENS PLACEMENT (IOC);  Surgeon: Tonny Branch;  Location: [facility];  Service: Ophthalmology;  Laterality: Right;  . COLONOSCOPY N/A 05/17/2013   Dr. Barnie Alderman diverticulosis was noted/small internal hemorrhoids  . CYST REMOVED LEFT BREAST /BENIGN  2005   left, [facility]  . ESOPHAGOGASTRODUODENOSCOPY N/A 01/09/2019   Procedure:  ESOPHAGOGASTRODUODENOSCOPY (EGD);  Surgeon: Danie Binder, MD;  Location[facility];  Service: Endoscopy;  Laterality: N/A;  12:45pm  . EYE SURGERY  2010, 1992 approx   bilateral  . REPAIR IMPERFORATE ANUS / ANORECTOPLASTY     per patient  . TOTAL ABDOMINAL HYSTERECTOMY W/ BILATERAL SALPINGOOPHORECTOMY  2007   [facility], [facility]  . TUBAL LIGATION  1991    Family History  Adopted: Yes  Problem Relation Age of Onset  . Alcohol abuse Mother   . Stomach cancer Mother 87  . Lung cancer Father   . Glaucoma Father   . Alcohol abuse Father   . Seizures Father   . Breast cancer Sister 76  . Breast cancer Sister 81  . Heart disease Sister   . Heart attack Sister   . Glaucoma Son   . Glaucoma Maternal Grandfather   . Cancer Maternal Grandfather        poss leukemia  . SIDS Brother   . Hyperthyroidism Daughter   . Seizures Son        as a Sport and exercise psychologist  . Kidney disease Maternal Grandmother   . Heart disease Maternal Grandmother        Psychologist, forensic  . Leukemia Maternal Grandmother   . Colon cancer Neg Hx     Social History  Socioeconomic History  . Marital status: Significant Other    Spouse name: Not on file  . Number of children: 3  . Years of education: 80  . Highest education level: 12th grade  Occupational History  . Occupation: UNEMPLOYED 2011    Comment: disability    Employer: DISABLED  Tobacco Use  . Smoking status: Former Smoker    Packs/day: 3.00    Years: 2.00    Pack years: 6.00    Types: Cigarettes    Quit date: 05/12/1991    Years since quitting: 28.7  . Smokeless tobacco: Never Used  Substance and Sexual Activity  . Alcohol use: Yes    Comment: "sometimes"  . Drug use: Never  . Sexual activity: Yes    Birth control/protection: Surgical  Other Topics Concern  . Not on file  Social History Narrative   Lives 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   caffien- coffee, 1 cup daily   Social Determinants of Health   Financial Resource Strain:   . Difficulty of  Paying Living Expenses:   Food Insecurity:   . Worried About Charity fundraiser in the Last Year:   . Arboriculturist in the Last Year:   Transportation Needs:   . Film/video editor (Medical):   Marland Kitchen Lack of Transportation (Non-Medical):   Physical Activity:   . Days of Exercise per Week:   . Minutes of Exercise per Session:   Stress:   . Feeling of Stress :   Social Connections:   . Frequency of Communication with Friends and Family:   . Frequency of Social Gatherings with Friends and Family:   . Attends Religious Services:   . Active Member of Clubs or Organizations:   . Attends Archivist Meetings:   Marland Kitchen Marital Status:   Intimate Partner Violence:   . Fear of Current or Ex-Partner:   . Emotionally Abused:   Marland Kitchen Physically Abused:   . Sexually Abused:     Outpatient Medications Prior to Visit  Medication Sig Dispense Refill  . aspirin EC 81 MG tablet Take 81 mg by mouth daily.    . calcium-vitamin D (OSCAL-500) 500-400 MG-UNIT tablet Take 1 tablet by mouth 2 (two) times daily. 180 tablet 3  . cholecalciferol (VITAMIN D3) 25 MCG (1000 UT) tablet Take 1,000 Units by mouth daily.    Marland Kitchen conjugated estrogens (PREMARIN) vaginal cream Apply sparingly with fingertip three times weekly 42.5 g 2  . cycloSPORINE (RESTASIS) 0.05 % ophthalmic emulsion Place 1 drop into both eyes 2 (two) times daily.    . diclofenac (VOLTAREN) 75 MG EC tablet Take 1 tablet (75 mg total) by mouth 2 (two) times daily with a meal. 60 tablet 2  . esomeprazole (NEXIUM) 40 MG capsule TAKE ONE CAPSULE BY MOUTH TWICE DAILY. 60 capsule 5  . fluticasone (FLONASE) 50 MCG/ACT nasal spray Place 1 spray into both nostrils daily. 16 g 6  . gabapentin (NEURONTIN) 300 MG capsule TAKE 1 CAPSULE BY MOUTH TWICE DAILY. 60 capsule 0  . hydrOXYzine (ATARAX/VISTARIL) 10 MG tablet TAKE ONE TABLET BY MOUTH ONCE DAILY AS NEEDED FOR ITCHING. 30 tablet 3  . montelukast (SINGULAIR) 10 MG tablet TAKE 1 TABLET BY MOUTH ONCE  DAILY. 90 tablet 0  . Multiple Vitamin (MULTIVITAMIN) capsule Take 1 capsule by mouth daily.    Marland Kitchen OVER THE COUNTER MEDICATION Place 1 application vaginally every other day. Replenish - Moisture    . polyethylene glycol powder (GLYCOLAX/MIRALAX) powder  MIX 1 CAPFUL (17G) IN 8 OUNCES OF JUICE/WATER AND DRINK ONCE DAILY. (Patient taking differently: Take 17 g by mouth as needed for moderate constipation. MIX 1 CAPFUL (17G) IN 8 OUNCES OF JUICE/WATER AND DRINK ONCE DAILY.) 255 g 3  . rosuvastatin (CRESTOR) 10 MG tablet TAKE (1) TABLET BY MOUTH AT BEDTIME. 90 tablet 0  . tizanidine (ZANAFLEX) 2 MG capsule One capsule at bedtime as needed , for neck spasm (Patient taking differently: Take 2 mg by mouth at bedtime as needed for muscle spasms. One capsule at bedtime as needed , for neck spasm) 30 capsule 3  . TRAVATAN Z 0.004 % SOLN ophthalmic solution Place 1 drop into both eyes at bedtime.    . triamterene-hydrochlorothiazide (MAXZIDE-25) 37.5-25 MG tablet Take one and a half tablets once daily for blood pressure by mouth 135 tablet 3  . venlafaxine XR (EFFEXOR-XR) 150 MG 24 hr capsule Take 1 capsule (150 mg total) by mouth daily with breakfast. Take one capsule daily 90 capsule 1   No facility-administered medications prior to visit.    Allergies  Allergen Reactions  . Acyclovir And Related Rash    Review of Systems  Skin: Positive for rash. Negative for wound.       Objective:    Physical Exam Constitutional:      Appearance: Normal appearance.  Skin:    Findings: No rash.     Comments: Hyperpigmented plaques, no papules, no vesicles  Neurological:     Mental Status: She is alert.     BP 135/77 (BP Location: Left Arm, Patient Position: Sitting, Cuff Size: Normal)   Pulse 86   Temp 97.8 F (36.6 C) (Temporal)   Ht 5\' 2"  (1.575 m)   Wt 127 lb 9.6 oz (57.9 kg)   SpO2 98%   BMI 23.34 kg/m  Wt Readings from Last 3 Encounters:  01/20/20 127 lb 9.6 oz (57.9 kg)  12/19/19 124 lb  (56.2 kg)  10/09/19 127 lb (57.6 kg)     Lab Results  Component Value Date   TSH 3.05 10/08/2019   Lab Results  Component Value Date   WBC 6.1 04/23/2019   HGB 13.6 04/23/2019   HCT 39.4 04/23/2019   MCV 88.7 04/23/2019   PLT 268 04/23/2019   Lab Results  Component Value Date   NA 138 10/08/2019   K 4.0 10/08/2019   CO2 32 10/08/2019   GLUCOSE 88 10/08/2019   BUN 24 10/08/2019   CREATININE 1.04 10/08/2019   BILITOT 0.4 10/08/2019   ALKPHOS 70 07/05/2017   AST 24 10/08/2019   ALT 30 (H) 10/08/2019   PROT 7.5 10/08/2019   ALBUMIN 4.5 07/05/2017   CALCIUM 9.7 10/08/2019   Lab Results  Component Value Date   CHOL 184 10/08/2019   Lab Results  Component Value Date   HDL 59 10/08/2019   Lab Results  Component Value Date   LDLCALC 101 (H) 10/08/2019   Lab Results  Component Value Date   TRIG 143 10/08/2019   Lab Results  Component Value Date   CHOLHDL 3.1 10/08/2019      Assessment & Plan:  1. Dermatitis Triamcinolone cream-rx, avoid scratching itching Morry Veiga Hannah Beat, MD

## 2020-01-20 NOTE — Patient Instructions (Addendum)
Use triamcinolone cream

## 2020-02-27 ENCOUNTER — Telehealth: Payer: Self-pay

## 2020-02-27 ENCOUNTER — Encounter (HOSPITAL_COMMUNITY): Payer: Self-pay | Admitting: Psychiatry

## 2020-02-27 ENCOUNTER — Other Ambulatory Visit: Payer: Self-pay

## 2020-02-27 ENCOUNTER — Ambulatory Visit (INDEPENDENT_AMBULATORY_CARE_PROVIDER_SITE_OTHER): Payer: Medicare Other | Admitting: Gastroenterology

## 2020-02-27 ENCOUNTER — Telehealth (INDEPENDENT_AMBULATORY_CARE_PROVIDER_SITE_OTHER): Payer: Medicare Other | Admitting: Psychiatry

## 2020-02-27 ENCOUNTER — Encounter: Payer: Self-pay | Admitting: Gastroenterology

## 2020-02-27 DIAGNOSIS — F3342 Major depressive disorder, recurrent, in full remission: Secondary | ICD-10-CM

## 2020-02-27 DIAGNOSIS — F419 Anxiety disorder, unspecified: Secondary | ICD-10-CM | POA: Insufficient documentation

## 2020-02-27 DIAGNOSIS — K648 Other hemorrhoids: Secondary | ICD-10-CM | POA: Diagnosis not present

## 2020-02-27 DIAGNOSIS — K219 Gastro-esophageal reflux disease without esophagitis: Secondary | ICD-10-CM | POA: Diagnosis not present

## 2020-02-27 DIAGNOSIS — K5909 Other constipation: Secondary | ICD-10-CM | POA: Diagnosis not present

## 2020-02-27 MED ORDER — VENLAFAXINE HCL ER 75 MG PO CP24: 75.0000 mg | ORAL_CAPSULE | Freq: Every day | ORAL | 0 refills | Status: DC

## 2020-02-27 NOTE — Patient Instructions (Signed)
1. Decrease venlafaxine 75 mg daily 2.Next appointment : 7/21 at 11 AM

## 2020-02-27 NOTE — Assessment & Plan Note (Signed)
SYMPTOMS FAIRLY WELL CONTROLLED.  CONTINUE NEXIUM. TAKE 30 MINUTES BEFORE MEALS TWICE DAILY. FOLLOW UP IN 6 MOS.

## 2020-02-27 NOTE — Telephone Encounter (Signed)
Please send refill for gabapentin to pharmacy

## 2020-02-27 NOTE — Patient Instructions (Addendum)
CONTINUE FISH OIL.  USE PREPARATION H FOUR TIMES  A DAY IF NEEDED TO RELIEVE RECTAL PAIN/PRESSURE/BLEEDING.   EAT TO LIVE AND THINK OF FOOD AS MEDICINE. 75% OF YOUR PLATE SHOULD BE FRUITS/VEGGIES.  To have more energy AND LESS ANXIETY:      1. If you must eat bread, EAT EZEKIEL BREAD. IT IS IN THE FROZEN SECTION OF THE GROCERY STORE.    2. DRINK WATER WITH FRUIT OR CUCUMBER ADDED. YOUR URINE SHOULD BE LIGHT YELLOW. AVOID SODA, GATORADE, ENERGY DRINKS, OR DIET SODA.     3. AVOID HIGH FRUCTOSE CORN SYRUP AND CAFFEINE.     4 DO NOT chew SUGAR FREE GUM OR USE ARTIFICIAL SWEETENERS. IF NEEDED USE STEVIA AS A SWEETENER.    5. DO NOT EAT ENRICHED WHEAT FLOUR, PASTA, RICE, OR CEREAL.    6. ONLY EAT WILD CAUGHT SEAFOOD, GRASS FED BEEF OR CHICKEN, PORK FROM PASTURE RAISE PIGS, OR EGGS FROM PASTURE RAISED CHICKENS.    7. PRACTICE CHAIR YOGA FOR 15-30 MINS 3 OR 4 TIMES A WEEK AND PROGRESS TO HATHA YOGA OVER NEXT 6 MOS.    8. START TAKING A VITAMIN B12 DAILY.   9. INCREASE VITAMIN D3 2000-5000 IU DAILY.    10.  ALPHA LIPOIC ACID 500 MG TWICE DAILY WITH MEALS.   **NATURAL ANTI-INFLAMMATORY SUPPLEMENT THAT IS AN ALTERNATIVE TO IBUPROFEN OR NAPROXEN**   USE MILK OF MAGNESIA PILLS OR LIQUID OR MAGNESIUM CITRATE PILLS DAILY TO REDUCE CONSTIPATION.   FOLLOW UP IN 6 MOS WITH ANNA BOONE.

## 2020-02-27 NOTE — Telephone Encounter (Signed)
LVM for pt to call the office.

## 2020-02-27 NOTE — Assessment & Plan Note (Signed)
SYMPTOMS FAIRLY WELL CONTROLLED.   EAT TO LIVE AND THINK OF FOOD AS MEDICINE. 75% OF YOUR PLATE SHOULD BE FRUITS/VEGGIES.  To have more energy AND LESS ANXIETY:      1. If you must eat bread, EAT EZEKIEL BREAD. IT IS IN THE FROZEN SECTION OF THE GROCERY STORE.   2. DRINK WATER WITH FRUIT OR CUCUMBER ADDED. YOUR URINE SHOULD BE LIGHT YELLOW. AVOID SODA, GATORADE, ENERGY DRINKS, OR DIET SODA.    3. AVOID HIGH FRUCTOSE CORN SYRUP AND CAFFEINE.    4 DO NOT chew SUGAR FREE GUM OR USE ARTIFICIAL SWEETENERS. IF NEEDED USE STEVIA AS A SWEETENER.   5. DO NOT EAT ENRICHED WHEAT FLOUR, PASTA, RICE, OR CEREAL.   6. ONLY EAT WILD CAUGHT SEAFOOD, GRASS FED BEEF OR CHICKEN, PORK FROM PASTURE RAISE PIGS, OR EGGS FROM PASTURE RAISED CHICKENS.   7. PRACTICE CHAIR YOGA FOR 15-30 MINS 3 OR 4 TIMES A WEEK AND PROGRESS TO HATHA YOGA OVER NEXT 6 MOS.   8. START TAKING A VITAMIN B12 DAILY.  9. INCREASE VITAMIN D3 2000-5000 IU DAILY.  10.  ALPHA LIPOIC ACID 500 MG TWICE DAILY WITH MEALS.   **NATURAL ANTI-INFLAMMATORY SUPPLEMENT THAT IS AN ALTERNATIVE TO IBUPROFEN OR NAPROXEN**  FOLLOW UP IN 6 MOS WITH ANNA BOONE.

## 2020-02-27 NOTE — Assessment & Plan Note (Signed)
INTERMITTENT: 1XQ6 MOS. LAST TSC IN 2014.  USE PREPARATION H FOUR TIMES  A DAY IF NEEDED TO RELIEVE RECTAL PAIN/PRESSURE/BLEEDING. FOLLOW UP IN 6 MOS.

## 2020-02-27 NOTE — Assessment & Plan Note (Signed)
OCCASIONAL BRBPR AND SYMPTOMS NOT IDEALLY CONTROLLED ON COLACE.  CONTINUE FISH OIL. USE MILK OF MAGNESIA PILLS OR LIQUID OR MAGNESIUM CITRATE PILLS DAILY TO REDUCE CONSTIPATION. FOLLOW UP IN 6 MOS WITH ANNA BOONE.

## 2020-02-27 NOTE — Telephone Encounter (Signed)
Discontinued by Jarrett Soho in Feb , 2021, pls let me know why she is requesting the gabapentin

## 2020-02-27 NOTE — Progress Notes (Signed)
Subjective:    Patient ID: Toni Miller, female    DOB: September 28, 1961, 59 y.o.   MRN: [id number]   Toni Helper, MD  HPI Having trouble passing stool. Needs to take colace to have a complete bowel movement. THINKS VITAMIN D IS CAUSING HER TO HAVE TROUBLE PASSING STOOL. MAY SEE A LITTLE BLOOD WHEN DIFFICULTY PASSING STOOL. BEING EVALUATED FOR SWOLLEN LESIONS UNDER HER TONGUE. WANTS TO HAVE THEM REMOVED. ATE A SALAD AND CAUSED HER STOMACH TO RUMBLE.  PT DENIES FEVER, CHILLS, HEMATEMESIS, nausea, vomiting, melena, diarrhea, CHEST PAIN, SHORTNESS OF BREATH, CHANGE IN BOWEL IN HABITS,abdominal pain, problems swallowing, OR heartburn or indigestion.  Past Medical History:  Diagnosis Date  . Anxiety   . Arthritis   . Cancer ([place]) 1993   abnormal pap [facility] at [facility]  . Constipation   . Depression   . GERD (gastroesophageal reflux disease)   . Glaucoma 1993  . History of kidney stones   . Hot flashes, menopausal 06/12/2011  . Hyperlipidemia   . Hypertension   . Neck pain 7/09   WITH BULGING DISC-----RECIEVING EPIDURALS   . Sinusitis    Past Surgical History:  Procedure Laterality Date  . ABDOMINAL EXPLORATION SURGERY  age 81   bowel obstruction, [facility]  . ABDOMINAL HYSTERECTOMY  2007   [facility], [facility]  . BILATERAL EYE SURGERY     for glaucoma  . BIOPSY  01/09/2019   Procedure: BIOPSY;  Surgeon: Danie Binder, MD;  [facility];  Service: Endoscopy;;  gstric   . BREAST BIOPSY Left    benign  . BREAST SURGERY Left 2004   left partial mastectomy, [facility]  . CATARACT EXTRACTION Right 06/04/2015  . CATARACT EXTRACTION W/PHACO  05/16/2011   Procedure: CATARACT EXTRACTION PHACO AND INTRAOCULAR LENS PLACEMENT (IOC);  Surgeon: Tonny Branch;  Location: [facility];  Service: Ophthalmology;  Laterality: Right;  . COLONOSCOPY N/A 05/17/2013   Dr. Barnie Alderman diverticulosis was noted/small internal hemorrhoids  . CYST REMOVED LEFT BREAST /BENIGN  2005   left, [facility]  .  ESOPHAGOGASTRODUODENOSCOPY N/A 01/09/2019   Procedure: ESOPHAGOGASTRODUODENOSCOPY (EGD);  Surgeon: Danie Binder, MD;  [facility];  Service: Endoscopy;  Laterality: N/A;  12:45pm  . EYE SURGERY  2010, 1992 approx   bilateral  . REPAIR IMPERFORATE ANUS / ANORECTOPLASTY     per patient  . TOTAL ABDOMINAL HYSTERECTOMY W/ BILATERAL SALPINGOOPHORECTOMY  2007   [facility], [facility]  . TUBAL LIGATION  1991    Allergies  Allergen Reactions  . Acyclovir And Related Rash    Current Outpatient Medications  Medication Sig    . aspirin EC 81 MG tablet Take 81 mg by mouth daily.    . calcium-vitamin D (OSCAL-500) 500-400 MG-UNIT tablet Take 1 tablet by mouth 2 (two) times daily. (Patient taking differently: Take 1 tablet by mouth daily. )    . conjugated estrogens (PREMARIN) vaginal cream Apply sparingly with fingertip three times weekly    . cycloSPORINE (RESTASIS) 0.05 % ophthalmic emulsion Place 1 drop into both eyes 2 (two) times daily.    Marland Kitchen docusate sodium (COLACE) 100 MG capsule Take 100 mg by mouth daily.    Marland Kitchen esomeprazole (NEXIUM) 40 MG capsule TAKE ONE CAPSULE BY MOUTH TWICE DAILY.    Marland Kitchen gabapentin (NEURONTIN) 300 MG capsule TAKE 1 CAPSULE BY MOUTH TWICE DAILY.    . hydrOXYzine (ATARAX/VISTARIL) 10 MG tablet TAKE ONE TABLET BY MOUTH ONCE DAILY AS NEEDED FOR ITCHING.    . montelukast (  SINGULAIR) 10 MG tablet TAKE 1 TABLET BY MOUTH ONCE DAILY.    . Multiple Vitamin (MULTIVITAMIN) capsule Take 1 capsule by mouth daily.    Marland Kitchen OVER THE COUNTER MEDICATION Place 1 application vaginally every other day. Replenish - Moisture    . polyethylene glycol powder (GLYCOLAX/MIRALAX) powder MIX 1 CAPFUL (17G) IN 8 OUNCES OF JUICE/WATER AND DRINK ONCE DAILY.)    . rosuvastatin (CRESTOR) 10 MG tablet TAKE (1) TABLET BY MOUTH AT BEDTIME.    . simethicone (MYLICON) 80 MG chewable tablet Chew 80 mg by mouth every 6 (six) hours as needed for flatulence.    . tizanidine (ZANAFLEX) 2 MG capsule One capsule at  bedtime as needed , for neck spasm (Patient taking differently: Take 2 mg by mouth at bedtime as needed for muscle spasms. One capsule at bedtime as needed , for neck spasm)    . TRAVATAN Z 0.004 % SOLN ophthalmic solution Place 1 drop into both eyes at bedtime.    . triamcinolone cream (KENALOG) 0.1 % Apply 1 application topically 2 (two) times daily.    Marland Kitchen triamterene-hydrochlorothiazide (MAXZIDE-25) 37.5-25 MG tablet Take one and a half tablets once daily for blood pressure by mouth    . venlafaxine XR (EFFEXOR-XR) 150 MG 24 hr capsule Take 1 capsule (150 mg total) by mouth daily with breakfast. Take one capsule daily    . cholecalciferol (VITAMIN D3) 25 MCG (1000 UT) tablet Take 1,000 Units by mouth daily.    .      .       Review of Systems PER HPI OTHERWISE ALL SYSTEMS ARE NEGATIVE.    Objective:   Physical Exam Constitutional:      General: She is not in acute distress.    Appearance: Normal appearance.  HENT:     Mouth/Throat:     Comments: MASK IN PLACE Eyes:     General: No scleral icterus.    Pupils: Pupils are equal, round, and reactive to light.  Cardiovascular:     Rate and Rhythm: Normal rate and regular rhythm.     Pulses: Normal pulses.     Heart sounds: Normal heart sounds.  Pulmonary:     Effort: Pulmonary effort is normal.     Breath sounds: Normal breath sounds.  Abdominal:     General: Bowel sounds are normal.     Palpations: Abdomen is soft.     Tenderness: There is no abdominal tenderness.  Musculoskeletal:     Cervical back: Normal range of motion.     Right lower leg: No edema.     Left lower leg: No edema.  Lymphadenopathy:     Cervical: No cervical adenopathy.  Skin:    General: Skin is warm and dry.  Neurological:     Mental Status: She is alert and oriented to person, place, and time.     Comments: NO  NEW FOCAL DEFICITS  Psychiatric:        Mood and Affect: Mood normal.     Comments: NORMAL AFFECT       Assessment & Plan:

## 2020-02-27 NOTE — Progress Notes (Signed)
Cc'ed to pcp °

## 2020-02-27 NOTE — Telephone Encounter (Signed)
Pt is wanting a refill on gabapentin however it looks as if this was discontinued. Would you like to refill?
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
## 2020-03-02 ENCOUNTER — Telehealth: Payer: Self-pay

## 2020-03-02 ENCOUNTER — Other Ambulatory Visit: Payer: Self-pay

## 2020-03-02 MED ORDER — GABAPENTIN 300 MG PO CAPS: 300.0000 mg | ORAL_CAPSULE | Freq: Two times a day (BID) | ORAL | 5 refills | Status: DC

## 2020-03-02 NOTE — Telephone Encounter (Signed)
Please send in gabapentin (NEURONTIN) 300 MG capsule to [facility]

## 2020-03-02 NOTE — Telephone Encounter (Signed)
Per Prior Note== [name], MD  Shelda Altes, CMA 4 days ago     Discontinued by Jarrett Soho in Feb , 2021, pls let me know why she is requesting the gabapentin

## 2020-03-19 ENCOUNTER — Other Ambulatory Visit: Payer: Self-pay

## 2020-03-19 ENCOUNTER — Ambulatory Visit (INDEPENDENT_AMBULATORY_CARE_PROVIDER_SITE_OTHER): Payer: Medicare Other | Admitting: Family Medicine

## 2020-03-19 ENCOUNTER — Telehealth: Payer: Self-pay

## 2020-03-19 ENCOUNTER — Other Ambulatory Visit: Payer: Self-pay | Admitting: *Deleted

## 2020-03-19 ENCOUNTER — Encounter: Payer: Self-pay | Admitting: Family Medicine

## 2020-03-19 VITALS — BP 139/95 | HR 73 | Temp 97.0°F | Ht 62.0 in | Wt 126.8 lb

## 2020-03-19 DIAGNOSIS — D37032 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: Secondary | ICD-10-CM

## 2020-03-19 DIAGNOSIS — L600 Ingrowing nail: Secondary | ICD-10-CM | POA: Diagnosis not present

## 2020-03-19 MED ORDER — FLUTICASONE PROPIONATE 50 MCG/ACT NA SUSP: 1.0000 | Freq: Every day | NASAL | 6 refills | Status: DC

## 2020-03-19 NOTE — Progress Notes (Signed)
Subjective:  Patient ID: Toni Miller, female    DOB: 1960/11/08  Age: 59 y.o. MRN: [id number]  CC:  Chief Complaint  Patient presents with  . Leg Pain    pt has a knot on right leg x 1 week ago lower right leg feels like its pinching for about 10 mins and comes and goes worst pain is at 8 when its in pain  . Nail Problem    both feet feels like ingrown toenail on both feet it is the big toe on both feet does hurt all the time when she is walking on them 9 on pain scale. Needs referral to podiatry.       HPI  HPI   Toni Miller is a 59 year old female patient Dr. Griffin Dakin.  Who presents today with ingrown toenails.  And leg discomfort.  Leg pain: She reports that she has a knot on her right leg this been going on for about a week, it is on the lower leg it feels like a pinching and its sensitivity for about 10 minutes and comes and goes with his pain being at an 8 when it is the worst.  She denies having any trauma or injury to the site.  She does not know exactly what caused it she thought it might have been a bruise but wanted to make sure that it got checked out.  Nail problem: Both great toes feel like they have ingrown toenails, particularly on the left side.  She reports that she had her son cut her nails for antimitochondrial to sure and therefore they grew back that way.  She has had ingrown toenails in the past she says.  And she has been seen by podiatry for that.  She reports when she walks on limb her pain can go up to a 9 out of 10.  She denies having any other injury or cause of the ingrown toenails.  Went to the dentist and was noted to have growths of submandibular glands.  She reports that they are not very uncomfortable for her.  But that she is having to be referred to have removal of them.  Today patient denies signs and symptoms of COVID 19 infection including fever, chills, cough, shortness of breath, and headache. Past Medical, Surgical, Social History,  Allergies, and Medications have been Reviewed.   Past Medical History:  Diagnosis Date  . Anxiety   . Arthritis   . Cancer ([place]) 1993   abnormal pap [facility] at [facility]  . Constipation   . Depression   . GERD (gastroesophageal reflux disease)   . Glaucoma 1993  . History of kidney stones   . Hot flashes, menopausal 06/12/2011  . Hyperlipidemia   . Hypertension   . Neck pain 7/09   WITH BULGING DISC-----RECIEVING EPIDURALS   . Sinusitis     Current Meds  Medication Sig  . aspirin EC 81 MG tablet Take 81 mg by mouth daily.  . calcium-vitamin D (OSCAL-500) 500-400 MG-UNIT tablet Take 1 tablet by mouth 2 (two) times daily. (Patient taking differently: Take 1 tablet by mouth daily. )  . cholecalciferol (VITAMIN D3) 25 MCG (1000 UT) tablet Take 1,000 Units by mouth daily.  Marland Kitchen conjugated estrogens (PREMARIN) vaginal cream Apply sparingly with fingertip three times weekly  . cycloSPORINE (RESTASIS) 0.05 % ophthalmic emulsion Place 1 drop into both eyes 2 (two) times daily.  . diclofenac (VOLTAREN) 75 MG EC tablet Take 1 tablet (75 mg total) by mouth  2 (two) times daily with a meal.  . docusate sodium (COLACE) 100 MG capsule Take 100 mg by mouth daily.  Marland Kitchen esomeprazole (NEXIUM) 40 MG capsule TAKE ONE CAPSULE BY MOUTH TWICE DAILY.  Marland Kitchen gabapentin (NEURONTIN) 300 MG capsule Take 1 capsule (300 mg total) by mouth 2 (two) times daily.  . hydrOXYzine (ATARAX/VISTARIL) 10 MG tablet TAKE ONE TABLET BY MOUTH ONCE DAILY AS NEEDED FOR ITCHING.  . montelukast (SINGULAIR) 10 MG tablet TAKE 1 TABLET BY MOUTH ONCE DAILY.  . Multiple Vitamin (MULTIVITAMIN) capsule Take 1 capsule by mouth daily.  Marland Kitchen OVER THE COUNTER MEDICATION Place 1 application vaginally every other day. Replenish - Moisture  . polyethylene glycol powder (GLYCOLAX/MIRALAX) powder MIX 1 CAPFUL (17G) IN 8 OUNCES OF JUICE/WATER AND DRINK ONCE DAILY. (Patient taking differently: Take 17 g by mouth as needed for moderate constipation. MIX 1 CAPFUL  (17G) IN 8 OUNCES OF JUICE/WATER AND DRINK ONCE DAILY.)  . rosuvastatin (CRESTOR) 10 MG tablet TAKE (1) TABLET BY MOUTH AT BEDTIME.  . simethicone (MYLICON) 80 MG chewable tablet Chew 80 mg by mouth every 6 (six) hours as needed for flatulence.  . tizanidine (ZANAFLEX) 2 MG capsule One capsule at bedtime as needed , for neck spasm (Patient taking differently: Take 2 mg by mouth at bedtime as needed for muscle spasms. One capsule at bedtime as needed , for neck spasm)  . TRAVATAN Z 0.004 % SOLN ophthalmic solution Place 1 drop into both eyes at bedtime.  . triamcinolone cream (KENALOG) 0.1 % Apply 1 application topically 2 (two) times daily.  Marland Kitchen triamterene-hydrochlorothiazide (MAXZIDE-25) 37.5-25 MG tablet Take one and a half tablets once daily for blood pressure by mouth  . venlafaxine XR (EFFEXOR-XR) 75 MG 24 hr capsule Take 1 capsule (75 mg total) by mouth daily with breakfast. Take one capsule daily  . [DISCONTINUED] fluticasone (FLONASE) 50 MCG/ACT nasal spray Place 1 spray into both nostrils daily.    ROS:  Review of Systems  HENT: Negative.   Eyes: Negative.   Respiratory: Negative.   Cardiovascular: Negative.   Gastrointestinal: Negative.   Genitourinary: Negative.   Musculoskeletal: Negative.        Leg pain  Skin: Negative.        Toenail pain  Neurological: Negative.   Endo/Heme/Allergies: Negative.   Psychiatric/Behavioral: Negative.   All other systems reviewed and are negative.    Objective:   Today's Vitals: BP (!) 139/95 (BP Location: Right Arm, Patient Position: Sitting, Cuff Size: Normal)   Pulse 73   Temp (!) 97 F (36.1 C) (Temporal)   Ht 5\' 2"  (1.575 m)   Wt 126 lb 12.8 oz (57.5 kg)   SpO2 99%   BMI 23.19 kg/m  Vitals with BMI 03/19/2020 02/27/2020 01/20/2020  Height 5\' 2"  5\' 2"  5\' 2"   Weight 126 lbs 13 oz 125 lbs 6 oz 127 lbs 10 oz  BMI 23.19 [id number]  Systolic [id number]  Diastolic 95 88 77  Pulse 73 87 86  Some encounter information is  confidential and restricted. Go to Review Flowsheets activity to see all data.     Physical Exam Vitals and nursing note reviewed.  Constitutional:      Appearance: Normal appearance. She is obese.  HENT:     Head: Normocephalic and atraumatic.     Right Ear: External ear normal.     Left Ear: External ear normal.     Nose: Nose normal.     Mouth/Throat:  Pharynx: Oropharynx is clear.     Comments: Noted swelling under the tongue bilateral submandibular glands, pink bumpy growths Eyes:     General:        Right eye: No discharge.        Left eye: No discharge.     Conjunctiva/sclera: Conjunctivae normal.  Cardiovascular:     Rate and Rhythm: Normal rate and regular rhythm.     Pulses: Normal pulses.     Heart sounds: Normal heart sounds.  Pulmonary:     Effort: Pulmonary effort is normal.     Breath sounds: Normal breath sounds.  Musculoskeletal:     Cervical back: Normal range of motion and neck supple.  Feet:     Right foot:     Skin integrity: Skin integrity normal.     Toenail Condition: Right toenails are ingrown.     Left foot:     Skin integrity: Skin integrity normal.     Toenail Condition: Left toenails are ingrown.  Skin:    General: Skin is warm.       Neurological:     General: No focal deficit present.     Mental Status: She is alert and oriented to person, place, and time.  Psychiatric:        Mood and Affect: Mood normal.        Behavior: Behavior normal.        Thought Content: Thought content normal.        Judgment: Judgment normal.     Assessment   1. Ingrown toenail of both feet   2. 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 Ambulatory referral to Podiatry     Plan: Please see assessment and plan per problem list above.   No orders of the defined types were placed in this encounter.   Patient to follow-up in as scheduled  Perlie Mayo, NP

## 2020-03-19 NOTE — Patient Instructions (Addendum)
I appreciate the opportunity to provide you with care for your health and wellness. Today we discussed: ingrown toenails and leg pain  Follow up: 09/02/2020 as scheduled   No labs today Referrals today to foot dr   Leg area looks like bruise should start to go away in the next 2-3 weeks.  I wish you well on your upcoming mouth surgery-please let me know if you need clearance for this.  Please continue to practice social distancing to keep you, your family, and our community safe.  If you must go out, please wear a mask and practice good handwashing.  It was a pleasure to see you and I look forward to continuing to work together on your health and well-being. Please do not hesitate to call the office if you need care or have questions about your care.  Have a wonderful day and week. With Gratitude, Cherly Beach, DNP, AGNP-BC

## 2020-03-19 NOTE — Telephone Encounter (Signed)
Medication sent to [facility]

## 2020-03-19 NOTE — Telephone Encounter (Signed)
Pt requesting call in refill for Flonase

## 2020-03-24 DIAGNOSIS — D37032 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: Secondary | ICD-10-CM | POA: Insufficient documentation

## 2020-03-24 NOTE — Assessment & Plan Note (Signed)
She is being followed by dentistry for this.  And referred for surgery

## 2020-03-24 NOTE — Assessment & Plan Note (Signed)
Referral to podiatry for bilateral ingrown toenails.

## 2020-03-26 ENCOUNTER — Encounter: Payer: Self-pay | Admitting: Podiatry

## 2020-03-26 ENCOUNTER — Ambulatory Visit (INDEPENDENT_AMBULATORY_CARE_PROVIDER_SITE_OTHER): Payer: Medicare Other | Admitting: Podiatry

## 2020-03-26 ENCOUNTER — Other Ambulatory Visit: Payer: Self-pay

## 2020-03-26 VITALS — HR 97

## 2020-03-26 DIAGNOSIS — L600 Ingrowing nail: Secondary | ICD-10-CM

## 2020-03-26 DIAGNOSIS — M2041 Other hammer toe(s) (acquired), right foot: Secondary | ICD-10-CM

## 2020-03-26 MED ORDER — NEOMYCIN-POLYMYXIN-HC 3.5-10000-1 OT SOLN: OTIC | 1 refills | Status: DC

## 2020-03-26 NOTE — Patient Instructions (Signed)

## 2020-03-30 NOTE — Progress Notes (Signed)
Subjective:   Patient ID: Toni Miller, female   DOB: 59 y.o.   MRN: [id number]   HPI Patient presents with chronic ingrown toenails of both big toes and states they get sore and make shoe gear difficult.  Patient states this is been ongoing and more difficult for her to wear shoe gear with comfortably.  Patient does not smoke likes to be active   Review of Systems  All other systems reviewed and are negative.       Objective:  Physical Exam Vitals and nursing note reviewed.  Constitutional:      Appearance: She is well-developed.  Pulmonary:     Effort: Pulmonary effort is normal.  Musculoskeletal:        General: Normal range of motion.  Skin:    General: Skin is warm.  Neurological:     Mental Status: She is alert.     Neurovascular status found to be intact muscle strength was found to be adequate range of motion within normal limits.  Patient is noted to have ingrown incurvated nail bed right hallux lateral border left hallux medial border that are painful when pressed make shoe gear difficult.  Patient has tried trimming and soaks without relief of symptoms and has no redness drainage noted currently     Assessment:  Chronic ingrown toenail deformity hallux bilateral with pain     Plan:  H&P conditions reviewed recommended correction of nail borders.  Explained procedure risk patient wants surgery and today it infiltrated each hallux 60 mg like Marcaine mixture remove the right hallux lateral border left hallux medial border exposed matrix applied phenol 3 applications 30 seconds followed by alcohol lavage sterile dressing.  Gave instructions on soaks and padding for the area and reappoint and encouraged to call with questions concerns and leave dressing on 24 hours but take it off early if any throbbing were to occur

## 2020-04-01 DIAGNOSIS — H5213 Myopia, bilateral: Secondary | ICD-10-CM | POA: Diagnosis not present

## 2020-04-15 ENCOUNTER — Telehealth: Payer: Self-pay

## 2020-04-15 ENCOUNTER — Other Ambulatory Visit: Payer: Self-pay | Admitting: *Deleted

## 2020-04-15 ENCOUNTER — Other Ambulatory Visit: Payer: Self-pay

## 2020-04-15 MED ORDER — MONTELUKAST SODIUM 10 MG PO TABS: 10.0000 mg | ORAL_TABLET | Freq: Every day | ORAL | 1 refills | Status: DC

## 2020-04-15 NOTE — Telephone Encounter (Signed)
Singulair refill sent in for pt

## 2020-04-15 NOTE — Telephone Encounter (Signed)
Pt is calling in to get a refill on Singular
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
## 2020-05-25 DIAGNOSIS — H524 Presbyopia: Secondary | ICD-10-CM | POA: Diagnosis not present

## 2020-05-25 DIAGNOSIS — H52223 Regular astigmatism, bilateral: Secondary | ICD-10-CM | POA: Diagnosis not present

## 2020-05-27 ENCOUNTER — Telehealth (INDEPENDENT_AMBULATORY_CARE_PROVIDER_SITE_OTHER): Payer: Medicare Other | Admitting: Psychiatry

## 2020-05-27 ENCOUNTER — Other Ambulatory Visit: Payer: Self-pay

## 2020-05-27 ENCOUNTER — Encounter (HOSPITAL_COMMUNITY): Payer: Self-pay | Admitting: Psychiatry

## 2020-05-27 DIAGNOSIS — F3342 Major depressive disorder, recurrent, in full remission: Secondary | ICD-10-CM | POA: Diagnosis not present

## 2020-05-27 MED ORDER — VENLAFAXINE HCL ER 37.5 MG PO CP24: 37.5000 mg | ORAL_CAPSULE | Freq: Every day | ORAL | 0 refills | Status: DC

## 2020-06-03 ENCOUNTER — Other Ambulatory Visit (HOSPITAL_COMMUNITY): Payer: Self-pay | Admitting: Family Medicine

## 2020-06-03 DIAGNOSIS — Z1231 Encounter for screening mammogram for malignant neoplasm of breast: Secondary | ICD-10-CM

## 2020-06-17 ENCOUNTER — Other Ambulatory Visit: Payer: Self-pay

## 2020-06-17 ENCOUNTER — Other Ambulatory Visit: Payer: Self-pay | Admitting: Podiatry

## 2020-06-17 NOTE — Telephone Encounter (Signed)
Pt requesting refill on inhaler.

## 2020-06-17 NOTE — Telephone Encounter (Signed)
Asking for albuterol inhaler that she uses as needed. I don't see on her med list

## 2020-06-18 NOTE — Telephone Encounter (Signed)
Has not had this in over 5 years, needs phone visit to determine need

## 2020-06-19 ENCOUNTER — Telehealth (INDEPENDENT_AMBULATORY_CARE_PROVIDER_SITE_OTHER): Payer: Medicare Other | Admitting: Family Medicine

## 2020-06-19 ENCOUNTER — Encounter: Payer: Self-pay | Admitting: Family Medicine

## 2020-06-19 ENCOUNTER — Other Ambulatory Visit: Payer: Self-pay

## 2020-06-19 VITALS — BP 139/95 | Ht 62.0 in | Wt 126.0 lb

## 2020-06-19 DIAGNOSIS — J4520 Mild intermittent asthma, uncomplicated: Secondary | ICD-10-CM | POA: Diagnosis not present

## 2020-06-19 MED ORDER — ALBUTEROL SULFATE HFA 108 (90 BASE) MCG/ACT IN AERS: 1.0000 | INHALATION_SPRAY | Freq: Four times a day (QID) | RESPIRATORY_TRACT | 1 refills | Status: DC | PRN

## 2020-06-19 NOTE — Assessment & Plan Note (Signed)
Takes Singulair regularly.  Needs refill on inhaler as she is can have some upcoming dental work and needs to have inhaler present.

## 2020-06-19 NOTE — Telephone Encounter (Signed)
Pt made a phone visit with Jarrett Soho this am due to wanting an inhaler refill

## 2020-06-19 NOTE — Telephone Encounter (Signed)
LVM letting pt know to call the office to set up a phone visit

## 2020-06-19 NOTE — Progress Notes (Signed)
Virtual Visit via Telephone Note   This visit type was conducted due to national recommendations for restrictions regarding the COVID-19 Pandemic (e.g. social distancing) in an effort to limit this patient's exposure and mitigate transmission in our community.  Due to her co-morbid illnesses, this patient is at least at moderate risk for complications without adequate follow up.  This format is felt to be most appropriate for this patient at this time.  The patient did not have access to video technology/had technical difficulties with video requiring transitioning to audio format only (telephone).  All issues noted in this document were discussed and addressed.  No physical exam could be performed with this format.    Evaluation Performed:  Follow-up visit  Date:  06/19/2020   ID:  ANALISA SLEDD, DOB 04-07-1961, MRN [id number]  Patient Location: Home Provider Location: Office/Clinic  Location of Patient: Home Location of Provider: Telehealth Consent was obtain for visit to be over via telehealth. I verified that I am speaking with the correct person using two identifiers.  PCP:  [name], MD   Chief Complaint: Upcoming dental work  History of Present Illness:    ALEJANDRA HUNT is a 59 y.o. female with history of asthma, hyperlipidemia, hypertension, allergies, anxiety, constipation among others.  She presents today for phone visit secondary to having some upcoming dental work done.  Her dentist told her to bring inhaler however she does not have any inhalers as she has had to need one in some time.  But due to the type of procedure she has she is required to have an inhaler and so she went to see if she can get a refill.  She denies having any cough fevers chills shortness of breath trouble breathing trouble with sinuses or allergies.  Denies having any chest pain headaches or dizziness.  The patient does not have symptoms concerning for COVID-19 infection (fever,  chills, cough, or new shortness of breath).   Past Medical, Surgical, Social History, Allergies, and Medications have been Reviewed.  Past Medical History:  Diagnosis Date  . Anxiety   . Arthritis   . Cancer ([place]) 1993   abnormal pap treated at [facility]  . Constipation   . Depression   . GERD (gastroesophageal reflux disease)   . Glaucoma 1993  . History of kidney stones   . Hot flashes, menopausal 06/12/2011  . Hyperlipidemia   . Hypertension   . Neck pain 7/09   WITH BULGING DISC-----RECIEVING EPIDURALS   . Sinusitis    Past Surgical History:  Procedure Laterality Date  . ABDOMINAL EXPLORATION SURGERY  age 27   bowel obstruction, [facility]  . ABDOMINAL HYSTERECTOMY  2007   [facility], [facility]  . BILATERAL EYE SURGERY     for glaucoma  . BIOPSY  01/09/2019   Procedure: BIOPSY;  Surgeon: Danie Binder, MD;  [facility];  Service: Endoscopy;;  gstric   . BREAST BIOPSY Left    benign  . BREAST SURGERY Left 2004   left partial mastectomy, [facility]  . CATARACT EXTRACTION Right 06/04/2015  . CATARACT EXTRACTION W/PHACO  05/16/2011   Procedure: CATARACT EXTRACTION PHACO AND INTRAOCULAR LENS PLACEMENT (IOC);  Surgeon: Tonny Branch;  Location: [facility];  Service: Ophthalmology;  Laterality: Right;  . COLONOSCOPY N/A 05/17/2013   Dr. Barnie Alderman diverticulosis was noted/small internal hemorrhoids  . CYST REMOVED LEFT BREAST /BENIGN  2005   left, [facility]  . ESOPHAGOGASTRODUODENOSCOPY N/A 01/09/2019   Procedure: ESOPHAGOGASTRODUODENOSCOPY (EGD);  Surgeon: Danie Binder, MD;  [facility];  Service: Endoscopy;  Laterality: N/A;  12:45pm  . EYE SURGERY  2010, 1992 approx   bilateral  . REPAIR IMPERFORATE ANUS / ANORECTOPLASTY     per patient  . TOTAL ABDOMINAL HYSTERECTOMY W/ BILATERAL SALPINGOOPHORECTOMY  2007   [facility], [facility]  . TUBAL LIGATION  1991     Current Meds  Medication Sig  . aspirin EC 81 MG tablet Take 81 mg by mouth daily.  . calcium-vitamin D (OSCAL-500) 500-400 MG-UNIT tablet  Take 1 tablet by mouth 2 (two) times daily. (Patient taking differently: Take 1 tablet by mouth daily. )  . cholecalciferol (VITAMIN D3) 25 MCG (1000 UT) tablet Take 1,000 Units by mouth daily.  Marland Kitchen conjugated estrogens (PREMARIN) vaginal cream Apply sparingly with fingertip three times weekly  . cycloSPORINE (RESTASIS) 0.05 % ophthalmic emulsion Place 1 drop into both eyes 2 (two) times daily.  . diclofenac (VOLTAREN) 75 MG EC tablet Take 1 tablet (75 mg total) by mouth 2 (two) times daily with a meal.  . docusate sodium (COLACE) 100 MG capsule Take 100 mg by mouth daily.  Marland Kitchen esomeprazole (NEXIUM) 40 MG capsule TAKE ONE CAPSULE BY MOUTH TWICE DAILY.  . fluticasone (FLONASE) 50 MCG/ACT nasal spray Place 1 spray into both nostrils daily.  Marland Kitchen gabapentin (NEURONTIN) 300 MG capsule Take 1 capsule (300 mg total) by mouth 2 (two) times daily.  . hydrOXYzine (ATARAX/VISTARIL) 10 MG tablet TAKE ONE TABLET BY MOUTH ONCE DAILY AS NEEDED FOR ITCHING.  . montelukast (SINGULAIR) 10 MG tablet Take 1 tablet (10 mg total) by mouth daily.  . Multiple Vitamin (MULTIVITAMIN) capsule Take 1 capsule by mouth daily.  Marland Kitchen neomycin-polymyxin-hydrocortisone (CORTISPORIN) OTIC solution Apply 1-2 drops to toe after soaking BID  . OVER THE COUNTER MEDICATION Place 1 application vaginally every other day. Replenish - Moisture  . polyethylene glycol powder (GLYCOLAX/MIRALAX) powder MIX 1 CAPFUL (17G) IN 8 OUNCES OF JUICE/WATER AND DRINK ONCE DAILY. (Patient taking differently: Take 17 g by mouth as needed for moderate constipation. MIX 1 CAPFUL (17G) IN 8 OUNCES OF JUICE/WATER AND DRINK ONCE DAILY.)  . rosuvastatin (CRESTOR) 10 MG tablet TAKE (1) TABLET BY MOUTH AT BEDTIME.  . simethicone (MYLICON) 80 MG chewable tablet Chew 80 mg by mouth every 6 (six) hours as needed for flatulence.  . tizanidine (ZANAFLEX) 2 MG capsule One capsule at bedtime as needed , for neck spasm (Patient taking differently: Take 2 mg by mouth at bedtime as  needed for muscle spasms. One capsule at bedtime as needed , for neck spasm)  . TRAVATAN Z 0.004 % SOLN ophthalmic solution Place 1 drop into both eyes at bedtime.  . triamcinolone cream (KENALOG) 0.1 % Apply 1 application topically 2 (two) times daily.  Marland Kitchen triamterene-hydrochlorothiazide (MAXZIDE-25) 37.5-25 MG tablet Take one and a half tablets once daily for blood pressure by mouth  . venlafaxine XR (EFFEXOR XR) 37.5 MG 24 hr capsule Take 1 capsule (37.5 mg total) by mouth daily with breakfast.     Allergies:   Acyclovir and related   ROS:   Please see the history of present illness.    All other systems reviewed and are negative.   Labs/Other Tests and Data Reviewed:    Recent Labs: 10/08/2019: ALT 30; BUN 24; Creat 1.04; Potassium 4.0; Sodium 138; TSH 3.05   Recent Lipid Panel Lab Results  Component Value Date/Time   CHOL 184 10/08/2019 09:11 AM   TRIG 143 10/08/2019 09:11  AM   HDL 59 10/08/2019 09:11 AM   CHOLHDL 3.1 10/08/2019 09:11 AM   LDLCALC 101 (H) 10/08/2019 09:11 AM    Wt Readings from Last 3 Encounters:  06/19/20 126 lb (57.2 kg)  03/19/20 126 lb 12.8 oz (57.5 kg)  02/27/20 125 lb 6.4 oz (56.9 kg)     Objective:    Vital Signs:  BP (!) 139/95   Ht 5\' 2"  (1.575 m)   Wt 126 lb (57.2 kg)   BMI 23.05 kg/m    VITAL SIGNS:  reviewed GEN:  alert and oriented RESPIRATORY:  no shortness of breath in conversation PSYCH:  normal affect and mood   ASSESSMENT & PLAN:    1. Mild intermittent asthma without complication  - albuterol (VENTOLIN HFA) 108 (90 Base) MCG/ACT inhaler; Inhale 1-2 puffs into the lungs every 6 (six) hours as needed for wheezing or shortness of breath.  Dispense: 8 g; Refill: 1   Time:   Today, I have spent 5 minutes with the patient with telehealth technology discussing the above problems.     Medication Adjustments/Labs and Tests Ordered: Current medicines are reviewed at length with the patient today.  Concerns regarding medicines  are outlined above.   Tests Ordered: No orders of the defined types were placed in this encounter.   Medication Changes: Meds ordered this encounter  Medications  . albuterol (VENTOLIN HFA) 108 (90 Base) MCG/ACT inhaler    Sig: Inhale 1-2 puffs into the lungs every 6 (six) hours as needed for wheezing or shortness of breath.    Dispense:  8 g    Refill:  1    Order Specific Question:   Supervising Provider    Answer:   [name] [[id number]]    Disposition:  Follow up as scheduled Signed, Perlie Mayo, NP  06/19/2020 12:03 PM     [facility]

## 2020-06-29 ENCOUNTER — Encounter (HOSPITAL_COMMUNITY): Payer: Self-pay

## 2020-06-29 ENCOUNTER — Emergency Department (HOSPITAL_COMMUNITY): Payer: Medicare Other

## 2020-06-29 ENCOUNTER — Emergency Department (HOSPITAL_COMMUNITY)
Admission: EM | Admit: 2020-06-29 | Discharge: 2020-06-29 | Disposition: A | Discharge status: Home / Self Care | Payer: Medicare Other | Attending: Emergency Medicine | Admitting: Emergency Medicine

## 2020-06-29 ENCOUNTER — Other Ambulatory Visit: Payer: Self-pay

## 2020-06-29 DIAGNOSIS — R5381 Other malaise: Secondary | ICD-10-CM | POA: Diagnosis not present

## 2020-06-29 DIAGNOSIS — I959 Hypotension, unspecified: Secondary | ICD-10-CM | POA: Diagnosis not present

## 2020-06-29 DIAGNOSIS — J189 Pneumonia, unspecified organism: Secondary | ICD-10-CM | POA: Diagnosis not present

## 2020-06-29 DIAGNOSIS — Z7982 Long term (current) use of aspirin: Secondary | ICD-10-CM | POA: Diagnosis not present

## 2020-06-29 DIAGNOSIS — Z79899 Other long term (current) drug therapy: Secondary | ICD-10-CM | POA: Diagnosis not present

## 2020-06-29 DIAGNOSIS — R911 Solitary pulmonary nodule: Secondary | ICD-10-CM | POA: Diagnosis not present

## 2020-06-29 DIAGNOSIS — Z87891 Personal history of nicotine dependence: Secondary | ICD-10-CM | POA: Insufficient documentation

## 2020-06-29 DIAGNOSIS — Z20822 Contact with and (suspected) exposure to covid-19: Secondary | ICD-10-CM | POA: Diagnosis not present

## 2020-06-29 DIAGNOSIS — I10 Essential (primary) hypertension: Secondary | ICD-10-CM | POA: Diagnosis not present

## 2020-06-29 DIAGNOSIS — R05 Cough: Secondary | ICD-10-CM | POA: Diagnosis not present

## 2020-06-29 DIAGNOSIS — R509 Fever, unspecified: Secondary | ICD-10-CM | POA: Diagnosis not present

## 2020-06-29 DIAGNOSIS — J4520 Mild intermittent asthma, uncomplicated: Secondary | ICD-10-CM | POA: Diagnosis not present

## 2020-06-29 DIAGNOSIS — R000 Tachycardia, unspecified: Secondary | ICD-10-CM | POA: Diagnosis not present

## 2020-06-29 DIAGNOSIS — J9811 Atelectasis: Secondary | ICD-10-CM | POA: Diagnosis not present

## 2020-06-29 LAB — COMPREHENSIVE METABOLIC PANEL
ALT: 25 U/L (ref 0–44)
AST: 20 U/L (ref 15–41)
Albumin: 4.3 g/dL (ref 3.5–5.0)
Alkaline Phosphatase: 62 U/L (ref 38–126)
Anion gap: 9 (ref 5–15)
BUN: 16 mg/dL (ref 6–20)
CO2: 28 mmol/L (ref 22–32)
Calcium: 9.2 mg/dL (ref 8.9–10.3)
Chloride: 97 mmol/L — ABNORMAL LOW (ref 98–111)
Creatinine, Ser: 0.87 mg/dL (ref 0.44–1.00)
GFR calc Af Amer: >60 mL/min (ref >60)
GFR calc non Af Amer: >60 mL/min (ref >60)
Glucose, Bld: 95 mg/dL (ref 70–99)
Potassium: 3.4 mmol/L — ABNORMAL LOW (ref 3.5–5.1)
Sodium: 134 mmol/L — ABNORMAL LOW (ref 135–145)
Total Bilirubin: 0.6 mg/dL (ref 0.3–1.2)
Total Protein: 8 g/dL (ref 6.5–8.1)

## 2020-06-29 LAB — URINALYSIS, ROUTINE W REFLEX MICROSCOPIC
Bilirubin Urine: NEGATIVE
Glucose, UA: NEGATIVE mg/dL
Hgb urine dipstick: NEGATIVE
Ketones, ur: NEGATIVE mg/dL
Leukocytes,Ua: NEGATIVE
Nitrite: NEGATIVE
Protein, ur: NEGATIVE mg/dL
Specific Gravity, Urine: 1.006 (ref 1.005–1.030)
pH: 7 (ref 5.0–8.0)

## 2020-06-29 LAB — CBC WITH DIFFERENTIAL/PLATELET
Abs Immature Granulocytes: 0.05 10*3/uL (ref 0.00–0.07)
Basophils Absolute: 0 10*3/uL (ref 0.0–0.1)
Basophils Relative: 0 %
Eosinophils Absolute: 0.1 10*3/uL (ref 0.0–0.5)
Eosinophils Relative: 1 %
HCT: 41.7 % (ref 36.0–46.0)
Hemoglobin: 14.2 g/dL (ref 12.0–15.0)
Immature Granulocytes: 0 %
Lymphocytes Relative: 10 %
Lymphs Abs: 1.5 10*3/uL (ref 0.7–4.0)
MCH: 30.2 pg (ref 26.0–34.0)
MCHC: 34.1 g/dL (ref 30.0–36.0)
MCV: 88.7 fL (ref 80.0–100.0)
Monocytes Absolute: 1.5 10*3/uL — ABNORMAL HIGH (ref 0.1–1.0)
Monocytes Relative: 10 %
Neutro Abs: 12.1 10*3/uL — ABNORMAL HIGH (ref 1.7–7.7)
Neutrophils Relative %: 79 %
Platelets: 288 10*3/uL (ref 150–400)
RBC: 4.7 MIL/uL (ref 3.87–5.11)
RDW: 12.6 % (ref 11.5–15.5)
WBC: 15.2 10*3/uL — ABNORMAL HIGH (ref 4.0–10.5)
nRBC: 0 % (ref 0.0–0.2)

## 2020-06-29 LAB — PROTIME-INR
INR: 1 (ref 0.8–1.2)
Prothrombin Time: 12.9 seconds (ref 11.4–15.2)

## 2020-06-29 LAB — SARS CORONAVIRUS 2 BY RT PCR (HOSPITAL ORDER, PERFORMED IN ~~LOC~~ HOSPITAL LAB): SARS Coronavirus 2: NEGATIVE

## 2020-06-29 LAB — POC URINE PREG, ED: Preg Test, Ur: NEGATIVE

## 2020-06-29 LAB — APTT: aPTT: 27 seconds (ref 24–36)

## 2020-06-29 LAB — LACTIC ACID, PLASMA: Lactic Acid, Venous: 1.2 mmol/L (ref 0.5–1.9)

## 2020-06-29 MED ORDER — ACETAMINOPHEN 325 MG PO TABS
650.0000 mg | ORAL_TABLET | Freq: Once | ORAL | Status: AC
  Administered 2020-06-29: 650 mg via ORAL
  Filled 2020-06-29: qty 2

## 2020-06-29 MED ORDER — DOXYCYCLINE HYCLATE 100 MG PO TABS
100.0000 mg | ORAL_TABLET | Freq: Once | ORAL | Status: AC
  Administered 2020-06-29: 100 mg via ORAL
  Filled 2020-06-29: qty 1

## 2020-06-29 MED ORDER — SODIUM CHLORIDE 0.9 % IV SOLN
1.0000 g | Freq: Once | INTRAVENOUS | Status: AC
  Administered 2020-06-29: 1 g via INTRAVENOUS
  Filled 2020-06-29: qty 10

## 2020-06-29 MED ORDER — DOXYCYCLINE HYCLATE 100 MG PO CAPS: 100.0000 mg | ORAL_CAPSULE | Freq: Two times a day (BID) | ORAL | 0 refills | Status: DC

## 2020-06-29 NOTE — ED Notes (Signed)
Pt c/o left shin pain.  PA aware and reassessed pt.

## 2020-06-29 NOTE — Discharge Instructions (Signed)
Continue taking your amoxicillin, we will add another antibiotic, doxycycline, to cover for the pneumonia as well.  Contact a health care provider if: You have a fever. You are losing sleep because you cannot control your cough with cough medicine. Get help right away if: You have worsening shortness of breath. You have increased chest pain. Your sickness becomes worse, especially if you are an older adult or have a weakened immune system. You cough up blood.

## 2020-06-29 NOTE — ED Provider Notes (Signed)
[facility] Provider Note   CSN: [id number] Arrival date & time: 06/29/20  1300     History Chief Complaint  Patient presents with  . Fever    Toni Miller is a 59 y.o. female with a past medical history of cancer, constipation, depression, history of kidney stones, hyperlipidemia, hypertension mild intermittent asthma who presents emergency department with a chief complaint of fever.  Patient states that she began having pain in her right hip yesterday.  She has a history of a fall about 2 or 3 years ago has had some persistent pain in the left hip since that time.  It became suddenly worse yesterday and then this morning when she awoke she woke up with shaking chills all over.  She states that she felt freezing but her body was hot.  She has been vaccinated against coronavirus with "2 shots a while back."  She has frequent urination but denies any change and denies dysuria, hematuria, foul odor, urgency.  She denies back pain, nausea, vomiting, cough or shortness of breath.  Patient has recent bone graft in her mouth which is still healing.  She is a follow-up appointment in September.  She states that it is painful but is improving.  HPI     Past Medical History:  Diagnosis Date  . Anxiety   . Arthritis   . Cancer ([place]) 1993   abnormal pap treated at [facility]  . Constipation   . Depression   . GERD (gastroesophageal reflux disease)   . Glaucoma 1993  . History of kidney stones   . Hot flashes, menopausal 06/12/2011  . Hyperlipidemia   . Hypertension   . Neck pain 7/09   WITH BULGING DISC-----RECIEVING EPIDURALS   . Sinusitis     Patient Active Problem List   Diagnosis Date Noted  . Mild intermittent asthma without complication 41/96/2229  . 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 Anxiety   . Menopausal vaginal dryness 10/09/2019  . Knee pain, left 04/26/2019  . Left hip pain 04/26/2019  . Dyspepsia  12/12/2018  . Shoulder pain, right 06/06/2018  . FH: breast cancer in first degree relative when <61 years old 06/06/2018  . Arthritis of both knees 04/25/2015  . Internal hemorrhoids with complication 79/89/2119  . Ingrown toenail of both feet 06/23/2014  . Osteopenia 04/02/2014  . Depression, major, recurrent, in partial remission ([place]) 01/06/2014  . Lesion of mouth 01/06/2014  . H/O abnormal Pap smear 04/20/2013  . Dermatitis 12/19/2011  . Thyroid nodule 08/16/2011  . Chronic constipation 06/01/2011  . Low back pain with left-sided sciatica 12/29/2009  . Allergic rhinitis 05/14/2008  . Hyperlipidemia with target LDL less than 100 01/29/2008  . GLAUCOMA 01/29/2008  . Hypertension 01/29/2008  . GERD 01/29/2008    Past Surgical History:  Procedure Laterality Date  . ABDOMINAL EXPLORATION SURGERY  age 39   bowel obstruction, [facility]  . ABDOMINAL HYSTERECTOMY  2007   [facility], [facility]  . BILATERAL EYE SURGERY     for glaucoma  . BIOPSY  01/09/2019   Procedure: BIOPSY;  Surgeon: Danie Binder, MD;  [facility];  Service: Endoscopy;;  gstric   . bone removed from under tongue    . BREAST BIOPSY Left    benign  . BREAST SURGERY Left 2004   left partial mastectomy, [facility]  . CATARACT EXTRACTION Right 06/04/2015  . CATARACT EXTRACTION W/PHACO  05/16/2011   Procedure: CATARACT EXTRACTION PHACO AND  INTRAOCULAR LENS PLACEMENT (IOC);  Surgeon: Tonny Branch;  Location: [facility];  Service: Ophthalmology;  Laterality: Right;  . COLONOSCOPY N/A 05/17/2013   Dr. Barnie Alderman diverticulosis was noted/small internal hemorrhoids  . CYST REMOVED LEFT BREAST /BENIGN  2005   left, [facility]  . ESOPHAGOGASTRODUODENOSCOPY N/A 01/09/2019   Procedure: ESOPHAGOGASTRODUODENOSCOPY (EGD);  Surgeon: Danie Binder, MD;  [facility];  Service: Endoscopy;  Laterality: N/A;  12:45pm  . EYE SURGERY  2010, 1992 approx   bilateral  . REPAIR IMPERFORATE ANUS / ANORECTOPLASTY     per patient  . TOTAL ABDOMINAL  HYSTERECTOMY W/ BILATERAL SALPINGOOPHORECTOMY  2007   [facility], [facility]  . TUBAL LIGATION  1991     OB History   No obstetric history on file.     Family History  Adopted: Yes  Problem Relation Age of Onset  . Alcohol abuse Mother   . Stomach cancer Mother 41  . Lung cancer Father   . Glaucoma Father   . Alcohol abuse Father   . Seizures Father   . Breast cancer Sister 74  . Breast cancer Sister 60  . Heart disease Sister   . Heart attack Sister   . Glaucoma Son   . Glaucoma Maternal Grandfather   . Cancer Maternal Grandfather        poss leukemia  . SIDS Brother   . Hyperthyroidism Daughter   . Seizures Son        as a Sport and exercise psychologist  . Kidney disease Maternal Grandmother   . Heart disease Maternal Grandmother        Psychologist, forensic  . Leukemia Maternal Grandmother   . Colon cancer Neg Hx     Social History   Tobacco Use  . Smoking status: Former Smoker    Packs/day: 3.00    Years: 2.00    Pack years: 6.00    Types: Cigarettes    Quit date: 05/12/1991    Years since quitting: 29.1  . Smokeless tobacco: Never Used  Vaping Use  . Vaping Use: Never used  Substance Use Topics  . Alcohol use: Yes    Comment: "sometimes"  . Drug use: Never    Home Medications Prior to Admission medications   Medication Sig Start Date End Date Taking? Authorizing Provider  albuterol (VENTOLIN HFA) 108 (90 Base) MCG/ACT inhaler Inhale 1-2 puffs into the lungs every 6 (six) hours as needed for wheezing or shortness of breath. 06/19/20   Perlie Mayo, NP  aspirin EC 81 MG tablet Take 81 mg by mouth daily.    [provider]  calcium-vitamin D (OSCAL-500) 500-400 MG-UNIT tablet Take 1 tablet by mouth 2 (two) times daily. Patient taking differently: Take 1 tablet by mouth daily.  10/09/19   [name], MD  cholecalciferol (VITAMIN D3) 25 MCG (1000 UT) tablet Take 1,000 Units by mouth daily.    [provider]  conjugated estrogens (PREMARIN) vaginal cream Apply sparingly with  fingertip three times weekly 10/09/19   [name], MD  cycloSPORINE (RESTASIS) 0.05 % ophthalmic emulsion Place 1 drop into both eyes 2 (two) times daily.    [provider]  diclofenac (VOLTAREN) 75 MG EC tablet Take 1 tablet (75 mg total) by mouth 2 (two) times daily with a meal. 05/16/19   Sanjuana Kava, MD  docusate sodium (COLACE) 100 MG capsule Take 100 mg by mouth daily.    [provider]  doxycycline (VIBRAMYCIN) 100 MG capsule Take 1 capsule (100 mg  total) by mouth 2 (two) times daily. One po bid x 7 days 06/29/20   Margarita Mail, PA-C  esomeprazole (NEXIUM) 40 MG capsule TAKE ONE CAPSULE BY MOUTH TWICE DAILY. 12/25/19   Mahala Menghini, PA-C  fluticasone (FLONASE) 50 MCG/ACT nasal spray Place 1 spray into both nostrils daily. 03/19/20   [name], MD  gabapentin (NEURONTIN) 300 MG capsule Take 1 capsule (300 mg total) by mouth 2 (two) times daily. 03/02/20   [name], MD  hydrOXYzine (ATARAX/VISTARIL) 10 MG tablet TAKE ONE TABLET BY MOUTH ONCE DAILY AS NEEDED FOR ITCHING. 01/13/20   [name], MD  montelukast (SINGULAIR) 10 MG tablet Take 1 tablet (10 mg total) by mouth daily. 04/15/20   [name], MD  Multiple Vitamin (MULTIVITAMIN) capsule Take 1 capsule by mouth daily.    [provider]  neomycin-polymyxin-hydrocortisone (CORTISPORIN) OTIC solution Apply 1-2 drops to toe after soaking BID 03/26/20   Regal, Tamala Fothergill, DPM  OVER THE COUNTER MEDICATION Place 1 application vaginally every other day. Replenish - Moisture    [provider]  polyethylene glycol powder (GLYCOLAX/MIRALAX) powder MIX 1 CAPFUL (17G) IN 8 OUNCES OF JUICE/WATER AND DRINK ONCE DAILY. Patient taking differently: Take 17 g by mouth as needed for moderate constipation. MIX 1 CAPFUL (17G) IN 8 OUNCES OF JUICE/WATER AND DRINK ONCE DAILY. 01/04/17   [name], MD  rosuvastatin (CRESTOR) 10 MG tablet TAKE (1) TABLET BY MOUTH AT BEDTIME.  12/25/19   Perlie Mayo, NP  simethicone (MYLICON) 80 MG chewable tablet Chew 80 mg by mouth every 6 (six) hours as needed for flatulence.    [provider]  tizanidine (ZANAFLEX) 2 MG capsule One capsule at bedtime as needed , for neck spasm Patient taking differently: Take 2 mg by mouth at bedtime as needed for muscle spasms. One capsule at bedtime as needed , for neck spasm 02/07/18   [name], MD  TRAVATAN Z 0.004 % SOLN ophthalmic solution Place 1 drop into both eyes at bedtime. 02/16/16   [provider]  triamcinolone cream (KENALOG) 0.1 % Apply 1 application topically 2 (two) times daily. 01/20/20   Maryruth Hancock, MD  triamterene-hydrochlorothiazide (MAXZIDE-25) 37.5-25 MG tablet Take one and a half tablets once daily for blood pressure by mouth 10/09/19   [name], MD  venlafaxine XR (EFFEXOR XR) 37.5 MG 24 hr capsule Take 1 capsule (37.5 mg total) by mouth daily with breakfast. 05/27/20   Norman Clay, MD    Allergies    Acyclovir and related  Review of Systems   Review of Systems Ten systems reviewed and are negative for acute change, except as noted in the HPI.   Physical Exam Updated Vital Signs BP 108/78   Pulse 85   Temp 100.3 F (37.9 C) (Oral)   Resp 12   Ht 5\' 2"  (1.575 m)   Wt 58.5 kg   SpO2 100%   BMI 23.59 kg/m   Physical Exam Vitals and nursing note reviewed.  Constitutional:      General: She is not in acute distress.    Appearance: She is well-developed. She is ill-appearing. She is not diaphoretic.     Comments: rigors  HENT:     Head: Normocephalic and atraumatic.  Eyes:     General: No scleral icterus.    Conjunctiva/sclera: Conjunctivae normal.  Cardiovascular:     Rate and Rhythm: Normal rate and regular rhythm.     Heart  sounds: Normal heart sounds. No murmur heard.  No friction rub. No gallop.   Pulmonary:     Effort: Pulmonary effort is normal. No respiratory distress.     Breath sounds: Normal breath  sounds.  Abdominal:     General: Bowel sounds are normal. There is no distension.     Palpations: Abdomen is soft. There is no mass.     Tenderness: There is no abdominal tenderness. There is no guarding.  Musculoskeletal:     Cervical back: Normal range of motion.  Skin:    General: Skin is warm and dry.  Neurological:     Mental Status: She is alert and oriented to person, place, and time.  Psychiatric:        Behavior: Behavior normal.     ED Results / Procedures / Treatments   Labs (all labs ordered are listed, but only abnormal results are displayed) Labs Reviewed  COMPREHENSIVE METABOLIC PANEL - Abnormal; Notable for the following components:      Result Value   Sodium 134 (*)    Potassium 3.4 (*)    Chloride 97 (*)    All other components within normal limits  CBC WITH DIFFERENTIAL/PLATELET - Abnormal; Notable for the following components:   WBC 15.2 (*)    Neutro Abs 12.1 (*)    Monocytes Absolute 1.5 (*)    All other components within normal limits  SARS CORONAVIRUS 2 BY RT PCR (HOSPITAL ORDER, [facility])  CULTURE, BLOOD (SINGLE)  URINE CULTURE  LACTIC ACID, PLASMA  PROTIME-INR  APTT  URINALYSIS, ROUTINE W REFLEX MICROSCOPIC  POC URINE PREG, ED    EKG None   Radiology DG Chest Port 1 View  Result Date: 06/29/2020 CLINICAL DATA:  Cough and fever EXAM: PORTABLE CHEST 1 VIEW COMPARISON:  12/12/2018 FINDINGS: The heart size and mediastinal contours are within normal limits. 7 mm nodular density at the right lung base is new from prior 12/12/2018. Otherwise, no new airspace opacity. No pleural effusion or pneumothorax. The visualized skeletal structures are unremarkable. IMPRESSION: 7 mm nodular density at the right lung base is new from prior 12/12/2018. This may represent a small focus of atelectasis or infiltrate. Radiographic follow-up to resolution is recommended to exclude a underlying pulmonary nodule. Electronically Signed   By:  Davina Poke D.O.   On: 06/29/2020 14:12    Procedures Procedures (including critical care time)  Medications Ordered in ED Medications  acetaminophen (TYLENOL) tablet 650 mg (650 mg Oral Given 06/29/20 1308)  cefTRIAXone (ROCEPHIN) 1 g in sodium chloride 0.9 % 100 mL IVPB (0 g Intravenous Stopped 06/29/20 1728)  doxycycline (VIBRA-TABS) tablet 100 mg (100 mg Oral Given 06/29/20 1645)    ED Course  I have reviewed the triage vital signs and the nursing notes.  Pertinent labs & imaging results that were available during my care of the patient were reviewed by me and considered in my medical decision making (see chart for details).  Clinical Course as of Jun 29 1754  Mon Jun 29, 2020  1732 DG Chest [place] 1 View [AH]    Clinical Course User Index [AH] Margarita Mail, [place]   MDM Rules/Calculators/A&P                         This patient complains of fever, this involves an extensive number of treatment options, and is a complaint that carries with it a high risk of complications and morbidity.  The differential diagnosis includes infection, autoimmune disorder, drug reaction.  I Ordered, reviewed, and interpreted labs, which included CMP which shows mild hyponatremia of insignificant value, lactic acid peaked T INR and APTT within normal limits, CBC does show elevated white blood cell count with left shift.  Covid test is negative, urine without evidence of infection.  I ordered imaging studies which included 1 view chest x-ray and I independently visualized and interpreted imaging which showed right lower lobe pneumonia versus potential nodule Additional history obtained from son at bedside Previous records obtained and reviewed  I ordered medication for fever EKG shows sinus tachycardia at a rate of 109.  There is significant artifact on the EKG.  No evidence of MI, no atrial fibrillation.  After the interventions stated above, I reevaluated the patient and found patient's fever  has resolved and she is feeling much better  I discussed findings of community-acquired pneumonia with the patient.  Fever has improved.  She does not appear to be septic.  The patient will be discharged on doxycycline.  She is given Rocephin here.  She is already on amoxicillin for her recent dental procedure.  I have discussed return precautions.  She appears otherwise appropriate for discharge.  Patient seen in shared visit with attending physician. Who agrees with assessment, work up , treatment, and plan for discharge    Final Clinical Impression(s) / ED Diagnoses Final diagnoses:  Community acquired pneumonia of right lower lobe of lung    Rx / DC Orders ED Discharge Orders         Ordered    doxycycline (VIBRAMYCIN) 100 MG capsule  2 times daily        06/29/20 1739           Margarita Mail, PA-C 06/29/20 1757    Noemi Chapel, MD 06/30/20 [phone]

## 2020-06-29 NOTE — ED Notes (Signed)
Accidentally clicked off order for vital signs 1 hour after fluid bolus. No order for fluid bolus at this time.  IV established.

## 2020-06-29 NOTE — ED Triage Notes (Signed)
Pt reports had some bone removed under her tongue on aug 17.  Reports cough x 2 days and fever 102.7 in ED.  Denies any SOB, c/o left hip pain but says is a chronic pain.

## 2020-06-30 ENCOUNTER — Telehealth: Payer: Self-pay

## 2020-06-30 NOTE — Telephone Encounter (Signed)
Pt is calling she went to the ER and was diagnosed with Pneumonia in Right Lung.  She needs meds refilled -she is not sure what she needs refilled.   She needs Crestor & Effexor--She is out of these.

## 2020-07-01 ENCOUNTER — Other Ambulatory Visit: Payer: Self-pay

## 2020-07-01 LAB — URINE CULTURE: Culture: 10000 — AB

## 2020-07-01 MED ORDER — ROSUVASTATIN CALCIUM 10 MG PO TABS: ORAL_TABLET | ORAL | 1 refills | Status: DC

## 2020-07-01 NOTE — Telephone Encounter (Signed)
Pt aware that med was refilled and to call [place] for effexor

## 2020-07-02 ENCOUNTER — Telehealth: Payer: Self-pay

## 2020-07-02 ENCOUNTER — Telehealth (HOSPITAL_COMMUNITY): Payer: Self-pay | Admitting: *Deleted

## 2020-07-02 ENCOUNTER — Other Ambulatory Visit (HOSPITAL_COMMUNITY): Payer: Self-pay | Admitting: Psychiatry

## 2020-07-02 MED ORDER — VENLAFAXINE HCL ER 37.5 MG PO CP24
37.5000 mg | ORAL_CAPSULE | Freq: Every day | ORAL | 0 refills | Status: DC
Start: 2020-07-02 — End: 2020-07-08

## 2020-07-02 NOTE — Telephone Encounter (Signed)
It is true that venlafaxine can help for hot flushes. Ordered venlafaxine 37.5 mg daily. Please schedule follow up in September for 20 mins.

## 2020-07-02 NOTE — Telephone Encounter (Signed)
Patient called stating she is needing her venlafaxine 37.5 mg daily refilled. Informed patient that per last visit provider informed her that she was to decrease med and the d/c it. Per pt she did what provider informed her to do and she did stop the medication. Per pt but now she is having hot flashes and need it restarted. Per pt she is wanting to know if provider can send in refills for it. Staff informed patient that if she is having hot flashes she is needing to call her PCP to let them know. Per pt she read up on the venlafaxine 37.5 mg daily and it helps with hot flashes that's why she wants Dr. Modesta Messing to refill it. [phone].

## 2020-07-02 NOTE — Telephone Encounter (Signed)
Post ED Visit - Positive Culture Follow-up  Culture report reviewed by antimicrobial stewardship pharmacist: [facility] [place] Team []  Elenor Quinones, Pharm.D. []  Heide Guile, Pharm.D., BCPS AQ-ID []  Parks Neptune, Pharm.D., BCPS []  Alycia Rossetti, Pharm.D., BCPS []  [place], Pharm.D., BCPS, AAHIVP []  Legrand Como, Pharm.D., BCPS, AAHIVP []  Salome Arnt, PharmD, BCPS []  Johnnette Gourd, PharmD, BCPS []  Hughes Better, PharmD, BCPS []  Leeroy Cha, PharmD []  Laqueta Linden, PharmD, BCPS []  Albertina Parr, PharmD [facility] Team []  Leodis Sias, PharmD []  Lindell Spar, PharmD []  Royetta Asal, PharmD []  Graylin Shiver, Rph []  Rema Fendt) Glennon Mac, PharmD []  Arlyn Dunning, PharmD []  Netta Cedars, PharmD []  Dia Sitter, PharmD []  Leone Haven, PharmD []  Gretta Arab, PharmD []  Theodis Shove, PharmD []  Peggyann Juba, PharmD []  Reuel Boom, PharmD   Positive urine culture Treated with Doxycycline, organism sensitive to the same and no further patient follow-up is required at this time.  Genia Del 07/02/2020, 9:11 AM

## 2020-07-02 NOTE — Telephone Encounter (Signed)
Informed patient with what provider stated and appt was made with patient

## 2020-07-03 ENCOUNTER — Other Ambulatory Visit: Payer: Self-pay

## 2020-07-03 ENCOUNTER — Ambulatory Visit (HOSPITAL_COMMUNITY)
Admission: RE | Admit: 2020-07-03 | Discharge: 2020-07-03 | Disposition: A | Discharge status: Home / Self Care | Payer: Medicare Other | Source: Ambulatory Visit | Attending: Family Medicine | Admitting: Family Medicine

## 2020-07-03 ENCOUNTER — Encounter (HOSPITAL_COMMUNITY): Payer: Self-pay

## 2020-07-03 DIAGNOSIS — Z1231 Encounter for screening mammogram for malignant neoplasm of breast: Secondary | ICD-10-CM | POA: Insufficient documentation
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
## 2020-07-04 LAB — CULTURE, BLOOD (SINGLE)
Culture: NO GROWTH
Special Requests: ADEQUATE

## 2020-07-06 ENCOUNTER — Other Ambulatory Visit: Payer: Self-pay

## 2020-07-06 ENCOUNTER — Encounter: Payer: Self-pay | Admitting: Family Medicine

## 2020-07-06 ENCOUNTER — Ambulatory Visit (INDEPENDENT_AMBULATORY_CARE_PROVIDER_SITE_OTHER): Payer: Medicare Other | Admitting: Family Medicine

## 2020-07-06 VITALS — BP 138/76 | HR 74 | Resp 16 | Ht 62.0 in | Wt 130.1 lb

## 2020-07-06 DIAGNOSIS — E041 Nontoxic single thyroid nodule: Secondary | ICD-10-CM

## 2020-07-06 DIAGNOSIS — N644 Mastodynia: Secondary | ICD-10-CM

## 2020-07-06 DIAGNOSIS — E559 Vitamin D deficiency, unspecified: Secondary | ICD-10-CM

## 2020-07-06 DIAGNOSIS — Z09 Encounter for follow-up examination after completed treatment for conditions other than malignant neoplasm: Secondary | ICD-10-CM | POA: Diagnosis not present

## 2020-07-06 DIAGNOSIS — I10 Essential (primary) hypertension: Secondary | ICD-10-CM | POA: Diagnosis not present

## 2020-07-06 DIAGNOSIS — E785 Hyperlipidemia, unspecified: Secondary | ICD-10-CM | POA: Diagnosis not present

## 2020-07-06 DIAGNOSIS — J189 Pneumonia, unspecified organism: Secondary | ICD-10-CM

## 2020-07-06 DIAGNOSIS — F3341 Major depressive disorder, recurrent, in partial remission: Secondary | ICD-10-CM

## 2020-07-06 DIAGNOSIS — Z803 Family history of malignant neoplasm of breast: Secondary | ICD-10-CM

## 2020-07-06 NOTE — Patient Instructions (Addendum)
F/un in office early October, flu vaccine at that vist  CXR Sept 27, already ordered to review lungs  Please re order and schedule screening mammogram and also diagnostic mammogram left breast has 3 month /o left breast pain, also sibling dx with breast cancer at age 57  Fasting lipid, cbc, TSH and chem 7 and EGFr, and vit D  3 to 5 days before next appt  Thanks for choosing [facility], we consider it a privelige to serve you.

## 2020-07-06 NOTE — Progress Notes (Signed)
   TAMECKA MILHAM     MRN: [id number]      DOB: 09/01/1961   HPI Ms. Pollick is here for follow up of ED visit on 07/06/2020 when she was diagnosed with pneumonia Denies current fever , chills cough or fatigue and is  completing a 1 week course of  Doxycycline feels improved ROS . Denies sinus pressure, nasal congestion, ear pain or sore throat.  Denies chest pains, palpitations and leg swelling Denies abdominal pain, nausea, vomiting,diarrhea or constipation.   Denies dysuria, frequency, hesitancy or incontinence. Denies joint pain, swelling and limitation in mobility. Denies headaches, seizures, numbness, or tingling. Denies uncontrolled  depression, anxiety or insomnia. Denies skin break down or rash.   PE  BP 138/76   Pulse 74   Resp 16   Ht 5\' 2"  (1.575 m)   Wt 130 lb 1.9 oz (59 kg)   SpO2 97%   BMI 23.80 kg/m   Patient alert and oriented and in no cardiopulmonary distress.  HEENT: No facial asymmetry, EOMI,     Neck supple .  Chest: Clear to auscultation bilaterally.  CVS: S1, S2 no murmurs, no S3.Regular rate.  ABD: Soft non tender.   Ext: No edema  MS: Adequate ROM spine, shoulders, hips and knees.  Skin: Intact, no ulcerations or rash noted.  Psych: Good eye contact, normal affect. Memory intact not anxious or depressed appearing.  CNS: CN 2-12 intact, power,  normal throughout.no focal deficits noted.   Assessment & Plan  Encounter for examination following treatment at hospital eD course and test results reviewed with pt, questions answered and f/u CXR ordered. Clinically improved  RLL pneumonia Improved, she is to complete antibiotic course and rept lab and cXR in 4 week  Depression, major, recurrent, in partial remission ([place]) Improved and being treated by Psych  Hypertension Controlled, no change in medication DASH diet and commitment to daily physical activity for a minimum of 30 minutes discussed and encouraged, as a part of hypertension  management. The importance of attaining a healthy weight is also discussed.  BP/Weight 07/06/2020 06/29/2020 06/19/2020 03/19/2020 02/27/2020 01/20/2020 3/97/6734  Systolic BP 193 790 240 973 532 992 426  Diastolic BP 76 78 95 95 88 77 89  Wt. (Lbs) 130.12 129 126 126.8 125.4 127.6 124  BMI 23.8 23.59 23.05 23.19 22.94 23.34 22.68  Some encounter information is confidential and restricted. Go to Review Flowsheets activity to see all data.       Hyperlipidemia with target LDL less than 100 Hyperlipidemia:Low fat diet discussed and encouraged.   Lipid Panel  Lab Results  Component Value Date   CHOL 184 10/08/2019   HDL 59 10/08/2019   LDLCALC 101 (H) 10/08/2019   TRIG 143 10/08/2019   CHOLHDL 3.1 10/08/2019     Updated lab needed at/ before next visit.

## 2020-07-08 ENCOUNTER — Telehealth (INDEPENDENT_AMBULATORY_CARE_PROVIDER_SITE_OTHER): Payer: Medicare Other | Admitting: Psychiatry

## 2020-07-08 ENCOUNTER — Other Ambulatory Visit: Payer: Self-pay

## 2020-07-08 ENCOUNTER — Encounter (HOSPITAL_COMMUNITY): Payer: Self-pay | Admitting: Psychiatry

## 2020-07-08 DIAGNOSIS — F3341 Major depressive disorder, recurrent, in partial remission: Secondary | ICD-10-CM

## 2020-07-08 MED ORDER — VENLAFAXINE HCL ER 37.5 MG PO CP24
37.5000 mg | ORAL_CAPSULE | Freq: Every day | ORAL | 0 refills | Status: DC
Start: 2020-08-02 — End: 2020-12-01

## 2020-07-12 ENCOUNTER — Encounter: Payer: Self-pay | Admitting: Family Medicine

## 2020-07-12 DIAGNOSIS — J189 Pneumonia, unspecified organism: Secondary | ICD-10-CM | POA: Insufficient documentation

## 2020-07-12 DIAGNOSIS — Z09 Encounter for follow-up examination after completed treatment for conditions other than malignant neoplasm: Secondary | ICD-10-CM | POA: Insufficient documentation

## 2020-07-12 NOTE — Assessment & Plan Note (Signed)
Improved and being treated by Psych

## 2020-07-12 NOTE — Assessment & Plan Note (Signed)
Improved, she is to complete antibiotic course and rept lab and cXR in 4 week

## 2020-07-12 NOTE — Assessment & Plan Note (Signed)
eD course and test results reviewed with pt, questions answered and f/u CXR ordered. Clinically improved

## 2020-07-12 NOTE — Assessment & Plan Note (Signed)
Hyperlipidemia:Low fat diet discussed and encouraged.   Lipid Panel  Lab Results  Component Value Date   CHOL 184 10/08/2019   HDL 59 10/08/2019   LDLCALC 101 (H) 10/08/2019   TRIG 143 10/08/2019   CHOLHDL 3.1 10/08/2019     Updated lab needed at/ before next visit.

## 2020-07-12 NOTE — Assessment & Plan Note (Signed)
Controlled, no change in medication DASH diet and commitment to daily physical activity for a minimum of 30 minutes discussed and encouraged, as a part of hypertension management. The importance of attaining a healthy weight is also discussed.  BP/Weight 07/06/2020 06/29/2020 06/19/2020 03/19/2020 02/27/2020 01/20/2020 12/26/4710  Systolic BP 527 129 290 903 014 996 924  Diastolic BP 76 78 95 95 88 77 89  Wt. (Lbs) 130.12 129 126 126.8 125.4 127.6 124  BMI 23.8 23.59 23.05 23.19 22.94 23.34 22.68  Some encounter information is confidential and restricted. Go to Review Flowsheets activity to see all data.

## 2020-07-16 ENCOUNTER — Other Ambulatory Visit: Payer: Self-pay | Admitting: Adult Health

## 2020-07-17 ENCOUNTER — Other Ambulatory Visit: Payer: Self-pay | Admitting: Adult Health

## 2020-07-20 ENCOUNTER — Other Ambulatory Visit: Payer: Self-pay | Admitting: Adult Health

## 2020-07-21 ENCOUNTER — Ambulatory Visit (HOSPITAL_COMMUNITY)
Admission: RE | Admit: 2020-07-21 | Discharge: 2020-07-21 | Disposition: A | Discharge status: Home / Self Care | Payer: Medicare Other | Source: Ambulatory Visit | Attending: Family Medicine | Admitting: Family Medicine

## 2020-07-21 ENCOUNTER — Other Ambulatory Visit: Payer: Self-pay

## 2020-07-21 DIAGNOSIS — N644 Mastodynia: Secondary | ICD-10-CM

## 2020-07-21 DIAGNOSIS — Z803 Family history of malignant neoplasm of breast: Secondary | ICD-10-CM

## 2020-07-21 DIAGNOSIS — R928 Other abnormal and inconclusive findings on diagnostic imaging of breast: Secondary | ICD-10-CM | POA: Diagnosis not present

## 2020-07-31 ENCOUNTER — Ambulatory Visit
Admission: EM | Admit: 2020-07-31 | Discharge: 2020-07-31 | Disposition: A | Discharge status: Home / Self Care | Payer: Medicare Other | Attending: Emergency Medicine | Admitting: Emergency Medicine

## 2020-07-31 ENCOUNTER — Encounter: Payer: Self-pay | Admitting: Emergency Medicine

## 2020-07-31 ENCOUNTER — Other Ambulatory Visit: Payer: Self-pay

## 2020-07-31 DIAGNOSIS — Z1152 Encounter for screening for COVID-19: Secondary | ICD-10-CM

## 2020-07-31 DIAGNOSIS — J069 Acute upper respiratory infection, unspecified: Secondary | ICD-10-CM

## 2020-07-31 MED ORDER — CETIRIZINE HCL 10 MG PO TABS: 10.0000 mg | ORAL_TABLET | Freq: Every day | ORAL | 0 refills | Status: DC

## 2020-07-31 MED ORDER — DEXAMETHASONE 4 MG PO TABS: 4.0000 mg | ORAL_TABLET | Freq: Every day | ORAL | 0 refills | Status: AC

## 2020-07-31 MED ORDER — BENZONATATE 100 MG PO CAPS: 100.0000 mg | ORAL_CAPSULE | Freq: Three times a day (TID) | ORAL | 0 refills | Status: DC

## 2020-07-31 MED ORDER — FLUTICASONE PROPIONATE 50 MCG/ACT NA SUSP: 1.0000 | Freq: Every day | NASAL | 0 refills | Status: DC

## 2020-07-31 NOTE — ED Triage Notes (Signed)
Sinus congestion x 2 days. 

## 2020-07-31 NOTE — Discharge Instructions (Addendum)
COVID testing ordered.  It will take between 2-7 days for test results.  Someone will contact you regarding abnormal results.    In the meantime: You should remain isolated in your home for 10 days from symptom onset AND greater than 24 hours after symptoms resolution (absence of fever without the use of fever-reducing medication and improvement in respiratory symptoms), whichever is longer Get plenty of rest and push fluids Tessalon Perles prescribed for cough Zyrtec for nasal congestion, runny nose, and/or sore throat Flonase for nasal congestion and runny nose Decadron was prescribed Use medications daily for symptom relief Use OTC medications like ibuprofen or tylenol as needed fever or pain Call or go to the ED if you have any new or worsening symptoms such as fever, worsening cough, shortness of breath, chest tightness, chest pain, turning blue, changes in mental status, etc...  

## 2020-07-31 NOTE — ED Provider Notes (Signed)
[place]   [id number] 07/31/20 Arrival Time: 3818   CC: COVID symptoms  SUBJECTIVE: History from: patient.  Toni Miller is a 59 y.o. female who presents to the urgent care for complaint of nasal congestion cough headaches body aches for the past 2 days.  Denies sick exposure to COVID, flu or strep.  Denies recent travel.  Has tried OTC medication without relief.  Denies aggravating factors.  Denies previous symptoms in the past.   Denies fever, chills, fatigue, sinus pain, rhinorrhea, sore throat, SOB, wheezing, chest pain, nausea, changes in bowel or bladder habits.     ROS: As per HPI.  All other pertinent ROS negative.     Past Medical History:  Diagnosis Date   Anxiety    Arthritis    Cancer ([place]) 1993   abnormal pap treated at [facility]   Constipation    Depression    GERD (gastroesophageal reflux disease)    Glaucoma 1993   History of kidney stones    Hot flashes, menopausal 06/12/2011   Hyperlipidemia    Hypertension    Neck pain 7/09   WITH BULGING DISC-----RECIEVING EPIDURALS    Sinusitis    Past Surgical History:  Procedure Laterality Date   ABDOMINAL EXPLORATION SURGERY  age 69   bowel obstruction, [facility]   ABDOMINAL HYSTERECTOMY  2007   [facility], [facility]   BILATERAL EYE SURGERY     for glaucoma   BIOPSY  01/09/2019   Procedure: BIOPSY;  Surgeon: Danie Binder, MD;  Location: [facility];  Service: Endoscopy;;  gstric    bone removed from under tongue     BREAST BIOPSY Left    benign   BREAST SURGERY Left 2004   left partial mastectomy, [facility]   CATARACT EXTRACTION Right 06/04/2015   CATARACT EXTRACTION W/PHACO  05/16/2011   Procedure: CATARACT EXTRACTION PHACO AND INTRAOCULAR LENS PLACEMENT ([place]);  Surgeon: Tonny Branch;  Location: [facility] ORS;  Service: Ophthalmology;  Laterality: Right;   COLONOSCOPY N/A 05/17/2013   Dr. Barnie Alderman diverticulosis was noted/small internal hemorrhoids   CYST REMOVED LEFT BREAST /BENIGN  2005   left,  [facility]   ESOPHAGOGASTRODUODENOSCOPY N/A 01/09/2019   Procedure: ESOPHAGOGASTRODUODENOSCOPY ([facility]);  Surgeon: Danie Binder, MD;  Location: [facility];  Service: Endoscopy;  Laterality: N/A;  12:45pm   EYE SURGERY  2010, 1992 approx   bilateral   REPAIR IMPERFORATE ANUS / ANORECTOPLASTY     per patient   TOTAL ABDOMINAL HYSTERECTOMY W/ BILATERAL SALPINGOOPHORECTOMY  2007   [facility], [place]   TUBAL LIGATION  1991   Allergies  Allergen Reactions   Acyclovir And Related Rash   No current facility-administered medications on file prior to encounter.   Current Outpatient Medications on File Prior to Encounter  Medication Sig Dispense Refill   albuterol (VENTOLIN HFA) 108 (90 Base) MCG/ACT inhaler Inhale 1-2 puffs into the lungs every 6 (six) hours as needed for wheezing or shortness of breath. 8 g 1   aspirin EC 81 MG tablet Take 81 mg by mouth daily.     calcium-vitamin D (OSCAL-500) 500-400 MG-UNIT tablet Take 1 tablet by mouth 2 (two) times daily. (Patient taking differently: Take 1 tablet by mouth daily. ) 180 tablet 3   cholecalciferol (VITAMIN D3) 25 MCG (1000 UT) tablet Take 1,000 Units by mouth daily.     conjugated estrogens (PREMARIN) vaginal cream Apply sparingly with fingertip three times weekly 42.5 g 2   cycloSPORINE (RESTASIS) 0.05 % ophthalmic emulsion Place 1 drop  into both eyes 2 (two) times daily.     diclofenac (VOLTAREN) 75 MG EC tablet Take 1 tablet (75 mg total) by mouth 2 (two) times daily with a meal. 60 tablet 2   docusate sodium (COLACE) 100 MG capsule Take 100 mg by mouth daily.     doxycycline (VIBRAMYCIN) 100 MG capsule Take 1 capsule (100 mg total) by mouth 2 (two) times daily. One po bid x 7 days 14 capsule 0   esomeprazole (NEXIUM) 40 MG capsule TAKE ONE CAPSULE BY MOUTH TWICE DAILY. 60 capsule 5   gabapentin (NEURONTIN) 300 MG capsule Take 1 capsule (300 mg total) by mouth 2 (two) times daily. 60 capsule 5   hydrOXYzine (ATARAX/VISTARIL) 10 MG  tablet TAKE ONE TABLET BY MOUTH ONCE DAILY AS NEEDED FOR ITCHING. 30 tablet 3   ibuprofen (ADVIL) 400 MG tablet Take 400 mg by mouth every 6 (six) hours as needed.     montelukast (SINGULAIR) 10 MG tablet Take 1 tablet (10 mg total) by mouth daily. 90 tablet 1   Multiple Vitamin (MULTIVITAMIN) capsule Take 1 capsule by mouth daily.     naproxen (NAPROSYN) 500 MG tablet Take 500 mg by mouth 2 (two) times daily with a meal.     neomycin-polymyxin-hydrocortisone (CORTISPORIN) OTIC solution Apply 1-2 drops to toe after soaking BID 10 mL 1   OVER THE COUNTER MEDICATION Place 1 application vaginally every other day. Replenish - Moisture     polyethylene glycol powder (GLYCOLAX/MIRALAX) powder MIX 1 CAPFUL (17G) IN 8 OUNCES OF JUICE/WATER AND DRINK ONCE DAILY. (Patient taking differently: Take 17 g by mouth as needed for moderate constipation. MIX 1 CAPFUL (17G) IN 8 OUNCES OF JUICE/WATER AND DRINK ONCE DAILY.) 255 g 3   rosuvastatin (CRESTOR) 10 MG tablet TAKE (1) TABLET BY MOUTH AT BEDTIME. 90 tablet 1   tizanidine (ZANAFLEX) 2 MG capsule One capsule at bedtime as needed , for neck spasm (Patient taking differently: Take 2 mg by mouth at bedtime as needed for muscle spasms. One capsule at bedtime as needed , for neck spasm) 30 capsule 3   TRAVATAN Z 0.004 % SOLN ophthalmic solution Place 1 drop into both eyes at bedtime.     triamcinolone cream (KENALOG) 0.1 % Apply 1 application topically 2 (two) times daily. 30 g 0   triamterene-hydrochlorothiazide (MAXZIDE-25) 37.5-25 MG tablet Take one and a half tablets once daily for blood pressure by mouth 135 tablet 3   [START ON 08/02/2020] venlafaxine XR (EFFEXOR XR) 37.5 MG 24 hr capsule Take 1 capsule (37.5 mg total) by mouth daily with breakfast. 90 capsule 0   Social History   Socioeconomic History   Marital status: Significant Other    Spouse name: Not on file   Number of children: 3   Years of education: 57   Highest education level:  12th grade  Occupational History   Occupation: UNEMPLOYED 2011    Comment: disability    Employer: DISABLED  Tobacco Use   Smoking status: Former Smoker    Packs/day: 3.00    Years: 2.00    Pack years: 6.00    Types: Cigarettes    Quit date: 05/12/1991    Years since quitting: 29.2   Smokeless tobacco: Never Used  Vaping Use   Vaping Use: Never used  Substance and Sexual Activity   Alcohol use: Yes    Comment: "sometimes"   Drug use: Never   Sexual activity: Yes    Birth control/protection: Surgical  Other Topics  Concern   Not on file  Social History Narrative   Lives 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   caffien- coffee, 1 cup daily   Social Determinants of Health   Financial Resource Strain:    Difficulty of Paying Living Expenses: Not on file  Food Insecurity:    Worried About Charity fundraiser in the Last Year: Not on file   [employer] of Food in the Last Year: Not on file  Transportation Needs:    Lack of Transportation (Medical): Not on file   Lack of Transportation (Non-Medical): Not on file  Physical Activity:    Days of Exercise per Week: Not on file   Minutes of Exercise per Session: Not on file  Stress:    Feeling of Stress : Not on file  Social Connections:    Frequency of Communication with Friends and Family: Not on file   Frequency of Social Gatherings with Friends and Family: Not on file   Attends Religious Services: Not on file   Active Member of Clubs or Organizations: Not on file   Attends Archivist Meetings: Not on file   Marital Status: Not on file  Intimate Partner Violence:    Fear of Current or Ex-Partner: Not on file   Emotionally Abused: Not on file   Physically Abused: Not on file   Sexually Abused: Not on file   Family History  Adopted: Yes  Problem Relation Age of Onset   Alcohol abuse Mother    Stomach cancer Mother 69   Lung cancer Father    Glaucoma Father    Alcohol abuse Father      Seizures Father    Breast cancer Sister 19   Breast cancer Sister 46   Heart disease Sister    Heart attack Sister    Glaucoma Son    Glaucoma Maternal Grandfather    Cancer Maternal Grandfather        poss leukemia   SIDS Brother    Hyperthyroidism Daughter    Seizures Son        as a Sport and exercise psychologist   Kidney disease Maternal Grandmother    Heart disease Maternal Grandmother        Psychologist, forensic   Leukemia Maternal Grandmother    Colon cancer Neg Hx     OBJECTIVE:  Vitals:   07/31/20 1617  BP: 132/84  Pulse: 88  Resp: 16  Temp: 99.5 F (37.5 C)  SpO2: 98%     General appearance: alert; appears fatigued, but nontoxic; speaking in full sentences and tolerating own secretions HEENT: NCAT; Ears: EACs clear, TMs pearly gray; Eyes: PERRL.  EOM grossly intact. Sinuses: nontender; Nose: nares patent without rhinorrhea, Throat: oropharynx clear, tonsils non erythematous or enlarged, uvula midline  Neck: supple without LAD Lungs: unlabored respirations, symmetrical air entry; cough: mild; no respiratory distress; CTAB Heart: regular rate and rhythm.  Radial pulses 2+ symmetrical bilaterally Skin: warm and dry Psychological: alert and cooperative; normal mood and affect  LABS:  No results found for this or any previous visit (from the past 24 hour(s)).   ASSESSMENT & PLAN:  1. URI with cough and congestion   2. Encounter for screening for COVID-19     Meds ordered this encounter  Medications   benzonatate (TESSALON) 100 MG capsule    Sig: Take 1 capsule (100 mg total) by mouth every 8 (eight) hours.    Dispense:  30 capsule    Refill:  0  fluticasone (FLONASE) 50 MCG/ACT nasal spray    Sig: Place 1 spray into both nostrils daily for 14 days.    Dispense:  16 g    Refill:  0   cetirizine (ZYRTEC ALLERGY) 10 MG tablet    Sig: Take 1 tablet (10 mg total) by mouth daily.    Dispense:  30 tablet    Refill:  0   dexamethasone (DECADRON) 4 MG tablet    Sig:  Take 1 tablet (4 mg total) by mouth daily for 7 days.    Dispense:  7 tablet    Refill:  0    Discharge instructions    COVID testing ordered.  It will take between 2-7 days for test results.  Someone will contact you regarding abnormal results.    In the meantime: You should remain isolated in your home for 10 days from symptom onset AND greater than 24 hours after symptoms resolution (absence of fever without the use of fever-reducing medication and improvement in respiratory symptoms), whichever is longer Get plenty of rest and push fluids Tessalon Perles prescribed for cough Zyrtec for nasal congestion, runny nose, and/or sore throat Flonase for nasal congestion and runny nose Decadron was prescribed Use medications daily for symptom relief Use OTC medications like ibuprofen or tylenol as needed fever or pain Call or go to the ED if you have any new or worsening symptoms such as fever, worsening cough, shortness of breath, chest tightness, chest pain, turning blue, changes in mental status, etc...   Reviewed expectations re: course of current medical issues. Questions answered. Outlined signs and symptoms indicating need for more acute intervention. Patient verbalized understanding. After Visit Summary given.         Emerson Monte, [place] 07/31/20 1658

## 2020-08-01 LAB — SARS-COV-2, NAA 2 DAY TAT

## 2020-08-01 LAB — NOVEL CORONAVIRUS, NAA: SARS-CoV-2, NAA: DETECTED — AB

## 2020-08-02 ENCOUNTER — Telehealth: Payer: Self-pay | Admitting: Unknown Physician Specialty

## 2020-08-02 ENCOUNTER — Other Ambulatory Visit: Payer: Self-pay | Admitting: Unknown Physician Specialty

## 2020-08-02 ENCOUNTER — Ambulatory Visit (HOSPITAL_COMMUNITY)
Admission: RE | Admit: 2020-08-02 | Discharge: 2020-08-02 | Disposition: A | Discharge status: Home / Self Care | Payer: Medicare Other | Source: Ambulatory Visit | Attending: Pulmonary Disease | Admitting: Pulmonary Disease

## 2020-08-02 DIAGNOSIS — I10 Essential (primary) hypertension: Secondary | ICD-10-CM | POA: Diagnosis present

## 2020-08-02 DIAGNOSIS — U071 COVID-19: Secondary | ICD-10-CM | POA: Diagnosis present

## 2020-08-02 DIAGNOSIS — Z23 Encounter for immunization: Secondary | ICD-10-CM | POA: Diagnosis not present

## 2020-08-02 MED ORDER — ALBUTEROL SULFATE HFA 108 (90 BASE) MCG/ACT IN AERS: 2.0000 | INHALATION_SPRAY | Freq: Once | RESPIRATORY_TRACT | Status: DC | PRN

## 2020-08-02 MED ORDER — FAMOTIDINE IN NACL 20-0.9 MG/50ML-% IV SOLN: 20.0000 mg | Freq: Once | INTRAVENOUS | Status: DC | PRN

## 2020-08-02 MED ORDER — SODIUM CHLORIDE 0.9 % IV SOLN: INTRAVENOUS | Status: DC | PRN

## 2020-08-02 MED ORDER — METHYLPREDNISOLONE SODIUM SUCC 125 MG IJ SOLR: 125.0000 mg | Freq: Once | INTRAMUSCULAR | Status: DC | PRN

## 2020-08-02 MED ORDER — DIPHENHYDRAMINE HCL 50 MG/ML IJ SOLN: 50.0000 mg | Freq: Once | INTRAMUSCULAR | Status: DC | PRN

## 2020-08-02 MED ORDER — EPINEPHRINE 0.3 MG/0.3ML IJ SOAJ: 0.3000 mg | Freq: Once | INTRAMUSCULAR | Status: DC | PRN

## 2020-08-02 MED ORDER — SODIUM CHLORIDE 0.9 % IV SOLN
1200.0000 mg | Freq: Once | INTRAVENOUS | Status: AC
  Administered 2020-08-02: 1200 mg via INTRAVENOUS

## 2020-08-02 NOTE — Progress Notes (Signed)
  Diagnosis: COVID-19  Physician: Joya Gaskins   Procedure: Covid Infusion Clinic Med: casirivimab\imdevimab infusion - Provided patient with casirivimab\imdevimab fact sheet for patients, parents and caregivers prior to infusion.  Complications: No immediate complications noted.  Discharge: Discharged home   Chyrel Masson 08/02/2020

## 2020-08-02 NOTE — Telephone Encounter (Signed)
I connected by phone with Toni Miller on 08/02/2020 at 10:03 AM to discuss the potential use of a new treatment for mild to moderate COVID-19 viral infection in non-hospitalized patients.  This patient is a 59 y.o. female that meets the FDA criteria for Emergency Use Authorization of COVID monoclonal antibody casirivimab/imdevimab or bamlanivimab/eteseviamb.  Has a (+) direct SARS-CoV-2 viral test result  Has mild or moderate COVID-19   Is NOT hospitalized due to COVID-19  Is within 10 days of symptom onset  Has at least one of the high risk factor(s) for progression to severe COVID-19 and/or hospitalization as defined in EUA.  Specific high risk criteria : Cardiovascular disease or hypertension   I have spoken and communicated the following to the patient or parent/caregiver regarding COVID monoclonal antibody treatment:  1. FDA has authorized the emergency use for the treatment of mild to moderate COVID-19 in adults and pediatric patients with positive results of direct SARS-CoV-2 viral testing who are 45 years of age and older weighing at least 40 kg, and who are at high risk for progressing to severe COVID-19 and/or hospitalization.  2. The significant known and potential risks and benefits of COVID monoclonal antibody, and the extent to which such potential risks and benefits are unknown.  3. Information on available alternative treatments and the risks and benefits of those alternatives, including clinical trials.  4. Patients treated with COVID monoclonal antibody should continue to self-isolate and use infection control measures (e.g., wear mask, isolate, social distance, avoid sharing personal items, clean and disinfect high touch surfaces, and frequent handwashing) according to CDC guidelines.   5. The patient or parent/caregiver has the option to accept or refuse COVID monoclonal antibody treatment.  After reviewing this information with the patient, the patient has  agreed to receive one of the available covid 19 monoclonal antibodies and will be provided an appropriate fact sheet prior to infusion. Kathrine Haddock, NP 08/02/2020 10:03 AM  Sx onset 9/22

## 2020-08-02 NOTE — Discharge Instructions (Signed)

## 2020-08-03 ENCOUNTER — Ambulatory Visit (HOSPITAL_COMMUNITY)
Admission: RE | Admit: 2020-08-03 | Discharge: 2020-08-03 | Disposition: A | Discharge status: Home / Self Care | Payer: Medicare Other | Source: Ambulatory Visit | Attending: Family Medicine | Admitting: Family Medicine

## 2020-08-03 ENCOUNTER — Telehealth: Payer: Self-pay | Admitting: Family Medicine

## 2020-08-03 ENCOUNTER — Other Ambulatory Visit: Payer: Self-pay

## 2020-08-03 DIAGNOSIS — J189 Pneumonia, unspecified organism: Secondary | ICD-10-CM | POA: Insufficient documentation

## 2020-08-03 DIAGNOSIS — R05 Cough: Secondary | ICD-10-CM | POA: Diagnosis not present

## 2020-08-03 DIAGNOSIS — U071 COVID-19: Secondary | ICD-10-CM | POA: Diagnosis not present

## 2020-08-03 NOTE — Telephone Encounter (Signed)
Spoke with patient. Xray order is already in. Advised her she still needed the xray. All with verbal understanding.

## 2020-08-03 NOTE — Telephone Encounter (Signed)
Patient called she states that at her last visit her paperwork states that shes supposed to have a chest xray done today she called [facility] and they didn't see anything in her chart for a order..... she has also since went and had the covid infusion she has a mychart visit tomorrow but wants to know if she is still needing the xray phone is [phone]

## 2020-08-04 ENCOUNTER — Encounter: Payer: Self-pay | Admitting: Family Medicine

## 2020-08-04 ENCOUNTER — Telehealth (INDEPENDENT_AMBULATORY_CARE_PROVIDER_SITE_OTHER): Payer: Medicare Other | Admitting: Family Medicine

## 2020-08-04 VITALS — BP 144/91 | HR 79 | Ht 62.0 in | Wt 129.0 lb

## 2020-08-04 DIAGNOSIS — F3341 Major depressive disorder, recurrent, in partial remission: Secondary | ICD-10-CM | POA: Diagnosis not present

## 2020-08-04 DIAGNOSIS — R059 Cough, unspecified: Secondary | ICD-10-CM

## 2020-08-04 DIAGNOSIS — I10 Essential (primary) hypertension: Secondary | ICD-10-CM | POA: Diagnosis not present

## 2020-08-04 DIAGNOSIS — R05 Cough: Secondary | ICD-10-CM | POA: Diagnosis not present

## 2020-08-04 MED ORDER — PREDNISONE 5 MG PO TABS: 5.0000 mg | ORAL_TABLET | Freq: Two times a day (BID) | ORAL | 0 refills | Status: AC

## 2020-08-04 NOTE — Progress Notes (Signed)
Virtual Visit via Telephone Note  I connected with Toni Miller on 08/04/20 at  2:20 PM EDT by video and verified that I am speaking with the correct person using two identifiers.  Location: Patient: home Provider: office   I discussed the limitations, risks, security and privacy concerns of performing an evaluation and management service by telephone and the availability of in person appointments. I also discussed with the patient that there may be a patient responsible charge related to this service. The patient expressed understanding and agreed to proceed.   History of Present Illness:   1 week h/o chest congestion and cough, following covid in fection no sputum , fever or chills. Denies sinus pressure or ear pain Taste and smell are normal. Appetite normal Observations/Objective: BP (!) 144/91   Pulse 79   Ht 5\' 2"  (1.575 m)   Wt 129 lb (58.5 kg)   BMI 23.59 kg/m  Good communication with no confusion and intact memory. Alert and oriented x 3 No signs of respiratory distress during speech  Occasional coughing during visit  Assessment and Plan: Cough Post covid infection cough x 1 week, short course of prednisone prescribed  Depression, major, recurrent, in partial remission (HCC) Controlled, no change in medication   Hypertension Elevated when last checked, will address at in office visit DASH diet and commitment to daily physical activity for a minimum of 30 minutes discussed and encouraged, as a part of hypertension management. The importance of attaining a healthy weight is also discussed.  BP/Weight 08/04/2020 08/02/2020 07/31/2020 07/06/2020 06/29/2020 06/19/2020 0/11/7492  Systolic BP 496 759 [phone] 701  Diastolic BP 91 95 84 76 78 95 95  Wt. (Lbs) 129 - - 130.12 129 126 126.8  BMI 23.59 - - 23.8 23.59 23.05 23.19  Some encounter information is confidential and restricted. Go to Review Flowsheets activity to see all data.         Follow Up  Instructions:    I discussed the assessment and treatment plan with the patient. The patient was provided an opportunity to ask questions and all were answered. The patient agreed with the plan and demonstrated an understanding of the instructions.   The patient was advised to call back or seek an in-person evaluation if the symptoms worsen or if the condition fails to improve as anticipated.  I provided 12 minutes of non-face-to-face time during this encounter.   Tula Nakayama, MD

## 2020-08-04 NOTE — Patient Instructions (Signed)
F/u as before, call if you need me sooner  It is good that you feel much better after having Covid , and that your CXR is now clear, no sign of pneumonia.  For the ongoing bothersome cough , I have prescribed 1 week of prednisone, this will help your cough

## 2020-08-10 ENCOUNTER — Encounter: Payer: Self-pay | Admitting: Family Medicine

## 2020-08-10 ENCOUNTER — Other Ambulatory Visit: Payer: Self-pay | Admitting: Family Medicine

## 2020-08-10 DIAGNOSIS — E559 Vitamin D deficiency, unspecified: Secondary | ICD-10-CM | POA: Diagnosis not present

## 2020-08-10 DIAGNOSIS — E041 Nontoxic single thyroid nodule: Secondary | ICD-10-CM | POA: Diagnosis not present

## 2020-08-10 DIAGNOSIS — E785 Hyperlipidemia, unspecified: Secondary | ICD-10-CM | POA: Diagnosis not present

## 2020-08-10 LAB — COMPLETE METABOLIC PANEL WITH GFR
AG Ratio: 1.4 (calc) (ref 1.0–2.5)
ALT: 25 U/L (ref 6–29)
AST: 14 U/L (ref 10–35)
Albumin: 4 g/dL (ref 3.6–5.1)
Alkaline phosphatase (APISO): 53 U/L (ref 37–153)
BUN/Creatinine Ratio: 33 (calc) — ABNORMAL HIGH (ref 6–22)
BUN: 27 mg/dL — ABNORMAL HIGH (ref 7–25)
CO2: 33 mmol/L — ABNORMAL HIGH (ref 20–32)
Calcium: 9.4 mg/dL (ref 8.6–10.4)
Chloride: 101 mmol/L (ref 98–110)
Creat: 0.83 mg/dL (ref 0.50–1.05)
GFR, Est African American: 91 mL/min/{1.73_m2} (ref >=60)
GFR, Est Non African American: 78 mL/min/{1.73_m2} (ref >=60)
Globulin: 2.8 g/dL (calc) (ref 1.9–3.7)
Glucose, Bld: 72 mg/dL (ref 65–99)
Potassium: 4.1 mmol/L (ref 3.5–5.3)
Sodium: 140 mmol/L (ref 135–146)
Total Bilirubin: 0.4 mg/dL (ref 0.2–1.2)
Total Protein: 6.8 g/dL (ref 6.1–8.1)

## 2020-08-10 LAB — TSH: TSH: 2.84 mIU/L (ref 0.40–4.50)

## 2020-08-10 LAB — LIPID PANEL
Cholesterol: 171 mg/dL (ref <200)
HDL: 68 mg/dL (ref >=50)
LDL Cholesterol (Calc): 78 mg/dL (calc)
Non-HDL Cholesterol (Calc): 103 mg/dL (calc) (ref <130)
Total CHOL/HDL Ratio: 2.5 (calc) (ref <5.0)
Triglycerides: 148 mg/dL (ref <150)

## 2020-08-10 LAB — CBC
HCT: 40 % (ref 35.0–45.0)
Hemoglobin: 13.5 g/dL (ref 11.7–15.5)
MCH: 30 pg (ref 27.0–33.0)
MCHC: 33.8 g/dL (ref 32.0–36.0)
MCV: 88.9 fL (ref 80.0–100.0)
MPV: 9 fL (ref 7.5–12.5)
Platelets: 302 10*3/uL (ref 140–400)
RBC: 4.5 10*6/uL (ref 3.80–5.10)
RDW: 13.3 % (ref 11.0–15.0)
WBC: 10.8 10*3/uL (ref 3.8–10.8)

## 2020-08-10 LAB — VITAMIN D 25 HYDROXY (VIT D DEFICIENCY, FRACTURES): Vit D, 25-Hydroxy: 42 ng/mL (ref 30–100)

## 2020-08-10 NOTE — Assessment & Plan Note (Signed)
Elevated when last checked, will address at in office visit DASH diet and commitment to daily physical activity for a minimum of 30 minutes discussed and encouraged, as a part of hypertension management. The importance of attaining a healthy weight is also discussed.  BP/Weight 08/04/2020 08/02/2020 07/31/2020 07/06/2020 06/29/2020 06/19/2020 6/97/9480  Systolic BP 165 537 482 707 867 544 920  Diastolic BP 91 95 84 76 78 95 95  Wt. (Lbs) 129 - - 130.12 129 126 126.8  BMI 23.59 - - 23.8 23.59 23.05 23.19  Some encounter information is confidential and restricted. Go to Review Flowsheets activity to see all data.

## 2020-08-10 NOTE — Assessment & Plan Note (Signed)
Controlled, no change in medication  

## 2020-08-10 NOTE — Assessment & Plan Note (Signed)
Post covid infection cough x 1 week, short course of prednisone prescribed

## 2020-08-17 ENCOUNTER — Ambulatory Visit (INDEPENDENT_AMBULATORY_CARE_PROVIDER_SITE_OTHER): Payer: Medicare Other | Admitting: Family Medicine

## 2020-08-17 ENCOUNTER — Other Ambulatory Visit: Payer: Self-pay

## 2020-08-17 ENCOUNTER — Encounter: Payer: Self-pay | Admitting: Family Medicine

## 2020-08-17 VITALS — BP 122/84 | HR 68 | Temp 98.7°F | Resp 20 | Ht 62.0 in | Wt 130.0 lb

## 2020-08-17 DIAGNOSIS — I10 Essential (primary) hypertension: Secondary | ICD-10-CM | POA: Diagnosis not present

## 2020-08-17 DIAGNOSIS — F3341 Major depressive disorder, recurrent, in partial remission: Secondary | ICD-10-CM

## 2020-08-17 DIAGNOSIS — Z23 Encounter for immunization: Secondary | ICD-10-CM | POA: Diagnosis not present

## 2020-08-17 DIAGNOSIS — E785 Hyperlipidemia, unspecified: Secondary | ICD-10-CM | POA: Diagnosis not present

## 2020-08-17 MED ORDER — CALCIUM CARBONATE-VITAMIN D 500-400 MG-UNIT PO TABS: 1.0000 | ORAL_TABLET | Freq: Two times a day (BID) | ORAL | 5 refills | Status: DC

## 2020-08-17 NOTE — Progress Notes (Signed)
   Toni Miller     MRN: [id number]      DOB: 11-29-1960   HPI Toni Miller is here for follow up and re-evaluation of chronic medical conditions, medication management and review of any available recent lab and radiology data.  Preventive health is updated, specifically  Cancer screening and Immunization.   Recovering from Covid diagnosed on 09/24 , had infusion 09/26 / 2021, had loose stool over the past 5 days, no more than  3/ day but loose, still 3 yesterday, but getting firmer, and stomach is cramping had heartburn relieved with mylantaand tingling in her feet   ROS Denies recent fever or chills. Denies sinus pressure, nasal congestion, ear pain or sore throat. Denies chest congestion, productive cough or wheezing. Denies chest pains, palpitations and leg swelling Denies abdominal pain, nausea, vomiting,diarrhea or constipation.   Denies dysuria, frequency, hesitancy or incontinence. Denies joint pain, swelling and limitation in mobility. Denies headaches, seizures, numbness, or tingling. Denies depression, anxiety or insomnia. Denies skin break down or rash.   PE  BP 122/84 (BP Location: Right Arm, Patient Position: Sitting, Cuff Size: Normal)   Pulse 68   Temp 98.7 F (37.1 C) (Temporal)   Resp 20   Ht 5\' 2"  (1.575 m)   Wt 130 lb (59 kg)   SpO2 98%   BMI 23.78 kg/m   Patient alert and oriented and in no cardiopulmonary distress.  HEENT: No facial asymmetry, EOMI,     Neck supple .  Chest: Clear to auscultation bilaterally.  CVS: S1, S2 no murmurs, no S3.Regular rate.  ABD: Soft non tender.   Ext: No edema  MS: Adequate ROM spine, shoulders, hips and knees.  Skin: Intact, no ulcerations or rash noted.  Psych: Good eye contact, normal affect. Memory intact not anxious or depressed appearing.  CNS: CN 2-12 intact, power,  normal throughout.no focal deficits noted.   Assessment & Plan  Hyperlipidemia with target LDL less than 100 Hyperlipidemia:Low fat  diet discussed and encouraged.   Lipid Panel  Lab Results  Component Value Date   CHOL 171 08/10/2020   HDL 68 08/10/2020   LDLCALC 78 08/10/2020   TRIG 148 08/10/2020   CHOLHDL 2.5 08/10/2020       Hypertension Controlled, no change in medication DASH diet and commitment to daily physical activity for a minimum of 30 minutes discussed and encouraged, as a part of hypertension management. The importance of attaining a healthy weight is also discussed.  BP/Weight 08/17/2020 08/04/2020 08/02/2020 07/31/2020 07/06/2020 06/29/2020 04/25/5092  Systolic BP 267 124 580 998 338 250 539  Diastolic BP 84 91 95 84 76 78 95  Wt. (Lbs) 130 129 - - 130.12 129 126  BMI 23.78 23.59 - - 23.8 23.59 23.05  Some encounter information is confidential and restricted. Go to Review Flowsheets activity to see all data.       Depression, major, recurrent, in partial remission (HCC) Controlled, no change in medication Managed by Psych

## 2020-08-17 NOTE — Patient Instructions (Signed)
F/u in office with mD end February, call if ylou need me sooner  Flu vaccine today \  You may stop aspirin  Please start calcium with D 500/400 one tablet twice daily fro bones  It is important that you exercise regularly at least 30 minutes 5 times a week. If you develop chest pain, have severe difficulty breathing, or feel very tired, stop exercising immediately and seek medical attention   enjoyed in a healthy way, by EATING the fruit.  Thanks for choosing [facility], we consider it a privelige to serve you.

## 2020-08-19 ENCOUNTER — Telehealth: Payer: Self-pay | Admitting: Family Medicine

## 2020-08-19 NOTE — Telephone Encounter (Signed)
Left message

## 2020-08-19 NOTE — Telephone Encounter (Signed)
Please advise 

## 2020-08-19 NOTE — Telephone Encounter (Signed)
Otc lomotil I fine

## 2020-08-19 NOTE — Telephone Encounter (Signed)
Patient calling she states urgent care gave her Benzonatate and it has caused diarrhea is it ok if she stops this ? She states her bottom is raw from having diarrhea is there anything she should use for this ? P# [phone]

## 2020-08-20 NOTE — Telephone Encounter (Signed)
Pt informed, verbalizes understanding.

## 2020-08-22 NOTE — Assessment & Plan Note (Signed)
Controlled, no change in medication DASH diet and commitment to daily physical activity for a minimum of 30 minutes discussed and encouraged, as a part of hypertension management. The importance of attaining a healthy weight is also discussed.  BP/Weight 08/17/2020 08/04/2020 08/02/2020 07/31/2020 07/06/2020 06/29/2020 3/88/8280  Systolic BP 034 917 915 056 979 480 165  Diastolic BP 84 91 95 84 76 78 95  Wt. (Lbs) 130 129 - - 130.12 129 126  BMI 23.78 23.59 - - 23.8 23.59 23.05  Some encounter information is confidential and restricted. Go to Review Flowsheets activity to see all data.

## 2020-08-22 NOTE — Assessment & Plan Note (Signed)
Hyperlipidemia:Low fat diet discussed and encouraged.   Lipid Panel  Lab Results  Component Value Date   CHOL 171 08/10/2020   HDL 68 08/10/2020   LDLCALC 78 08/10/2020   TRIG 148 08/10/2020   CHOLHDL 2.5 08/10/2020

## 2020-08-22 NOTE — Assessment & Plan Note (Signed)
Controlled, no change in medication Managed by Psych 

## 2020-08-31 NOTE — Progress Notes (Signed)
Referring Provider: [name], MD Primary Care Physician:  [name], MD Primary GI: Dr. Abbey Chatters  Chief Complaint  Patient presents with  . Constipation    f/u. Has had diarrhea after infusion for covid. getting better    HPI:   Toni Miller is a 59 y.o. female presenting today with a history of chronic constipation, GERD, +H.pylori last year and Miller eradication documentation.    Had monoclonal infusion in September for [place]. Had diarrhea acute onset in October for 5-6 days. Now back to baseline. Had knife-like cramps with diarrhea but now resolved. Wonders if she is lactose intolerant. Takes Miralax as needed but hasn't needed recently.   Nexium once daily. Small amount of blood with wiping yesterday. Believes she might have strained. Doesn't want to pursue colonoscopy yet and wants to monitor this. Believes she has a weak bladder and will be talking to Dr. Moshe Cipro about this in the future.   Past Medical History:  Diagnosis Date  . Anxiety   . Arthritis   . Cancer ([place]) 1993   abnormal pap treated at [facility]  . Constipation   . Depression   . GERD (gastroesophageal reflux disease)   . Glaucoma 1993  . History of kidney stones   . Hot flashes, menopausal 06/12/2011  . Hyperlipidemia   . Hypertension   . Neck pain 7/09   WITH BULGING DISC-----RECIEVING EPIDURALS   . Sinusitis     Past Surgical History:  Procedure Laterality Date  . ABDOMINAL EXPLORATION SURGERY  age 57   bowel obstruction, [facility]  . ABDOMINAL HYSTERECTOMY  2007   [facility], [facility]  . BILATERAL EYE SURGERY     for glaucoma  . BIOPSY  01/09/2019   Procedure: BIOPSY;  Surgeon: Danie Binder, MD;  [facility];  Service: Endoscopy;;  gstric   . bone removed from under tongue    . BREAST BIOPSY Left    benign  . BREAST SURGERY Left 2004   left partial mastectomy, [facility]  . CATARACT EXTRACTION Right 06/04/2015  . CATARACT EXTRACTION W/PHACO  05/16/2011   Procedure: CATARACT  EXTRACTION PHACO AND INTRAOCULAR LENS PLACEMENT (IOC);  Surgeon: Tonny Branch;  Location: [facility];  Service: Ophthalmology;  Laterality: Right;  . COLONOSCOPY N/A 05/17/2013   Dr. Barnie Alderman diverticulosis was noted/small internal hemorrhoids  . CYST REMOVED LEFT BREAST /BENIGN  2005   left, [facility]  . ESOPHAGOGASTRODUODENOSCOPY N/A 01/09/2019   normal esophagus. Mild NSAID gastritis s/p biopsy. +H.pylori. treated with amoxicillin, biaxin, Nexium.  Marland Kitchen EYE SURGERY  2010, 1992 approx   bilateral  . REPAIR IMPERFORATE ANUS / ANORECTOPLASTY     per patient  . TOTAL ABDOMINAL HYSTERECTOMY W/ BILATERAL SALPINGOOPHORECTOMY  2007   [facility], [facility]  . TUBAL LIGATION  1991    Current Outpatient Medications  Medication Sig Dispense Refill  . calcium-vitamin D (OSCAL-500) 500-400 MG-UNIT tablet Take 1 tablet by mouth 2 (two) times daily. 60 tablet 5  . cetirizine (ZYRTEC ALLERGY) 10 MG tablet Take 1 tablet (10 mg total) by mouth daily. 30 tablet 0  . cholecalciferol (VITAMIN D3) 25 MCG (1000 UT) tablet Take 1,000 Units by mouth daily.    Marland Kitchen conjugated estrogens (PREMARIN) vaginal cream Apply sparingly with fingertip three times weekly 42.5 g 2  . cycloSPORINE (RESTASIS) 0.05 % ophthalmic emulsion Place 1 drop into both eyes 2 (two) times daily.    . diclofenac (VOLTAREN) 75 MG EC tablet Take 1 tablet (75 mg  total) by mouth 2 (two) times daily with a meal. 60 tablet 2  . esomeprazole (NEXIUM) 40 MG capsule TAKE ONE CAPSULE BY MOUTH TWICE DAILY. 60 capsule 5  . gabapentin (NEURONTIN) 300 MG capsule Take 1 capsule (300 mg total) by mouth 2 (two) times daily. 60 capsule 5  . montelukast (SINGULAIR) 10 MG tablet Take 1 tablet (10 mg total) by mouth daily. 90 tablet 1  . Multiple Vitamin (MULTIVITAMIN) capsule Take 1 capsule by mouth daily.    . naproxen (NAPROSYN) 500 MG tablet Take 500 mg by mouth 2 (two) times daily with a meal.    . neomycin-polymyxin-hydrocortisone (CORTISPORIN) OTIC solution Apply 1-2 drops to toe  after soaking BID 10 mL 1  . OVER THE COUNTER MEDICATION Place 1 application vaginally every other day. Replenish - Moisture    . polyethylene glycol powder (GLYCOLAX/MIRALAX) powder MIX 1 CAPFUL (17G) IN 8 OUNCES OF JUICE/WATER AND DRINK ONCE DAILY. (Patient taking differently: Take 17 g by mouth as needed for moderate constipation. MIX 1 CAPFUL (17G) IN 8 OUNCES OF JUICE/WATER AND DRINK ONCE DAILY.) 255 g 3  . rosuvastatin (CRESTOR) 10 MG tablet TAKE (1) TABLET BY MOUTH AT BEDTIME. 90 tablet 1  . TRAVATAN Z 0.004 % SOLN ophthalmic solution Place 1 drop into both eyes at bedtime.    . triamcinolone cream (KENALOG) 0.1 % Apply 1 application topically 2 (two) times daily. 30 g 0  . triamterene-hydrochlorothiazide (MAXZIDE-25) 37.5-25 MG tablet Take one and a half tablets once daily for blood pressure by mouth 135 tablet 3  . venlafaxine XR (EFFEXOR XR) 37.5 MG 24 hr capsule Take 1 capsule (37.5 mg total) by mouth daily with breakfast. 90 capsule 0  . fluticasone (FLONASE) 50 MCG/ACT nasal spray Place 1 spray into both nostrils daily for 14 days. 16 g 0   No current facility-administered medications for this visit.    Allergies as of 09/01/2020 - Review Complete 09/01/2020  Allergen Reaction Noted  . Acyclovir and related Rash 03/02/2015    Family History  Adopted: Yes  Problem Relation Age of Onset  . Alcohol abuse Mother   . Stomach cancer Mother 87  . Lung cancer Father   . Glaucoma Father   . Alcohol abuse Father   . Seizures Father   . Breast cancer Sister 39  . Breast cancer Sister 37  . Heart disease Sister   . Heart attack Sister   . Glaucoma Son   . Glaucoma Maternal Grandfather   . Cancer Maternal Grandfather        poss leukemia  . SIDS Brother   . Hyperthyroidism Daughter   . Seizures Son        as a Sport and exercise psychologist  . Kidney disease Maternal Grandmother   . Heart disease Maternal Grandmother        Psychologist, forensic  . Leukemia Maternal Grandmother   . Colon cancer Neg Hx      Social History   Socioeconomic History  . Marital status: Significant Other    Spouse name: Not on file  . Number of children: 3  . Years of education: 35  . Highest education level: 12th grade  Occupational History  . Occupation: UNEMPLOYED 2011    Comment: disability    Employer: DISABLED  Tobacco Use  . Smoking status: Former Smoker    Packs/day: 3.00    Years: 2.00    Pack years: 6.00    Types: Cigarettes    Quit date: 05/12/1991  Years since quitting: 29.3  . Smokeless tobacco: Never Used  Vaping Use  . Vaping Use: Never used  Substance and Sexual Activity  . Alcohol use: Yes    Comment: "sometimes"  . Drug use: Never  . Sexual activity: Yes    Birth control/protection: Surgical  Other Topics Concern  . Not on file  Social History Narrative   Lives 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   caffien- coffee, 1 cup daily   Social Determinants of Health   Financial Resource Strain:   . Difficulty of Paying Living Expenses: Not on file  Food Insecurity:   . Worried About Charity fundraiser in the Last Year: Not on file  . Ran Out of Food in the Last Year: Not on file  Transportation Miller:   . Lack of Transportation (Medical): Not on file  . Lack of Transportation (Non-Medical): Not on file  Physical Activity:   . Days of Exercise per Week: Not on file  . Minutes of Exercise per Session: Not on file  Stress:   . Feeling of Stress : Not on file  Social Connections:   . Frequency of Communication with Friends and Family: Not on file  . Frequency of Social Gatherings with Friends and Family: Not on file  . Attends Religious Services: Not on file  . Active Member of Clubs or Organizations: Not on file  . Attends Archivist Meetings: Not on file  . Marital Status: Not on file    Review of Systems: Gen: Denies fever, chills, anorexia. Denies fatigue, weakness, weight loss.  CV: Denies chest pain, palpitations, syncope, peripheral edema, and  claudication. Resp: Denies dyspnea at rest, cough, wheezing, coughing up blood, and pleurisy. GI: see HPI Derm: Denies rash, itching, dry skin Psych: Denies depression, anxiety, memory loss, confusion. No homicidal or suicidal ideation.  Heme: see HPI  Physical Exam: BP 120/78   Pulse 66   Temp (!) 97 F (36.1 C)   Ht 5\' 2"  (1.575 m)   Wt 129 lb 6.4 oz (58.7 kg)   BMI 23.67 kg/m  General:   Alert and oriented. No distress noted. Pleasant and cooperative.  Head:  Normocephalic and atraumatic. Eyes:  Conjuctiva clear without scleral icterus. Mouth:  Mask in place Abdomen:  +BS, soft, non-tender and non-distended. No rebound or guarding. No HSM or masses noted. Msk:  Symmetrical without gross deformities. Normal posture. Extremities:  Without edema. Neurologic:  Alert and  oriented x4 Psych:  Alert and cooperative. Normal mood and affect.  ASSESSMENT: YAZLIN EKBLAD is a 59 y.o. female presenting today with history of chronic constipation, GERD, H.pylori last year and Miller eradication documentation.  Bout with diarrhea earlier this month now resolved. Scant blood with wiping yesterday likely benign anorectal source; her last colonoscopy was in 2014. We discussed colonoscopy, but she would like to hold off and monitor for now.   Chronic GERD managed well on Nexium daily. We will need to check urea breath test and hold PPI X 14 days prior.    PLAN:  Hold Nexium starting tomorrow through 11/10. On 11/10 have urea breath test completed and resume Nexium  Monitor for any further rectal bleeding  Return in 6 months   Toni Needs, Toni Miller, [facility] Gastroenterology

## 2020-09-01 ENCOUNTER — Ambulatory Visit (INDEPENDENT_AMBULATORY_CARE_PROVIDER_SITE_OTHER): Payer: Medicare Other | Admitting: Gastroenterology

## 2020-09-01 ENCOUNTER — Other Ambulatory Visit: Payer: Self-pay

## 2020-09-01 ENCOUNTER — Encounter: Payer: Self-pay | Admitting: Gastroenterology

## 2020-09-01 VITALS — BP 120/78 | HR 66 | Temp 97.0°F | Ht 62.0 in | Wt 129.4 lb

## 2020-09-01 DIAGNOSIS — K219 Gastro-esophageal reflux disease without esophagitis: Secondary | ICD-10-CM

## 2020-09-01 DIAGNOSIS — A048 Other specified bacterial intestinal infections: Secondary | ICD-10-CM | POA: Diagnosis not present

## 2020-09-01 DIAGNOSIS — K5909 Other constipation: Secondary | ICD-10-CM

## 2020-09-01 NOTE — Patient Instructions (Addendum)
Stop Nexium tomorrow. Do not take Nexium tomorrow through November 10th. On November 10th, go to the [facility] lab beside Dr. Griffin Dakin office to do the H.pylori breath test. After the test is done, you can start taking Nexium again. We are checking to make sure that bacteria is gone.  Please call if any further blood in stool.  You can continue to use Miralax as needed for constipation.  We will see you in 6 months!   I enjoyed seeing you again today! As you know, I value our relationship and want to provide genuine, compassionate, and quality care. I welcome your feedback. If you receive a survey regarding your visit,  I greatly appreciate you taking time to fill this out. See you next time!  Annitta Needs, PhD, ANP-BC [facility] Gastroenterology

## 2020-09-01 NOTE — Progress Notes (Signed)
CC'ED TO PCP 

## 2020-09-02 ENCOUNTER — Ambulatory Visit (INDEPENDENT_AMBULATORY_CARE_PROVIDER_SITE_OTHER): Payer: Medicare Other

## 2020-09-02 ENCOUNTER — Other Ambulatory Visit: Payer: Self-pay

## 2020-09-02 VITALS — BP 124/74 | HR 73 | Temp 98.5°F | Resp 18 | Ht 62.0 in | Wt 130.0 lb

## 2020-09-02 DIAGNOSIS — Z0000 Encounter for general adult medical examination without abnormal findings: Secondary | ICD-10-CM | POA: Diagnosis not present

## 2020-09-02 NOTE — Progress Notes (Signed)
Subjective:   Toni Miller is a 59 y.o. female who presents for Medicare Annual (Subsequent) preventive examination.        Objective:    There were no vitals filed for this visit. There is no height or weight on file to calculate BMI.  Advanced Directives 06/29/2020 08/30/2019 01/09/2019 08/27/2018 02/16/2018 08/02/2017 09/08/2016  Does Patient Have a Medical Advance Directive? No No No No No No No  Would patient like information on creating a medical advance directive? - No - Patient declined Yes (MAU/Ambulatory/Procedural Areas - Information given) Yes (ED - Information included in AVS) - Yes (MAU/Ambulatory/Procedural Areas - Information given) No - patient declined information  Pre-existing out of facility DNR order (yellow form or pink MOST form) - - - - - - -    Current Medications (verified) Outpatient Encounter Medications as of 09/02/2020  Medication Sig  . calcium-vitamin D (OSCAL-500) 500-400 MG-UNIT tablet Take 1 tablet by mouth 2 (two) times daily.  . cetirizine (ZYRTEC ALLERGY) 10 MG tablet Take 1 tablet (10 mg total) by mouth daily.  . cholecalciferol (VITAMIN D3) 25 MCG (1000 UT) tablet Take 1,000 Units by mouth daily.  Marland Kitchen conjugated estrogens (PREMARIN) vaginal cream Apply sparingly with fingertip three times weekly  . cycloSPORINE (RESTASIS) 0.05 % ophthalmic emulsion Place 1 drop into both eyes 2 (two) times daily.  . diclofenac (VOLTAREN) 75 MG EC tablet Take 1 tablet (75 mg total) by mouth 2 (two) times daily with a meal.  . esomeprazole (NEXIUM) 40 MG capsule TAKE ONE CAPSULE BY MOUTH TWICE DAILY.  . fluticasone (FLONASE) 50 MCG/ACT nasal spray Place 1 spray into both nostrils daily for 14 days.  Marland Kitchen gabapentin (NEURONTIN) 300 MG capsule Take 1 capsule (300 mg total) by mouth 2 (two) times daily.  . montelukast (SINGULAIR) 10 MG tablet Take 1 tablet (10 mg total) by mouth daily.  . Multiple Vitamin (MULTIVITAMIN) capsule Take 1 capsule by mouth daily.  .  naproxen (NAPROSYN) 500 MG tablet Take 500 mg by mouth 2 (two) times daily with a meal.  . neomycin-polymyxin-hydrocortisone (CORTISPORIN) OTIC solution Apply 1-2 drops to toe after soaking BID  . OVER THE COUNTER MEDICATION Place 1 application vaginally every other day. Replenish - Moisture  . polyethylene glycol powder (GLYCOLAX/MIRALAX) powder MIX 1 CAPFUL (17G) IN 8 OUNCES OF JUICE/WATER AND DRINK ONCE DAILY. (Patient taking differently: Take 17 g by mouth as needed for moderate constipation. MIX 1 CAPFUL (17G) IN 8 OUNCES OF JUICE/WATER AND DRINK ONCE DAILY.)  . rosuvastatin (CRESTOR) 10 MG tablet TAKE (1) TABLET BY MOUTH AT BEDTIME.  . TRAVATAN Z 0.004 % SOLN ophthalmic solution Place 1 drop into both eyes at bedtime.  . triamcinolone cream (KENALOG) 0.1 % Apply 1 application topically 2 (two) times daily.  Marland Kitchen triamterene-hydrochlorothiazide (MAXZIDE-25) 37.5-25 MG tablet Take one and a half tablets once daily for blood pressure by mouth  . venlafaxine XR (EFFEXOR XR) 37.5 MG 24 hr capsule Take 1 capsule (37.5 mg total) by mouth daily with breakfast.   No facility-administered encounter medications on file as of 09/02/2020.    Allergies (verified) Acyclovir and related   History: Past Medical History:  Diagnosis Date  . Anxiety   . Arthritis   . Cancer ([place]) 1993   abnormal pap treated at [facility]  . Constipation   . Depression   . GERD (gastroesophageal reflux disease)   . Glaucoma 1993  . History of kidney stones   . Hot flashes, menopausal 06/12/2011  .  Hyperlipidemia   . Hypertension   . Neck pain 7/09   WITH BULGING DISC-----RECIEVING EPIDURALS   . Sinusitis    Past Surgical History:  Procedure Laterality Date  . ABDOMINAL EXPLORATION SURGERY  age 44   bowel obstruction, [facility]  . ABDOMINAL HYSTERECTOMY  2007   [facility], [facility]  . BILATERAL EYE SURGERY     for glaucoma  . BIOPSY  01/09/2019   Procedure: BIOPSY;  Surgeon: Danie Binder, MD;  [facility];  Service:  Endoscopy;;  gstric   . bone removed from under tongue    . BREAST BIOPSY Left    benign  . BREAST SURGERY Left 2004   left partial mastectomy, [facility]  . CATARACT EXTRACTION Right 06/04/2015  . CATARACT EXTRACTION W/PHACO  05/16/2011   Procedure: CATARACT EXTRACTION PHACO AND INTRAOCULAR LENS PLACEMENT (IOC);  Surgeon: Tonny Branch;  Location: [facility];  Service: Ophthalmology;  Laterality: Right;  . COLONOSCOPY N/A 05/17/2013   Dr. Barnie Alderman diverticulosis was noted/small internal hemorrhoids  . CYST REMOVED LEFT BREAST /BENIGN  2005   left, [facility]  . ESOPHAGOGASTRODUODENOSCOPY N/A 01/09/2019   normal esophagus. Mild NSAID gastritis s/p biopsy. +H.pylori. treated with amoxicillin, biaxin, Nexium.  Marland Kitchen EYE SURGERY  2010, 1992 approx   bilateral  . REPAIR IMPERFORATE ANUS / ANORECTOPLASTY     per patient  . TOTAL ABDOMINAL HYSTERECTOMY W/ BILATERAL SALPINGOOPHORECTOMY  2007   [facility], [facility]  . TUBAL LIGATION  1991   Family History  Adopted: Yes  Problem Relation Age of Onset  . Alcohol abuse Mother   . Stomach cancer Mother 43  . Lung cancer Father   . Glaucoma Father   . Alcohol abuse Father   . Seizures Father   . Breast cancer Sister 43  . Breast cancer Sister 39  . Heart disease Sister   . Heart attack Sister   . Glaucoma Son   . Glaucoma Maternal Grandfather   . Cancer Maternal Grandfather        poss leukemia  . SIDS Brother   . Hyperthyroidism Daughter   . Seizures Son        as a Sport and exercise psychologist  . Kidney disease Maternal Grandmother   . Heart disease Maternal Grandmother        Psychologist, forensic  . Leukemia Maternal Grandmother   . Colon cancer Neg Hx    Social History   Socioeconomic History  . Marital status: Significant Other    Spouse name: Not on file  . Number of children: 3  . Years of education: 66  . Highest education level: 12th grade  Occupational History  . Occupation: UNEMPLOYED 2011    Comment: disability    Employer: DISABLED  Tobacco Use  . Smoking status: Former  Smoker    Packs/day: 3.00    Years: 2.00    Pack years: 6.00    Types: Cigarettes    Quit date: 05/12/1991    Years since quitting: 29.3  . Smokeless tobacco: Never Used  Vaping Use  . Vaping Use: Never used  Substance and Sexual Activity  . Alcohol use: Yes    Comment: "sometimes"  . Drug use: Never  . Sexual activity: Yes    Birth control/protection: Surgical  Other Topics Concern  . Not on file  Social History Narrative   Lives 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   caffien- coffee, 1 cup daily   Social Determinants of Health   Financial Resource Strain:   . Difficulty  of Paying Living Expenses: Not on file  Food Insecurity:   . Worried About Charity fundraiser in the Last Year: Not on file  . Ran Out of Food in the Last Year: Not on file  Transportation Needs:   . Lack of Transportation (Medical): Not on file  . Lack of Transportation (Non-Medical): Not on file  Physical Activity:   . Days of Exercise per Week: Not on file  . Minutes of Exercise per Session: Not on file  Stress:   . Feeling of Stress : Not on file  Social Connections:   . Frequency of Communication with Friends and Family: Not on file  . Frequency of Social Gatherings with Friends and Family: Not on file  . Attends Religious Services: Not on file  . Active Member of Clubs or Organizations: Not on file  . Attends Archivist Meetings: Not on file  . Marital Status: Not on file    Tobacco Counseling Counseling given: Not Answered                  Diabetic? No          Activities of Daily Living No flowsheet data found.  Patient Care Team: [name], MD as PCP - [facility], Marga Melnick, MD (Inactive) as Attending Physician (Gastroenterology) Florian Buff, MD as Consulting Physician (Obstetrics and Gynecology)  Indicate any recent Medical Services you may have received from other than [facility] providers in the past year (date may be approximate).       Assessment:   This is a routine wellness examination for Shala.  Hearing/Vision screen No exam data present  Dietary issues and exercise activities discussed:    Goals    . DIET - DECREASE SODA OR JUICE INTAKE    . DIET - REDUCE SUGAR INTAKE    . Exercise 3x per week (30 min per time)     Recommend starting a routine exercise program at least 3 days a week for 30-45 minutes at a time as tolerated.        Depression Screen PHQ 2/9 Scores 08/17/2020 08/17/2020 08/04/2020 07/06/2020 06/19/2020 03/19/2020 01/20/2020  PHQ - 2 Score 0 1 2 0 0 4 0  PHQ- 9 Score 2 8 5  - 0 13 -    Fall Risk Fall Risk  08/17/2020 08/17/2020 08/04/2020 07/06/2020 06/19/2020  Falls in the past year? 1 1 0 0 0  Number falls in past yr: 0 0 - - 0  Injury with Fall? 0 0 - - 0  Risk for fall due to : - History of fall(s) - - No Fall Risks  Follow up Falls evaluation completed Falls evaluation completed;Education provided;Falls prevention discussed - - Falls evaluation completed    Any stairs in or around the home? Yes  If so, are there any without handrails? Yes  Home free of loose throw rugs in walkways, pet beds, electrical cords, etc? No  Adequate lighting in your home to reduce risk of falls? Yes   ASSISTIVE DEVICES UTILIZED TO PREVENT FALLS:  Life alert? No  Use of a cane, walker or w/c? No  Grab bars in the bathroom? No  Shower chair or bench in shower? No  Elevated toilet seat or a handicapped toilet? No   TIMED UP AND GO:  Was the test performed? No .     Cognitive Function: MMSE - Mini Mental State Exam 10/08/2018 08/28/2018  Orientation to time 5 4  Orientation to Place  4 5  Registration 3 3  Attention/ Calculation 0 0  Recall 2 3  Language- name 2 objects 2 2  Language- repeat 0 1  Language- follow 3 step command 3 3  Language- read & follow direction 1 1  Write a sentence 1 0  Copy design 0 0  Total score 21 22     6CIT Screen 08/30/2019 08/27/2018 08/02/2017  What Year? 0  points 0 points 0 points  What month? 0 points 0 points 0 points  What time? 0 points 0 points 0 points  Count back from 20 0 points 2 points 0 points  Months in reverse 0 points 4 points 0 points  Repeat phrase 4 points 4 points 0 points  Total Score 4 10 0    Immunizations Immunization History  Administered Date(s) Administered  . Influenza Split 07/27/2011, 07/26/2012, 08/16/2016  . Influenza Whole 01/08/2008, 08/07/2008  . Influenza,inj,Quad PF,6+ Mos 08/21/2013, 08/11/2014, 07/28/2015, 07/27/2018, 08/07/2019, 08/17/2020  . Moderna SARS-COVID-2 Vaccination 01/31/2020, 03/06/2020  . Td 05/25/2004  . Tdap 02/24/2015  . Zoster Recombinat (Shingrix) 02/20/2018    TDAP status: Up to date Flu Vaccine status: Up to date Pneumococcal vaccine status: Declined,  Education has been provided regarding the importance of this vaccine but patient still declined. Advised may receive this vaccine at local pharmacy or Health Dept. Aware to provide a copy of the vaccination record if obtained from local pharmacy or Health Dept. Verbalized acceptance and understanding.  Covid-19 vaccine status: Completed vaccines  Qualifies for Shingles Vaccine? Yes   Zostavax completed No   Shingrix Completed?: No.    Education has been provided regarding the importance of this vaccine. Patient has been advised to call insurance company to determine out of pocket expense if they have not yet received this vaccine. Advised may also receive vaccine at local pharmacy or Health Dept. Verbalized acceptance and understanding.  Screening Tests Health Maintenance  Topic Date Due  . MAMMOGRAM  07/21/2022  . PAP SMEAR-Modifier  08/06/2022  . COLONOSCOPY  05/18/2023  . TETANUS/TDAP  02/23/2025  . INFLUENZA VACCINE  Completed  . COVID-19 Vaccine  Completed  . Hepatitis C Screening  Completed  . HIV Screening  Completed    Health Maintenance  There are no preventive care reminders to display for this  patient.  Colorectal cancer screening: Completed normal. Repeat every 10 years Mammogram status: Completed normal. Repeat every year Bone Density: Completed  Lung Cancer Screening: (Low Dose CT Chest recommended if Age 27-80 years, 30 pack-year currently smoking OR have quit w/in 15years.) does qualify.    Additional Screening:  Hepatitis C Screening: does qualify; Completed   Vision Screening: Recommended annual ophthalmology exams for early detection of glaucoma and other disorders of the eye. Is the patient up to date with their annual eye exam?  Yes    Who is the provider or what is the name of the office in which the patient attends annual eye exams? Dr. Jorja Loa  If pt is not established with a provider, would they like to be referred to a provider to establish care? No .   Dental Screening: Recommended annual dental exams for proper oral hygiene  Community Resource Referral / Chronic Care Management: CRR required this visit?  No   CCM required this visit?  No      Plan:     I have personally reviewed and noted the following in the patient's chart:   . Medical and social history . Use of alcohol,  tobacco or illicit drugs  . Current medications and supplements . Functional ability and status . Nutritional status . Physical activity . Advanced directives . List of other physicians . Hospitalizations, surgeries, and ER visits in previous 12 months . Vitals . Screenings to include cognitive, depression, and falls . Referrals and appointments  In addition, I have reviewed and discussed with patient certain preventive protocols, quality metrics, and best practice recommendations. A written personalized care plan for preventive services as well as general preventive health recommendations were provided to patient.     Lonn Georgia, LPN   [id number]   Nurse Notes: AWV conducted face to face in the office. Time spent, approx 30 min. Pt verbalized understanding of  health maintenace and care gaps. She would like information about a Prevnar immunization and Shingrix. Provider available in the office. Pt verbalizes understanding of annual wellness visit.

## 2020-09-02 NOTE — Patient Instructions (Signed)
Toni Miller , Thank you for taking time to come for your Medicare Wellness Visit. I appreciate your ongoing commitment to your health goals. Please review the following plan we discussed and let me know if I can assist you in the future.   Screening recommendations/referrals: Colonoscopy: 05/18/2023 Mammogram: 07/31/2022 Bone Density: COMPLETED  Recommended yearly ophthalmology/optometry visit for glaucoma screening and checkup Recommended yearly dental visit for hygiene and checkup  Vaccinations: Influenza vaccine: COMPLETE Pneumococcal vaccine: EDUCATION PROVIDED Tdap vaccine: DUE 02/23/2025 Shingles vaccine: EDUCATION PROVIDED     Advanced directives: N/A   Conditions/risks identified: N/A   Next appointment: 12/30/2021 @ 2:00 WITH DR. Moshe Cipro   Preventive Care 40-64 Years, Female Preventive care refers to lifestyle choices and visits with your health care provider that can promote health and wellness. What does preventive care include?  A yearly physical exam. This is also called an annual well check.  Dental exams once or twice a year.  Routine eye exams. Ask your health care provider how often you should have your eyes checked.  Personal lifestyle choices, including:  Daily care of your teeth and gums.  Regular physical activity.  Eating a healthy diet.  Avoiding tobacco and drug use.  Limiting alcohol use.  Practicing safe sex.  Taking low-dose aspirin daily starting at age 34.  Taking vitamin and mineral supplements as recommended by your health care provider. What happens during an annual well check? The services and screenings done by your health care provider during your annual well check will depend on your age, overall health, lifestyle risk factors, and family history of disease. Counseling  Your health care provider may ask you questions about your:  Alcohol use.  Tobacco use.  Drug use.  Emotional well-being.  Home and relationship  well-being.  Sexual activity.  Eating habits.  Work and work Statistician.  Method of birth control.  Menstrual cycle.  Pregnancy history. Screening  You may have the following tests or measurements:  Height, weight, and BMI.  Blood pressure.  Lipid and cholesterol levels. These may be checked every 5 years, or more frequently if you are over 23 years old.  Skin check.  Lung cancer screening. You may have this screening every year starting at age 2 if you have a 30-pack-year history of smoking and currently smoke or have quit within the past 15 years.  Fecal occult blood test (FOBT) of the stool. You may have this test every year starting at age 38.  Flexible sigmoidoscopy or colonoscopy. You may have a sigmoidoscopy every 5 years or a colonoscopy every 10 years starting at age 44.  Hepatitis C blood test.  Hepatitis B blood test.  Sexually transmitted disease (STD) testing.  Diabetes screening. This is done by checking your blood sugar (glucose) after you have not eaten for a while (fasting). You may have this done every 1-3 years.  Mammogram. This may be done every 1-2 years. Talk to your health care provider about when you should start having regular mammograms. This may depend on whether you have a family history of breast cancer.  BRCA-related cancer screening. This may be done if you have a family history of breast, ovarian, tubal, or peritoneal cancers.  Pelvic exam and Pap test. This may be done every 3 years starting at age 57. Starting at age 74, this may be done every 5 years if you have a Pap test in combination with an HPV test.  Bone density scan. This is done to screen for osteoporosis.  You may have this scan if you are at high risk for osteoporosis. Discuss your test results, treatment options, and if necessary, the need for more tests with your health care provider. Vaccines  Your health care provider may recommend certain vaccines, such  as:  Influenza vaccine. This is recommended every year.  Tetanus, diphtheria, and acellular pertussis (Tdap, Td) vaccine. You may need a Td booster every 10 years.  Zoster vaccine. You may need this after age 38.  Pneumococcal 13-valent conjugate (PCV13) vaccine. You may need this if you have certain conditions and were not previously vaccinated.  Pneumococcal polysaccharide (PPSV23) vaccine. You may need one or two doses if you smoke cigarettes or if you have certain conditions. Talk to your health care provider about which screenings and vaccines you need and how often you need them. This information is not intended to replace advice given to you by your health care provider. Make sure you discuss any questions you have with your health care provider. Document Released: 11/20/2015 Document Revised: 07/13/2016 Document Reviewed: 08/25/2015 Elsevier Interactive Patient Education  2017 [place] Prevention in the Home Falls can cause injuries. They can happen to people of all ages. There are many things you can do to make your home safe and to help prevent falls. What can I do on the outside of my home?  Regularly fix the edges of walkways and driveways and fix any cracks.  Remove anything that might make you trip as you walk through a door, such as a raised step or threshold.  Trim any bushes or trees on the path to your home.  Use bright outdoor lighting.  Clear any walking paths of anything that might make someone trip, such as rocks or tools.  Regularly check to see if handrails are loose or broken. Make sure that both sides of any steps have handrails.  Any raised decks and porches should have guardrails on the edges.  Have any leaves, snow, or ice cleared regularly.  Use sand or salt on walking paths during winter.  Clean up any spills in your garage right away. This includes oil or grease spills. What can I do in the bathroom?  Use night  lights.  Install grab bars by the toilet and in the tub and shower. Do not use towel bars as grab bars.  Use non-skid mats or decals in the tub or shower.  If you need to sit down in the shower, use a plastic, non-slip stool.  Keep the floor dry. Clean up any water that spills on the floor as soon as it happens.  Remove soap buildup in the tub or shower regularly.  Attach bath mats securely with double-sided non-slip rug tape.  Do not have throw rugs and other things on the floor that can make you trip. What can I do in the bedroom?  Use night lights.  Make sure that you have a light by your bed that is easy to reach.  Do not use any sheets or blankets that are too big for your bed. They should not hang down onto the floor.  Have a firm chair that has side arms. You can use this for support while you get dressed.  Do not have throw rugs and other things on the floor that can make you trip. What can I do in the kitchen?  Clean up any spills right away.  Avoid walking on wet floors.  Keep items that you use a lot  in easy-to-reach places.  If you need to reach something above you, use a strong step stool that has a grab bar.  Keep electrical cords out of the way.  Do not use floor polish or wax that makes floors slippery. If you must use wax, use non-skid floor wax.  Do not have throw rugs and other things on the floor that can make you trip. What can I do with my stairs?  Do not leave any items on the stairs.  Make sure that there are handrails on both sides of the stairs and use them. Fix handrails that are broken or loose. Make sure that handrails are as long as the stairways.  Check any carpeting to make sure that it is firmly attached to the stairs. Fix any carpet that is loose or worn.  Avoid having throw rugs at the top or bottom of the stairs. If you do have throw rugs, attach them to the floor with carpet tape.  Make sure that you have a light switch at the  top of the stairs and the bottom of the stairs. If you do not have them, ask someone to add them for you. What else can I do to help prevent falls?  Wear shoes that:  Do not have high heels.  Have rubber bottoms.  Are comfortable and fit you well.  Are closed at the toe. Do not wear sandals.  If you use a stepladder:  Make sure that it is fully opened. Do not climb a closed stepladder.  Make sure that both sides of the stepladder are locked into place.  Ask someone to hold it for you, if possible.  Clearly mark and make sure that you can see:  Any grab bars or handrails.  First and last steps.  Where the edge of each step is.  Use tools that help you move around (mobility aids) if they are needed. These include:  Canes.  Walkers.  Scooters.  Crutches.  Turn on the lights when you go into a dark area. Replace any light bulbs as soon as they burn out.  Set up your furniture so you have a clear path. Avoid moving your furniture around.  If any of your floors are uneven, fix them.  If there are any pets around you, be aware of where they are.  Review your medicines with your doctor. Some medicines can make you feel dizzy. This can increase your chance of falling. Ask your doctor what other things that you can do to help prevent falls. This information is not intended to replace advice given to you by your health care provider. Make sure you discuss any questions you have with your health care provider. Document Released: 08/20/2009 Document Revised: 03/31/2016 Document Reviewed: 11/28/2014 Elsevier Interactive Patient Education  2017 [employer].

## 2020-09-03 ENCOUNTER — Telehealth: Payer: Self-pay

## 2020-09-03 NOTE — Telephone Encounter (Signed)
Pt called and is scheduled to have her H. Pylori breath test on 09/16/20. Pt is holding her PPI and wants to know what she can take while she is awaiting breath test. Pt  is having burning in her throat and gas. Pt is avoid foods that cause reflux.

## 2020-09-04 NOTE — Telephone Encounter (Signed)
Spoke with pt. Pt was advised to try Pepcid, don't take any PPI , pepto or antibiotics.

## 2020-09-04 NOTE — Telephone Encounter (Signed)
She may use pepcid OTC sparingly. Do not use any PPI or Pepto. Avoid antibiotics as well.

## 2020-09-16 DIAGNOSIS — A048 Other specified bacterial intestinal infections: Secondary | ICD-10-CM | POA: Diagnosis not present

## 2020-09-17 ENCOUNTER — Telehealth: Payer: Self-pay

## 2020-09-17 LAB — H. PYLORI BREATH TEST: H. pylori Breath Test: NOT DETECTED

## 2020-09-17 NOTE — Telephone Encounter (Signed)
Patient called in wanting to know if she was able to get the Covid booster since she had her flu vaccine in October. Advised patient she could get her booster with verbal understanding

## 2020-10-02 ENCOUNTER — Other Ambulatory Visit: Payer: Self-pay | Admitting: Family Medicine
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
## 2020-10-21 ENCOUNTER — Telehealth (HOSPITAL_COMMUNITY): Payer: Medicare Other | Admitting: Psychiatry

## 2020-10-21 ENCOUNTER — Telehealth: Payer: Medicare Other | Admitting: Psychiatry

## 2020-12-01 ENCOUNTER — Other Ambulatory Visit: Payer: Self-pay

## 2020-12-01 ENCOUNTER — Telehealth: Payer: Self-pay

## 2020-12-01 MED ORDER — VENLAFAXINE HCL ER 37.5 MG PO CP24: 37.5000 mg | ORAL_CAPSULE | Freq: Every day | ORAL | 0 refills | Status: DC

## 2020-12-01 NOTE — Telephone Encounter (Signed)
Patient needing a refill on anti-depressant medication p# [phone]

## 2020-12-01 NOTE — Telephone Encounter (Signed)
Refill sent.

## 2020-12-30 ENCOUNTER — Telehealth: Payer: Self-pay

## 2020-12-30 ENCOUNTER — Ambulatory Visit: Payer: Medicare Other | Admitting: Family Medicine

## 2020-12-30 NOTE — Telephone Encounter (Signed)
error 

## 2021-01-04 ENCOUNTER — Telehealth: Payer: Medicare Other | Admitting: Family Medicine

## 2021-01-04 ENCOUNTER — Encounter: Payer: Self-pay | Admitting: Internal Medicine

## 2021-01-04 ENCOUNTER — Other Ambulatory Visit: Payer: Self-pay

## 2021-01-04 ENCOUNTER — Ambulatory Visit (INDEPENDENT_AMBULATORY_CARE_PROVIDER_SITE_OTHER): Payer: Medicare Other | Admitting: Internal Medicine

## 2021-01-04 ENCOUNTER — Ambulatory Visit: Payer: Medicare Other | Admitting: Nurse Practitioner

## 2021-01-04 VITALS — BP 106/74 | HR 85 | Temp 99.2°F | Resp 18 | Ht 61.0 in | Wt 128.1 lb

## 2021-01-04 DIAGNOSIS — M5442 Lumbago with sciatica, left side: Secondary | ICD-10-CM

## 2021-01-04 DIAGNOSIS — G8929 Other chronic pain: Secondary | ICD-10-CM

## 2021-01-04 DIAGNOSIS — E785 Hyperlipidemia, unspecified: Secondary | ICD-10-CM | POA: Diagnosis not present

## 2021-01-04 DIAGNOSIS — I10 Essential (primary) hypertension: Secondary | ICD-10-CM | POA: Diagnosis not present

## 2021-01-04 DIAGNOSIS — R682 Dry mouth, unspecified: Secondary | ICD-10-CM | POA: Diagnosis not present

## 2021-01-04 DIAGNOSIS — K219 Gastro-esophageal reflux disease without esophagitis: Secondary | ICD-10-CM | POA: Diagnosis not present

## 2021-01-04 DIAGNOSIS — F3341 Major depressive disorder, recurrent, in partial remission: Secondary | ICD-10-CM | POA: Diagnosis not present

## 2021-01-04 MED ORDER — CYCLOBENZAPRINE HCL 5 MG PO TABS: 5.0000 mg | ORAL_TABLET | Freq: Two times a day (BID) | ORAL | 1 refills | Status: DC | PRN

## 2021-01-04 NOTE — Assessment & Plan Note (Signed)
On Crestor Check lipid profile 

## 2021-01-04 NOTE — Assessment & Plan Note (Signed)
Controlled, no change in medication Managed by Psych 

## 2021-01-04 NOTE — Assessment & Plan Note (Signed)
On Nexium 

## 2021-01-04 NOTE — Assessment & Plan Note (Signed)
Chronic low back pain Muscle spasms at times, prescribed Flexeril PRN Continue Gabapentin and Naproxen Simple back exercises

## 2021-01-04 NOTE — Assessment & Plan Note (Signed)
Could be related to excision of oral cavity mass and/or Effexor Advised to increase water intake for now If persistent symptoms, switch to HRT instead of Effexor for hot flashes

## 2021-01-04 NOTE — Assessment & Plan Note (Signed)
BP Readings from Last 1 Encounters:  01/04/21 106/74   Well-controlled Counseled for compliance with the medications Advised DASH diet and moderate exercise/walking, at least 150 mins/week

## 2021-01-04 NOTE — Patient Instructions (Addendum)
Please start taking Flexeril as prescribed for muscle spasms.  Please take Gabapentin twice daily as prescribed.  Please increase water intake to at least 60 ounces in a day to avoid dry mouth or dehydration.  Please get fasting blood tests done before the next visit.

## 2021-01-04 NOTE — Progress Notes (Signed)
Established Patient Office Visit  Subjective:  Patient ID: Toni Miller, female    DOB: Aug 23, 1961  Age: 60 y.o. MRN: [id number]  CC:  Chief Complaint  Patient presents with  . Follow-up    Follow up hurting in lower back since 12-28-20 dull pain comes and goes     HPI Toni Miller presents for follow up of her chronic medical conditions.  Back pain: Chronic, constant, nonradiating, in thoracic and lumbar area. Takes Gabapentin, but only QD - has been prescribed BID. Also takes Naproxen PRN. C/o muscle spasms at times.  Dry mouth: C/o dry mouth since excision of oral cavity mass. 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 Also on Effexor for hot flashes, which could contribute to dry mouth.  HTN: BP is well-controlled. Takes medications regularly. Patient denies headache, dizziness, chest pain, dyspnea or palpitations.  GERD: Takes Nexium. Denies dysphagia or odynophagia.   Past Medical History:  Diagnosis Date  . Anxiety   . Arthritis   . Cancer ([place]) 1993   abnormal pap [facility]  . Constipation   . Depression   . GERD (gastroesophageal reflux disease)   . Glaucoma 1993  . History of kidney stones   . Hot flashes, menopausal 06/12/2011  . Hyperlipidemia   . Hypertension   . Neck pain 7/09   WITH BULGING DISC-----RECIEVING EPIDURALS   . Sinusitis     Past Surgical History:  Procedure Laterality Date  . ABDOMINAL EXPLORATION SURGERY  age 28   bowel obstruction, [facility]  . ABDOMINAL HYSTERECTOMY  2007   [facility], [facility]  . BILATERAL EYE SURGERY     for glaucoma  . BIOPSY  01/09/2019   Procedure: BIOPSY;  Surgeon: Danie Binder, MD;  Location: [facility] SUITE;  Service: Endoscopy;;  gstric   . bone removed from under tongue    . BREAST BIOPSY Left    benign  . BREAST SURGERY Left 2004   left partial mastectomy, [facility]  . CATARACT EXTRACTION Right 06/04/2015  . CATARACT EXTRACTION W/PHACO  05/16/2011   Procedure: CATARACT EXTRACTION PHACO AND  INTRAOCULAR LENS PLACEMENT (IOC);  Surgeon: Tonny Branch;  Location: [facility] ORS;  Service: Ophthalmology;  Laterality: Right;  . COLONOSCOPY N/A 05/17/2013   Dr. Barnie Alderman diverticulosis was noted/small internal hemorrhoids  . CYST REMOVED LEFT BREAST /BENIGN  2005   left, [facility]  . ESOPHAGOGASTRODUODENOSCOPY N/A 01/09/2019   normal esophagus. Mild NSAID gastritis s/p biopsy. +H.pylori. treated with amoxicillin, biaxin, Nexium.  Marland Kitchen EYE SURGERY  2010, 1992 approx   bilateral  . REPAIR IMPERFORATE ANUS / ANORECTOPLASTY     per patient  . TOTAL ABDOMINAL HYSTERECTOMY W/ BILATERAL SALPINGOOPHORECTOMY  2007   [facility], [place]  . TUBAL LIGATION  1991    Family History  Adopted: Yes  Problem Relation Age of Onset  . Alcohol abuse Mother   . Stomach cancer Mother 37  . Lung cancer Father   . Glaucoma Father   . Alcohol abuse Father   . Seizures Father   . Breast cancer Sister 25  . Breast cancer Sister 31  . Heart disease Sister   . Heart attack Sister   . Glaucoma Son   . Glaucoma Maternal Grandfather   . Cancer Maternal Grandfather        poss leukemia  . SIDS Brother   . Hyperthyroidism Daughter   . Seizures Son        as a Sport and exercise psychologist  . Kidney disease Maternal Grandmother   .  Heart disease Maternal Grandmother        Psychologist, forensic  . Leukemia Maternal Grandmother   . Colon cancer Neg Hx     Social History   Socioeconomic History  . Marital status: Significant Other    Spouse name: Not on file  . Number of children: 3  . Years of education: 59  . Highest education level: 12th grade  Occupational History  . Occupation: UNEMPLOYED 2011    Comment: disability    Employer: DISABLED  Tobacco Use  . Smoking status: Former Smoker    Packs/day: 3.00    Years: 2.00    Pack years: 6.00    Types: Cigarettes    Quit date: 05/12/1991    Years since quitting: 29.6  . Smokeless tobacco: Never Used  Vaping Use  . Vaping Use: Never used  Substance and Sexual Activity  . Alcohol use: Yes    Comment:  "sometimes"  . Drug use: Never  . Sexual activity: Yes    Birth control/protection: Surgical  Other Topics Concern  . Not on file  Social History Narrative   Lives 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   caffien- coffee, 1 cup daily   Social Determinants of Health   Financial Resource Strain: Low Risk   . Difficulty of Paying Living Expenses: Not very hard  Food Insecurity: No Food Insecurity  . Worried About Charity fundraiser in the Last Year: Never true  . Ran Out of Food in the Last Year: Never true  Transportation Needs: No Transportation Needs  . Lack of Transportation (Medical): No  . Lack of Transportation (Non-Medical): No  Physical Activity: Insufficiently Active  . Days of Exercise per Week: 1 day  . Minutes of Exercise per Session: 30 min  Stress: No Stress Concern Present  . Feeling of Stress : Only a little  Social Connections: Moderately Isolated  . Frequency of Communication with Friends and Family: Three times a week  . Frequency of Social Gatherings with Friends and Family: Never  . Attends Religious Services: More than 4 times per year  . Active Member of Clubs or Organizations: No  . Attends Archivist Meetings: Never  . Marital Status: Divorced  Human resources officer Violence: Not At Risk  . Fear of Current or Ex-Partner: No  . Emotionally Abused: No  . Physically Abused: No  . Sexually Abused: No    Outpatient Medications Prior to Visit  Medication Sig Dispense Refill  . calcium-vitamin D (OSCAL-500) 500-400 MG-UNIT tablet Take 1 tablet by mouth 2 (two) times daily. 60 tablet 5  . cetirizine (ZYRTEC ALLERGY) 10 MG tablet Take 1 tablet (10 mg total) by mouth daily. 30 tablet 0  . cholecalciferol (VITAMIN D3) 25 MCG (1000 UT) tablet Take 1,000 Units by mouth daily.    Marland Kitchen conjugated estrogens (PREMARIN) vaginal cream Apply sparingly with fingertip three times weekly 42.5 g 2  . cycloSPORINE (RESTASIS) 0.05 % ophthalmic emulsion Place 1 drop into  both eyes 2 (two) times daily.    Marland Kitchen esomeprazole (NEXIUM) 40 MG capsule TAKE ONE CAPSULE BY MOUTH TWICE DAILY. 60 capsule 5  . gabapentin (NEURONTIN) 300 MG capsule Take 1 capsule (300 mg total) by mouth 2 (two) times daily. 60 capsule 5  . montelukast (SINGULAIR) 10 MG tablet Take 1 tablet (10 mg total) by mouth daily. 90 tablet 1  . Multiple Vitamin (MULTIVITAMIN) capsule Take 1 capsule by mouth daily.    . naproxen (NAPROSYN) 500 MG tablet  Take 500 mg by mouth 2 (two) times daily with a meal.    . neomycin-polymyxin-hydrocortisone (CORTISPORIN) OTIC solution Apply 1-2 drops to toe after soaking BID 10 mL 1  . OVER THE COUNTER MEDICATION Place 1 application vaginally every other day. Replenish - Moisture    . polyethylene glycol powder (GLYCOLAX/MIRALAX) powder MIX 1 CAPFUL (17G) IN 8 OUNCES OF JUICE/WATER AND DRINK ONCE DAILY. (Patient taking differently: Take 17 g by mouth as needed for moderate constipation. MIX 1 CAPFUL (17G) IN 8 OUNCES OF JUICE/WATER AND DRINK ONCE DAILY.) 255 g 3  . rosuvastatin (CRESTOR) 10 MG tablet TAKE (1) TABLET BY MOUTH AT BEDTIME. 90 tablet 0  . TRAVATAN Z 0.004 % SOLN ophthalmic solution Place 1 drop into both eyes at bedtime.    . triamcinolone cream (KENALOG) 0.1 % Apply 1 application topically 2 (two) times daily. 30 g 0  . triamterene-hydrochlorothiazide (MAXZIDE-25) 37.5-25 MG tablet TAKE 1 & 1/2 TABLETS DAILY FOR BLOOD PRESSURE. 135 tablet 0  . venlafaxine XR (EFFEXOR XR) 37.5 MG 24 hr capsule Take 1 capsule (37.5 mg total) by mouth daily with breakfast. 90 capsule 0  . diclofenac (VOLTAREN) 75 MG EC tablet Take 1 tablet (75 mg total) by mouth 2 (two) times daily with a meal. 60 tablet 2  . fluticasone (FLONASE) 50 MCG/ACT nasal spray Place 1 spray into both nostrils daily for 14 days. 16 g 0  . hydrOXYzine (ATARAX/VISTARIL) 10 MG tablet Take 10 mg by mouth daily as needed.     No facility-administered medications prior to visit.    Allergies  Allergen  Reactions  . Acyclovir And Related Rash    ROS Review of Systems  Constitutional: Negative for chills and fever.  HENT: Negative for congestion, sinus pressure, sinus pain and sore throat.   Eyes: Negative for pain and discharge.  Respiratory: Negative for cough and shortness of breath.   Cardiovascular: Negative for chest pain and palpitations.  Gastrointestinal: Positive for constipation. Negative for abdominal pain, diarrhea, nausea and vomiting.  Endocrine: Negative for polydipsia and polyuria.  Genitourinary: Negative for dysuria and hematuria.  Musculoskeletal: Positive for back pain. Negative for neck pain and neck stiffness.  Skin: Negative for rash.  Neurological: Negative for dizziness and weakness.  Psychiatric/Behavioral: Positive for dysphoric mood. Negative for agitation, behavioral problems and suicidal ideas. The patient is nervous/anxious.       Objective:    Physical Exam Vitals reviewed.  Constitutional:      General: She is not in acute distress.    Appearance: She is not diaphoretic.  HENT:     Head: Normocephalic and atraumatic.     Nose: Nose normal. No congestion.     Mouth/Throat:     Mouth: Mucous membranes are dry.     Pharynx: No posterior oropharyngeal erythema.  Eyes:     General: No scleral icterus.    Extraocular Movements: Extraocular movements intact.     Pupils: Pupils are equal, round, and reactive to light.  Cardiovascular:     Rate and Rhythm: Normal rate and regular rhythm.     Pulses: Normal pulses.     Heart sounds: Normal heart sounds. No murmur heard.   Pulmonary:     Breath sounds: Normal breath sounds. No wheezing or rales.  Abdominal:     Palpations: Abdomen is soft.     Tenderness: There is no abdominal tenderness.  Musculoskeletal:        General: Tenderness (Thoracic and lumbar spine area, right scapular  area) present.     Cervical back: Neck supple. No tenderness.     Right lower leg: No edema.     Left lower leg:  No edema.  Skin:    General: Skin is warm.     Findings: No rash.  Neurological:     General: No focal deficit present.     Mental Status: She is alert and oriented to person, place, and time.     Sensory: No sensory deficit.     Motor: No weakness.  Psychiatric:        Mood and Affect: Mood normal.        Behavior: Behavior normal.     BP 106/74 (BP Location: Right Arm, Patient Position: Sitting, Cuff Size: Normal)   Pulse 85   Temp 99.2 F (37.3 C) (Oral)   Resp 18   Ht '5\' 1"'  (1.549 m)   Wt 128 lb 1.9 oz (58.1 kg)   SpO2 97%   BMI 24.21 kg/m  Wt Readings from Last 3 Encounters:  01/04/21 128 lb 1.9 oz (58.1 kg)  09/02/20 130 lb (59 kg)  09/01/20 129 lb 6.4 oz (58.7 kg)     Health Maintenance Due  Topic Date Due  . COVID-19 Vaccine (3 - Booster for Moderna series) 09/05/2020    There are no preventive care reminders to display for this patient.  Lab Results  Component Value Date   TSH 2.84 08/10/2020   Lab Results  Component Value Date   WBC 10.8 08/10/2020   HGB 13.5 08/10/2020   HCT 40.0 08/10/2020   MCV 88.9 08/10/2020   PLT 302 08/10/2020   Lab Results  Component Value Date   NA 140 08/10/2020   K 4.1 08/10/2020   CO2 33 (H) 08/10/2020   GLUCOSE 72 08/10/2020   BUN 27 (H) 08/10/2020   CREATININE 0.83 08/10/2020   BILITOT 0.4 08/10/2020   ALKPHOS 62 06/29/2020   AST 14 08/10/2020   ALT 25 08/10/2020   PROT 6.8 08/10/2020   ALBUMIN 4.3 06/29/2020   CALCIUM 9.4 08/10/2020   ANIONGAP 9 06/29/2020   Lab Results  Component Value Date   CHOL 171 08/10/2020   Lab Results  Component Value Date   HDL 68 08/10/2020   Lab Results  Component Value Date   LDLCALC 78 08/10/2020   Lab Results  Component Value Date   TRIG 148 08/10/2020   Lab Results  Component Value Date   CHOLHDL 2.5 08/10/2020   No results found for: HGBA1C    Assessment & Plan:   Problem List Items Addressed This Visit      Cardiovascular and Mediastinum    Hypertension    BP Readings from Last 1 Encounters:  01/04/21 106/74   Well-controlled Counseled for compliance with the medications Advised DASH diet and moderate exercise/walking, at least 150 mins/week       Relevant Orders   CBC   CMP14+EGFR   Hemoglobin A1c     Digestive   GERD    On Nexium        Nervous and Auditory   Low back pain with left-sided sciatica - Primary    Chronic low back pain Muscle spasms at times, prescribed Flexeril PRN Continue Gabapentin and Naproxen Simple back exercises      Relevant Medications   hydrOXYzine (ATARAX/VISTARIL) 10 MG tablet   cyclobenzaprine (FLEXERIL) 5 MG tablet     Other   Hyperlipidemia with target LDL less than 100    On  Crestor Check lipid profile      Relevant Orders   Lipid panel   Depression, major, recurrent, in partial remission (HCC)    Controlled, no change in medication Managed by Psych      Relevant Medications   hydrOXYzine (ATARAX/VISTARIL) 10 MG tablet      Meds ordered this encounter  Medications  . cyclobenzaprine (FLEXERIL) 5 MG tablet    Sig: Take 1 tablet (5 mg total) by mouth 2 (two) times daily as needed for muscle spasms.    Dispense:  30 tablet    Refill:  1    Follow-up: Return in about 4 months (around 05/04/2021).    Lindell Spar, MD

## 2021-01-08 ENCOUNTER — Other Ambulatory Visit: Payer: Self-pay | Admitting: Gastroenterology

## 2021-01-11 ENCOUNTER — Telehealth: Payer: Self-pay | Admitting: Internal Medicine

## 2021-01-11 NOTE — Telephone Encounter (Signed)
Pt was calling to see if Toni Miller ever sent over her refill request for [employer]. Please advise. [phone]

## 2021-01-12 ENCOUNTER — Telehealth: Payer: Self-pay | Admitting: Internal Medicine

## 2021-01-12 MED ORDER — ESOMEPRAZOLE MAGNESIUM 40 MG PO CPDR: DELAYED_RELEASE_CAPSULE | ORAL | 5 refills | Status: DC

## 2021-01-12 NOTE — Telephone Encounter (Signed)
This pt is calling back stating the pharmacy never got her Rx Esomeprazole Mag Dr 40 mg cap. Please advise this pt has phoned x 2

## 2021-01-12 NOTE — Telephone Encounter (Signed)
Phoned and advised the pt that her refill has been called in.

## 2021-01-12 NOTE — Addendum Note (Signed)
Addended by: Annitta Needs on: 01/12/2021 01:21 PM   Modules accepted: Orders

## 2021-01-12 NOTE — Telephone Encounter (Signed)
Pt called again today to say that [facility] was waiting for [place] to send her refill of Omeprazole. Please advise. [phone]

## 2021-01-12 NOTE — Telephone Encounter (Signed)
I have refilled this. I am sorry for any delay. Any refills sent on Friday after the office closed aren't addressed till the next business day.

## 2021-02-09 ENCOUNTER — Other Ambulatory Visit: Payer: Self-pay | Admitting: Family Medicine

## 2021-02-16 ENCOUNTER — Other Ambulatory Visit: Payer: Self-pay

## 2021-02-16 ENCOUNTER — Encounter: Payer: Self-pay | Admitting: Family Medicine

## 2021-02-16 ENCOUNTER — Ambulatory Visit (INDEPENDENT_AMBULATORY_CARE_PROVIDER_SITE_OTHER): Payer: Medicare Other | Admitting: Family Medicine

## 2021-02-16 DIAGNOSIS — I10 Essential (primary) hypertension: Secondary | ICD-10-CM

## 2021-02-16 DIAGNOSIS — E785 Hyperlipidemia, unspecified: Secondary | ICD-10-CM | POA: Diagnosis not present

## 2021-02-16 DIAGNOSIS — M25552 Pain in left hip: Secondary | ICD-10-CM | POA: Diagnosis not present

## 2021-02-16 DIAGNOSIS — M5442 Lumbago with sciatica, left side: Secondary | ICD-10-CM

## 2021-02-16 DIAGNOSIS — M25561 Pain in right knee: Secondary | ICD-10-CM | POA: Insufficient documentation

## 2021-02-16 DIAGNOSIS — F3341 Major depressive disorder, recurrent, in partial remission: Secondary | ICD-10-CM

## 2021-02-16 MED ORDER — METHYLPREDNISOLONE ACETATE 80 MG/ML IJ SUSP
40.0000 mg | Freq: Once | INTRAMUSCULAR | Status: AC
  Administered 2021-02-16: 40 mg via INTRAMUSCULAR

## 2021-02-16 MED ORDER — KETOROLAC TROMETHAMINE 60 MG/2ML IM SOLN
60.0000 mg | Freq: Once | INTRAMUSCULAR | Status: AC
Start: 2021-02-16 — End: 2021-02-16
  Administered 2021-02-16: 60 mg via INTRAMUSCULAR

## 2021-02-16 MED ORDER — PREDNISONE 5 MG PO TABS: 5.0000 mg | ORAL_TABLET | Freq: Two times a day (BID) | ORAL | 0 refills | Status: AC

## 2021-02-16 NOTE — Assessment & Plan Note (Signed)
2 month history, with pain and popping, no trauma, refer Ortho

## 2021-02-16 NOTE — Patient Instructions (Addendum)
Annual physical exam in office with MD end August, call if you need me before   Please get your covid booster as soon as possible, overdue  You are referred to Dr Aline Brochure re right knee pain  Toradol 60 mg , and depo Medrol 40 mg IM in office today for left hip pain followed by 5 days of prednisone  Need to reduce salt, processed foods and sodas, to improve blood pressure and also need to lose weight    It is important that you exercise regularly at least 30 minutes 5 times a week. If you develop chest pain, have severe difficulty breathing, or feel very tired, stop exercising immediately and seek medical attention  Think about what you will eat, plan ahead. Choose " clean, green, fresh or frozen" over canned, processed or packaged foods which are more sugary, salty and fatty. 70 to 75% of food eaten should be vegetables and fruit. Three meals at set times with snacks allowed between meals, but they must be fruit or vegetables. Aim to eat over a 12 hour period , example 7 am to 7 pm, and STOP after  your last meal of the day. Drink water,generally about 64 ounces per day, no other drink is as healthy. Fruit juice is best enjoyed in a healthy way, by EATING the fruit.  Thanks for choosing [facility], we consider it a privelige to serve you.

## 2021-02-20 ENCOUNTER — Encounter: Payer: Self-pay | Admitting: Family Medicine

## 2021-02-20 NOTE — Assessment & Plan Note (Signed)
Hyperlipidemia:Low fat diet discussed and encouraged.   Lipid Panel  Lab Results  Component Value Date   CHOL 171 08/10/2020   HDL 68 08/10/2020   LDLCALC 78 08/10/2020   TRIG 148 08/10/2020   CHOLHDL 2.5 08/10/2020   Controlled, no change in medication

## 2021-02-20 NOTE — Progress Notes (Signed)
   Toni Miller     MRN: [id number]      DOB: December 17, 1960   HPI Toni Miller is here for follow up and re-evaluation of chronic medical conditions, medication management and review of any available recent lab and radiology data.  Preventive health is updated, specifically  Cancer screening and Immunization.   Questions or concerns regarding consultations or procedures which the PT has had in the interim are  addressed. The PT denies any adverse reactions to current medications since the last visit.  C/o left hip pain and also right knee pain with swelling cracking and poopping  ROS Denies recent fever or chills. Denies sinus pressure, nasal congestion, ear pain or sore throat. Denies chest congestion, productive cough or wheezing. Denies chest pains, palpitations and leg swelling Denies abdominal pain, nausea, vomiting,diarrhea or constipation.   Denies dysuria, frequency, hesitancy or incontinence.  Denies headaches, seizures, numbness, or tingling. Denies depression, anxiety or insomnia. Denies skin break down or rash.   PE  BP 130/88   Pulse 97   Resp 16   Ht 5\' 1"  (1.549 m)   Wt 129 lb (58.5 kg)   SpO2 96%   BMI 24.37 kg/m   Patient alert and oriented and in no cardiopulmonary distress.  HEENT: No facial asymmetry, EOMI,     Neck supple .  Chest: Clear to auscultation bilaterally.  CVS: S1, S2 no murmurs, no S3.Regular rate.  ABD: Soft non tender.   Ext: No edema  MS: decreased ROM lumbar spine and left hip, swelling , deformity and tenderness over anterior right knee.  Skin: Intact, no ulcerations or rash noted.  Psych: Good eye contact, normal affect. Memory intact not anxious or depressed appearing.  CNS: CN 2-12 intact, power,  normal throughout.no focal deficits noted.   Assessment & Plan  Right anterior knee pain 2 month history, with pain and popping, no trauma, refer Ortho  Low back pain with left-sided sciatica Uncontrolled.Toradol and depo  medrol administered IM in the office , to be followed by a short course of oral prednisone and NSAIDS.   Left hip pain Uncontrolled.Toradol and depo medrol administered IM in the office , to be followed by a short course of oral prednisone and NSAIDS.   Hypertension Not at goal at visit, no med change DASH diet and commitment to daily physical activity for a minimum of 30 minutes discussed and encouraged, as a part of hypertension management. The importance of attaining a healthy weight is also discussed.  BP/Weight 02/16/2021 01/04/2021 09/02/2020 09/01/2020 08/17/2020 08/04/2020 6/38/7564  Systolic BP 332 951 884 166 063 016 010  Diastolic BP 88 74 74 78 84 91 95  Wt. (Lbs) 129 128.12 130 129.4 130 129 -  BMI 24.37 24.21 23.78 23.67 23.78 23.59 -  Some encounter information is confidential and restricted. Go to Review Flowsheets activity to see all data.       Hyperlipidemia with target LDL less than 100 Hyperlipidemia:Low fat diet discussed and encouraged.   Lipid Panel  Lab Results  Component Value Date   CHOL 171 08/10/2020   HDL 68 08/10/2020   LDLCALC 78 08/10/2020   TRIG 148 08/10/2020   CHOLHDL 2.5 08/10/2020   Controlled, no change in medication     Depression, major, recurrent, in partial remission (HCC) Controlled, no change in medication

## 2021-02-20 NOTE — Assessment & Plan Note (Signed)
Controlled, no change in medication  

## 2021-02-20 NOTE — Assessment & Plan Note (Signed)
Not at goal at visit, no med change DASH diet and commitment to daily physical activity for a minimum of 30 minutes discussed and encouraged, as a part of hypertension management. The importance of attaining a healthy weight is also discussed.  BP/Weight 02/16/2021 01/04/2021 09/02/2020 09/01/2020 08/17/2020 08/04/2020 2/76/3943  Systolic BP 200 379 444 619 012 224 114  Diastolic BP 88 74 74 78 84 91 95  Wt. (Lbs) 129 128.12 130 129.4 130 129 -  BMI 24.37 24.21 23.78 23.67 23.78 23.59 -  Some encounter information is confidential and restricted. Go to Review Flowsheets activity to see all data.

## 2021-02-20 NOTE — Assessment & Plan Note (Signed)
Uncontrolled.Toradol and depo medrol administered IM in the office , to be followed by a short course of oral prednisone and NSAIDS.  

## 2021-03-02 ENCOUNTER — Ambulatory Visit (INDEPENDENT_AMBULATORY_CARE_PROVIDER_SITE_OTHER): Payer: Medicare Other | Admitting: Orthopaedic Surgery

## 2021-03-02 ENCOUNTER — Encounter: Payer: Self-pay | Admitting: Gastroenterology

## 2021-03-02 ENCOUNTER — Other Ambulatory Visit: Payer: Self-pay

## 2021-03-02 ENCOUNTER — Ambulatory Visit (INDEPENDENT_AMBULATORY_CARE_PROVIDER_SITE_OTHER): Payer: Medicare Other | Admitting: Gastroenterology

## 2021-03-02 ENCOUNTER — Ambulatory Visit: Payer: Medicare Other

## 2021-03-02 ENCOUNTER — Encounter: Payer: Self-pay | Admitting: Orthopaedic Surgery

## 2021-03-02 VITALS — BP 148/101 | HR 78 | Ht 61.0 in | Wt 129.1 lb

## 2021-03-02 VITALS — BP 128/79 | HR 94 | Temp 97.0°F | Ht 61.0 in | Wt 128.8 lb

## 2021-03-02 DIAGNOSIS — M25561 Pain in right knee: Secondary | ICD-10-CM

## 2021-03-02 DIAGNOSIS — K5909 Other constipation: Secondary | ICD-10-CM | POA: Diagnosis not present

## 2021-03-02 DIAGNOSIS — G8929 Other chronic pain: Secondary | ICD-10-CM

## 2021-03-02 DIAGNOSIS — K625 Hemorrhage of anus and rectum: Secondary | ICD-10-CM | POA: Diagnosis not present

## 2021-03-02 MED ORDER — CLENPIQ 10-3.5-12 MG-GM -GM/160ML PO SOLN: 1.0000 | Freq: Once | ORAL | 0 refills | Status: AC

## 2021-03-02 MED ORDER — HYDROCORTISONE (PERIANAL) 2.5 % EX CREA: 1.0000 "application " | TOPICAL_CREAM | Freq: Two times a day (BID) | CUTANEOUS | 1 refills | Status: DC

## 2021-03-02 NOTE — Progress Notes (Signed)
Patient Toni Miller, female DOB:1960-12-12, 60 y.o. TKZ:[id number]  Chief Complaint  Patient presents with  . Knee Pain    R/hurting for about 3 or 4 months now.Used rubs and taken Tylenol for pain.    HPI  BEVERLEY ALLENDER is a 60 y.o. female who has had right knee pain for about three to four months and it has gotten worse. She has swelling and popping and giving way.  She saw Dr. Moshe Cipro for this 02-16-21.  She has tried Tylenol, Advil, rest, ice and heat with no help. Rubs have not helped.  She falls when the knee gives way.     Body mass index is 24.4 kg/m.  ROS  Review of Systems  Constitutional: Positive for activity change.  Musculoskeletal: Positive for arthralgias and gait problem.  All other systems reviewed and are negative.   All other systems reviewed and are negative.  The following is a summary of the past history medically, past history surgically, known current medicines, social history and family history.  This information is gathered electronically by the computer from prior information and documentation.  I review this each visit and have found including this information at this point in the chart is beneficial and informative.    Past Medical History:  Diagnosis Date  . Anxiety   . Arthritis   . Cancer ([place]) 1993   abnormal pap [facility] at [facility]  . Constipation   . Depression   . GERD (gastroesophageal reflux disease)   . Glaucoma 1993  . History of kidney stones   . Hot flashes, menopausal 06/12/2011  . Hyperlipidemia   . Hypertension   . Neck pain 7/09   WITH BULGING DISC-----RECIEVING EPIDURALS   . Sinusitis     Past Surgical History:  Procedure Laterality Date  . ABDOMINAL EXPLORATION SURGERY  age 46   bowel obstruction, [facility]  . ABDOMINAL HYSTERECTOMY  2007   [facility], [facility]  . BILATERAL EYE SURGERY     for glaucoma  . BIOPSY  01/09/2019   Procedure: BIOPSY;  Surgeon: Danie Binder, MD;  [facility];  Service: Endoscopy;;   gstric   . bone removed from under tongue    . BREAST BIOPSY Left    benign  . BREAST SURGERY Left 2004   left partial mastectomy, [facility]  . CATARACT EXTRACTION Right 06/04/2015  . CATARACT EXTRACTION W/PHACO  05/16/2011   Procedure: CATARACT EXTRACTION PHACO AND INTRAOCULAR LENS PLACEMENT (IOC);  Surgeon: Tonny Branch;  Location: [facility] OR[facility];  Service: Ophthalmology;  Laterality: Right;  . COLONOSCOPY N/A 05/17/2013   Dr. Barnie Alderman diverticulosis was noted/small internal hemorrhoids  . CYST REMOVED LEFT BREAST /BENIGN  2005   left, [facility]  . ESOPHAGOGASTRODUODENOSCOPY N/A 01/09/2019   normal esophagus. Mild NSAID gastritis s/p biopsy. +H.pylori. treated with amoxicillin, biaxin, Nexium.Marland Kitchen ERADICATION DOCUMENTED  . EYE SURGERY  2010, 1992 approx   bilateral  . REPAIR IMPERFORATE ANUS / ANORECTOPLASTY     per patient  . TOTAL ABDOMINAL HYSTERECTOMY W/ BILATERAL SALPINGOOPHORECTOMY  2007   [facility], [facility]  . TUBAL LIGATION  1991    Family History  Adopted: Yes  Problem Relation Age of Onset  . Alcohol abuse Mother   . Stomach cancer Mother 45  . Lung cancer Father   . Glaucoma Father   . Alcohol abuse Father   . Seizures Father   . Breast cancer Sister 52  . Breast cancer Sister 19  . Heart disease Sister   . Heart attack  Sister   . Glaucoma Son   . Glaucoma Maternal Grandfather   . Cancer Maternal Grandfather        poss leukemia  . SIDS Brother   . Hyperthyroidism Daughter   . Seizures Son        as a Sport and exercise psychologist  . Kidney disease Maternal Grandmother   . Heart disease Maternal Grandmother        Psychologist, forensic  . Leukemia Maternal Grandmother   . Colon cancer Neg Hx     Social History Social History   Tobacco Use  . Smoking status: Former Smoker    Packs/day: 3.00    Years: 2.00    Pack years: 6.00    Types: Cigarettes    Quit date: 05/12/1991    Years since quitting: 29.8  . Smokeless tobacco: Never Used  Vaping Use  . Vaping Use: Never used  Substance Use Topics  . Alcohol use: Yes     Comment: "sometimes"  . Drug use: Never    Allergies  Allergen Reactions  . Acyclovir And Related Rash    Current Outpatient Medications  Medication Sig Dispense Refill  . calcium-vitamin D (OSCAL-500) 500-400 MG-UNIT tablet Take 1 tablet by mouth 2 (two) times daily. 60 tablet 5  . cetirizine (ZYRTEC ALLERGY) 10 MG tablet Take 1 tablet (10 mg total) by mouth daily. 30 tablet 0  . cholecalciferol (VITAMIN D3) 25 MCG (1000 UT) tablet Take 1,000 Units by mouth daily.    Marland Kitchen CLENPIQ 10-3.5-12 MG-GM -GM/160ML SOLN Take 1 kit by mouth once for 1 dose. 320 mL 0  . conjugated estrogens (PREMARIN) vaginal cream Apply sparingly with fingertip three times weekly 42.5 g 2  . cyclobenzaprine (FLEXERIL) 5 MG tablet Take 1 tablet (5 mg total) by mouth 2 (two) times daily as needed for muscle spasms. 30 tablet 1  . cycloSPORINE (RESTASIS) 0.05 % ophthalmic emulsion Place 1 drop into both eyes 2 (two) times daily.    Marland Kitchen esomeprazole (NEXIUM) 40 MG capsule TAKE ONE CAPSULE BY MOUTH TWICE DAILY. (Patient taking differently: TAKE ONE CAPSULE BY MOUTH once daily) 60 capsule 5  . gabapentin (NEURONTIN) 300 MG capsule TAKE 1 CAPSULE BY MOUTH TWICE DAILY. 60 capsule 0  . hydrocortisone (ANUSOL-HC) 2.5 % rectal cream Place 1 application rectally 2 (two) times daily. For rectal itching and burning 30 g 1  . hydrOXYzine (ATARAX/VISTARIL) 10 MG tablet Take 10 mg by mouth daily as needed.    . montelukast (SINGULAIR) 10 MG tablet Take 1 tablet (10 mg total) by mouth daily. 90 tablet 1  . Multiple Vitamin (MULTIVITAMIN) capsule Take 1 capsule by mouth daily.    . naproxen (NAPROSYN) 500 MG tablet Take 500 mg by mouth 2 (two) times daily with a meal.    . neomycin-polymyxin-hydrocortisone (CORTISPORIN) OTIC solution Apply 1-2 drops to toe after soaking BID 10 mL 1  . OVER THE COUNTER MEDICATION Place 1 application vaginally every other day. Replenish - Moisture    . polyethylene glycol powder (GLYCOLAX/MIRALAX) powder  MIX 1 CAPFUL (17G) IN 8 OUNCES OF JUICE/WATER AND DRINK ONCE DAILY. (Patient taking differently: Take 17 g by mouth as needed for moderate constipation. MIX 1 CAPFUL (17G) IN 8 OUNCES OF JUICE/WATER AND DRINK ONCE DAILY.) 255 g 3  . rosuvastatin (CRESTOR) 10 MG tablet TAKE (1) TABLET BY MOUTH AT BEDTIME. 90 tablet 0  . TRAVATAN Z 0.004 % SOLN ophthalmic solution Place 1 drop into both eyes at bedtime.    . triamcinolone  cream (KENALOG) 0.1 % Apply 1 application topically 2 (two) times daily. 30 g 0  . triamterene-hydrochlorothiazide (MAXZIDE-25) 37.5-25 MG tablet TAKE 1 & 1/2 TABLETS DAILY FOR BLOOD PRESSURE. 135 tablet 0  . venlafaxine XR (EFFEXOR XR) 37.5 MG 24 hr capsule Take 1 capsule (37.5 mg total) by mouth daily with breakfast. 90 capsule 0  . fluticasone (FLONASE) 50 MCG/ACT nasal spray Place 1 spray into both nostrils daily for 14 days. 16 g 0   No current facility-administered medications for this visit.     Physical Exam  Blood pressure (!) 148/101, pulse 78, height '5\' 1"'  (1.549 m), weight 129 lb 2 oz (58.6 kg).  Constitutional: overall normal hygiene, normal nutrition, well developed, normal grooming, normal body habitus. Assistive device:none  Musculoskeletal: gait and station Limp right, muscle tone and strength are normal, no tremors or atrophy is present.  .  Neurological: coordination overall normal.  Deep tendon reflex/nerve stretch intact.  Sensation normal.  Cranial nerves II-XII intact.   Skin:   Normal overall no scars, lesions, ulcers or rashes. No psoriasis.  Psychiatric: Alert and oriented x 3.  Recent memory intact, remote memory unclear.  Normal mood and affect. Well groomed.  Good eye contact.  Cardiovascular: overall no swelling, no varicosities, no edema bilaterally, normal temperatures of the legs and arms, no clubbing, cyanosis and good capillary refill.  Lymphatic: palpation is normal.  Right knee with pain, crepitus, small effusion, ROM 0 to 105 with  pain, limp right, positive medial [place], [place] intact.  All other systems reviewed and are negative   The patient has been educated about the nature of the problem(s) and counseled on treatment options.  The patient appeared to understand what I have discussed and is in agreement with it.  Encounter Diagnosis  Name Primary?  . Chronic pain of right knee Yes  X-rays were done of the right knee, reported separately.  PROCEDURE NOTE:  The patient requests injections of the right knee , verbal consent was obtained.  The right knee was prepped appropriately after time out was performed.   Sterile technique was observed and injection of 1 cc of Celestone 6 mg with several cc's of plain xylocaine. Anesthesia was provided by ethyl chloride and a 20-gauge needle was used to inject the knee area. The injection was tolerated well.  A band aid dressing was applied.  The patient was advised to apply ice later today and tomorrow to the injection sight as needed.  I am concerned about meniscus tear of the right knee. She may need MRI.  PLAN Call if any problems.  Precautions discussed.  Continue current medications.   Return to clinic 1 month   Electronically Signed Sanjuana Kava, MD 4/26/20222:15 PM

## 2021-03-02 NOTE — Progress Notes (Signed)
Referring Provider: [name], MD Primary Care Physician:  [name], MD Primary GI: Dr. Abbey Chatters  Chief Complaint  Patient presents with  . Hemorrhoids    C/o occas bleeding, burning/itching. Can be uncomfortable at times  . Constipation    2 days out of the week will have issues    HPI:   Toni Miller is a 60 y.o. female presenting today with a history of chronic constipation, GERD, +H.yplori 2020 with documented eradication via breath test in Nov 2021.    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 Rectal itching and burning. Mild discomfort. Intermittent bleeding. States she has had hemorrhoids since she was young. Life long history of constipation. Will drink coffee to help with constipation. BM every 2-3 days. Straining on toilet at times.no supplemental fiber. Will take Miralax at times.    Appears she may have taken Linzess 145 mcg in the past.    Past Medical History:  Diagnosis Date  . Anxiety   . Arthritis   . Cancer ([place]) 1993   abnormal pap [facility] at [facility]  . Constipation   . Depression   . GERD (gastroesophageal reflux disease)   . Glaucoma 1993  . History of kidney stones   . Hot flashes, menopausal 06/12/2011  . Hyperlipidemia   . Hypertension   . Neck pain 7/09   WITH BULGING DISC-----RECIEVING EPIDURALS   . Sinusitis     Past Surgical History:  Procedure Laterality Date  . ABDOMINAL EXPLORATION SURGERY  age 16   bowel obstruction, [facility]  . ABDOMINAL HYSTERECTOMY  2007   [facility], [facility]  . BILATERAL EYE SURGERY     for glaucoma  . BIOPSY  01/09/2019   Procedure: BIOPSY;  Surgeon: Danie Binder, MD;  [facility];  Service: Endoscopy;;  gstric   . bone removed from under tongue    . BREAST BIOPSY Left    benign  . BREAST SURGERY Left 2004   left partial mastectomy, [facility]  . CATARACT EXTRACTION Right 06/04/2015  . CATARACT EXTRACTION W/PHACO  05/16/2011   Procedure: CATARACT  EXTRACTION PHACO AND INTRAOCULAR LENS PLACEMENT (IOC);  Surgeon: Tonny Branch;  Location: [facility];  Service: Ophthalmology;  Laterality: Right;  . COLONOSCOPY N/A 05/17/2013   Dr. Barnie Alderman diverticulosis was noted/small internal hemorrhoids  . CYST REMOVED LEFT BREAST /BENIGN  2005   left, [facility]  . ESOPHAGOGASTRODUODENOSCOPY N/A 01/09/2019   normal esophagus. Mild NSAID gastritis s/p biopsy. +H.pylori. treated with amoxicillin, biaxin, Nexium.  Marland Kitchen EYE SURGERY  2010, 1992 approx   bilateral  . REPAIR IMPERFORATE ANUS / ANORECTOPLASTY     per patient  . TOTAL ABDOMINAL HYSTERECTOMY W/ BILATERAL SALPINGOOPHORECTOMY  2007   [facility], [facility]  . TUBAL LIGATION  1991    Current Outpatient Medications  Medication Sig Dispense Refill  . calcium-vitamin D (OSCAL-500) 500-400 MG-UNIT tablet Take 1 tablet by mouth 2 (two) times daily. 60 tablet 5  . cetirizine (ZYRTEC ALLERGY) 10 MG tablet Take 1 tablet (10 mg total) by mouth daily. 30 tablet 0  . cholecalciferol (VITAMIN D3) 25 MCG (1000 UT) tablet Take 1,000 Units by mouth daily.    Marland Kitchen conjugated estrogens (PREMARIN) vaginal cream Apply sparingly with fingertip three times weekly 42.5 g 2  . cyclobenzaprine (FLEXERIL) 5 MG tablet Take 1 tablet (5 mg total) by mouth 2 (two) times daily as needed for muscle spasms. 30 tablet 1  .  cycloSPORINE (RESTASIS) 0.05 % ophthalmic emulsion Place 1 drop into both eyes 2 (two) times daily.    Marland Kitchen esomeprazole (NEXIUM) 40 MG capsule TAKE ONE CAPSULE BY MOUTH TWICE DAILY. (Patient taking differently: TAKE ONE CAPSULE BY MOUTH once daily) 60 capsule 5  . fluticasone (FLONASE) 50 MCG/ACT nasal spray Place 1 spray into both nostrils daily for 14 days. 16 g 0  . gabapentin (NEURONTIN) 300 MG capsule TAKE 1 CAPSULE BY MOUTH TWICE DAILY. 60 capsule 0  . hydrOXYzine (ATARAX/VISTARIL) 10 MG tablet Take 10 mg by mouth daily as needed.    . montelukast (SINGULAIR) 10 MG tablet Take 1 tablet (10 mg total) by mouth daily. 90 tablet 1  .  Multiple Vitamin (MULTIVITAMIN) capsule Take 1 capsule by mouth daily.    . naproxen (NAPROSYN) 500 MG tablet Take 500 mg by mouth 2 (two) times daily with a meal.    . neomycin-polymyxin-hydrocortisone (CORTISPORIN) OTIC solution Apply 1-2 drops to toe after soaking BID 10 mL 1  . OVER THE COUNTER MEDICATION Place 1 application vaginally every other day. Replenish - Moisture    . polyethylene glycol powder (GLYCOLAX/MIRALAX) powder MIX 1 CAPFUL (17G) IN 8 OUNCES OF JUICE/WATER AND DRINK ONCE DAILY. (Patient taking differently: Take 17 g by mouth as needed for moderate constipation. MIX 1 CAPFUL (17G) IN 8 OUNCES OF JUICE/WATER AND DRINK ONCE DAILY.) 255 g 3  . rosuvastatin (CRESTOR) 10 MG tablet TAKE (1) TABLET BY MOUTH AT BEDTIME. 90 tablet 0  . TRAVATAN Z 0.004 % SOLN ophthalmic solution Place 1 drop into both eyes at bedtime.    . triamcinolone cream (KENALOG) 0.1 % Apply 1 application topically 2 (two) times daily. 30 g 0  . triamterene-hydrochlorothiazide (MAXZIDE-25) 37.5-25 MG tablet TAKE 1 & 1/2 TABLETS DAILY FOR BLOOD PRESSURE. 135 tablet 0  . venlafaxine XR (EFFEXOR XR) 37.5 MG 24 hr capsule Take 1 capsule (37.5 mg total) by mouth daily with breakfast. 90 capsule 0   No current facility-administered medications for this visit.    Allergies as of 03/02/2021 - Review Complete 03/02/2021  Allergen Reaction Noted  . Acyclovir and related Rash 03/02/2015    Family History  Adopted: Yes  Problem Relation Age of Onset  . Alcohol abuse Mother   . Stomach cancer Mother 18  . Lung cancer Father   . Glaucoma Father   . Alcohol abuse Father   . Seizures Father   . Breast cancer Sister 22  . Breast cancer Sister 46  . Heart disease Sister   . Heart attack Sister   . Glaucoma Son   . Glaucoma Maternal Grandfather   . Cancer Maternal Grandfather        poss leukemia  . SIDS Brother   . Hyperthyroidism Daughter   . Seizures Son        as a Sport and exercise psychologist  . Kidney disease Maternal  Grandmother   . Heart disease Maternal Grandmother        Psychologist, forensic  . Leukemia Maternal Grandmother   . Colon cancer Neg Hx     Social History   Socioeconomic History  . Marital status: Significant Other    Spouse name: Not on file  . Number of children: 3  . Years of education: 26  . Highest education level: 12th grade  Occupational History  . Occupation: UNEMPLOYED 2011    Comment: disability    Employer: DISABLED  Tobacco Use  . Smoking status: Former Smoker    Packs/day: 3.00  Years: 2.00    Pack years: 6.00    Types: Cigarettes    Quit date: 05/12/1991    Years since quitting: 29.8  . Smokeless tobacco: Never Used  Vaping Use  . Vaping Use: Never used  Substance and Sexual Activity  . Alcohol use: Yes    Comment: "sometimes"  . Drug use: Never  . Sexual activity: Yes    Birth control/protection: Surgical  Other Topics Concern  . Not on file  Social History Narrative   Lives 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   caffien- coffee, 1 cup daily   Social Determinants of Health   Financial Resource Strain: Low Risk   . Difficulty of Paying Living Expenses: Not very hard  Food Insecurity: No Food Insecurity  . Worried About Charity fundraiser in the Last Year: Never true  . Ran Out of Food in the Last Year: Never true  Transportation Needs: No Transportation Needs  . Lack of Transportation (Medical): No  . Lack of Transportation (Non-Medical): No  Physical Activity: Insufficiently Active  . Days of Exercise per Week: 1 day  . Minutes of Exercise per Session: 30 min  Stress: No Stress Concern Present  . Feeling of Stress : Only a little  Social Connections: Moderately Isolated  . Frequency of Communication with Friends and Family: Three times a week  . Frequency of Social Gatherings with Friends and Family: Never  . Attends Religious Services: More than 4 times per year  . Active Member of Clubs or Organizations: No  . Attends Archivist  Meetings: Never  . Marital Status: Divorced    Review of Systems: Gen: Denies fever, chills, anorexia. Denies fatigue, weakness, weight loss.  CV: Denies chest pain, palpitations, syncope, peripheral edema, and claudication. Resp: Denies dyspnea at rest, cough, wheezing, coughing up blood, and pleurisy. GI: see HPI Derm: Denies rash, itching, dry skin Psych: Denies depression, anxiety, memory loss, confusion. No homicidal or suicidal ideation.  Heme: see HPI  Physical Exam: BP 128/79   Pulse 94   Temp (!) 97 F (36.1 C)   Ht 5\' 1"  (1.549 m)   Wt 128 lb 12.8 oz (58.4 kg)   BMI 24.34 kg/m  General:   Alert and oriented. No distress noted. Pleasant and cooperative.  Head:  Normocephalic and atraumatic. Eyes:  Conjuctiva clear without scleral icterus. Mouth:  Mask in place Abdomen:  +BS, soft, non-tender and non-distended. No rebound or guarding. No HSM or masses noted. Rectal: prolapsing grade 3 hemorrhoids, small sphincter Msk:  Symmetrical without gross deformities. Normal posture. Extremities:  Without edema. Neurologic:  Alert and  oriented x4 Psych:  Alert and cooperative. Normal mood and affect.  ASSESSMENT: Toni Miller is a 60 y.o. female presenting today due to rectal burning and itching, rectal bleeding, with history of hemorrhoids on last colonoscopy in 2014. 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 Rectal exam with prolapsing hemorrhoids.   Constipation not ideally managed. Mild and may respond to just fiber alone, so I have asked her to start Benefiber daily. If no improvement after a week, she is to add Linzess 145 mcg daily. Samples provided.   Will pursue colonoscopy due to rectal bleeding and likely banding thereafter.     PLAN:  Proceed with colonoscopy by Dr. Abbey Chatters  in near future: the risks, benefits, and alternatives have been discussed with the patient in detail. The patient states understanding and desires to proceed.  Benefiber  daily Linzess  145 mcg daily samples provided Return thereafter for possible banding  Annitta Needs, PhD, [facility] Gastroenterology

## 2021-03-02 NOTE — Patient Instructions (Signed)
For constipation: let's start supplemental fiber (Benefiber or equivalent) 2 teaspoons daily. Give this about a week. If no improvement in constipation, then you can start Linzess 1 capsule each morning on an empty stomach. It's best to take on an empty stomach to avoid diarrhea.  We are arranging a colonoscopy in the near future!  I will see you thereafter for possible banding  I enjoyed seeing you again today! As you know, I value our relationship and want to provide genuine, compassionate, and quality care. I welcome your feedback. If you receive a survey regarding your visit,  I greatly appreciate you taking time to fill this out. See you next time!  Annitta Needs, PhD, ANP-BC [facility] Gastroenterology

## 2021-03-07 ENCOUNTER — Other Ambulatory Visit: Payer: Self-pay

## 2021-03-07 ENCOUNTER — Ambulatory Visit (INDEPENDENT_AMBULATORY_CARE_PROVIDER_SITE_OTHER): Payer: Medicare Other

## 2021-03-07 ENCOUNTER — Encounter: Payer: Self-pay | Admitting: *Deleted

## 2021-03-07 ENCOUNTER — Ambulatory Visit
Admission: EM | Admit: 2021-03-07 | Discharge: 2021-03-07 | Disposition: A | Discharge status: Home / Self Care | Payer: Medicare Other | Attending: Family Medicine | Admitting: Family Medicine

## 2021-03-07 DIAGNOSIS — R059 Cough, unspecified: Secondary | ICD-10-CM

## 2021-03-07 DIAGNOSIS — E860 Dehydration: Secondary | ICD-10-CM | POA: Diagnosis not present

## 2021-03-07 DIAGNOSIS — R000 Tachycardia, unspecified: Secondary | ICD-10-CM | POA: Diagnosis not present

## 2021-03-07 DIAGNOSIS — R079 Chest pain, unspecified: Secondary | ICD-10-CM

## 2021-03-07 DIAGNOSIS — A084 Viral intestinal infection, unspecified: Secondary | ICD-10-CM

## 2021-03-07 MED ORDER — ONDANSETRON HCL 4 MG PO TABS: 4.0000 mg | ORAL_TABLET | Freq: Four times a day (QID) | ORAL | 0 refills | Status: DC

## 2021-03-07 MED ORDER — SODIUM CHLORIDE 0.9 % IV BOLUS
500.0000 mL | Freq: Once | INTRAVENOUS | Status: AC
  Administered 2021-03-07: 500 mL via INTRAVENOUS

## 2021-03-07 MED ORDER — ONDANSETRON HCL 4 MG/2ML IJ SOLN
4.0000 mg | Freq: Once | INTRAMUSCULAR | Status: AC
  Administered 2021-03-07: 4 mg via INTRAVENOUS

## 2021-03-07 NOTE — ED Provider Notes (Signed)
[facility]    CSN: [id number] Arrival date & time: 03/07/21  0911      History   Chief Complaint Chief Complaint  Patient presents with  . Cough  . Abdominal Pain    HPI Toni Miller is a 60 y.o. female.   HPI Patient presents with epigastric pain, nausea, and vomiting. No diarrhea. No appetite and not drinking any very fluids. Chills started today. She complains of chest pain which describes as burning  Sensation. She has GERD and takes medication for symptoms and has continued to take tolerate Nexium despite vomiting.  She generally feels weak and unwell x 2 days.  Past Medical History:  Diagnosis Date  . Anxiety   . Arthritis   . Cancer ([place]) 1993   abnormal pap treated at [facility]  . Constipation   . Depression   . GERD (gastroesophageal reflux disease)   . Glaucoma 1993  . History of kidney stones   . Hot flashes, menopausal 06/12/2011  . Hyperlipidemia   . Hypertension   . Neck pain 7/09   WITH BULGING DISC-----RECIEVING EPIDURALS   . Sinusitis     Patient Active Problem List   Diagnosis Date Noted  . Rectal bleeding 03/02/2021  . Right anterior knee pain 02/16/2021  . H. pylori infection 09/01/2020  . Mild intermittent asthma without complication 29/79/8921  . 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 Anxiety   . Menopausal vaginal dryness 10/09/2019  . Knee pain, left 04/26/2019  . Left hip pain 04/26/2019  . Dyspepsia 12/12/2018  . Shoulder pain, right 06/06/2018  . FH: breast cancer in first degree relative when <9 years old 06/06/2018  . Arthritis of both knees 04/25/2015  . Internal hemorrhoids with complication 19/41/7408  . Ingrown toenail of both feet 06/23/2014  . Osteopenia 04/02/2014  . Depression, major, recurrent, in partial remission ([place]) 01/06/2014  . H/O abnormal Pap smear 04/20/2013  . Cough 12/19/2012  . Dermatitis 12/19/2011  . Thyroid nodule 08/16/2011  . Chronic constipation  06/01/2011  . Low back pain with left-sided sciatica 12/29/2009  . Allergic rhinitis 05/14/2008  . Hyperlipidemia with target LDL less than 100 01/29/2008  . GLAUCOMA 01/29/2008  . Hypertension 01/29/2008  . GERD 01/29/2008    Past Surgical History:  Procedure Laterality Date  . ABDOMINAL EXPLORATION SURGERY  age 51   bowel obstruction, [facility]  . ABDOMINAL HYSTERECTOMY  2007   [facility], [facility]  . BILATERAL EYE SURGERY     for glaucoma  . BIOPSY  01/09/2019   Procedure: BIOPSY;  Surgeon: Danie Binder, MD;  Location: [facility];  Service: Endoscopy;;  gstric   . bone removed from under tongue    . BREAST BIOPSY Left    benign  . BREAST SURGERY Left 2004   left partial mastectomy, [facility]  . CATARACT EXTRACTION Right 06/04/2015  . CATARACT EXTRACTION W/PHACO  05/16/2011   Procedure: CATARACT EXTRACTION PHACO AND INTRAOCULAR LENS PLACEMENT (IOC);  Surgeon: Tonny Branch;  Location: [facility];  Service: Ophthalmology;  Laterality: Right;  . COLONOSCOPY N/A 05/17/2013   Dr. Barnie Alderman diverticulosis was noted/small internal hemorrhoids  . CYST REMOVED LEFT BREAST /BENIGN  2005   left, [facility]  . ESOPHAGOGASTRODUODENOSCOPY N/A 01/09/2019   normal esophagus. Mild NSAID gastritis s/p biopsy. +H.pylori. treated with amoxicillin, biaxin, Nexium.Marland Kitchen ERADICATION DOCUMENTED  . EYE SURGERY  2010, 1992 approx   bilateral  . REPAIR IMPERFORATE ANUS / ANORECTOPLASTY     per patient  .  TOTAL ABDOMINAL HYSTERECTOMY W/ BILATERAL SALPINGOOPHORECTOMY  2007   [facility], [place]  . TUBAL LIGATION  1991    OB History   No obstetric history on file.      Home Medications    Prior to Admission medications   Medication Sig Start Date End Date Taking? Authorizing Provider  calcium-vitamin D (OSCAL-500) 500-400 MG-UNIT tablet Take 1 tablet by mouth 2 (two) times daily. 08/17/20  Yes [name], MD  cholecalciferol (VITAMIN D3) 25 MCG (1000 UT) tablet Take 1,000 Units by mouth daily.   Yes [provider]   conjugated estrogens (PREMARIN) vaginal cream Apply sparingly with fingertip three times weekly 10/09/19  Yes [name], MD  cyclobenzaprine (FLEXERIL) 5 MG tablet Take 1 tablet (5 mg total) by mouth 2 (two) times daily as needed for muscle spasms. 01/04/21  Yes Lindell Spar, MD  cycloSPORINE (RESTASIS) 0.05 % ophthalmic emulsion Place 1 drop into both eyes 2 (two) times daily.   Yes [provider]  esomeprazole (NEXIUM) 40 MG capsule TAKE ONE CAPSULE BY MOUTH TWICE DAILY. Patient taking differently: TAKE ONE CAPSULE BY MOUTH once daily 01/12/21  Yes Annitta Needs, NP  fluticasone [facility]) 50 MCG/ACT nasal spray Place 1 spray into both nostrils daily for 14 days. 07/31/20 08/14/20 Yes Avegno, Darrelyn Hillock, FNP  gabapentin (NEURONTIN) 300 MG capsule TAKE 1 CAPSULE BY MOUTH TWICE DAILY. 02/09/21  Yes [name], MD  hydrocortisone (ANUSOL-HC) 2.5 % rectal cream Place 1 application rectally 2 (two) times daily. For rectal itching and burning 03/02/21  Yes Annitta Needs, NP  hydrOXYzine (ATARAX/VISTARIL) 10 MG tablet Take 10 mg by mouth daily as needed. 12/19/20  Yes [provider]  montelukast (SINGULAIR) 10 MG tablet Take 1 tablet (10 mg total) by mouth daily. 04/15/20  Yes [name], MD  Multiple Vitamin (MULTIVITAMIN) capsule Take 1 capsule by mouth daily.   Yes [provider]  naproxen (NAPROSYN) 500 MG tablet Take 500 mg by mouth 2 (two) times daily with a meal.   Yes [provider]  neomycin-polymyxin-hydrocortisone (CORTISPORIN) OTIC solution Apply 1-2 drops to toe after soaking BID 03/26/20  Yes Regal, Tamala Fothergill, DPM  ondansetron (ZOFRAN) 4 MG tablet Take 1 tablet (4 mg total) by mouth every 6 (six) hours. 03/07/21  Yes Scot Jun, FNP  polyethylene glycol powder (GLYCOLAX/MIRALAX) powder MIX 1 CAPFUL (17G) IN 8 OUNCES OF JUICE/WATER AND DRINK ONCE DAILY. Patient taking differently: Take 17 g by mouth as needed for moderate  constipation. MIX 1 CAPFUL (17G) IN 8 OUNCES OF JUICE/WATER AND DRINK ONCE DAILY. 01/04/17  Yes [name], MD  rosuvastatin (CRESTOR) 10 MG tablet TAKE (1) TABLET BY MOUTH AT BEDTIME. 10/05/20  Yes [name], MD  TRAVATAN Z 0.004 % SOLN ophthalmic solution Place 1 drop into both eyes at bedtime. 02/16/16  Yes [provider]  triamterene-hydrochlorothiazide (MAXZIDE-25) 37.5-25 MG tablet TAKE 1 & 1/2 TABLETS DAILY FOR BLOOD PRESSURE. 10/05/20  Yes [name], MD  venlafaxine XR (EFFEXOR XR) 37.5 MG 24 hr capsule Take 1 capsule (37.5 mg total) by mouth daily with breakfast. 12/01/20  Yes [name], MD  cetirizine (ZYRTEC ALLERGY) 10 MG tablet Take 1 tablet (10 mg total) by mouth daily. 07/31/20   Avegno, Darrelyn Hillock, FNP  OVER THE COUNTER MEDICATION Place 1 application vaginally every other day. Replenish - Moisture    [provider]  triamcinolone cream (KENALOG) 0.1 % Apply 1 application  topically 2 (two) times daily. 01/20/20   Corum, Rex Kras, MD    Family History Family History  Adopted: Yes  Problem Relation Age of Onset  . Alcohol abuse Mother   . Stomach cancer Mother 49  . Lung cancer Father   . Glaucoma Father   . Alcohol abuse Father   . Seizures Father   . Breast cancer Sister 64  . Breast cancer Sister 93  . Heart disease Sister   . Heart attack Sister   . Glaucoma Son   . Glaucoma Maternal Grandfather   . Cancer Maternal Grandfather        poss leukemia  . SIDS Brother   . Hyperthyroidism Daughter   . Seizures Son        as a Sport and exercise psychologist  . Kidney disease Maternal Grandmother   . Heart disease Maternal Grandmother        Psychologist, forensic  . Leukemia Maternal Grandmother   . Colon cancer Neg Hx     Social History Social History   Tobacco Use  . Smoking status: Former Smoker    Packs/day: 3.00    Years: 2.00    Pack years: 6.00    Types: Cigarettes    Quit date: 05/12/1991    Years since quitting: 29.8  . Smokeless  tobacco: Never Used  Vaping Use  . Vaping Use: Never used  Substance Use Topics  . Alcohol use: Yes    Comment: occasionally  . Drug use: Never     Allergies   Acyclovir and related   Review of Systems Review of Systems Pertinent negatives listed in HPI   Physical Exam Triage Vital Signs ED Triage Vitals [03/07/21 0951]  Enc Vitals Group     BP 111/70     Pulse Rate (!) 121     Resp 20     Temp 98 F (36.7 C)     Temp Source Temporal     SpO2 97 %     Weight      Height      Head Circumference      Peak Flow      Pain Score 6     Pain Loc      Pain Edu?      Excl. in [place]?    No data found.  Updated Vital Signs BP 103/71   Pulse (!) 107   Temp 98 F (36.7 C) (Temporal)   Resp 18   SpO2 98%   Visual Acuity Right Eye Distance:   Left Eye Distance:   Bilateral Distance:    Right Eye Near:   Left Eye Near:    Bilateral Near:     Physical Exam Constitutional:      Appearance: She is ill-appearing. She is not toxic-appearing.  HENT:     Nose: Nose normal.  Eyes:     Extraocular Movements: Extraocular movements intact.     Pupils: Pupils are equal, round, and reactive to light.  Cardiovascular:     Rate and Rhythm: Regular rhythm. Tachycardia present.  Pulmonary:     Effort: Pulmonary effort is normal.     Breath sounds: Normal breath sounds and air entry. No decreased breath sounds.  Abdominal:     General: Bowel sounds are increased.     Tenderness: There is abdominal tenderness in the epigastric area.  Musculoskeletal:        General: Normal range of motion.     Cervical back: Normal range of motion. No rigidity.  Lymphadenopathy:     Cervical: No cervical adenopathy.  Skin:    General: Skin is warm and dry.     Capillary Refill: Capillary refill takes less than 2 seconds.  Neurological:     General: No focal deficit present.     Mental Status: She is alert.  Psychiatric:        Mood and Affect: Mood normal.        Behavior: Behavior  normal.        Thought Content: Thought content normal.        Judgment: Judgment normal.      UC Treatments / Results  Labs (all labs ordered are listed, but only abnormal results are displayed) Labs Reviewed - No data to display  EKG Sinus tachycardia, 121 BPM, no ST changes    Radiology DG Chest 2 View  Result Date: 03/07/2021 CLINICAL DATA:  Chest pain and cough. EXAM: CHEST - 2 VIEW COMPARISON:  08/03/2020 FINDINGS: The lungs are clear without focal pneumonia, edema, pneumothorax or pleural effusion. The cardiopericardial silhouette is within normal limits for size. The visualized bony structures of the thorax show no acute abnormality. IMPRESSION: No active cardiopulmonary disease. Electronically Signed   By: Misty Stanley M.D.   On: 03/07/2021 10:21    Procedures Procedures (including critical care time)  Medications Ordered in UC Medications  sodium chloride 0.9 % bolus 500 mL (0 mLs Intravenous Stopped 03/07/21 1147)  ondansetron (ZOFRAN) injection 4 mg (4 mg Intravenous Given 03/07/21 1059)    Initial Impression / Assessment and Plan / UC Course  I have reviewed the triage vital signs and the nursing notes.  Pertinent labs & imaging results that were available during my care of the patient were reviewed by me and considered in my medical decision making (see chart for details).    Viral gastroenteritis and dehydration  Bolused 5 cc of normal saline along with patient given IV Zofran 4 mg.  Patient reported improvement of symptoms following treatment here in clinic today.  Discharging home with Zofran to continue management of nausea.  Advised to increase diet as tolerated however she needs to force fluids as the weakness is likely resulting from dehydration.  Strict ER precautions if any of her symptoms worsen or if they do not improve.  Patient verbalized understanding and agreement with plan. Final Clinical Impressions(s) / UC Diagnoses   Final diagnoses:  Viral  gastroenteritis  Dehydration  Tachycardia     Discharge Instructions     If any of your symptoms worsen or do not improve by tomorrow go immediately to the emergency department for further work-up and evaluation.  Force fluids today and try and eat chicken broth or soup throughout the day for dehydration.  I have sent prescribed you Zofran you may take 1 tablet every 6 hours as needed for nausea.    ED Prescriptions    Medication Sig Dispense Auth. Provider   ondansetron (ZOFRAN) 4 MG tablet Take 1 tablet (4 mg total) by mouth every 6 (six) hours. 14 tablet Scot Jun, FNP     PDMP not reviewed this encounter.   Scot Jun, FNP 03/07/21 [phone]

## 2021-03-07 NOTE — Discharge Instructions (Signed)
If any of your symptoms worsen or do not improve by tomorrow go immediately to the emergency department for further work-up and evaluation.  Force fluids today and try and eat chicken broth or soup throughout the day for dehydration.  I have sent prescribed you Zofran you may take 1 tablet every 6 hours as needed for nausea.

## 2021-03-07 NOTE — ED Triage Notes (Signed)
C/O epigastric pain, cough, chills, vomiting over past 2 days; states feels like when she's had pneumonia.  States able to keep down some PO fluids.  Currently c/o chest pain.

## 2021-03-09 ENCOUNTER — Telehealth: Payer: Self-pay

## 2021-03-09 NOTE — Telephone Encounter (Signed)
Patient is experiencing dry mouth and sleepiness with the antidepressant she is currently taking she wants to speak with someone ph# [phone]

## 2021-03-09 NOTE — Telephone Encounter (Signed)
Pt hasnt been seen since 02-16-21 please schedule virtual appt

## 2021-03-09 NOTE — Progress Notes (Signed)
Cc'ed to pcp °
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
## 2021-03-10 ENCOUNTER — Telehealth (INDEPENDENT_AMBULATORY_CARE_PROVIDER_SITE_OTHER): Payer: Medicare Other | Admitting: Psychiatry

## 2021-03-10 ENCOUNTER — Encounter: Payer: Self-pay | Admitting: Psychiatry

## 2021-03-10 ENCOUNTER — Other Ambulatory Visit: Payer: Self-pay

## 2021-03-10 DIAGNOSIS — F3341 Major depressive disorder, recurrent, in partial remission: Secondary | ICD-10-CM | POA: Diagnosis not present

## 2021-03-10 MED ORDER — VENLAFAXINE HCL ER 37.5 MG PO CP24: 37.5000 mg | ORAL_CAPSULE | Freq: Every day | ORAL | 0 refills | Status: DC

## 2021-03-10 NOTE — Patient Instructions (Signed)
1. Continue venlafaxine 37.5 mg daily  2.Next appointment: 7/29 at 8:40

## 2021-03-11 NOTE — Telephone Encounter (Signed)
LVM for patient to schedule mychart visit

## 2021-03-22 ENCOUNTER — Other Ambulatory Visit: Payer: Self-pay | Admitting: Family Medicine

## 2021-03-30 ENCOUNTER — Encounter: Payer: Self-pay | Admitting: Orthopaedic Surgery

## 2021-03-30 ENCOUNTER — Other Ambulatory Visit: Payer: Self-pay

## 2021-03-30 ENCOUNTER — Ambulatory Visit (INDEPENDENT_AMBULATORY_CARE_PROVIDER_SITE_OTHER): Payer: Medicare Other | Admitting: Orthopaedic Surgery

## 2021-03-30 VITALS — BP 132/96 | HR 82 | Ht 61.0 in | Wt 129.0 lb

## 2021-03-30 DIAGNOSIS — M25561 Pain in right knee: Secondary | ICD-10-CM

## 2021-03-30 DIAGNOSIS — G8929 Other chronic pain: Secondary | ICD-10-CM | POA: Diagnosis not present

## 2021-03-30 NOTE — Progress Notes (Signed)
My knee is much worse.  She has more pain in the right knee.  She has giving way.  She has no new trauma. She has no redness.  ROM of the right knee is 0 to 105, crepitus is present, small effusion, positive medial [place].  Limp to the right.  NV intact.  She says the injection only helped a little.  I am concerned about meniscus tear.  I will get MRI.  Encounter Diagnosis  Name Primary?  . Chronic pain of right knee Yes   Get MRI of the right knee.   Return in three weeks.  Call if any problem.  Precautions discussed.   Electronically Signed Sanjuana Kava, MD 5/24/202211:20 AM

## 2021-03-30 NOTE — Addendum Note (Signed)
Addended by: Derek Mound A on: 03/30/2021 11:34 AM   Modules accepted: Orders

## 2021-04-07 ENCOUNTER — Other Ambulatory Visit: Payer: Self-pay | Admitting: Family Medicine

## 2021-04-08 ENCOUNTER — Telehealth: Payer: Self-pay | Admitting: *Deleted

## 2021-04-08 NOTE — Telephone Encounter (Signed)
PA approved via [facility] website. Auth #: [id number] , Requested Dates: Apr 12, 2021 - Jul 11, 2021

## 2021-04-09 ENCOUNTER — Other Ambulatory Visit: Payer: Self-pay

## 2021-04-09 ENCOUNTER — Other Ambulatory Visit (HOSPITAL_COMMUNITY)
Admission: RE | Admit: 2021-04-09 | Discharge: 2021-04-09 | Disposition: A | Discharge status: Home / Self Care | Payer: Medicare Other | Source: Ambulatory Visit | Attending: Internal Medicine | Admitting: Internal Medicine

## 2021-04-09 DIAGNOSIS — Z560 Unemployment, unspecified: Secondary | ICD-10-CM | POA: Diagnosis not present

## 2021-04-09 DIAGNOSIS — Z87738 Personal history of other specified (corrected) congenital malformations of digestive system: Secondary | ICD-10-CM | POA: Diagnosis not present

## 2021-04-09 DIAGNOSIS — Z791 Long term (current) use of non-steroidal anti-inflammatories (NSAID): Secondary | ICD-10-CM | POA: Diagnosis not present

## 2021-04-09 DIAGNOSIS — L299 Pruritus, unspecified: Secondary | ICD-10-CM | POA: Diagnosis not present

## 2021-04-09 DIAGNOSIS — Z87891 Personal history of nicotine dependence: Secondary | ICD-10-CM | POA: Diagnosis not present

## 2021-04-09 DIAGNOSIS — K648 Other hemorrhoids: Secondary | ICD-10-CM | POA: Diagnosis not present

## 2021-04-09 DIAGNOSIS — K635 Polyp of colon: Secondary | ICD-10-CM | POA: Diagnosis not present

## 2021-04-09 DIAGNOSIS — K625 Hemorrhage of anus and rectum: Secondary | ICD-10-CM | POA: Diagnosis not present

## 2021-04-09 LAB — BASIC METABOLIC PANEL
Anion gap: 8 (ref 5–15)
BUN: 18 mg/dL (ref 6–20)
CO2: 28 mmol/L (ref 22–32)
Calcium: 10.1 mg/dL (ref 8.9–10.3)
Chloride: 100 mmol/L (ref 98–111)
Creatinine, Ser: 0.85 mg/dL (ref 0.44–1.00)
GFR, Estimated: >60 mL/min (ref >60)
Glucose, Bld: 76 mg/dL (ref 70–99)
Potassium: 3.7 mmol/L (ref 3.5–5.1)
Sodium: 136 mmol/L (ref 135–145)

## 2021-04-12 ENCOUNTER — Encounter (HOSPITAL_COMMUNITY): Payer: Self-pay

## 2021-04-12 ENCOUNTER — Other Ambulatory Visit: Payer: Self-pay

## 2021-04-12 ENCOUNTER — Ambulatory Visit (HOSPITAL_COMMUNITY): Payer: Medicare Other | Admitting: Anesthesiology

## 2021-04-12 ENCOUNTER — Encounter (HOSPITAL_COMMUNITY)
Admission: RE | Disposition: A | Discharge status: Home / Self Care | Payer: Self-pay | Source: Home / Self Care | Attending: Internal Medicine

## 2021-04-12 ENCOUNTER — Ambulatory Visit (HOSPITAL_COMMUNITY)
Admission: RE | Admit: 2021-04-12 | Discharge: 2021-04-12 | Disposition: A | Discharge status: Home / Self Care | Payer: Medicare Other | Attending: Internal Medicine | Admitting: Internal Medicine

## 2021-04-12 DIAGNOSIS — Z87738 Personal history of other specified (corrected) congenital malformations of digestive system: Secondary | ICD-10-CM | POA: Insufficient documentation

## 2021-04-12 DIAGNOSIS — K648 Other hemorrhoids: Secondary | ICD-10-CM | POA: Insufficient documentation

## 2021-04-12 DIAGNOSIS — Z791 Long term (current) use of non-steroidal anti-inflammatories (NSAID): Secondary | ICD-10-CM | POA: Diagnosis not present

## 2021-04-12 DIAGNOSIS — K625 Hemorrhage of anus and rectum: Secondary | ICD-10-CM | POA: Diagnosis not present

## 2021-04-12 DIAGNOSIS — Z560 Unemployment, unspecified: Secondary | ICD-10-CM | POA: Insufficient documentation

## 2021-04-12 DIAGNOSIS — L299 Pruritus, unspecified: Secondary | ICD-10-CM | POA: Diagnosis not present

## 2021-04-12 DIAGNOSIS — K635 Polyp of colon: Secondary | ICD-10-CM | POA: Diagnosis not present

## 2021-04-12 DIAGNOSIS — Z87891 Personal history of nicotine dependence: Secondary | ICD-10-CM | POA: Insufficient documentation

## 2021-04-12 HISTORY — PX: POLYPECTOMY: SHX5525

## 2021-04-12 HISTORY — PX: COLONOSCOPY WITH PROPOFOL: SHX5780

## 2021-04-12 SURGERY — COLONOSCOPY WITH PROPOFOL
Anesthesia: General

## 2021-04-12 MED ORDER — PROPOFOL 10 MG/ML IV BOLUS
INTRAVENOUS | Status: DC | PRN
  Administered 2021-04-12: 125 ug/kg/min via INTRAVENOUS
  Administered 2021-04-12: 100 mg via INTRAVENOUS

## 2021-04-12 MED ORDER — LACTATED RINGERS IV SOLN: INTRAVENOUS | Status: DC

## 2021-04-12 MED ORDER — STERILE WATER FOR IRRIGATION IR SOLN
Status: DC | PRN
  Administered 2021-04-12: 1.5 mL

## 2021-04-12 NOTE — Transfer of Care (Signed)
Immediate Anesthesia Transfer of Care Note  Patient: Toni Miller  Procedure(s) Performed: COLONOSCOPY WITH PROPOFOL (N/A ) POLYPECTOMY  Patient Location: Endoscopy Unit  Anesthesia Type:General  Level of Consciousness: awake, alert , oriented and patient cooperative  Airway & Oxygen Therapy: Patient Spontanous Breathing  Post-op Assessment: Report given to RN, Post -op Vital signs reviewed and stable and Patient moving all extremities  Post vital signs: Reviewed and stable  Last Vitals:  Vitals Value Taken Time  BP    Temp    Pulse    Resp    SpO2      Last Pain:  Vitals:   04/12/21 1102  TempSrc:   PainSc: 4       Patients Stated Pain Goal: 7 (33/74/45 1460)  Complications: No complications documented.

## 2021-04-12 NOTE — H&P (Signed)
Primary Care Physician:  [name], MD Primary Gastroenterologist:  Dr. Abbey Chatters  Pre-Procedure History & Physical: HPI:  Toni Miller is a 60 y.o. female is here for a colonoscopy to be performed for rectal burning and itching, rectal bleeding, with history of hemorrhoids on last colonoscopy in 2014. 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 History of prolapsing hemorrhoids  Past Medical History:  Diagnosis Date  . Anxiety   . Arthritis   . Cancer ([place]) 1993   abnormal pap treated at [facility]  . Constipation   . Depression   . GERD (gastroesophageal reflux disease)   . Glaucoma 1993  . History of kidney stones   . Hot flashes, menopausal 06/12/2011  . Hyperlipidemia   . Hypertension   . Neck pain 7/09   WITH BULGING DISC-----RECIEVING EPIDURALS   . Sinusitis     Past Surgical History:  Procedure Laterality Date  . ABDOMINAL EXPLORATION SURGERY  age 41   bowel obstruction, [facility]  . ABDOMINAL HYSTERECTOMY  2007   [facility], [facility]  . BILATERAL EYE SURGERY     for glaucoma  . BIOPSY  01/09/2019   Procedure: BIOPSY;  Surgeon: Danie Binder, MD;  Location: [facility];  Service: Endoscopy;;  gstric   . bone removed from under tongue    . BREAST BIOPSY Left    benign  . BREAST SURGERY Left 2004   left partial mastectomy, [facility]  . CATARACT EXTRACTION Right 06/04/2015  . CATARACT EXTRACTION W/PHACO  05/16/2011   Procedure: CATARACT EXTRACTION PHACO AND INTRAOCULAR LENS PLACEMENT (IOC);  Surgeon: Tonny Branch;  Location: [facility] ORS;  Service: Ophthalmology;  Laterality: Right;  . COLONOSCOPY N/A 05/17/2013   Dr. Barnie Alderman diverticulosis was noted/small internal hemorrhoids  . CYST REMOVED LEFT BREAST /BENIGN  2005   left, [facility]  . ESOPHAGOGASTRODUODENOSCOPY N/A 01/09/2019   normal esophagus. Mild NSAID gastritis s/p biopsy. +H.pylori. treated with amoxicillin, biaxin, Nexium.Marland Kitchen ERADICATION DOCUMENTED  . EYE SURGERY  2010, 1992 approx   bilateral  . REPAIR IMPERFORATE ANUS /  ANORECTOPLASTY     per patient  . TOTAL ABDOMINAL HYSTERECTOMY W/ BILATERAL SALPINGOOPHORECTOMY  2007   [facility], [facility]  . TUBAL LIGATION  1991    Prior to Admission medications   Medication Sig Start Date End Date Taking? Authorizing Provider  calcium-vitamin D (OSCAL-500) 500-400 MG-UNIT tablet Take 1 tablet by mouth 2 (two) times daily. Patient taking differently: Take 1 tablet by mouth daily. 08/17/20  Yes [name], MD  cetirizine (ZYRTEC ALLERGY) 10 MG tablet Take 1 tablet (10 mg total) by mouth daily. 07/31/20  Yes Avegno, Darrelyn Hillock, FNP  cholecalciferol (VITAMIN D3) 25 MCG (1000 UT) tablet Take 1,000 Units by mouth daily.   Yes [provider]  conjugated estrogens (PREMARIN) vaginal cream Apply sparingly with fingertip three times weekly Patient taking differently: Place 1 Applicatorful vaginally 3 (three) times a week. Apply sparingly with fingertip three times weekly 10/09/19  Yes [name], MD  cyclobenzaprine (FLEXERIL) 5 MG tablet Take 1 tablet (5 mg total) by mouth 2 (two) times daily as needed for muscle spasms. 01/04/21  Yes Lindell Spar, MD  cycloSPORINE (RESTASIS) 0.05 % ophthalmic emulsion Place 1 drop into both eyes 2 (two) times daily.   Yes [provider]  esomeprazole (NEXIUM) 40 MG capsule TAKE ONE CAPSULE BY MOUTH TWICE DAILY. Patient taking differently: Take 40 mg by mouth daily. TAKE ONE CAPSULE BY MOUTH once daily 01/12/21  Yes Roseanne Kaufman  W, NP  fluticasone (FLONASE) 50 MCG/ACT nasal spray Place 1 spray into both nostrils daily as needed for allergies or rhinitis.   Yes [provider]  gabapentin (NEURONTIN) 300 MG capsule TAKE 1 CAPSULE BY MOUTH TWICE DAILY. Patient taking differently: Take 300 mg by mouth 2 (two) times daily. 03/22/21  Yes [name], MD  hydrocortisone (ANUSOL-HC) 2.5 % rectal cream Place 1 application rectally 2 (two) times daily. For rectal itching and burning Patient taking differently: Place  1 application rectally daily. For rectal itching and burning 03/02/21  Yes Annitta Needs, NP  hydrOXYzine (ATARAX/VISTARIL) 10 MG tablet Take 10 mg by mouth daily as needed for anxiety. 12/19/20  Yes [provider]  linaclotide (LINZESS) 145 MCG CAPS capsule Take 145 mcg by mouth every other day.   Yes [provider]  montelukast (SINGULAIR) 10 MG tablet Take 1 tablet (10 mg total) by mouth daily. 04/15/20  Yes [name], MD  Multiple Vitamin (MULTIVITAMIN) capsule Take 1 capsule by mouth daily.   Yes [provider]  naproxen (NAPROSYN) 500 MG tablet Take 500 mg by mouth daily.   Yes [provider]  ondansetron (ZOFRAN) 4 MG tablet Take 1 tablet (4 mg total) by mouth every 6 (six) hours. Patient taking differently: Take 4 mg by mouth every 8 (eight) hours as needed for nausea or vomiting. 03/07/21  Yes Scot Jun, FNP  polyethylene glycol powder (GLYCOLAX/MIRALAX) powder MIX 1 CAPFUL (17G) IN 8 OUNCES OF JUICE/WATER AND DRINK ONCE DAILY. Patient taking differently: Take 17 g by mouth as needed for moderate constipation. MIX 1 CAPFUL (17G) IN 8 OUNCES OF JUICE/WATER AND DRINK ONCE DAILY. 01/04/17  Yes [name], MD  rosuvastatin (CRESTOR) 10 MG tablet TAKE (1) TABLET BY MOUTH AT BEDTIME. 04/07/21  Yes [name], MD  TRAVATAN Z 0.004 % SOLN ophthalmic solution Place 1 drop into both eyes at bedtime. 02/16/16  Yes [provider]  triamcinolone cream (KENALOG) 0.1 % Apply 1 application topically 2 (two) times daily. Patient taking differently: Apply 1 application topically 2 (two) times daily as needed (irritation). 01/20/20  Yes Corum, Rex Kras, MD  triamterene-hydrochlorothiazide (MAXZIDE-25) 37.5-25 MG tablet TAKE 1 & 1/2 TABLETS DAILY FOR BLOOD PRESSURE. Patient taking differently: Take 1.5 tablets by mouth daily. TAKE 1 & 1/2 TABLETS DAILY FOR BLOOD PRESSURE. 10/05/20  Yes [name], MD  venlafaxine XR (EFFEXOR XR)  37.5 MG 24 hr capsule Take 1 capsule (37.5 mg total) by mouth daily with breakfast. 03/10/21 06/08/21 Yes Hisada, Elie Goody, MD  albuterol (VENTOLIN HFA) 108 (90 Base) MCG/ACT inhaler Inhale 2 puffs into the lungs every 6 (six) hours as needed for wheezing or shortness of breath.    [provider]  neomycin-polymyxin-hydrocortisone (CORTISPORIN) OTIC solution Apply 1-2 drops to toe after soaking BID Patient not taking: Reported on 03/31/2021 03/26/20   Wallene Huh, DPM    Allergies as of 03/02/2021 - Review Complete 03/02/2021  Allergen Reaction Noted  . Acyclovir and related Rash 03/02/2015    Family History  Adopted: Yes  Problem Relation Age of Onset  . Alcohol abuse Mother   . Stomach cancer Mother 20  . Lung cancer Father   . Glaucoma Father   . Alcohol abuse Father   . Seizures Father   . Breast cancer Sister 5  . Breast cancer Sister 24  . Heart disease Sister   . Heart attack Sister   . Glaucoma Son   .  Glaucoma Maternal Grandfather   . Cancer Maternal Grandfather        poss leukemia  . SIDS Brother   . Hyperthyroidism Daughter   . Seizures Son        as a Sport and exercise psychologist  . Kidney disease Maternal Grandmother   . Heart disease Maternal Grandmother        Psychologist, forensic  . Leukemia Maternal Grandmother   . Colon cancer Neg Hx     Social History   Socioeconomic History  . Marital status: Significant Other    Spouse name: Not on file  . Number of children: 3  . Years of education: 55  . Highest education level: 12th grade  Occupational History  . Occupation: UNEMPLOYED 2011    Comment: disability    Employer: DISABLED  Tobacco Use  . Smoking status: Former Smoker    Packs/day: 3.00    Years: 2.00    Pack years: 6.00    Types: Cigarettes    Quit date: 05/12/1991    Years since quitting: 29.9  . Smokeless tobacco: Never Used  Vaping Use  . Vaping Use: Never used  Substance and Sexual Activity  . Alcohol use: Yes    Comment: occasionally  . Drug use: Never  .  Sexual activity: Yes    Birth control/protection: Surgical  Other Topics Concern  . Not on file  Social History Narrative   Lives 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   caffien- coffee, 1 cup daily   Social Determinants of Health   Financial Resource Strain: Low Risk   . Difficulty of Paying Living Expenses: Not very hard  Food Insecurity: No Food Insecurity  . Worried About Charity fundraiser in the Last Year: Never true  . Ran Out of Food in the Last Year: Never true  Transportation Needs: No Transportation Needs  . Lack of Transportation (Medical): No  . Lack of Transportation (Non-Medical): No  Physical Activity: Insufficiently Active  . Days of Exercise per Week: 1 day  . Minutes of Exercise per Session: 30 min  Stress: No Stress Concern Present  . Feeling of Stress : Only a little  Social Connections: Moderately Isolated  . Frequency of Communication with Friends and Family: Three times a week  . Frequency of Social Gatherings with Friends and Family: Never  . Attends Religious Services: More than 4 times per year  . Active Member of Clubs or Organizations: No  . Attends Archivist Meetings: Never  . Marital Status: Divorced  Human resources officer Violence: Not At Risk  . Fear of Current or Ex-Partner: No  . Emotionally Abused: No  . Physically Abused: No  . Sexually Abused: No    Review of Systems: See HPI, otherwise negative ROS  Physical Exam: Vital signs in last 24 hours: Temp:  [98.7 F (37.1 C)] 98.7 F (37.1 C) (06/06 0938) Pulse Rate:  [98] 98 (06/06 0938) Resp:  [18] 18 (06/06 0938) BP: (127)/(80) 127/80 (06/06 0938) SpO2:  [100 %] 100 % (06/06 0938) Weight:  [58.5 kg] 58.5 kg (06/06 0938)   General:   Alert,  Well-developed, well-nourished, pleasant and cooperative in NAD Head:  Normocephalic and atraumatic. Eyes:  Sclera clear, no icterus.   Conjunctiva pink. Ears:  Normal auditory acuity. Nose:  No deformity, discharge,  or  lesions. Mouth:  No deformity or lesions, dentition normal. Neck:  Supple; no masses or thyromegaly. Lungs:  Clear throughout to auscultation.   No wheezes, crackles, or rhonchi.  No acute distress. Heart:  Regular rate and rhythm; no murmurs, clicks, rubs,  or gallops. Abdomen:  Soft, nontender and nondistended. No masses, hepatosplenomegaly or hernias noted. Normal bowel sounds, without guarding, and without rebound.   Msk:  Symmetrical without gross deformities. Normal posture. Extremities:  Without clubbing or edema. Neurologic:  Alert and  oriented x4;  grossly normal neurologically. Skin:  Intact without significant lesions or rashes. Cervical Nodes:  No significant cervical adenopathy. Psych:  Alert and cooperative. Normal mood and affect.  Impression/Plan: KANIJA REMMEL is here for a colonoscopy to be performed for rectal burning and itching, rectal bleeding, with history of hemorrhoids on last colonoscopy in 2014. 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 History of prolapsing hemorrhoids.   The risks of the procedure including infection, bleed, or perforation as well as benefits, limitations, alternatives and imponderables have been reviewed with the patient. Questions have been answered. All parties agreeable.

## 2021-04-12 NOTE — Discharge Instructions (Addendum)
Colonoscopy Discharge Instructions  Read the instructions outlined below and refer to this sheet in the next few weeks. These discharge instructions provide you with general information on caring for yourself after you leave the hospital. Your doctor may also give you specific instructions. While your treatment has been planned according to the most current medical practices available, unavoidable complications occasionally occur.   ACTIVITY  You may resume your regular activity, but move at a slower pace for the next 24 hours.   Take frequent rest periods for the next 24 hours.   Walking will help get rid of the air and reduce the bloated feeling in your belly (abdomen).   No driving for 24 hours (because of the medicine (anesthesia) used during the test).    Do not sign any important legal documents or operate any machinery for 24 hours (because of the anesthesia used during the test).  NUTRITION  Drink plenty of fluids.   You may resume your normal diet as instructed by your doctor.   Begin with a light meal and progress to your normal diet. Heavy or fried foods are harder to digest and may make you feel sick to your stomach (nauseated).   Avoid alcoholic beverages for 24 hours or as instructed.  MEDICATIONS  You may resume your normal medications unless your doctor tells you otherwise.  WHAT YOU CAN EXPECT TODAY  Some feelings of bloating in the abdomen.   Passage of more gas than usual.   Spotting of blood in your stool or on the toilet paper.  IF YOU HAD POLYPS REMOVED DURING THE COLONOSCOPY:  No aspirin products for 7 days or as instructed.   No alcohol for 7 days or as instructed.   Eat a soft diet for the next 24 hours.  FINDING OUT THE RESULTS OF YOUR TEST Not all test results are available during your visit. If your test results are not back during the visit, make an appointment with your caregiver to find out the results. Do not assume everything is normal if  you have not heard from your caregiver or the medical facility. It is important for you to follow up on all of your test results.  SEEK IMMEDIATE MEDICAL ATTENTION IF:  You have more than a spotting of blood in your stool.   Your belly is swollen (abdominal distention).   You are nauseated or vomiting.   You have a temperature over 101.   You have abdominal pain or discomfort that is severe or gets worse throughout the day.   Your colonoscopy revealed 1 polyp(s) which I removed successfully. Await pathology results, my office will contact you. I recommend repeating colonoscopy in 5 years for surveillance purposes. You also have internal hemorrhoids. I would recommend increasing fiber in your diet or adding OTC Benefiber/Metamucil. Be sure to drink at least 4 to 6 glasses of water daily. Follow-up with Roseanne Kaufman next available to discuss hemorrhoid banding.    I hope you have a great rest of your week!  Elon Alas. Abbey Chatters, D.O. Gastroenterology and Hepatology [facility]   Colon Polyps  Colon polyps are tissue growths inside the colon, which is part of the large intestine. They are one of the types of polyps that can grow in the body. A polyp may be a round bump or a mushroom-shaped growth. You could have one polyp or more than one. Most colon polyps are noncancerous (benign). However, some colon polyps can become cancerous over time. Finding and removing  the polyps early can help prevent this. What are the causes? The exact cause of colon polyps is not known. What increases the risk? The following factors may make you more likely to develop this condition:  Having a family history of colorectal cancer or colon polyps.  Being older than 58 years of age.  Being younger than 58 years of age and having a significant family history of colorectal cancer or colon polyps or a genetic condition that puts you at higher risk of getting colon polyps.  Having  inflammatory bowel disease, such as ulcerative colitis or Crohn's disease.  Having certain conditions passed from parent to child (hereditary conditions), such as: ? Familial adenomatous polyposis (FAP). ? Lynch syndrome. ? Turcot syndrome. ? Peutz-Jeghers syndrome. ? MUTYH-associated polyposis (MAP).  Being overweight.  Certain lifestyle factors. These include smoking cigarettes, drinking too much alcohol, not getting enough exercise, and eating a diet that is high in fat and red meat and low in fiber.  Having had childhood cancer that was treated with radiation of the abdomen. What are the signs or symptoms? Many times, there are no symptoms. If you have symptoms, they may include:  Blood coming from the rectum during a bowel movement.  Blood in the stool (feces). The blood may be bright red or very dark in color.  Pain in the abdomen.  A change in bowel habits, such as constipation or diarrhea. How is this diagnosed? This condition is diagnosed with a colonoscopy. This is a procedure in which a lighted, flexible scope is inserted into the opening between the buttocks (anus) and then passed into the colon to examine the area. Polyps are sometimes found when a colonoscopy is done as part of routine cancer screening tests. How is this treated? This condition is treated by removing any polyps that are found. Most polyps can be removed during a colonoscopy. Those polyps will then be tested for cancer. Additional treatment may be needed depending on the results of testing. Follow these instructions at home: Eating and drinking  Eat foods that are high in fiber, such as fruits, vegetables, and whole grains.  Eat foods that are high in calcium and vitamin D, such as milk, cheese, yogurt, eggs, liver, fish, and broccoli.  Limit foods that are high in fat, such as fried foods and desserts.  Limit the amount of red meat, precooked or cured meat, or other processed meat that you eat,  such as hot dogs, sausages, bacon, or meat loaves.  Limit sugary drinks.   Lifestyle  Maintain a healthy weight, or lose weight if recommended by your health care provider.  Exercise every day or as told by your health care provider.  Do not use any products that contain nicotine or tobacco, such as cigarettes, e-cigarettes, and chewing tobacco. If you need help quitting, ask your health care provider.  Do not drink alcohol if: ? Your health care provider tells you not to drink. ? You are pregnant, may be pregnant, or are planning to become pregnant.  If you drink alcohol: ? Limit how much you use to:  0-1 drink a day for women.  0-2 drinks a day for men. ? Know how much alcohol is in your drink. In the [place] one drink equals one 12 oz bottle of beer (355 mL), one 5 oz glass of wine (148 mL), or one 1 oz glass of hard liquor (44 mL). General instructions  Take over-the-counter and prescription medicines only as told by your health care  provider.  Keep all follow-up visits. This is important. This includes having regularly scheduled colonoscopies. Talk to your health care provider about when you need a colonoscopy. Contact a health care provider if:  You have new or worsening bleeding during a bowel movement.  You have new or increased blood in your stool.  You have a change in bowel habits.  You lose weight for no known reason. Summary  Colon polyps are tissue growths inside the colon, which is part of the large intestine. They are one type of polyp that can grow in the body.  Most colon polyps are noncancerous (benign), but some can become cancerous over time.  This condition is diagnosed with a colonoscopy.  This condition is treated by removing any polyps that are found. Most polyps can be removed during a colonoscopy. This information is not intended to replace advice given to you by your health care provider. Make sure you discuss any questions you have with your  health care provider. Document Revised: 02/12/2020 Document Reviewed: 02/12/2020 Elsevier Patient Education  2021 [employer].

## 2021-04-12 NOTE — Anesthesia Postprocedure Evaluation (Signed)
Anesthesia Post Note  Patient: Toni Miller  Procedure(s) Performed: COLONOSCOPY WITH PROPOFOL (N/A ) POLYPECTOMY  Patient location during evaluation: Endoscopy Anesthesia Type: General Level of consciousness: awake and alert and oriented Pain management: pain level controlled Vital Signs Assessment: post-procedure vital signs reviewed and stable Respiratory status: spontaneous breathing and respiratory function stable Cardiovascular status: blood pressure returned to baseline and stable Postop Assessment: no apparent nausea or vomiting Anesthetic complications: no   No complications documented.   Last Vitals:  Vitals:   04/12/21 0938 04/12/21 1117  BP: 127/80 92/62  Pulse: 98   Resp: 18 16  Temp: 37.1 C 37.1 C  SpO2: 100% 98%    Last Pain:  Vitals:   04/12/21 1117  TempSrc: Oral  PainSc: 0-No pain                 Damika Harmon C Naelle Diegel

## 2021-04-12 NOTE — Anesthesia Preprocedure Evaluation (Addendum)
Anesthesia Evaluation  Patient identified by MRN, date of birth, ID band Patient awake    Reviewed: Allergy & Precautions, NPO status , Patient's Chart, lab work & pertinent test results  Airway Mallampati: III  TM Distance: >3 FB Neck ROM: Full   Comment: Neck pain, oral sx  Dental  (+) Dental Advisory Given, Teeth Intact   Pulmonary asthma , former smoker,    Pulmonary exam normal breath sounds clear to auscultation       Cardiovascular Exercise Tolerance: Good hypertension, Pt. on medications Normal cardiovascular exam Rhythm:Regular Rate:Normal     Neuro/Psych PSYCHIATRIC DISORDERS Anxiety Depression  Neuromuscular disease    GI/Hepatic Neg liver ROS, GERD  Medicated and Controlled,  Endo/Other  negative endocrine ROS  Renal/GU negative Renal ROS     Musculoskeletal  (+) Arthritis  (neck pain, right shoulder pain),   Abdominal   Peds  Hematology negative hematology ROS (+)   Anesthesia Other Findings Glaucoma  Oral surgery  Reproductive/Obstetrics                           Anesthesia Physical Anesthesia Plan  ASA: II  Anesthesia Plan: General   Post-op Pain Management:    Induction: Intravenous  PONV Risk Score and Plan: Propofol infusion  Airway Management Planned: Nasal Cannula and Natural Airway  Additional Equipment:   Intra-op Plan:   Post-operative Plan:   Informed Consent: I have reviewed the patients History and Physical, chart, labs and discussed the procedure including the risks, benefits and alternatives for the proposed anesthesia with the patient or authorized representative who has indicated his/her understanding and acceptance.     Dental advisory given  Plan Discussed with: CRNA and Surgeon  Anesthesia Plan Comments:         Anesthesia Quick Evaluation

## 2021-04-12 NOTE — Op Note (Signed)
[facility] Patient Name: Toni Miller Procedure Date: 04/12/2021 10:50 AM MRN: [id number] Date of Birth: May 30, 1961 Attending MD: Elon Alas. Abbey Chatters DO CSN: [id number] Age: 60 Admit Type: Outpatient Procedure:                Colonoscopy Indications:              Rectal bleeding and itching Providers:                Elon Alas. Abbey Chatters, DO, Charlsie Quest. Theda Sers RN, RN,                            Randa Spike, Technician Referring MD:              Medicines:                See the Anesthesia note for documentation of the                            administered medications Complications:            No immediate complications. Estimated Blood Loss:     Estimated blood loss was minimal. Procedure:                Pre-Anesthesia Assessment:                           - The anesthesia plan was to use monitored                            anesthesia care (MAC).                           After obtaining informed consent, the colonoscope                            was passed under direct vision. Throughout the                            procedure, the patient's blood pressure, pulse, and                            oxygen saturations were monitored continuously. The                            [id number]-HQ190L ([id number]) scope was introduced through                            the anus and advanced to the the cecum, identified                            by appendiceal orifice and ileocecal valve. The                            colonoscopy was performed without difficulty. The                            patient tolerated the procedure well.  The quality                            of the bowel preparation was evaluated using the                            BBPS [facility] Bowel Preparation Scale) with scores                            of: Right Colon = 2 (minor amount of residual                            staining, small fragments of stool and/or opaque                            liquid, but mucosa seen  well), Transverse Colon = 2                            (minor amount of residual staining, small fragments                            of stool and/or opaque liquid, but mucosa seen                            well) and Left Colon = 2 (minor amount of residual                            staining, small fragments of stool and/or opaque                            liquid, but mucosa seen well). The total BBPS score                            equals 6. The quality of the bowel preparation was                            fair. 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      Internal hemorrhoids were found during endoscopy.      A 2 mm polyp was found in the cecum. The polyp was sessile. The polyp       was removed with a cold biopsy forceps. Resection and retrieval were       complete.      The exam was otherwise without abnormality. Impression:               - Preparation of the colon was fair.                           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                           - Internal hemorrhoids.                           - One 2 mm polyp in the cecum, removed with a cold                            biopsy forceps. Resected and retrieved.                           - The examination was otherwise normal. Moderate Sedation:      Per Anesthesia Care Recommendation:           - Patient has a contact number available for                            emergencies. The signs and symptoms of potential                            delayed complications were discussed with the                            patient. Return to normal  activities tomorrow.                            Written discharge instructions were provided to the                            patient.                           - Resume previous diet.                           - Continue present medications.                           - Await pathology results.                           - Repeat colonoscopy in 5 years for surveillance.                           - Return to GI clinic in 3 months with Vicente Males to                            discuss potential hemorrhoid banding. Procedure Code(s):        --- Professional ---                           [id number], Colonoscopy, flexible; with biopsy, single  or multiple Diagnosis Code(s):        --- Professional ---                           K64.8, Other hemorrhoids                           K63.5, Polyp of colon                           K62.5, Hemorrhage of anus and rectum CPT copyright 2019 American Medical Association. All rights reserved. The codes documented in this report are preliminary and upon coder review may  be revised to meet current compliance requirements. Elon Alas. Abbey Chatters, DO [place] Abbey Chatters, DO 04/12/2021 11:19:20 AM This report has been signed electronically. Number of Addenda: 0

## 2021-04-14 LAB — SURGICAL PATHOLOGY

## 2021-04-15 ENCOUNTER — Ambulatory Visit (HOSPITAL_COMMUNITY): Payer: Medicare Other

## 2021-04-20 ENCOUNTER — Ambulatory Visit: Payer: Medicare Other | Admitting: Orthopaedic Surgery

## 2021-04-20 ENCOUNTER — Encounter (HOSPITAL_COMMUNITY): Payer: Self-pay | Admitting: Internal Medicine

## 2021-04-22 ENCOUNTER — Telehealth: Payer: Self-pay

## 2021-04-22 NOTE — Telephone Encounter (Signed)
Per Dr. Abbey Chatters, please change recall to 10 years for colonoscopy.

## 2021-04-23 ENCOUNTER — Telehealth: Payer: Self-pay

## 2021-04-23 ENCOUNTER — Other Ambulatory Visit: Payer: Self-pay

## 2021-04-23 ENCOUNTER — Other Ambulatory Visit: Payer: Self-pay | Admitting: Family Medicine

## 2021-04-23 DIAGNOSIS — F419 Anxiety disorder, unspecified: Secondary | ICD-10-CM

## 2021-04-23 DIAGNOSIS — L309 Dermatitis, unspecified: Secondary | ICD-10-CM

## 2021-04-23 MED ORDER — HYDROXYZINE HCL 10 MG PO TABS: ORAL_TABLET | ORAL | 2 refills | Status: DC

## 2021-04-23 NOTE — Telephone Encounter (Signed)
Rx sent 

## 2021-04-23 NOTE — Telephone Encounter (Signed)
Patient needing refill on hydroxyzine 10mg  send to [place] [facility] ph# [phone]

## 2021-04-27 ENCOUNTER — Ambulatory Visit: Payer: Medicare Other | Admitting: Orthopaedic Surgery

## 2021-05-03 ENCOUNTER — Other Ambulatory Visit: Payer: Self-pay | Admitting: Family Medicine

## 2021-05-04 ENCOUNTER — Ambulatory Visit: Payer: Medicare Other | Admitting: Family Medicine

## 2021-05-11 ENCOUNTER — Other Ambulatory Visit: Payer: Self-pay

## 2021-05-11 ENCOUNTER — Ambulatory Visit (HOSPITAL_COMMUNITY)
Admission: RE | Admit: 2021-05-11 | Discharge: 2021-05-11 | Disposition: A | Discharge status: Home / Self Care | Payer: Medicare Other | Source: Ambulatory Visit | Attending: Orthopaedic Surgery | Admitting: Orthopaedic Surgery

## 2021-05-11 DIAGNOSIS — G8929 Other chronic pain: Secondary | ICD-10-CM | POA: Diagnosis not present

## 2021-05-11 DIAGNOSIS — M25561 Pain in right knee: Secondary | ICD-10-CM | POA: Diagnosis not present

## 2021-05-12 ENCOUNTER — Other Ambulatory Visit: Payer: Self-pay | Admitting: Family Medicine

## 2021-05-18 DIAGNOSIS — H401132 Primary open-angle glaucoma, bilateral, moderate stage: Secondary | ICD-10-CM | POA: Diagnosis not present

## 2021-05-27 ENCOUNTER — Other Ambulatory Visit: Payer: Self-pay | Admitting: Gastroenterology
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
## 2021-06-03 ENCOUNTER — Other Ambulatory Visit: Payer: Self-pay

## 2021-06-03 ENCOUNTER — Ambulatory Visit (INDEPENDENT_AMBULATORY_CARE_PROVIDER_SITE_OTHER): Payer: Medicare Other | Admitting: Gastroenterology

## 2021-06-03 ENCOUNTER — Encounter: Payer: Self-pay | Admitting: Gastroenterology

## 2021-06-03 VITALS — BP 122/84 | HR 77 | Temp 97.5°F | Ht 61.0 in | Wt 126.6 lb

## 2021-06-03 DIAGNOSIS — K642 Third degree hemorrhoids: Secondary | ICD-10-CM | POA: Diagnosis not present

## 2021-06-03 NOTE — Progress Notes (Signed)
[facility] Banding Note:   Pleasant 60 year old female presenting today with symptomatic hemorrhoids. Prior banding by Dr. Oneida Alar in the remote past. Now with recurrence. Recent colonoscopy on file with internal hemorrhoids. Linzess 145 mcg overshooting the mark. Notes bleeding, itching, and burning.   The patient presents with symptomatic grade 3 hemorrhoids, unresponsive to maximal medical therapy, requesting rubber band ligation of her hemorrhoidal disease. All risks, benefits, and alternative forms of therapy were described and informed consent was obtained.  The decision was made to band the left lateral internal hemorrhoid, and the [place] was used to perform band ligation without complication. Digital anorectal examination was then performed to assure proper positioning of the band, and to adjust the banded tissue as required. The patient was discharged home without pain or other issues. Dietary and behavioral recommendations were given and (if necessary prescriptions were given), along with follow-up instructions. We will decrease Linzess 145 mcg to 72 mcg daily. I have provided samples. The patient will return in next available for followup and possible additional banding as required.  No complications were encountered and the patient tolerated the procedure well  Toni Needs, PhD, [facility]

## 2021-06-03 NOTE — Patient Instructions (Signed)
Let's decrease Linzess dosage to 72 micrograms. Stop Linzess 145 mcg and start the 72 mcg samples. Take one each morning, 30 minutes before breakfast. Let me know how this works!  We will see you back for additional banding!  It's important to avoid straining, constipation, and limit toilet time to 2-3 minutes!  I enjoyed seeing you again today! As you know, I value our relationship and want to provide genuine, compassionate, and quality care. I welcome your feedback. If you receive a survey regarding your visit,  I greatly appreciate you taking time to fill this out. See you next time!  Annitta Needs, PhD, ANP-BC [facility]

## 2021-06-04 ENCOUNTER — Encounter: Payer: Self-pay | Admitting: Psychiatry

## 2021-06-04 ENCOUNTER — Telehealth (INDEPENDENT_AMBULATORY_CARE_PROVIDER_SITE_OTHER): Payer: Medicare Other | Admitting: Psychiatry

## 2021-06-04 DIAGNOSIS — F330 Major depressive disorder, recurrent, mild: Secondary | ICD-10-CM

## 2021-06-04 MED ORDER — VENLAFAXINE HCL ER 37.5 MG PO CP24: 37.5000 mg | ORAL_CAPSULE | Freq: Every day | ORAL | 0 refills | Status: DC

## 2021-06-04 NOTE — Patient Instructions (Signed)
1. Continue venlafaxine 37.5 mg daily  2. Next appointment: 10/27 at 8:40

## 2021-06-10 ENCOUNTER — Other Ambulatory Visit: Payer: Self-pay | Admitting: Family Medicine

## 2021-06-21 ENCOUNTER — Other Ambulatory Visit: Payer: Self-pay | Admitting: Family Medicine

## 2021-06-22 ENCOUNTER — Other Ambulatory Visit (HOSPITAL_COMMUNITY): Payer: Self-pay | Admitting: Family Medicine

## 2021-06-22 DIAGNOSIS — Z1231 Encounter for screening mammogram for malignant neoplasm of breast: Secondary | ICD-10-CM

## 2021-07-05 ENCOUNTER — Other Ambulatory Visit: Payer: Self-pay | Admitting: Family Medicine

## 2021-07-06 ENCOUNTER — Encounter: Payer: Self-pay | Admitting: Family Medicine

## 2021-07-06 ENCOUNTER — Other Ambulatory Visit: Payer: Self-pay

## 2021-07-06 ENCOUNTER — Ambulatory Visit (INDEPENDENT_AMBULATORY_CARE_PROVIDER_SITE_OTHER): Payer: Medicare Other | Admitting: Family Medicine

## 2021-07-06 VITALS — BP 133/85 | HR 84 | Temp 99.4°F | Resp 18 | Ht 61.0 in | Wt 125.0 lb

## 2021-07-06 DIAGNOSIS — Z0000 Encounter for general adult medical examination without abnormal findings: Secondary | ICD-10-CM | POA: Diagnosis not present

## 2021-07-06 DIAGNOSIS — E559 Vitamin D deficiency, unspecified: Secondary | ICD-10-CM

## 2021-07-06 DIAGNOSIS — Z23 Encounter for immunization: Secondary | ICD-10-CM

## 2021-07-06 DIAGNOSIS — R7302 Impaired glucose tolerance (oral): Secondary | ICD-10-CM

## 2021-07-06 DIAGNOSIS — G43809 Other migraine, not intractable, without status migrainosus: Secondary | ICD-10-CM | POA: Diagnosis not present

## 2021-07-06 DIAGNOSIS — E785 Hyperlipidemia, unspecified: Secondary | ICD-10-CM

## 2021-07-06 DIAGNOSIS — I10 Essential (primary) hypertension: Secondary | ICD-10-CM

## 2021-07-06 DIAGNOSIS — B369 Superficial mycosis, unspecified: Secondary | ICD-10-CM | POA: Diagnosis not present

## 2021-07-06 MED ORDER — BUTALBITAL-APAP-CAFFEINE 50-325-40 MG PO TABS: ORAL_TABLET | ORAL | 0 refills | Status: DC

## 2021-07-06 MED ORDER — CLOTRIMAZOLE-BETAMETHASONE 1-0.05 % EX CREA: 1.0000 "application " | TOPICAL_CREAM | Freq: Two times a day (BID) | CUTANEOUS | 1 refills | Status: DC

## 2021-07-06 NOTE — Progress Notes (Signed)
    Toni Miller     MRN: [id number]      DOB: 1960-12-17  HPI: Patient is in for annual physical exam. Immunization is reviewed , and  updated if needed. 4 day h/o right sided pounding headache slightly relieved by sleep but persists, no new neurologic symptoms C/o itchy rash on right buttocks, spreading x 3 weeks   PE: BP 133/85   Pulse 84   Temp 99.4 F (37.4 C)   Resp 18   Ht '5\' 1"'$  (1.549 m)   Wt 125 lb 0.6 oz (56.7 kg)   SpO2 97%   BMI 23.63 kg/m   Pleasant  female, alert and oriented x 3, in no cardio-pulmonary distress. Afebrile. HEENT No facial trauma or asymetry. Sinuses non tender.  Extra occullar muscles intact.. External ears normal, . Neck: supple, no adenopathy,JVD or thyromegaly.No bruits.  Chest: Clear to ascultation bilaterally.No crackles or wheezes. Non tender to palpation   Cardiovascular system; Heart sounds normal,  S1 and  S2 ,no S3.  No murmur, or thrill. Apical beat not displaced Peripheral pulses normal.  Abdomen: Soft, non tender, no organomegaly or masses. No bruits. Bowel sounds normal. No guarding, tenderness or rebound.     Musculoskeletal exam: Full ROM of spine, hips , shoulders and knees. No deformity ,swelling or crepitus noted. No muscle wasting or atrophy.   Neurologic: Cranial nerves 2 to 12 intact. Power, tone ,sensation and reflexes normal throughout. No disturbance in gait. No tremor.  Skin: Intact,fungal infection of right buttock, max diameter approx 6 cm  Psych; Normal mood and affect. Judgement and concentration normal   Assessment & Plan:  Annual physical exam Annual exam as documented. Counseling done  re healthy lifestyle involving commitment to 150 minutes exercise per week, heart healthy diet, and attaining healthy weight.The importance of adequate sleep also discussed. Regular seat belt use and home safety, is also discussed. Changes in health habits are decided on by the patient with goals  and time frames  set for achieving them. Immunization and cancer screening needs are specifically addressed at this visit.   Dermatomycosis Right buttock intermittent, rs topical med  Headache, variant migraine Current acute episode x 4 days, fioricet prescribed in limited quantity

## 2021-07-06 NOTE — Assessment & Plan Note (Signed)

## 2021-07-06 NOTE — Patient Instructions (Addendum)
F/U in 2 to 3 months, re evaluate headches and rash, call if you need me sooner  Flu vaccine today  Please get shingrix vaccines and covid booster at your pharmacy  Fasting cBC, lipid, cmp and eGFr, tSH, hBa1C and vitD this week please  Two New medications prescribed for your rash and for your headaches, they are at the pharmacy  Thanks for choosing [facility], we consider it a privelige to serve you.

## 2021-07-06 NOTE — Assessment & Plan Note (Signed)
Right buttock intermittent, rs topical med

## 2021-07-07 ENCOUNTER — Encounter: Payer: Self-pay | Admitting: Family Medicine

## 2021-07-07 DIAGNOSIS — R7302 Impaired glucose tolerance (oral): Secondary | ICD-10-CM | POA: Diagnosis not present

## 2021-07-07 DIAGNOSIS — G43809 Other migraine, not intractable, without status migrainosus: Secondary | ICD-10-CM | POA: Insufficient documentation

## 2021-07-07 DIAGNOSIS — E785 Hyperlipidemia, unspecified: Secondary | ICD-10-CM | POA: Diagnosis not present

## 2021-07-07 DIAGNOSIS — E559 Vitamin D deficiency, unspecified: Secondary | ICD-10-CM | POA: Diagnosis not present

## 2021-07-07 DIAGNOSIS — I10 Essential (primary) hypertension: Secondary | ICD-10-CM | POA: Diagnosis not present

## 2021-07-07 NOTE — Assessment & Plan Note (Addendum)
Current acute episode x 4 days, fioricet prescribed in limited quantity, call if persists

## 2021-07-08 LAB — CMP14+EGFR
ALT: 18 IU/L (ref 0–32)
AST: 16 IU/L (ref 0–40)
Albumin/Globulin Ratio: 1.8 (ref 1.2–2.2)
Albumin: 4.4 g/dL (ref 3.8–4.9)
Alkaline Phosphatase: 70 IU/L (ref 44–121)
BUN/Creatinine Ratio: 17 (ref 9–23)
BUN: 15 mg/dL (ref 6–24)
Bilirubin Total: 0.3 mg/dL (ref 0.0–1.2)
CO2: 27 mmol/L (ref 20–29)
Calcium: 9.2 mg/dL (ref 8.7–10.2)
Chloride: 101 mmol/L (ref 96–106)
Creatinine, Ser: 0.86 mg/dL (ref 0.57–1.00)
Globulin, Total: 2.5 g/dL (ref 1.5–4.5)
Glucose: 82 mg/dL (ref 65–99)
Potassium: 3.6 mmol/L (ref 3.5–5.2)
Sodium: 141 mmol/L (ref 134–144)
Total Protein: 6.9 g/dL (ref 6.0–8.5)
eGFR: 78 mL/min/{1.73_m2} (ref >59)

## 2021-07-08 LAB — HEMOGLOBIN A1C
Est. average glucose Bld gHb Est-mCnc: 108 mg/dL
Hgb A1c MFr Bld: 5.4 % (ref 4.8–5.6)

## 2021-07-08 LAB — CBC
Hematocrit: 39.6 % (ref 34.0–46.6)
Hemoglobin: 13.6 g/dL (ref 11.1–15.9)
MCH: 29.8 pg (ref 26.6–33.0)
MCHC: 34.3 g/dL (ref 31.5–35.7)
MCV: 87 fL (ref 79–97)
Platelets: 251 10*3/uL (ref 150–450)
RBC: 4.56 x10E6/uL (ref 3.77–5.28)
RDW: 12.8 % (ref 11.7–15.4)
WBC: 5.7 10*3/uL (ref 3.4–10.8)

## 2021-07-08 LAB — VITAMIN D 25 HYDROXY (VIT D DEFICIENCY, FRACTURES): Vit D, 25-Hydroxy: 49.7 ng/mL (ref 30.0–100.0)

## 2021-07-08 LAB — LIPID PANEL
Chol/HDL Ratio: 2.5 ratio (ref 0.0–4.4)
Cholesterol, Total: 161 mg/dL (ref 100–199)
HDL: 64 mg/dL (ref >39)
LDL Chol Calc (NIH): 81 mg/dL (ref 0–99)
Triglycerides: 85 mg/dL (ref 0–149)
VLDL Cholesterol Cal: 16 mg/dL (ref 5–40)

## 2021-07-08 LAB — TSH: TSH: 1.77 u[IU]/mL (ref 0.450–4.500)

## 2021-07-26 ENCOUNTER — Other Ambulatory Visit: Payer: Self-pay | Admitting: Family Medicine

## 2021-07-26 ENCOUNTER — Ambulatory Visit (HOSPITAL_COMMUNITY)
Admission: RE | Admit: 2021-07-26 | Discharge: 2021-07-26 | Disposition: A | Discharge status: Home / Self Care | Payer: Medicare Other | Source: Ambulatory Visit | Attending: Family Medicine | Admitting: Family Medicine

## 2021-07-26 ENCOUNTER — Other Ambulatory Visit: Payer: Self-pay

## 2021-07-26 DIAGNOSIS — Z1231 Encounter for screening mammogram for malignant neoplasm of breast: Secondary | ICD-10-CM | POA: Insufficient documentation

## 2021-08-12 ENCOUNTER — Other Ambulatory Visit: Payer: Self-pay | Admitting: Internal Medicine

## 2021-08-12 DIAGNOSIS — G8929 Other chronic pain: Secondary | ICD-10-CM

## 2021-08-25 ENCOUNTER — Encounter: Payer: Self-pay | Admitting: Gastroenterology

## 2021-08-25 ENCOUNTER — Encounter: Payer: Medicare Other | Admitting: Gastroenterology
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
## 2021-09-02 ENCOUNTER — Other Ambulatory Visit: Payer: Self-pay | Admitting: Family Medicine

## 2021-09-02 ENCOUNTER — Telehealth (INDEPENDENT_AMBULATORY_CARE_PROVIDER_SITE_OTHER): Payer: Medicare Other | Admitting: Psychiatry

## 2021-09-02 ENCOUNTER — Other Ambulatory Visit: Payer: Self-pay

## 2021-09-02 ENCOUNTER — Encounter: Payer: Self-pay | Admitting: Psychiatry

## 2021-09-02 DIAGNOSIS — F3341 Major depressive disorder, recurrent, in partial remission: Secondary | ICD-10-CM

## 2021-09-02 MED ORDER — VENLAFAXINE HCL ER 37.5 MG PO CP24: 37.5000 mg | ORAL_CAPSULE | Freq: Every day | ORAL | 0 refills | Status: DC

## 2021-09-02 NOTE — Patient Instructions (Signed)
1. Continue venlafaxine 37.5 mg daily  2. Next appointment: 1/25 at 8 AM

## 2021-09-07 ENCOUNTER — Other Ambulatory Visit: Payer: Self-pay

## 2021-09-07 ENCOUNTER — Ambulatory Visit (INDEPENDENT_AMBULATORY_CARE_PROVIDER_SITE_OTHER): Payer: Medicare Other | Admitting: Family Medicine

## 2021-09-07 ENCOUNTER — Ambulatory Visit (INDEPENDENT_AMBULATORY_CARE_PROVIDER_SITE_OTHER): Payer: Medicare Other

## 2021-09-07 ENCOUNTER — Encounter: Payer: Self-pay | Admitting: Family Medicine

## 2021-09-07 VITALS — BP 103/72 | HR 95 | Temp 99.3°F | Ht 61.0 in | Wt 126.0 lb

## 2021-09-07 DIAGNOSIS — M8580 Other specified disorders of bone density and structure, unspecified site: Secondary | ICD-10-CM | POA: Diagnosis not present

## 2021-09-07 DIAGNOSIS — M25552 Pain in left hip: Secondary | ICD-10-CM | POA: Diagnosis not present

## 2021-09-07 DIAGNOSIS — I10 Essential (primary) hypertension: Secondary | ICD-10-CM | POA: Diagnosis not present

## 2021-09-07 DIAGNOSIS — Z0000 Encounter for general adult medical examination without abnormal findings: Secondary | ICD-10-CM | POA: Diagnosis not present

## 2021-09-07 DIAGNOSIS — M25561 Pain in right knee: Secondary | ICD-10-CM | POA: Diagnosis not present

## 2021-09-07 DIAGNOSIS — G8929 Other chronic pain: Secondary | ICD-10-CM

## 2021-09-07 DIAGNOSIS — F3341 Major depressive disorder, recurrent, in partial remission: Secondary | ICD-10-CM

## 2021-09-07 DIAGNOSIS — E785 Hyperlipidemia, unspecified: Secondary | ICD-10-CM

## 2021-09-07 MED ORDER — METHYLPREDNISOLONE ACETATE 80 MG/ML IJ SUSP
80.0000 mg | Freq: Once | INTRAMUSCULAR | Status: AC
  Administered 2021-09-07: 80 mg via INTRAMUSCULAR

## 2021-09-07 MED ORDER — KETOROLAC TROMETHAMINE 60 MG/2ML IM SOLN
60.0000 mg | Freq: Once | INTRAMUSCULAR | Status: AC
  Administered 2021-09-07: 60 mg via INTRAMUSCULAR

## 2021-09-07 MED ORDER — PREDNISONE 5 MG PO TABS: 5.0000 mg | ORAL_TABLET | Freq: Two times a day (BID) | ORAL | 0 refills | Status: AC

## 2021-09-07 NOTE — Progress Notes (Signed)
Subjective:   Toni Miller is a 60 y.o. female who presents for Medicare Annual (Subsequent) preventive examination.   I connected with  [name] on 09/07/21 by a audio enabled telemedicine application and verified that I am speaking with the correct person using two identifiers.   I discussed the limitations, risks, security and privacy concerns of performing an evaluation and management service by telephone and the availability of in person appointments. I also discussed with the patient that there may be a patient responsible charge related to this service. The patient expressed understanding and verbally consented to this telephonic visit.  Review of Systems           Objective:    There were no vitals filed for this visit. There is no height or weight on file to calculate BMI.  Advanced Directives 04/12/2021 09/02/2020 06/29/2020 08/30/2019 01/09/2019 08/27/2018 02/16/2018  Does Patient Have a Medical Advance Directive? Yes No No No No No No  Type of Advance Directive Living will - - - - - -  Would patient like information on creating a medical advance directive? - No - Patient declined - No - Patient declined Yes (MAU/Ambulatory/Procedural Areas - Information given) Yes (ED - Information included in AVS) -  Pre-existing out of facility DNR order (yellow form or pink MOST form) - - - - - - -    Current Medications (verified) Outpatient Encounter Medications as of 09/07/2021  Medication Sig   albuterol (VENTOLIN HFA) 108 (90 Base) MCG/ACT inhaler Inhale 2 puffs into the lungs every 6 (six) hours as needed for wheezing or shortness of breath.   butalbital-acetaminophen-caffeine (FIORICET) 50-325-40 MG tablet Take one tablet by mouth twice daily, as needed, for headache   calcium-vitamin D (OSCAL-500) 500-400 MG-UNIT tablet Take 1 tablet by mouth 2 (two) times daily. (Patient not taking: Reported on 07/06/2021)   cetirizine (ZYRTEC ALLERGY) 10 MG tablet Take 1 tablet (10 mg  total) by mouth daily.   cholecalciferol (VITAMIN D3) 25 MCG (1000 UT) tablet Take 1,000 Units by mouth daily. (Patient not taking: Reported on 07/06/2021)   clotrimazole-betamethasone (LOTRISONE) cream Apply 1 application topically 2 (two) times daily.   conjugated estrogens (PREMARIN) vaginal cream Apply sparingly with fingertip three times weekly (Patient taking differently: Place 1 Applicatorful vaginally 2 (two) times a week. Apply sparingly with fingertip three times weekly)   cyclobenzaprine (FLEXERIL) 5 MG tablet TAKE 1 TABLET BY MOUTH TWICE A DAY AS NEEDED FOR MUSCLE SPASMS.   cycloSPORINE (RESTASIS) 0.05 % ophthalmic emulsion Place 1 drop into both eyes 2 (two) times daily.   esomeprazole (NEXIUM) 40 MG capsule TAKE ONE CAPSULE BY MOUTH TWICE DAILY. (Patient taking differently: Take 40 mg by mouth 2 (two) times daily. TAKE ONE CAPSULE BY MOUTH once daily)   fluticasone (FLONASE) 50 MCG/ACT nasal spray Place 1 spray into both nostrils daily as needed for allergies or rhinitis.   gabapentin (NEURONTIN) 300 MG capsule TAKE 1 CAPSULE BY MOUTH TWICE DAILY.   hydrocortisone (ANUSOL-HC) 2.5 % rectal cream APPLY (1) APPLICATION TWICE DAILY ( FOR RECTAL ITCHING AND BURNING)   hydrOXYzine (ATARAX/VISTARIL) 10 MG tablet TAKE ONE TABLET BY MOUTH ONCE DAILY AS NEEDED FOR ITCHING.   linaclotide (LINZESS) 145 MCG CAPS capsule Take 145 mcg by mouth every other day.   montelukast (SINGULAIR) 10 MG tablet TAKE ONE TABLET BY MOUTH DAILY   Multiple Vitamin (MULTIVITAMIN) capsule Take 1 capsule by mouth daily.   naproxen (NAPROSYN) 500 MG tablet Take 500 mg  by mouth daily. (Patient not taking: Reported on 07/06/2021)   neomycin-polymyxin-hydrocortisone (CORTISPORIN) OTIC solution Apply 1-2 drops to toe after soaking BID (Patient not taking: Reported on 07/06/2021)   NON FORMULARY Acetaminophen/asprin/caffeine (Headache relief)   polyethylene glycol powder (GLYCOLAX/MIRALAX) powder MIX 1 CAPFUL (17G) IN 8 OUNCES  OF JUICE/WATER AND DRINK ONCE DAILY. (Patient taking differently: Take 17 g by mouth as needed for moderate constipation. MIX 1 CAPFUL (17G) IN 8 OUNCES OF JUICE/WATER AND DRINK ONCE DAILY.)   Propylene Glycol (SYSTANE BALANCE) 0.6 % SOLN Apply to eye.   rosuvastatin (CRESTOR) 10 MG tablet TAKE (1) TABLET BY MOUTH AT BEDTIME.   TRAVATAN Z 0.004 % SOLN ophthalmic solution Place 1 drop into both eyes at bedtime.   triamterene-hydrochlorothiazide (MAXZIDE-25) 37.5-25 MG tablet TAKE 1 & 1/2 TABLETS DAILY FOR BLOOD PRESSURE.   [START ON 09/08/2021] venlafaxine XR (EFFEXOR XR) 37.5 MG 24 hr capsule Take 1 capsule (37.5 mg total) by mouth daily with breakfast.   No facility-administered encounter medications on file as of 09/07/2021.    Allergies (verified) Acyclovir and related   History: Past Medical History:  Diagnosis Date   Anxiety    Arthritis    Cancer ([place]) 1993   abnormal pap [facility] at [facility]   Constipation    Depression    GERD (gastroesophageal reflux disease)    Glaucoma 1993   History of kidney stones    Hot flashes, menopausal 06/12/2011   Hyperlipidemia    Hypertension    Neck pain 7/09   WITH BULGING DISC-----RECIEVING EPIDURALS    Sinusitis    Past Surgical History:  Procedure Laterality Date   ABDOMINAL EXPLORATION SURGERY  age 63   bowel obstruction, [facility]   ABDOMINAL HYSTERECTOMY  2007   [facility], [facility]   BILATERAL EYE SURGERY     for glaucoma   BIOPSY  01/09/2019   Procedure: BIOPSY;  Surgeon: Danie Binder, MD;  Location: [facility];  Service: Endoscopy;;  gstric    bone removed from under tongue     BREAST BIOPSY Left    benign   BREAST SURGERY Left 2004   left partial mastectomy, [facility]   CATARACT EXTRACTION Right 06/04/2015   CATARACT EXTRACTION W/PHACO  05/16/2011   Procedure: CATARACT EXTRACTION PHACO AND INTRAOCULAR LENS PLACEMENT ([place]);  Surgeon: Tonny Branch;  Location: [facility];  Service: Ophthalmology;  Laterality: Right;   COLONOSCOPY N/A 05/17/2013   Dr.  Barnie Alderman diverticulosis was noted/small internal hemorrhoids   COLONOSCOPY WITH PROPOFOL N/A 04/12/2021   Colonoscopy June 2022: small anal sphincter s/p rectal surgery, internal hemorrhoids, one 2 mm polyp in cecum (benign). 10 year surveillance.   CYST REMOVED LEFT BREAST /BENIGN  2005   left, [facility]   ESOPHAGOGASTRODUODENOSCOPY N/A 01/09/2019   normal esophagus. Mild NSAID gastritis s/p biopsy. +H.pylori. treated with amoxicillin, biaxin, Nexium.Marland Kitchen ERADICATION DOCUMENTED   EYE SURGERY  2010, 1992 approx   bilateral   POLYPECTOMY  04/12/2021   Procedure: POLYPECTOMY;  Surgeon: Eloise Harman, DO;  Location: [facility];  Service: Endoscopy;;   REPAIR IMPERFORATE ANUS / ANORECTOPLASTY     per patient   TOTAL ABDOMINAL HYSTERECTOMY W/ BILATERAL SALPINGOOPHORECTOMY  2007   [facility], [place]   TUBAL LIGATION  1991   Family History  Adopted: Yes  Problem Relation Age of Onset   Alcohol abuse Mother    Stomach cancer Mother 60   Lung cancer Father    Glaucoma Father    Alcohol abuse Father    Seizures  Father    Breast cancer Sister 74   Breast cancer Sister 30   Heart disease Sister    Heart attack Sister    Glaucoma Son    Glaucoma Maternal Grandfather    Cancer Maternal Grandfather        poss leukemia   SIDS Brother    Hyperthyroidism Daughter    Seizures Son        as a Sport and exercise psychologist   Kidney disease Maternal Grandmother    Heart disease Maternal Grandmother        Psychologist, forensic   Leukemia Maternal Grandmother    Colon cancer Neg Hx    Social History   Socioeconomic History   Marital status: Significant Other    Spouse name: Not on file   Number of children: 3   Years of education: 12   Highest education level: 12th grade  Occupational History   Occupation: UNEMPLOYED 2011    Comment: disability    Employer: DISABLED  Tobacco Use   Smoking status: Former    Packs/day: 3.00    Years: 2.00    Pack years: 6.00    Types: Cigarettes    Quit date: 05/12/1991    Years since  quitting: 30.3   Smokeless tobacco: Never  Vaping Use   Vaping Use: Never used  Substance and Sexual Activity   Alcohol use: Yes    Comment: occasionally   Drug use: Never   Sexual activity: Yes    Birth control/protection: Surgical  Other Topics Concern   Not on file  Social History Narrative   Lives 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   caffien- coffee, 1 cup daily   Social Determinants of Health   Financial Resource Strain: Not on file  Food Insecurity: Not on file  Transportation Needs: Not on file  Physical Activity: Not on file  Stress: Not on file  Social Connections: Not on file    Tobacco Counseling Counseling given: Not Answered   Clinical Intake:                 Diabetic?no         Activities of Daily Living No flowsheet data found.  Patient Care Team: [name], MD as PCP - [facility], Marga Melnick, MD (Inactive) as Attending Physician (Gastroenterology) Florian Buff, MD as Consulting Physician (Obstetrics and Gynecology)  Indicate any recent Medical Services you may have received from other than [facility] providers in the past year (date may be approximate).     Assessment:   This is a routine wellness examination for Toni Miller.  Hearing/Vision screen No results found.  Dietary issues and exercise activities discussed:     Goals Addressed   None   Depression Screen PHQ 2/9 Scores 07/06/2021 01/04/2021 09/02/2020 09/02/2020 08/17/2020 08/17/2020 08/04/2020  PHQ - 2 Score 4 0 1 1 0 1 2  PHQ- 9 Score 12 4 1 4 2 8 5   Some encounter information is confidential and restricted. Go to Review Flowsheets activity to see all data.    Fall Risk Fall Risk  07/06/2021 02/16/2021 01/04/2021 09/02/2020 08/17/2020  Falls in the past year? 0 0 0 0 1  Number falls in past yr: 0 0 0 0 0  Injury with Fall? 0 0 0 0 0  Risk for fall due to : - - No Fall Risks No Fall Risks -  Follow up - - Falls evaluation completed Falls evaluation completed  Falls evaluation completed    FALL  RISK PREVENTION PERTAINING TO THE HOME:  Any stairs in or around the home? No  If so, are there any without handrails? No  Home free of loose throw rugs in walkways, pet beds, electrical cords, etc? Yes  Adequate lighting in your home to reduce risk of falls? Yes   ASSISTIVE DEVICES UTILIZED TO PREVENT FALLS:  Life alert? No  Use of a cane, walker or w/c? No  Grab bars in the bathroom? Yes  Shower chair or bench in shower? No  Elevated toilet seat or a handicapped toilet? No   TIMED UP AND GO:  Was the test performed? No .  Length of time to ambulate 10 feet: n/a sec.     Cognitive Function: MMSE - Mini Mental State Exam 10/08/2018 08/28/2018  Orientation to time 5 4  Orientation to Place 4 5  Registration 3 3  Attention/ Calculation 0 0  Recall 2 3  Language- name 2 objects 2 2  Language- repeat 0 1  Language- follow 3 step command 3 3  Language- read & follow direction 1 1  Write a sentence 1 0  Copy design 0 0  Total score 21 22     6CIT Screen 09/02/2020 08/30/2019 08/27/2018 08/02/2017  What Year? 0 points 0 points 0 points 0 points  What month? 0 points 0 points 0 points 0 points  What time? 0 points 0 points 0 points 0 points  Count back from 20 0 points 0 points 2 points 0 points  Months in reverse 4 points 0 points 4 points 0 points  Repeat phrase 2 points 4 points 4 points 0 points  Total Score 6 4 10  0    Immunizations Immunization History  Administered Date(s) Administered   Influenza Split 07/27/2011, 07/26/2012, 08/16/2016   Influenza Whole 01/08/2008, 08/07/2008   Influenza,inj,Quad PF,6+ Mos 08/21/2013, 08/11/2014, 07/28/2015, 07/27/2018, 08/07/2019, 08/17/2020, 07/06/2021   Moderna SARS-COV2 Booster Vaccination 02/22/2021   Moderna Sars-Covid-2 Vaccination 01/31/2020, 03/06/2020   Td 05/25/2004   Tdap 02/24/2015   Zoster Recombinat (Shingrix) 02/20/2018, 07/19/2021    TDAP status: Up to date  Flu  Vaccine status: Up to date  Pneumococcal vaccine status: Due, Education has been provided regarding the importance of this vaccine. Advised may receive this vaccine at local pharmacy or Health Dept. Aware to provide a copy of the vaccination record if obtained from local pharmacy or Health Dept. Verbalized acceptance and understanding.  Covid-19 vaccine status: Completed vaccines  Qualifies for Shingles Vaccine? Yes   Zostavax completed Yes   Shingrix Completed?: Yes  Screening Tests Health Maintenance  Topic Date Due   Pneumococcal Vaccine 61-16 Years old (1 - PCV) Never done   COVID-19 Vaccine (3 - Booster for Moderna series) 04/19/2021   PAP SMEAR-Modifier  08/06/2022   MAMMOGRAM  07/27/2023   TETANUS/TDAP  02/23/2025   COLONOSCOPY (Pts 45-4yrs Insurance coverage will need to be confirmed)  04/13/2031   INFLUENZA VACCINE  Completed   Hepatitis C Screening  Completed   HIV Screening  Completed   Zoster Vaccines- Shingrix  Completed   HPV VACCINES  Aged Out    Health Maintenance  Health Maintenance Due  Topic Date Due   Pneumococcal Vaccine 56-60 Years old (1 - PCV) Never done   COVID-19 Vaccine (3 - Booster for Moderna series) 04/19/2021    Colorectal cancer screening: Type of screening: Colonoscopy. Completed 04/12/2021. Repeat every 10 years  Mammogram status: Completed 07/26/2021. Repeat every year  Bone Density status: Ordered  . Pt  provided with contact info and advised to call to schedule appt.  Lung Cancer Screening: (Low Dose CT Chest recommended if Age 21-80 years, 30 pack-year currently smoking OR have quit w/in 15years.) does not qualify.   Lung Cancer Screening Referral: n/a  Additional Screening:  Hepatitis C Screening: does qualify; Completed 10/08/2015  Vision Screening: Recommended annual ophthalmology exams for early detection of glaucoma and other disorders of the eye. Is the patient up to date with their annual eye exam?  Yes  Who is the  provider or what is the name of the office in which the patient attends annual eye exams? My eye doctor Linna Hoff If pt is not established with a provider, would they like to be referred to a provider to establish care? No .   Dental Screening: Recommended annual dental exams for proper oral hygiene  Community Resource Referral / Chronic Care Management: CRR required this visit?  No   CCM required this visit?  No      Plan:     I have personally reviewed and noted the following in the patient's chart:   Medical and social history Use of alcohol, tobacco or illicit drugs  Current medications and supplements including opioid prescriptions.  Functional ability and status Nutritional status Physical activity Advanced directives List of other physicians Hospitalizations, surgeries, and ER visits in previous 12 months Vitals Screenings to include cognitive, depression, and falls Referrals and appointments  In addition, I have reviewed and discussed with patient certain preventive protocols, quality metrics, and best practice recommendations. A written personalized care plan for preventive services as well as general preventive health recommendations were provided to patient.     Quentin Angst, [place]   09/07/2021   Nurse Notes: This is a tele health visit with the patient at home. The provider was in the office and was Rutwik Patel,MD.

## 2021-09-07 NOTE — Progress Notes (Signed)
   Toni Miller     MRN: [id number]      DOB: 06/27/1961   HPI Toni Miller is here for follow up and re-evaluation of chronic medical conditions, medication management and review of any available recent lab and radiology data.  Preventive health is updated, specifically  Cancer screening and Immunization.   Questions or concerns regarding consultations or procedures which the PT has had in the interim are  addressed. The PT denies any adverse reactions to current medications since the last visit.  Increased hip and knee pain over the past week, wants injections for this, no inciting trauma  ROS Denies recent fever or chills. Denies sinus pressure, nasal congestion, ear pain or sore throat. Denies chest congestion, productive cough or wheezing. Denies chest pains, palpitations and leg swelling Denies abdominal pain, nausea, vomiting,diarrhea or constipation.   Denies dysuria, frequency, hesitancy or incontinence.  Denies headaches, seizures, numbness, or tingling. Denies depression, anxiety or insomnia. Denies skin break down or rash.   PE  BP 103/72 (BP Location: Left Arm, Patient Position: Sitting, Cuff Size: Normal)   Pulse 95   Temp 99.3 F (37.4 C) (Oral)   Ht 5\' 1"  (1.549 m)   Wt 126 lb (57.2 kg)   SpO2 95%   BMI 23.81 kg/m   Patient alert and oriented and in no cardiopulmonary distress.  HEENT: No facial asymmetry, EOMI,     Neck supple .  Chest: Clear to auscultation bilaterally.  CVS: S1, S2 no murmurs, no S3.Regular rate.  ABD: Soft non tender.   Ext: No edema  MS: Adequate ROM spine, shoulders,  decreased in left hip  and  decreased in right knee.  Skin: Intact, no ulcerations or rash noted.  Psych: Good eye contact, normal affect. Memory intact not anxious or depressed appearing.  CNS: CN 2-12 intact, power,  normal throughout.no focal deficits noted.   Assessment & Plan  Left hip pain Uncontrolled.Toradol and depo medrol administered IM in the  office , to be followed by a short course of oral prednisone    Hyperlipidemia with target LDL less than 100 Hyperlipidemia:Low fat diet discussed and encouraged.   Lipid Panel  Lab Results  Component Value Date   CHOL 161 07/07/2021   HDL 64 07/07/2021   LDLCALC 81 07/07/2021   TRIG 85 07/07/2021   CHOLHDL 2.5 07/07/2021   Controlled, no change in medication     Hypertension Controlled, no change in medication DASH diet and commitment to daily physical activity for a minimum of 30 minutes discussed and encouraged, as a part of hypertension management. The importance of attaining a healthy weight is also discussed.  BP/Weight 09/07/2021 07/06/2021 06/03/2021 04/12/2021 03/30/2021 03/07/2021 01/28/5572  Systolic BP 220 254 270 92 [phone]  Diastolic BP 72 85 84 62 96 71 101  Wt. (Lbs) 126 125.04 126.6 129 129 - 129.13  BMI 23.81 23.63 23.92 24.37 24.37 - 24.4  Some encounter information is confidential and restricted. Go to Review Flowsheets activity to see all data.       Right knee pain Increased and uncontrolled pain x 1 week, no inciting trauma, Toradol, depomedrol and short course of prednisone  Osteopenia Updated dexa needed  Depression, major, recurrent, in partial remission (HCC) Controlled, no change in medication

## 2021-09-07 NOTE — Patient Instructions (Addendum)
F/u in 5 months, call if you need me sooner  Toradol 60 m iM and depo medrol 80 mg IM in the office followed by  5 day course of prednisone for left hip and rigth knee pain  Right knee X ray in 02/2021 shows mild arthritis, no need for repeat imaging  Please get your covid vaccine, past due  Fasting lipid, cmp and EGFr 5 days before follow up  Bone density to be scheduled at checkout  Thanks for choosing [facility], we consider it a privelige to serve you.

## 2021-09-07 NOTE — Assessment & Plan Note (Signed)
Uncontrolled.Toradol and depo medrol administered IM in the office , to be followed by a short course of oral prednisone   

## 2021-09-07 NOTE — Patient Instructions (Signed)
Toni Miller , Thank you for taking time to come for your Medicare Wellness Visit. I appreciate your ongoing commitment to your health goals. Please review the following plan we discussed and let me know if I can assist you in the future.   Screening recommendations/referrals: Colonoscopy: not due  Mammogram: not due  Bone Density: due now         Recommended yearly ophthalmology/optometry visit for glaucoma screening and checkup Recommended yearly dental visit for hygiene and checkup  Vaccinations: Influenza vaccine: not due  Pneumococcal vaccine: due now Tdap vaccine: not due Shingles vaccine: not due     Advanced directives: declined   Conditions/risks identified: Hypertension   Next appointment: 1 year   Preventive Care 40-64 Years, Female Preventive care refers to lifestyle choices and visits with your health care provider that can promote health and wellness. What does preventive care include? A yearly physical exam. This is also called an annual well check. Dental exams once or twice a year. Routine eye exams. Ask your health care provider how often you should have your eyes checked. Personal lifestyle choices, including: Daily care of your teeth and gums. Regular physical activity. Eating a healthy diet. Avoiding tobacco and drug use. Limiting alcohol use. Practicing safe sex. Taking low-dose aspirin daily starting at age 45. Taking vitamin and mineral supplements as recommended by your health care provider. What happens during an annual well check? The services and screenings done by your health care provider during your annual well check will depend on your age, overall health, lifestyle risk factors, and family history of disease. Counseling  Your health care provider may ask you questions about your: Alcohol use. Tobacco use. Drug use. Emotional well-being. Home and relationship well-being. Sexual activity. Eating habits. Work and work Statistician. Method of  birth control. Menstrual cycle. Pregnancy history. Screening  You may have the following tests or measurements: Height, weight, and BMI. Blood pressure. Lipid and cholesterol levels. These may be checked every 5 years, or more frequently if you are over 65 years old. Skin check. Lung cancer screening. You may have this screening every year starting at age 22 if you have a 30-pack-year history of smoking and currently smoke or have quit within the past 15 years. Fecal occult blood test (FOBT) of the stool. You may have this test every year starting at age 38. Flexible sigmoidoscopy or colonoscopy. You may have a sigmoidoscopy every 5 years or a colonoscopy every 10 years starting at age 79. Hepatitis C blood test. Hepatitis B blood test. Sexually transmitted disease (STD) testing. Diabetes screening. This is done by checking your blood sugar (glucose) after you have not eaten for a while (fasting). You may have this done every 1-3 years. Mammogram. This may be done every 1-2 years. Talk to your health care provider about when you should start having regular mammograms. This may depend on whether you have a family history of breast cancer. BRCA-related cancer screening. This may be done if you have a family history of breast, ovarian, tubal, or peritoneal cancers. Pelvic exam and Pap test. This may be done every 3 years starting at age 37. Starting at age 29, this may be done every 5 years if you have a Pap test in combination with an HPV test. Bone density scan. This is done to screen for osteoporosis. You may have this scan if you are at high risk for osteoporosis. Discuss your test results, treatment options, and if necessary, the need for more tests with  your health care provider. Vaccines  Your health care provider may recommend certain vaccines, such as: Influenza vaccine. This is recommended every year. Tetanus, diphtheria, and acellular pertussis (Tdap, Td) vaccine. You may need a Td  booster every 10 years. Zoster vaccine. You may need this after age 70. Pneumococcal 13-valent conjugate (PCV13) vaccine. You may need this if you have certain conditions and were not previously vaccinated. Pneumococcal polysaccharide (PPSV23) vaccine. You may need one or two doses if you smoke cigarettes or if you have certain conditions. Talk to your health care provider about which screenings and vaccines you need and how often you need them. This information is not intended to replace advice given to you by your health care provider. Make sure you discuss any questions you have with your health care provider. Document Released: 11/20/2015 Document Revised: 07/13/2016 Document Reviewed: 08/25/2015 Elsevier Interactive Patient Education  2017 [place] Prevention in the Home Falls can cause injuries. They can happen to people of all ages. There are many things you can do to make your home safe and to help prevent falls. What can I do on the outside of my home? Regularly fix the edges of walkways and driveways and fix any cracks. Remove anything that might make you trip as you walk through a door, such as a raised step or threshold. Trim any bushes or trees on the path to your home. Use bright outdoor lighting. Clear any walking paths of anything that might make someone trip, such as rocks or tools. Regularly check to see if handrails are loose or broken. Make sure that both sides of any steps have handrails. Any raised decks and porches should have guardrails on the edges. Have any leaves, snow, or ice cleared regularly. Use sand or salt on walking paths during winter. Clean up any spills in your garage right away. This includes oil or grease spills. What can I do in the bathroom? Use night lights. Install grab bars by the toilet and in the tub and shower. Do not use towel bars as grab bars. Use non-skid mats or decals in the tub or shower. If you need to sit down in the  shower, use a plastic, non-slip stool. Keep the floor dry. Clean up any water that spills on the floor as soon as it happens. Remove soap buildup in the tub or shower regularly. Attach bath mats securely with double-sided non-slip rug tape. Do not have throw rugs and other things on the floor that can make you trip. What can I do in the bedroom? Use night lights. Make sure that you have a light by your bed that is easy to reach. Do not use any sheets or blankets that are too big for your bed. They should not hang down onto the floor. Have a firm chair that has side arms. You can use this for support while you get dressed. Do not have throw rugs and other things on the floor that can make you trip. What can I do in the kitchen? Clean up any spills right away. Avoid walking on wet floors. Keep items that you use a lot in easy-to-reach places. If you need to reach something above you, use a strong step stool that has a grab bar. Keep electrical cords out of the way. Do not use floor polish or wax that makes floors slippery. If you must use wax, use non-skid floor wax. Do not have throw rugs and other things on the floor  that can make you trip. What can I do with my stairs? Do not leave any items on the stairs. Make sure that there are handrails on both sides of the stairs and use them. Fix handrails that are broken or loose. Make sure that handrails are as long as the stairways. Check any carpeting to make sure that it is firmly attached to the stairs. Fix any carpet that is loose or worn. Avoid having throw rugs at the top or bottom of the stairs. If you do have throw rugs, attach them to the floor with carpet tape. Make sure that you have a light switch at the top of the stairs and the bottom of the stairs. If you do not have them, ask someone to add them for you. What else can I do to help prevent falls? Wear shoes that: Do not have high heels. Have rubber bottoms. Are comfortable and  fit you well. Are closed at the toe. Do not wear sandals. If you use a stepladder: Make sure that it is fully opened. Do not climb a closed stepladder. Make sure that both sides of the stepladder are locked into place. Ask someone to hold it for you, if possible. Clearly mark and make sure that you can see: Any grab bars or handrails. First and last steps. Where the edge of each step is. Use tools that help you move around (mobility aids) if they are needed. These include: Canes. Walkers. Scooters. Crutches. Turn on the lights when you go into a dark area. Replace any light bulbs as soon as they burn out. Set up your furniture so you have a clear path. Avoid moving your furniture around. If any of your floors are uneven, fix them. If there are any pets around you, be aware of where they are. Review your medicines with your doctor. Some medicines can make you feel dizzy. This can increase your chance of falling. Ask your doctor what other things that you can do to help prevent falls. This information is not intended to replace advice given to you by your health care provider. Make sure you discuss any questions you have with your health care provider. Document Released: 08/20/2009 Document Revised: 03/31/2016 Document Reviewed: 11/28/2014 Elsevier Interactive Patient Education  2017 [employer].

## 2021-09-08 ENCOUNTER — Encounter: Payer: Self-pay | Admitting: Family Medicine

## 2021-09-08 NOTE — Assessment & Plan Note (Signed)
Hyperlipidemia:Low fat diet discussed and encouraged.   Lipid Panel  Lab Results  Component Value Date   CHOL 161 07/07/2021   HDL 64 07/07/2021   LDLCALC 81 07/07/2021   TRIG 85 07/07/2021   CHOLHDL 2.5 07/07/2021   Controlled, no change in medication

## 2021-09-08 NOTE — Assessment & Plan Note (Signed)
Controlled, no change in medication DASH diet and commitment to daily physical activity for a minimum of 30 minutes discussed and encouraged, as a part of hypertension management. The importance of attaining a healthy weight is also discussed.  BP/Weight 09/07/2021 07/06/2021 06/03/2021 04/12/2021 03/30/2021 03/07/2021 7/46/0029  Systolic BP 847 308 569 92 437 005 259  Diastolic BP 72 85 84 62 96 71 101  Wt. (Lbs) 126 125.04 126.6 129 129 - 129.13  BMI 23.81 23.63 23.92 24.37 24.37 - 24.4  Some encounter information is confidential and restricted. Go to Review Flowsheets activity to see all data.

## 2021-09-08 NOTE — Assessment & Plan Note (Signed)
Controlled, no change in medication  

## 2021-09-08 NOTE — Assessment & Plan Note (Signed)
Updated dexa needed

## 2021-09-08 NOTE — Assessment & Plan Note (Signed)
Increased and uncontrolled pain x 1 week, no inciting trauma, Toradol, depomedrol and short course of prednisone

## 2021-09-09 ENCOUNTER — Other Ambulatory Visit: Payer: Self-pay | Admitting: Family Medicine

## 2021-09-14 ENCOUNTER — Other Ambulatory Visit: Payer: Self-pay

## 2021-09-14 ENCOUNTER — Ambulatory Visit (HOSPITAL_COMMUNITY)
Admission: RE | Admit: 2021-09-14 | Discharge: 2021-09-14 | Disposition: A | Discharge status: Home / Self Care | Payer: Medicare Other | Source: Ambulatory Visit | Attending: Family Medicine | Admitting: Family Medicine

## 2021-09-14 DIAGNOSIS — M8589 Other specified disorders of bone density and structure, multiple sites: Secondary | ICD-10-CM | POA: Diagnosis not present

## 2021-09-14 DIAGNOSIS — Z780 Asymptomatic menopausal state: Secondary | ICD-10-CM | POA: Diagnosis not present

## 2021-09-14 DIAGNOSIS — Z13820 Encounter for screening for osteoporosis: Secondary | ICD-10-CM | POA: Diagnosis not present

## 2021-09-14 DIAGNOSIS — M8580 Other specified disorders of bone density and structure, unspecified site: Secondary | ICD-10-CM

## 2021-10-02 ENCOUNTER — Other Ambulatory Visit: Payer: Self-pay | Admitting: Family Medicine

## 2021-10-02 DIAGNOSIS — G8929 Other chronic pain: Secondary | ICD-10-CM

## 2021-10-02 DIAGNOSIS — M5442 Lumbago with sciatica, left side: Secondary | ICD-10-CM

## 2021-10-05 ENCOUNTER — Other Ambulatory Visit: Payer: Self-pay | Admitting: Family Medicine

## 2021-10-11 ENCOUNTER — Other Ambulatory Visit: Payer: Self-pay

## 2021-10-16 MED ORDER — ESOMEPRAZOLE MAGNESIUM 40 MG PO CPDR: DELAYED_RELEASE_CAPSULE | ORAL | 5 refills | Status: DC

## 2021-10-26 ENCOUNTER — Other Ambulatory Visit: Payer: Self-pay | Admitting: Family Medicine

## 2021-11-29 ENCOUNTER — Other Ambulatory Visit: Payer: Self-pay | Admitting: Family Medicine
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
## 2021-12-01 ENCOUNTER — Telehealth: Payer: Self-pay | Admitting: Psychiatry

## 2021-12-01 ENCOUNTER — Telehealth: Payer: Medicare Other | Admitting: Psychiatry

## 2021-12-01 ENCOUNTER — Other Ambulatory Visit: Payer: Self-pay

## 2021-12-01 NOTE — Telephone Encounter (Signed)
Sent link for video visit through Epic. Patient did not sign in. Called the patient for appointment scheduled today. The patient did not answer the phone. Left voice message to contact the office ([phone]).   ?

## 2021-12-10 ENCOUNTER — Encounter: Payer: Self-pay | Admitting: Nurse Practitioner

## 2021-12-10 ENCOUNTER — Ambulatory Visit (INDEPENDENT_AMBULATORY_CARE_PROVIDER_SITE_OTHER): Payer: 59 | Admitting: Nurse Practitioner

## 2021-12-10 ENCOUNTER — Other Ambulatory Visit: Payer: Self-pay

## 2021-12-10 DIAGNOSIS — M25552 Pain in left hip: Secondary | ICD-10-CM | POA: Diagnosis not present

## 2021-12-10 DIAGNOSIS — G8929 Other chronic pain: Secondary | ICD-10-CM

## 2021-12-10 DIAGNOSIS — M5442 Lumbago with sciatica, left side: Secondary | ICD-10-CM | POA: Diagnosis not present

## 2021-12-10 MED ORDER — CYCLOBENZAPRINE HCL 5 MG PO TABS: ORAL_TABLET | ORAL | 0 refills | Status: DC

## 2021-12-10 MED ORDER — IBUPROFEN 600 MG PO TABS: 600.0000 mg | ORAL_TABLET | Freq: Two times a day (BID) | ORAL | 0 refills | Status: DC | PRN

## 2021-12-10 MED ORDER — KETOROLAC TROMETHAMINE 60 MG/2ML IM SOLN
60.0000 mg | Freq: Once | INTRAMUSCULAR | Status: AC
  Administered 2021-12-10: 60 mg via INTRAMUSCULAR

## 2021-12-10 NOTE — Patient Instructions (Addendum)
Take flexeril 5 mg daily as needed for your hip pain Take ibuprofen 600 mg two times daily as needed for your hip pain. Do not take this medication with naproxen.  Please get Toradol injection 60mg  in the office today  It is important that you exercise regularly at least 30 minutes 5 times a week.  Think about what you will eat, plan ahead. Choose " clean, green, fresh or frozen" over canned, processed or packaged foods which are more sugary, salty and fatty. 70 to 75% of food eaten should be vegetables and fruit. Three meals at set times with snacks allowed between meals, but they must be fruit or vegetables. Aim to eat over a 12 hour period , example 7 am to 7 pm, and STOP after  your last meal of the day. Drink water,generally about 64 ounces per day, no other drink is as healthy. Fruit juice is best enjoyed in a healthy way, by EATING the fruit.  Thanks for choosing [facility], we consider it a privelige to serve you. Do not take this medication with napr

## 2021-12-10 NOTE — Assessment & Plan Note (Signed)
Toradol 60mg  injection given today Take flexeril 5mg  BID PRN TAKE IBUPROFEN 600mg  BID PRN.

## 2021-12-10 NOTE — Progress Notes (Signed)
° °  Toni Miller     MRN: [id number]      DOB: [birth year]   HPI Ms. Hulsebus is here for complaints of left side pain going to the middle of her lower back since 12/07/21. Pain started in the left hip and moved to her lower back, has aching pain 7/10. She had the pain years ago and came back few days ago. She was trying to hill when the pain started. She took ibuprofen and, ice, heat pad, none worked.  pain denies, trauma,  numbness and tingling.    ROS Denies recent fever or chills. Denies sinus pressure, nasal congestion, ear pain or sore throat. Denies chest congestion, productive cough or wheezing. Denies chest pains, palpitations and leg swelling Denies abdominal pain, nausea, vomiting,diarrhea or constipation.   Denies dysuria, frequency, hesitancy or incontinence. Has left hip and low back  joint pain, swelling and limitation in mobility. Denies headaches, seizures, numbness, or tingling. Denies depression, anxiety or insomnia.    PE  BP 134/87 (BP Location: Left Arm, Patient Position: Sitting, Cuff Size: Normal)    Pulse 75    Ht 5\' 1"  (1.549 m)    Wt 129 lb 0.6 oz (58.5 kg)    SpO2 100%    BMI 24.38 kg/m   Patient alert and oriented and in no cardiopulmonary distress.  Chest: Clear to auscultation bilaterally.  CVS: S1, S2 no murmurs, no S3.Regular rate.  ABD: Soft non tender.   Ext: No edema  MS: Adequate ROM shoulders, and knees.limited ROM of spine due to pain, tenderness on palpation of right hip and right low back   Psych: Good eye contact, normal affect. Memory intact not anxious or depressed appearing.  CNS: CN 2-12 intact, power,  normal throughout.no focal deficits noted.   Assessment & Plan

## 2021-12-10 NOTE — Assessment & Plan Note (Signed)
RX flexeril 5mg  BID PRN Ibuprofen 600mg  BID PRN Toradol injection given today.

## 2021-12-17 ENCOUNTER — Other Ambulatory Visit: Payer: Self-pay | Admitting: Psychiatry

## 2021-12-17 ENCOUNTER — Other Ambulatory Visit: Payer: Self-pay | Admitting: Family Medicine

## 2021-12-19 NOTE — Telephone Encounter (Signed)
Ordered refill of venlafaxine per request. Please contact the patient to make follow up appointment. I will not be able to prescribe any more refills without evaluation.

## 2022-01-03 ENCOUNTER — Other Ambulatory Visit: Payer: Self-pay | Admitting: Family Medicine

## 2022-01-17 NOTE — Progress Notes (Unsigned)
symptomatic hemorrhoids. Prior banding by Dr. Oneida Alar in the remote past. Now with recurrence. Recent colonoscopy on file with internal hemorrhoids. She has completed left lateral banding.

## 2022-01-18 ENCOUNTER — Ambulatory Visit (INDEPENDENT_AMBULATORY_CARE_PROVIDER_SITE_OTHER): Payer: 59 | Admitting: Gastroenterology

## 2022-01-18 ENCOUNTER — Other Ambulatory Visit: Payer: Self-pay

## 2022-01-18 VITALS — BP 118/72 | HR 92 | Temp 97.0°F | Ht 60.0 in | Wt 127.6 lb

## 2022-01-18 DIAGNOSIS — K642 Third degree hemorrhoids: Secondary | ICD-10-CM | POA: Diagnosis not present

## 2022-01-18 NOTE — Patient Instructions (Signed)
I will see you several weeks! ? ?Please continue to avoid straining. ? ?You should limit your toilet time to 2-3 minutes at the most.  ? ?Continue to avoid constipation. ? ?Please call me with any concerns or issues! ? ?I will see you in follow-up for additional banding in several weeks. ? ? ? ?I enjoyed seeing you again today! As you know, I value our relationship and want to provide genuine, compassionate, and quality care. I welcome your feedback. If you receive a survey regarding your visit,  I greatly appreciate you taking time to fill this out. See you next time! ? ?Annitta Needs, PhD, ANP-BC ?[facility]  ? ? ? ? ? ?

## 2022-01-25 ENCOUNTER — Other Ambulatory Visit (HOSPITAL_COMMUNITY): Payer: Self-pay | Admitting: Family Medicine

## 2022-01-25 ENCOUNTER — Encounter: Payer: Self-pay | Admitting: Family Medicine

## 2022-01-25 ENCOUNTER — Ambulatory Visit (INDEPENDENT_AMBULATORY_CARE_PROVIDER_SITE_OTHER): Payer: 59 | Admitting: Family Medicine

## 2022-01-25 ENCOUNTER — Other Ambulatory Visit: Payer: Self-pay

## 2022-01-25 VITALS — BP 111/75 | HR 80 | Ht 61.0 in | Wt 128.0 lb

## 2022-01-25 DIAGNOSIS — K5909 Other constipation: Secondary | ICD-10-CM

## 2022-01-25 DIAGNOSIS — I10 Essential (primary) hypertension: Secondary | ICD-10-CM | POA: Diagnosis not present

## 2022-01-25 DIAGNOSIS — Z1231 Encounter for screening mammogram for malignant neoplasm of breast: Secondary | ICD-10-CM

## 2022-01-25 DIAGNOSIS — F3341 Major depressive disorder, recurrent, in partial remission: Secondary | ICD-10-CM

## 2022-01-25 DIAGNOSIS — E785 Hyperlipidemia, unspecified: Secondary | ICD-10-CM

## 2022-01-25 DIAGNOSIS — J302 Other seasonal allergic rhinitis: Secondary | ICD-10-CM

## 2022-01-25 NOTE — Patient Instructions (Addendum)
Annual exam with pap early September, call if you need me sooner ? ?Please get fasting labs already ordered either tomorrow or next Friday ? ?Please schedule September mammogram at checkout [facility]) ? ?No medication changes ? ?It is important that you exercise regularly at least 30 minutes 5 times a week. If you develop chest pain, have severe difficulty breathing, or feel very tired, stop exercising immediately and seek medical attention  ?Think about what you will eat, plan ahead. ?Choose " clean, green, fresh or frozen" over canned, processed or packaged foods which are more sugary, salty and fatty. ?70 to 75% of food eaten should be vegetables and fruit. ?Three meals at set times with snacks allowed between meals, but they must be fruit or vegetables. ?Aim to eat over a 12 hour period , example 7 am to 7 pm, and STOP after  your last meal of the day. ?Drink water,generally about 64 ounces per day, no other drink is as healthy. Fruit juice is best enjoyed in a healthy way, by EATING the fruit. ? ?

## 2022-01-27 LAB — CMP14+EGFR
ALT: 18 IU/L (ref 0–32)
AST: 22 IU/L (ref 0–40)
Albumin/Globulin Ratio: 1.6 (ref 1.2–2.2)
Albumin: 4.3 g/dL (ref 3.8–4.9)
Alkaline Phosphatase: 72 IU/L (ref 44–121)
BUN/Creatinine Ratio: 18 (ref 9–23)
BUN: 15 mg/dL (ref 6–24)
Bilirubin Total: 0.2 mg/dL (ref 0.0–1.2)
CO2: 28 mmol/L (ref 20–29)
Calcium: 9.8 mg/dL (ref 8.7–10.2)
Chloride: 100 mmol/L (ref 96–106)
Creatinine, Ser: 0.85 mg/dL (ref 0.57–1.00)
Globulin, Total: 2.7 g/dL (ref 1.5–4.5)
Glucose: 89 mg/dL (ref 70–99)
Potassium: 4.3 mmol/L (ref 3.5–5.2)
Sodium: 142 mmol/L (ref 134–144)
Total Protein: 7 g/dL (ref 6.0–8.5)
eGFR: 79 mL/min/{1.73_m2} (ref >59)

## 2022-01-27 LAB — LIPID PANEL
Chol/HDL Ratio: 2.3 ratio (ref 0.0–4.4)
Cholesterol, Total: 156 mg/dL (ref 100–199)
HDL: 67 mg/dL (ref >39)
LDL Chol Calc (NIH): 72 mg/dL (ref 0–99)
Triglycerides: 95 mg/dL (ref 0–149)
VLDL Cholesterol Cal: 17 mg/dL (ref 5–40)

## 2022-01-28 NOTE — Telephone Encounter (Signed)
letter was also mailed out asking patient to call office to set up an appt.  ?

## 2022-01-28 NOTE — Telephone Encounter (Signed)
left message to call office back to set up an appt.  ?

## 2022-01-30 ENCOUNTER — Encounter: Payer: Self-pay | Admitting: Family Medicine

## 2022-01-30 NOTE — Assessment & Plan Note (Signed)
Increased symptoms with season change, continue meds ?

## 2022-01-30 NOTE — Assessment & Plan Note (Signed)
Controlled, no change in medication  

## 2022-01-30 NOTE — Progress Notes (Signed)
? ?  Toni Miller     MRN: [id number]      DOB: [birth year] ? ? ?HPI ?Toni Miller is here for follow up and re-evaluation of chronic medical conditions, medication management and review of any available recent lab and radiology data.  ?Preventive health is updated, specifically  Cancer screening and Immunization.   ?Questions or concerns regarding consultations or procedures which the PT has had in the interim are  addressed. ?The PT c/o dry mouth thinks it may be the depression med , however I review with her that she is on multiple meds for allergies which cause dry mouth ?C/o sinus pressure and mild headache ? ?ROS ?Denies recent fever or chills. ?Denies sinus pressure, nasal congestion, ear pain or sore throat. ?Denies chest congestion, productive cough or wheezing. ?Denies chest pains, palpitations and leg swelling ?Denies abdominal pain, nausea, vomiting,diarrhea or constipation.   ?Denies dysuria, frequency, hesitancy or incontinence. ?Denies uncontrolled joint pain, swelling and limitation in mobility. ?Denies , seizures, numbness, or tingling. ?Denies uncontrolled depression, anxiety or insomnia. ?Denies skin break down or rash. ? ? ?PE ? ?BP 111/75 (BP Location: Right Arm)   Pulse 80   Ht '5\' 1"'$  (1.549 m)   Wt 128 lb (58.1 kg)   SpO2 95%   BMI 24.19 kg/m?  ? ?Patient alert and oriented and in no cardiopulmonary distress. ? ?HEENT: No facial asymmetry, EOMI,     Neck supple . ? ?Chest: Clear to auscultation bilaterally. ? ?CVS: S1, S2 no murmurs, no S3.Regular rate. ? ?ABD: Soft non tender.  ? ?Ext: No edema ? ?MS: Adequate ROM spine, shoulders, hips and knees. ? ?Skin: Intact, no ulcerations or rash noted. ? ?Psych: Good eye contact, normal affect. Memory intact not anxious or depressed appearing. ? ?CNS: CN 2-12 intact, power,  normal throughout.no focal deficits noted. ? ? ?Assessment & Plan ? ?Hypertension ?Controlled, no change in medication ?DASH diet and commitment to daily physical activity for a  minimum of 30 minutes discussed and encouraged, as a part of hypertension management. ?The importance of attaining a healthy weight is also discussed. ? ? ?  01/25/2022  ?  1:00 PM 01/18/2022  ? 11:00 AM 12/10/2021  ?  1:26 PM 09/07/2021  ? 12:58 PM 07/06/2021  ?  1:47 PM 06/03/2021  ? 10:39 AM 04/12/2021  ? 11:17 AM  ?BP/Weight  ?Systolic BP 626 948 546 270 133 122 92  ?Diastolic BP 75 72 87 72 85 84 62  ?Wt. (Lbs) 128 127.6 129.04 126 125.04 126.6   ?BMI 24.19 kg/m2 24.92 kg/m2 24.38 kg/m2 23.81 kg/m2 23.63 kg/m2 23.92 kg/m2   ? ? ? ? ? ?Hyperlipidemia with target LDL less than 100 ?Hyperlipidemia:Low fat diet discussed and encouraged. ? ? ?Lipid Panel  ?Lab Results  ?Component Value Date  ? CHOL 156 01/26/2022  ? HDL 67 01/26/2022  ? LDLCALC 72 01/26/2022  ? TRIG 95 01/26/2022  ? CHOLHDL 2.3 01/26/2022  ? ? ? ?Controlled, no change in medication ? ? ?Depression, major, recurrent, in partial remission ([place]) ?Controlled, no change in medication ? ? ?Chronic constipation ?Controlled, no change in medication ? ? ?Allergic rhinitis ?Increased symptoms with season change, continue meds ? ?

## 2022-01-30 NOTE — Assessment & Plan Note (Signed)
Hyperlipidemia:Low fat diet discussed and encouraged. ? ? ?Lipid Panel  ?Lab Results  ?Component Value Date  ? CHOL 156 01/26/2022  ? HDL 67 01/26/2022  ? LDLCALC 72 01/26/2022  ? TRIG 95 01/26/2022  ? CHOLHDL 2.3 01/26/2022  ? ? ? ?Controlled, no change in medication ? ?

## 2022-01-30 NOTE — Assessment & Plan Note (Signed)
Controlled, no change in medication ?DASH diet and commitment to daily physical activity for a minimum of 30 minutes discussed and encouraged, as a part of hypertension management. ?The importance of attaining a healthy weight is also discussed. ? ? ?  01/25/2022  ?  1:00 PM 01/18/2022  ? 11:00 AM 12/10/2021  ?  1:26 PM 09/07/2021  ? 12:58 PM 07/06/2021  ?  1:47 PM 06/03/2021  ? 10:39 AM 04/12/2021  ? 11:17 AM  ?BP/Weight  ?Systolic BP 982 641 583 094 133 122 92  ?Diastolic BP 75 72 87 72 85 84 62  ?Wt. (Lbs) 128 127.6 129.04 126 125.04 126.6   ?BMI 24.19 kg/m2 24.92 kg/m2 24.38 kg/m2 23.81 kg/m2 23.63 kg/m2 23.92 kg/m2   ? ? ? ? ?

## 2022-02-01 ENCOUNTER — Telehealth: Payer: Self-pay | Admitting: Psychiatry

## 2022-02-01 NOTE — Telephone Encounter (Signed)
Lvm to sch appt °

## 2022-02-04 ENCOUNTER — Encounter: Payer: Self-pay | Admitting: Psychiatry

## 2022-02-04 ENCOUNTER — Telehealth (INDEPENDENT_AMBULATORY_CARE_PROVIDER_SITE_OTHER): Payer: 59 | Admitting: Psychiatry

## 2022-02-04 DIAGNOSIS — F3341 Major depressive disorder, recurrent, in partial remission: Secondary | ICD-10-CM | POA: Diagnosis not present

## 2022-02-04 MED ORDER — VENLAFAXINE HCL ER 37.5 MG PO CP24: 37.5000 mg | ORAL_CAPSULE | Freq: Every day | ORAL | 0 refills | Status: DC

## 2022-02-04 NOTE — Patient Instructions (Signed)
1. Continue venlafaxine 37.5 mg daily  ?2. Next appointment: 6/29 at 8:40  ?
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
## 2022-02-06 ENCOUNTER — Ambulatory Visit
Admission: EM | Admit: 2022-02-06 | Discharge: 2022-02-06 | Disposition: A | Discharge status: Home / Self Care | Payer: 59 | Attending: Urgent Care | Admitting: Urgent Care

## 2022-02-06 DIAGNOSIS — M5489 Other dorsalgia: Secondary | ICD-10-CM

## 2022-02-06 DIAGNOSIS — M62830 Muscle spasm of back: Secondary | ICD-10-CM | POA: Diagnosis not present

## 2022-02-06 MED ORDER — MELOXICAM 7.5 MG PO TABS: 7.5000 mg | ORAL_TABLET | Freq: Every day | ORAL | 0 refills | Status: DC

## 2022-02-06 MED ORDER — TIZANIDINE HCL 4 MG PO TABS: 4.0000 mg | ORAL_TABLET | Freq: Every day | ORAL | 0 refills | Status: DC

## 2022-02-06 NOTE — ED Provider Notes (Signed)
?[place] ? ? ?MRN: [id number] DOB: 03/08/1961 ? ?Subjective:  ? ?Toni Miller is a 61 y.o. female presenting for 3-week history of persistent intermittent left low back pain that radiates over the flank side.  Has a history of low back pain with sciatica to the left side.  Last imaging done of the lumbar region was 05/16/2019 and was negative.  This particular episode started after she was lifting all of her springtime close out of her closet and trying to set it up for herself.  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  Denies any particular fall, trauma, radicular symptoms, numbness or tingling, weakness, painful urination, hematuria, history of kidney stones. ? ?No current facility-administered medications for this encounter. ? ?Current Outpatient Medications:  ?  calcium-vitamin D (OSCAL-500) 500-400 MG-UNIT tablet, Take 1 tablet by mouth 2 (two) times daily., Disp: 60 tablet, Rfl: 5 ?  cetirizine (ZYRTEC ALLERGY) 10 MG tablet, Take 1 tablet (10 mg total) by mouth daily., Disp: 30 tablet, Rfl: 0 ?  cholecalciferol (VITAMIN D3) 25 MCG (1000 UT) tablet, Take 1,000 Units by mouth daily., Disp: , Rfl:  ?  clotrimazole-betamethasone (LOTRISONE) cream, Apply 1 application topically 2 (two) times daily., Disp: 45 g, Rfl: 1 ?  conjugated estrogens (PREMARIN) vaginal cream, APPLY SPARINGLY WITH FINGERTIP THREE TIMES WEEKLY., Disp: 30 g, Rfl: 0 ?  cyclobenzaprine (FLEXERIL) 5 MG tablet, TAKE 1 TABLET BY MOUTH TWICE A DAY AS NEEDED FOR MUSCLE SPASMS., Disp: 30 tablet, Rfl: 0 ?  cycloSPORINE (RESTASIS) 0.05 % ophthalmic emulsion, Place 1 drop into both eyes 2 (two) times daily., Disp: , Rfl:  ?  esomeprazole (NEXIUM) 40 MG capsule, TAKE ONE CAPSULE BY MOUTH ONCE TO TWICE DAILY BEFORE A MEAL., Disp: 60 capsule, Rfl: 5 ?  fluticasone (FLONASE) 50 MCG/ACT nasal spray, Place 1 spray into both nostrils daily as needed for allergies or rhinitis., Disp: , Rfl:  ?   gabapentin (NEURONTIN) 300 MG capsule, TAKE 1 CAPSULE BY MOUTH TWICE DAILY., Disp: 60 capsule, Rfl: 0 ?  hydrocortisone (ANUSOL-HC) 2.5 % rectal cream, APPLY (1) APPLICATION TWICE DAILY ( FOR RECTAL ITCHING AND BURNING), Disp: 30 g, Rfl: 0 ?  hydrOXYzine (ATARAX/VISTARIL) 10 MG tablet, TAKE ONE TABLET BY MOUTH ONCE DAILY AS NEEDED FOR ITCHING., Disp: 30 tablet, Rfl: 2 ?  ibuprofen (ADVIL) 600 MG tablet, Take 1 tablet (600 mg total) by mouth 2 (two) times daily as needed., Disp: 30 tablet, Rfl: 0 ?  linaclotide (LINZESS) 145 MCG CAPS capsule, Take 145 mcg by mouth every other day., Disp: , Rfl:  ?  montelukast (SINGULAIR) 10 MG tablet, TAKE ONE TABLET BY MOUTH DAILY, Disp: 90 tablet, Rfl: 0 ?  Multiple Vitamin (MULTIVITAMIN) capsule, Take 1 capsule by mouth daily., Disp: , Rfl:  ?  NON FORMULARY, Acetaminophen/asprin/caffeine (Headache relief), Disp: , Rfl:  ?  Propylene Glycol (SYSTANE BALANCE) 0.6 % SOLN, Apply to eye., Disp: , Rfl:  ?  rosuvastatin (CRESTOR) 10 MG tablet, TAKE (1) TABLET BY MOUTH AT BEDTIME., Disp: 90 tablet, Rfl: 0 ?  TRAVATAN Z 0.004 % SOLN ophthalmic solution, Place 1 drop into both eyes at bedtime., Disp: , Rfl:  ?  triamterene-hydrochlorothiazide (MAXZIDE-25) 37.5-25 MG tablet, TAKE 1 & 1/2 TABLETS DAILY FOR BLOOD PRESSURE., Disp: 135 tablet, Rfl: 0 ?  [START ON 03/20/2022] venlafaxine XR (EFFEXOR-XR) 37.5 MG 24 hr capsule, Take 1 capsule (37.5 mg total) by mouth daily., Disp: 90 capsule, Rfl: 0  ? ?Allergies  ?  Allergen Reactions  ? Acyclovir And Related Rash  ? ? ?Past Medical History:  ?Diagnosis Date  ? Anxiety   ? Arthritis   ? Cancer [facility]) 1993  ? abnormal pap treated at [facility]  ? Constipation   ? Depression   ? GERD (gastroesophageal reflux disease)   ? Glaucoma 1993  ? History of kidney stones   ? Hot flashes, menopausal 06/12/2011  ? Hyperlipidemia   ? Hypertension   ? Neck pain 7/09  ? WITH BULGING DISC-----RECIEVING EPIDURALS   ? Sinusitis   ?  ? ?Past Surgical History:  ?Procedure  Laterality Date  ? ABDOMINAL EXPLORATION SURGERY  age 24  ? bowel obstruction, [facility]  ? ABDOMINAL HYSTERECTOMY  2007  ? [facility], [facility]  ? BILATERAL EYE SURGERY    ? for glaucoma  ? BIOPSY  01/09/2019  ? Procedure: BIOPSY;  Surgeon: Danie Binder, MD;  Location: [facility] END[facility] SU[facility];  Service: Endoscopy;;  gstric ?  ? bone removed from under tongue    ? BREAST BIOPSY Left   ? benign  ? BREAST SURGERY Left 2004  ? left partial mastectomy, [facility]  ? CATARACT EXTRACTION Right 06/04/2015  ? CATARACT EXTRACTION W/PHACO  05/16/2011  ? Procedure: CATARACT EXTRACTION PHACO AND INTRAOCULAR LENS PLACEMENT (IOC);  Surgeon: Tonny Branch;  Location: [facility] ORS;  Service: Ophthalmology;  Laterality: Right;  ? COLONOSCOPY N/A 05/17/2013  ? Dr. Barnie Alderman diverticulosis was noted/small internal hemorrhoids  ? COLONOSCOPY WITH PROPOFOL N/A 04/12/2021  ? Colonoscopy June 2022: small anal sphincter s/p rectal surgery, internal hemorrhoids, one 2 mm polyp in cecum (benign). 10 year surveillance.  ? CYST REMOVED LEFT BREAST /BENIGN  2005  ? left, [facility]  ? ESOPHAGOGASTRODUODENOSCOPY N/A 01/09/2019  ? normal esophagus. Mild NSAID gastritis s/p biopsy. +H.pylori. treated with amoxicillin, biaxin, Nexium.Marland Kitchen ERADICATION DOCUMENTED  ? EYE SURGERY  2010, 1992 approx  ? bilateral  ? POLYPECTOMY  04/12/2021  ? Procedure: POLYPECTOMY;  Surgeon: Eloise Harman, DO;  Location: [facility];  Service: Endoscopy;;  ? REPAIR IMPERFORATE ANUS / ANORECTOPLASTY    ? per patient  ? TOTAL ABDOMINAL HYSTERECTOMY W/ BILATERAL SALPINGOOPHORECTOMY  2007  ? [facility], [place]  ? TUBAL LIGATION  1991  ? ? ?Family History  ?Adopted: Yes  ?Problem Relation Age of Onset  ? Alcohol abuse Mother   ? Stomach cancer Mother 34  ? Lung cancer Father   ? Glaucoma Father   ? Alcohol abuse Father   ? Seizures Father   ? Breast cancer Sister 32  ? Breast cancer Sister 48  ? Heart disease Sister   ? Heart attack Sister   ? Glaucoma Son   ? Glaucoma Maternal Grandfather   ? Cancer Maternal Grandfather   ?      poss leukemia  ? SIDS Brother   ? Hyperthyroidism Daughter   ? Seizures Son   ?     as a baby  ? Kidney disease Maternal Grandmother   ? Heart disease Maternal Grandmother   ?     pace maker  ? Leukemia Maternal Grandmother   ? Colon cancer Neg Hx   ? ? ?Social History  ? ?Tobacco Use  ? Smoking status: Former  ?  Packs/day: 3.00  ?  Years: 2.00  ?  Pack years: 6.00  ?  Types: Cigarettes  ?  Quit date: 05/12/1991  ?  Years since quitting: 30.7  ? Smokeless tobacco: Never  ?Vaping Use  ? Vaping Use: Never used  ?Substance  Use Topics  ? Alcohol use: Yes  ?  Comment: occasionally  ? Drug use: Never  ? ? ?ROS ? ? ?Objective:  ? ?Vitals: ?BP 131/81 (BP Location: Right Arm)   Pulse 81   Temp 98.8 ?F (37.1 ?C) (Oral)   Resp 18   SpO2 97%  ? ?Physical Exam ?Constitutional:   ?   General: She is not in acute distress. ?   Appearance: Normal appearance. She is well-developed. She is not ill-appearing, toxic-appearing or diaphoretic.  ?HENT:  ?   Head: Normocephalic and atraumatic.  ?   Nose: Nose normal.  ?   Mouth/Throat:  ?   Mouth: Mucous membranes are moist.  ?Eyes:  ?   General: No scleral icterus.    ?   Right eye: No discharge.     ?   Left eye: No discharge.  ?   Extraocular Movements: Extraocular movements intact.  ?Cardiovascular:  ?   Rate and Rhythm: Normal rate.  ?Pulmonary:  ?   Effort: Pulmonary effort is normal.  ?Musculoskeletal:  ?   Comments: Full range of motion throughout.  Strength 5/5 for lower extremities.  Patient ambulates without any assistance at expected pace.  No ecchymosis, swelling, lacerations or abrasions.  Patient does have paraspinal muscle tenderness along the left lower region of her back extending toward the flank side excluding the midline.  Negative straight leg raise. ?  ?Skin: ?   General: Skin is warm and dry.  ?Neurological:  ?   General: No focal deficit present.  ?   Mental Status: She is alert and oriented to person, place, and time.  ?Psychiatric:     ?   Mood and Affect:  Mood normal.     ?   Behavior: Behavior normal.  ? ? ?Assessment and Plan :  ? ?PDMP not reviewed this encounter. ? ?1. Left paraspinal back pain   ?2. Spasm of muscle of lower back   ? ?Suspect that patient in

## 2022-02-06 NOTE — ED Triage Notes (Signed)
Pt reports eft sided back pain and swelling x 3 weeks. States she had pain in the left leg. Pt was told by her PCP she had sciatic pain. Left leg pain improve after ibuprofen 600 mg and Flexeril. Back pain is wore when sitting and standing up.  ?

## 2022-02-08 ENCOUNTER — Ambulatory Visit: Payer: Medicare Other | Admitting: Family Medicine

## 2022-02-10 ENCOUNTER — Encounter: Payer: Self-pay | Admitting: Gastroenterology

## 2022-02-10 ENCOUNTER — Ambulatory Visit (INDEPENDENT_AMBULATORY_CARE_PROVIDER_SITE_OTHER): Payer: 59 | Admitting: Gastroenterology

## 2022-02-10 VITALS — BP 110/62 | HR 59 | Temp 96.9°F | Ht 61.0 in | Wt 127.2 lb

## 2022-02-10 DIAGNOSIS — K642 Third degree hemorrhoids: Secondary | ICD-10-CM | POA: Diagnosis not present

## 2022-02-10 NOTE — Patient Instructions (Signed)
I recommend starting Benefiber 2 teaspoons each day mixed in your beverage of choice. Continue Linzess as you are doing., ? ?If no improvement with constipation, please let us know! ? ?We will see you in 3 months! ? ?I enjoyed seeing you again today! As you know, I value our relationship and want to provide genuine, compassionate, and quality care. I welcome your feedback. If you receive a survey regarding your visit,  I greatly appreciate you taking time to fill this out. See you next time! ? ?Annitta Needs, PhD, ANP-BC ?[facility]  ? ?

## 2022-02-10 NOTE — Progress Notes (Signed)
? ? ?  [place] BANDING PROCEDURE NOTE ? ?Toni Miller is a 61 y.o. female presenting today for consideration of hemorrhoid banding. Last colonoscopy 2022 with internal hemorrhoids. She has had left lateral and right posterior banding thus far. Linzess 145 mcg daily not ideal.  ? ?The patient presents with symptomatic grade 3 hemorrhoids, unresponsive to maximal medical therapy, requesting rubber band ligation of her hemorrhoidal disease. All risks, benefits, and alternative forms of therapy were described and informed consent was obtained. ? ? ?The decision was made to band the right anterior internal hemorrhoid, and the [place] was used to perform band ligation without complication. Digital anorectal examination was then performed to assure proper positioning of the band, and the band was unable to be found. I then banded neutrally, and this did wonderfully and appeared to be in the right anterior position. I suspect I "overshot" the initial attempt.The patient was discharged home without pain or other issues. Dietary and behavioral recommendations were given, along with follow-up instructions. The patient will return in 3 months. I have asked her to add Benefiber daily in addition to the Linzess 145 mcg. We may need to increase this. ? ?No complications were encountered and the patient tolerated the procedure well.  ? ?Annitta Needs, PhD, ANP-BC ?[facility]  ? ?

## 2022-02-15 ENCOUNTER — Other Ambulatory Visit: Payer: Self-pay | Admitting: Family Medicine

## 2022-03-22 ENCOUNTER — Other Ambulatory Visit: Payer: Self-pay

## 2022-03-22 DIAGNOSIS — L309 Dermatitis, unspecified: Secondary | ICD-10-CM

## 2022-03-22 MED ORDER — HYDROXYZINE HCL 10 MG PO TABS: ORAL_TABLET | ORAL | 2 refills | Status: DC

## 2022-04-01 ENCOUNTER — Other Ambulatory Visit: Payer: Self-pay | Admitting: Family Medicine

## 2022-04-06 ENCOUNTER — Other Ambulatory Visit: Payer: Self-pay | Admitting: Family Medicine

## 2022-04-07 ENCOUNTER — Other Ambulatory Visit: Payer: Self-pay | Admitting: Gastroenterology

## 2022-04-27 ENCOUNTER — Ambulatory Visit (INDEPENDENT_AMBULATORY_CARE_PROVIDER_SITE_OTHER): Payer: 59 | Admitting: Family Medicine

## 2022-04-27 ENCOUNTER — Encounter: Payer: Self-pay | Admitting: Family Medicine

## 2022-04-27 VITALS — BP 151/98 | HR 86 | Ht 62.0 in | Wt 127.0 lb

## 2022-04-27 DIAGNOSIS — L03311 Cellulitis of abdominal wall: Secondary | ICD-10-CM

## 2022-04-27 DIAGNOSIS — I10 Essential (primary) hypertension: Secondary | ICD-10-CM | POA: Diagnosis not present

## 2022-04-27 DIAGNOSIS — L309 Dermatitis, unspecified: Secondary | ICD-10-CM | POA: Diagnosis not present

## 2022-04-27 DIAGNOSIS — M79651 Pain in right thigh: Secondary | ICD-10-CM | POA: Diagnosis not present

## 2022-04-27 DIAGNOSIS — L0390 Cellulitis, unspecified: Secondary | ICD-10-CM | POA: Insufficient documentation

## 2022-04-27 MED ORDER — PREDNISONE 5 MG PO TABS: 5.0000 mg | ORAL_TABLET | Freq: Two times a day (BID) | ORAL | 0 refills | Status: AC

## 2022-04-27 MED ORDER — DOXYCYCLINE HYCLATE 100 MG PO TABS: 100.0000 mg | ORAL_TABLET | Freq: Two times a day (BID) | ORAL | 0 refills | Status: DC

## 2022-04-27 NOTE — Assessment & Plan Note (Signed)
Short course of prednisone is prescribed

## 2022-04-27 NOTE — Assessment & Plan Note (Signed)
5 day course of doxycyline prescribed

## 2022-04-27 NOTE — Patient Instructions (Signed)
F/U in September as before, call if you need me sooner  Antibiotic is prescribed for sore on stomach  Prednisone is prescribed for rash on your back and right thigh pain   Thanks for choosing [facility], we consider it a privelige to serve you.

## 2022-04-27 NOTE — Assessment & Plan Note (Signed)
Elevated at viist will reassess in 8 weeks DASH diet and commitment to daily physical activity for a minimum of 30 minutes discussed and encouraged, as a part of hypertension management. The importance of attaining a healthy weight is also discussed.     04/27/2022    3:34 PM 02/10/2022   11:30 AM 02/06/2022    1:48 PM 01/25/2022    1:00 PM 01/18/2022   11:00 AM 12/10/2021    1:26 PM 09/07/2021   12:58 PM  BP/Weight  Systolic BP 619 509 326 [phone] 833  Diastolic BP 98 62 81 75 72 87 72  Wt. (Lbs) 127.04 127.2  128 127.6 129.04 126  BMI 23.24 kg/m2 24.03 kg/m2  24.19 kg/m2 24.92 kg/m2 24.38 kg/m2 23.81 kg/m2

## 2022-04-27 NOTE — Assessment & Plan Note (Signed)
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

## 2022-04-27 NOTE — Progress Notes (Signed)
   Toni Miller     MRN: [id number]      DOB: 05-15-1961   HPI Toni Miller is here c/o sore area on abdomen where she pulled at black lesion 1 week ago, unsure if a tick or a mole, no generalized fever or body aches but area is red and sore C/o itchy rashon back x 2 weks, good response to hydrocortisone cream but some areas are still itch and roug C/o right anterior thigh pain and discomfort into groin, no inciting factor ROS Denies recent fever or chills. Denies sinus pressure, nasal congestion, ear pain or sore throat. Denies chest congestion, productive cough or wheezing. Denies chest pains, palpitations and leg swelling Denies abdominal pain, nausea, vomiting,diarrhea or constipation.   Denies dysuria, frequency, hesitancy or incontinence. Denies joint pain, swelling and limitation in mobility. Denies headaches, seizures, numbness, or tingling. Denies depression, anxiety or insomnia.  PE  BP (!) 151/98   Pulse 86   Ht '5\' 2"'$  (1.575 m)   Wt 127 lb 0.6 oz (57.6 kg)   SpO2 96%   BMI 23.24 kg/m   Patient alert and oriented and in no cardiopulmonary distress.  HEENT: No facial asymmetry, EOMI,     Neck supple .  Chest: Clear to auscultation bilaterally.  CVS: S1, S2 no murmurs, no S3.Regular rate.  ABD: Soft non tender.   Ext: No edema  MS: Adequate ROM spine, shoulders, hips and knees.  Skin: erythema, and superficial ulcer on anterior abdomen where pt had recent trauma, no purulent drainage Papular rash on back Psych: Good eye contact, normal affect. Memory intact not anxious or depressed appearing.  CNS: CN 2-12 intact, power,  normal throughout.no focal deficits noted.   Assessment & Plan  Cellulitis 5 day course of doxycyline prescribed  Hypertension Elevated at viist will reassess in 8 weeks DASH diet and commitment to daily physical activity for a minimum of 30 minutes discussed and encouraged, as a part of hypertension management. The importance of  attaining a healthy weight is also discussed.     04/27/2022    3:34 PM 02/10/2022   11:30 AM 02/06/2022    1:48 PM 01/25/2022    1:00 PM 01/18/2022   11:00 AM 12/10/2021    1:26 PM 09/07/2021   12:58 PM  BP/Weight  Systolic BP 923 300 762 263 335 456 256  Diastolic BP 98 62 81 75 72 87 72  Wt. 

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
## 2022-05-02 ENCOUNTER — Other Ambulatory Visit: Payer: Self-pay | Admitting: Family Medicine

## 2022-05-05 ENCOUNTER — Encounter: Payer: Self-pay | Admitting: Psychiatry

## 2022-05-05 ENCOUNTER — Telehealth (INDEPENDENT_AMBULATORY_CARE_PROVIDER_SITE_OTHER): Payer: 59 | Admitting: Psychiatry

## 2022-05-05 DIAGNOSIS — F3341 Major depressive disorder, recurrent, in partial remission: Secondary | ICD-10-CM

## 2022-05-05 MED ORDER — VENLAFAXINE HCL ER 37.5 MG PO CP24: 37.5000 mg | ORAL_CAPSULE | Freq: Every day | ORAL | 0 refills | Status: DC

## 2022-05-05 NOTE — Patient Instructions (Signed)
1. Continue venlafaxine 37.5 mg daily 2. Next appointment: 10/27 at 11 AM

## 2022-05-13 ENCOUNTER — Other Ambulatory Visit: Payer: Self-pay | Admitting: Family Medicine

## 2022-05-17 ENCOUNTER — Encounter: Payer: Self-pay | Admitting: Gastroenterology

## 2022-05-17 ENCOUNTER — Ambulatory Visit (INDEPENDENT_AMBULATORY_CARE_PROVIDER_SITE_OTHER): Payer: 59 | Admitting: Gastroenterology

## 2022-05-17 VITALS — BP 116/79 | HR 78 | Temp 98.6°F | Ht 61.0 in | Wt 129.0 lb

## 2022-05-17 DIAGNOSIS — K5909 Other constipation: Secondary | ICD-10-CM

## 2022-05-17 DIAGNOSIS — K219 Gastro-esophageal reflux disease without esophagitis: Secondary | ICD-10-CM

## 2022-05-17 NOTE — Patient Instructions (Signed)
Continue Linzess and high fiber diet as you are doing!  We will see you back in 1 year or sooner if needed!  I enjoyed seeing you again today! As you know, I value our relationship and want to provide genuine, compassionate, and quality care. I welcome your feedback. If you receive a survey regarding your visit,  I greatly appreciate you taking time to fill this out. See you next time!  Annitta Needs, PhD, ANP-BC [facility]

## 2022-05-17 NOTE — Progress Notes (Signed)
Gastroenterology Office Note     Primary Care Physician:  [name], MD  Primary Gastroenterologist: Dr. Abbey Chatters    Chief Complaint   Chief Complaint  Patient presents with   Follow-up     History of Present Illness   Toni Miller is a 61 y.o. female presenting today in follow-up with a history of chronic constipation, GERD, +H.yplori 2020 with documented eradication via breath test in Nov 2021. She had banding of all 3 columns, with last appt April 2023. Here for routine visit now. Last colonoscopy in 2022 and 10 year screening recommended.    Lots of fiber through food. Taking Linzess145 mcg every other day. No straining. Feels her toilet is too low, so she uses a stool to help prop her feet up. This is helpful. GERD controlled with Nexium.     Past Medical History:  Diagnosis Date   Anxiety    Arthritis    Cancer ([place]) 1993   abnormal pap treated at [facility]   Constipation    Depression    GERD (gastroesophageal reflux disease)    Glaucoma 1993   History of kidney stones    Hot flashes, menopausal 06/12/2011   Hyperlipidemia    Hypertension    Neck pain 7/09   WITH BULGING DISC-----RECIEVING EPIDURALS    Sinusitis     Past Surgical History:  Procedure Laterality Date   ABDOMINAL EXPLORATION SURGERY  age 45   bowel obstruction, [facility]   ABDOMINAL HYSTERECTOMY  2007   [facility], [facility]   BILATERAL EYE SURGERY     for glaucoma   BIOPSY  01/09/2019   Procedure: BIOPSY;  Surgeon: Danie Binder, MD;  Location: [facility];  Service: Endoscopy;;  gstric    bone removed from under tongue     BREAST BIOPSY Left    benign   BREAST SURGERY Left 2004   left partial mastectomy, [facility]   CATARACT EXTRACTION Right 06/04/2015   CATARACT EXTRACTION W/PHACO  05/16/2011   Procedure: CATARACT EXTRACTION PHACO AND INTRAOCULAR LENS PLACEMENT ([place]);  Surgeon: Tonny Branch;  Location: [facility];  Service: Ophthalmology;  Laterality: Right;   COLONOSCOPY N/A 05/17/2013    Dr. Barnie Alderman diverticulosis was noted/small internal hemorrhoids   COLONOSCOPY WITH PROPOFOL N/A 04/12/2021   Colonoscopy June 2022: small anal sphincter s/p rectal surgery, internal hemorrhoids, one 2 mm polyp in cecum (benign). 10 year surveillance.   CYST REMOVED LEFT BREAST /BENIGN  2005   left, [facility]   ESOPHAGOGASTRODUODENOSCOPY N/A 01/09/2019   normal esophagus. Mild NSAID gastritis s/p biopsy. +H.pylori. treated with amoxicillin, biaxin, Nexium.Marland Kitchen ERADICATION DOCUMENTED   EYE SURGERY  2010, 1992 approx   bilateral   POLYPECTOMY  04/12/2021   Procedure: POLYPECTOMY;  Surgeon: Eloise Harman, DO;  Location: [facility];  Service: Endoscopy;;   REPAIR IMPERFORATE ANUS / ANORECTOPLASTY     per patient   TOTAL ABDOMINAL HYSTERECTOMY W/ BILATERAL SALPINGOOPHORECTOMY  2007   [facility], [facility]   TUBAL LIGATION  1991    Current Outpatient Medications  Medication Sig Dispense Refill   calcium-vitamin D (OSCAL-500) 500-400 MG-UNIT tablet Take 1 tablet by mouth 2 (two) times daily. 60 tablet 5   cycloSPORINE (RESTASIS) 0.05 % ophthalmic emulsion Place 1 drop into both eyes 2 (two) times daily.     doxycycline (VIBRA-TABS) 100 MG tablet Take 1 tablet (100 mg total) by mouth 2 (two) times daily. 10 tablet 0   esomeprazole (NEXIUM) 40 MG capsule TAKE ONE  CAPSULE BY MOUTH ONCE OR TWICE DAILY BEFORE A MEAL (VIAL) 60 capsule 11   gabapentin (NEURONTIN) 300 MG capsule TAKE 1 CAPSULE BY MOUTH TWICE DAILY. 60 capsule 0   hydrocortisone (ANUSOL-HC) 2.5 % rectal cream APPLY (1) APPLICATION TWICE DAILY ( FOR RECTAL ITCHING AND BURNING) 30 g 0   hydrOXYzine (ATARAX) 10 MG tablet TAKE ONE TABLET BY MOUTH ONCE DAILY AS NEEDED FOR ITCHING. 30 tablet 2   linaclotide (LINZESS) 145 MCG CAPS capsule Take 145 mcg by mouth every other day.     meloxicam (MOBIC) 7.5 MG tablet Take 1 tablet (7.5 mg total) by mouth daily. 30 tablet 0   montelukast (SINGULAIR) 10 MG tablet TAKE ONE TABLET BY MOUTH DAILY 90 tablet 0    Multiple Vitamin (MULTIVITAMIN) capsule Take 1 capsule by mouth daily.     rosuvastatin (CRESTOR) 10 MG tablet TAKE (1) TABLET BY MOUTH AT BEDTIME. 90 tablet 0   tiZANidine (ZANAFLEX) 4 MG tablet Take 1 tablet (4 mg total) by mouth at bedtime. 30 tablet 0   TRAVATAN Z 0.004 % SOLN ophthalmic solution Place 1 drop into both eyes at bedtime.     triamterene-hydrochlorothiazide (MAXZIDE-25) 37.5-25 MG tablet TAKE (1 & 1/2) TABLETS BY MOUTH DAILY FOR BLOOD PRESSURE. 135 tablet 0   [START ON 06/19/2022] venlafaxine XR (EFFEXOR-XR) 37.5 MG 24 hr capsule Take 1 capsule (37.5 mg total) by mouth daily. 90 capsule 0   No current facility-administered medications for this visit.    Allergies as of 05/17/2022 - Review Complete 05/17/2022  Allergen Reaction Noted   Acyclovir and related Rash 03/02/2015    Family History  Adopted: Yes  Problem Relation Age of Onset   Alcohol abuse Mother    Stomach cancer Mother 58   Lung cancer Father    Glaucoma Father    Alcohol abuse Father    Seizures Father    Breast cancer Sister 75   Breast cancer Sister 75   Heart disease Sister    Heart attack Sister    Glaucoma Son    Glaucoma Maternal Grandfather    Cancer Maternal Grandfather        poss leukemia   SIDS Brother    Hyperthyroidism Daughter    Seizures Son        as a Sport and exercise psychologist   Kidney disease Maternal Grandmother    Heart disease Maternal Grandmother        Psychologist, forensic   Leukemia Maternal Grandmother    Colon cancer Neg Hx     Social History   Socioeconomic History   Marital status: Significant Other    Spouse name: Not on file   Number of children: 3   Years of education: 12   Highest education level: 12th grade  Occupational History   Occupation: UNEMPLOYED 2011    Comment: disability    Employer: DISABLED  Tobacco Use   Smoking status: Former    Packs/day: 3.00    Years: 2.00    Total pack years: 6.00    Types: Cigarettes    Quit date: 05/12/1991    Years since quitting: 31.0    Smokeless tobacco: Never  Vaping Use   Vaping Use: Never used  Substance and Sexual Activity   Alcohol use: Yes    Comment: occasionally   Drug use: Never   Sexual activity: Yes    Birth control/protection: Surgical  Other Topics Concern   Not on file  Social History Narrative   Lives 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   caffien- coffee, 1 cup daily   Social Determinants of Health   Financial Resource Strain: Low Risk  (09/07/2021)   Overall Financial Resource Strain (CARDIA)    Difficulty of Paying Living Expenses: Not hard at all  Food Insecurity: No Food Insecurity (09/07/2021)   Hunger Vital Sign    Worried About Running Out of Food in the Last Year: Never true    Ran Out of Food in the Last Year: Never true  Transportation Needs: No Transportation Needs (09/07/2021)   PRAPARE - Hydrologist (Medical): No    Lack of Transportation (Non-Medical): No  Physical Activity: Insufficiently Active (09/07/2021)   Exercise Vital Sign    Days of Exercise per Week: 3 days    Minutes of Exercise per Session: 20 min  Stress: No Stress Concern Present (09/07/2021)   [place]    Feeling of Stress : Not at all  Social Connections: Moderately Isolated (09/07/2021)   Social Connection and Isolation Panel [NHANES]    Frequency of Communication with Friends and Family: Three times a week    Frequency of Social Gatherings with Friends and Family: Twice a week    Attends Religious Services: 1 to 4 times per year    Active Member of [employer] or Organizations: No    Attends Archivist Meetings: Never    Marital Status: Divorced  Human resources officer Violence: Not At Risk (09/07/2021)   Humiliation, Afraid, Rape, and Kick questionnaire    Fear of Current or Ex-Partner: No    Emotionally Abused: No    Physically Abused: No    Sexually Abused: No     Review of Systems   Gen: Denies any fever,  chills, fatigue, weight loss, lack of appetite.  CV: Denies chest pain, heart palpitations, peripheral edema, syncope.  Resp: Denies shortness of breath at rest or with exertion. Denies wheezing or cough.  GI: see HPI  GU : Denies urinary burning, urinary frequency, urinary hesitancy MS: Denies joint pain, muscle weakness, cramps, or limitation of movement.  Derm: Denies rash, itching, dry skin Psych: Denies depression, anxiety, memory loss, and confusion Heme: Denies bruising, bleeding, and enlarged lymph nodes.   Physical Exam   BP 116/79 (BP Location: Right Arm, Patient Position: Sitting, Cuff Size: Normal)   Pulse 78   Temp 98.6 F (37 C) (Temporal)   Ht '5\' 1"'$  (1.549 m)   Wt 129 lb (58.5 kg)   BMI 24.37 kg/m  General:   Alert and oriented. Pleasant and cooperative. Well-nourished and well-developed.  Head:  Normocephalic and atraumatic. Eyes:  Without icterus Abdomen:  +BS, soft, non-tender and non-distended. No HSM noted. No guarding or rebound. No masses appreciated.  Rectal:  Deferred  Msk:  Symmetrical without gross deformities. Normal posture. Extremities:  Without edema. Neurologic:  Alert and  oriented x4;  grossly normal neurologically. Skin:  Intact without significant lesions or rashes. Psych:  Alert and cooperative. Normal mood and affect.   Assessment   Toni Miller is a 61 y.o. female presenting today in follow-up with a history of chronic constipation, GERD, +H.yplori 2020 with documented eradication via breath test in Nov 2021, and prior hemorrhoid banding.  Constipation: doing well with dietary fiber and Linzess 145 mcg ever other day. Hemorrhoid symptoms resolved s/p banding.  GERD: doing well on Nexium without alarm signs/symptoms.   PLAN    As she is doing well, return  in 1 year Continue Nexium Continue Linzess novel dosing of every other day. Continue increased fiber intake Colonoscopy 2033   Annitta Needs, PhD, ANP-BC [facility]  Gastroenterology

## 2022-06-10 ENCOUNTER — Other Ambulatory Visit: Payer: Self-pay | Admitting: Family Medicine

## 2022-06-15 ENCOUNTER — Other Ambulatory Visit: Payer: Self-pay

## 2022-06-15 ENCOUNTER — Telehealth: Payer: Self-pay | Admitting: Family Medicine

## 2022-06-15 MED ORDER — GABAPENTIN 300 MG PO CAPS: 300.0000 mg | ORAL_CAPSULE | Freq: Two times a day (BID) | ORAL | 2 refills | Status: DC

## 2022-06-15 NOTE — Telephone Encounter (Signed)
Patient needs refill on gabapentin (NEURONTIN) 300 MG capsule   Wants a call back

## 2022-06-15 NOTE — Telephone Encounter (Signed)
Refills sent

## 2022-06-17 NOTE — Telephone Encounter (Signed)
Med is refilled

## 2022-07-08 ENCOUNTER — Other Ambulatory Visit (HOSPITAL_COMMUNITY)
Admission: RE | Admit: 2022-07-08 | Discharge: 2022-07-08 | Disposition: A | Discharge status: Home / Self Care | Payer: 59 | Source: Ambulatory Visit | Attending: Family Medicine | Admitting: Family Medicine

## 2022-07-08 ENCOUNTER — Ambulatory Visit (INDEPENDENT_AMBULATORY_CARE_PROVIDER_SITE_OTHER): Payer: 59 | Admitting: Family Medicine

## 2022-07-08 ENCOUNTER — Encounter: Payer: Self-pay | Admitting: Family Medicine

## 2022-07-08 VITALS — BP 120/84 | HR 81 | Ht 61.0 in | Wt 127.1 lb

## 2022-07-08 DIAGNOSIS — M542 Cervicalgia: Secondary | ICD-10-CM

## 2022-07-08 DIAGNOSIS — Z13220 Encounter for screening for lipoid disorders: Secondary | ICD-10-CM

## 2022-07-08 DIAGNOSIS — G8929 Other chronic pain: Secondary | ICD-10-CM

## 2022-07-08 DIAGNOSIS — Z124 Encounter for screening for malignant neoplasm of cervix: Secondary | ICD-10-CM

## 2022-07-08 DIAGNOSIS — E559 Vitamin D deficiency, unspecified: Secondary | ICD-10-CM

## 2022-07-08 DIAGNOSIS — Z1151 Encounter for screening for human papillomavirus (HPV): Secondary | ICD-10-CM | POA: Insufficient documentation

## 2022-07-08 DIAGNOSIS — Z0001 Encounter for general adult medical examination with abnormal findings: Secondary | ICD-10-CM

## 2022-07-08 DIAGNOSIS — M5442 Lumbago with sciatica, left side: Secondary | ICD-10-CM | POA: Diagnosis not present

## 2022-07-08 DIAGNOSIS — Z23 Encounter for immunization: Secondary | ICD-10-CM

## 2022-07-08 DIAGNOSIS — I10 Essential (primary) hypertension: Secondary | ICD-10-CM | POA: Diagnosis not present

## 2022-07-08 DIAGNOSIS — Z01419 Encounter for gynecological examination (general) (routine) without abnormal findings: Secondary | ICD-10-CM | POA: Diagnosis present

## 2022-07-08 MED ORDER — KETOROLAC TROMETHAMINE 60 MG/2ML IM SOLN
60.0000 mg | Freq: Once | INTRAMUSCULAR | Status: AC
  Administered 2022-07-08: 60 mg via INTRAMUSCULAR

## 2022-07-08 NOTE — Patient Instructions (Addendum)
F/U in 5 months, call if you need me sooner  Flu vaccine today  Toradol 60 mg iM for neck  and back pain  You will be referred to Podiatry for ingrown toenail, right big toe  Lipid, cmp and eGFR, TSH, vit D and CBC fasting labs next week  Please get covid booster when available  Keep active, and keep cooking!  Thanks for choosing [facility], we consider it a privelige to serve you.

## 2022-07-10 ENCOUNTER — Encounter: Payer: Self-pay | Admitting: Family Medicine

## 2022-07-10 DIAGNOSIS — Z0001 Encounter for general adult medical examination with abnormal findings: Secondary | ICD-10-CM | POA: Insufficient documentation

## 2022-07-10 NOTE — Assessment & Plan Note (Signed)
Increased a nd uncontrolled pain x 2 weeks, toradol 60 mg IM in office

## 2022-07-10 NOTE — Assessment & Plan Note (Signed)
Increased and uncontrolled pain, toradol 60 mg IM

## 2022-07-10 NOTE — Progress Notes (Signed)
    Toni Miller     MRN: [id number]      DOB: 10-15-1961  HPI: Patient is in for annual physical exam. 2 week h/o unprovoked increased neck and low back pain. Immunization is reviewed , and  updated if needed.   PE: BP 120/84   Pulse 81   Ht '5\' 1"'$  (1.549 m)   Wt 127 lb 1.9 oz (57.7 kg)   SpO2 93%   BMI 24.02 kg/m   Pleasant  female, alert and oriented x 3, in no cardio-pulmonary distress. Afebrile. HEENT No facial trauma or asymetry. Sinuses non tender.  Extra occullar muscles intact.. External ears normal, . Neck: decreased ROM with left spasm, no adenopathy,JVD or thyromegaly.No bruits.  Chest: Clear to ascultation bilaterally.No crackles or wheezes. Non tender to palpation  Breast: No asymetry,no masses or lumps. No tenderness. No nipple discharge or inversion. No axillary or supraclavicular adenopathy  Cardiovascular system; Heart sounds normal,  S1 and  S2 ,no S3.  No murmur, or thrill. Apical beat not displaced Peripheral pulses normal.  Abdomen: Soft, non tender, no organomegaly or masses. No bruits. Bowel sounds normal. No guarding, tenderness or rebound.   GU: External genitalia normal female genitalia , normal female distribution of hair. No lesions. Urethral meatus normal in size, no  Prolapse, no lesions visibly  Present. Bladder non tender. Vagina pink and moist , with no visible lesions , discharge present . Adequate pelvic support no  cystocele or rectocele noted Cervix absent, no lesions or ulcerations noted, no discharge noted from os Uterus absent no adnexal masses, no  adnexal tenderness.   Musculoskeletal exam: Decreased  ROM of lumbar spine, normal in hips , shoulders and  knees. No deformity ,swelling or crepitus noted. No muscle wasting or atrophy.   Neurologic: Cranial nerves 2 to 12 intact. Power, tone ,sensation and reflexes normal throughout. No disturbance in gait. No tremor.  Skin: Intact, no ulceration, erythema ,  scaling or rash noted. 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 Judgement and concentration normal   Assessment & Plan:  Encounter for annual general medical examination with abnormal findings in adult Annual exam as documented. Counseling done  re healthy lifestyle involving commitment to 150 minutes exercise per week, heart healthy diet, and attaining healthy weight.The importance of adequate sleep also discussed. Regular seat belt use and home safety, is also discussed. Changes in health habits are decided on by the patient with goals and time frames  set for achieving them. Immunization and cancer screening needs are specifically addressed at this visit.   Low back pain with left-sided sciatica Increased a nd uncontrolled pain x 2 weeks, toradol 60 mg IM in office  Neck pain Increased and uncontrolled pain, toradol 60 mg IM

## 2022-07-10 NOTE — Assessment & Plan Note (Signed)

## 2022-07-13 ENCOUNTER — Other Ambulatory Visit: Payer: Self-pay | Admitting: Family Medicine

## 2022-07-14 LAB — CMP14+EGFR
ALT: 19 IU/L (ref 0–32)
AST: 24 IU/L (ref 0–40)
Albumin/Globulin Ratio: 1.7 (ref 1.2–2.2)
Albumin: 4.5 g/dL (ref 3.8–4.9)
Alkaline Phosphatase: 73 IU/L (ref 44–121)
BUN/Creatinine Ratio: 13 (ref 9–23)
BUN: 13 mg/dL (ref 6–24)
Bilirubin Total: 0.5 mg/dL (ref 0.0–1.2)
CO2: 26 mmol/L (ref 20–29)
Calcium: 9.6 mg/dL (ref 8.7–10.2)
Chloride: 100 mmol/L (ref 96–106)
Creatinine, Ser: 0.97 mg/dL (ref 0.57–1.00)
Globulin, Total: 2.7 g/dL (ref 1.5–4.5)
Glucose: 79 mg/dL (ref 70–99)
Potassium: 3.9 mmol/L (ref 3.5–5.2)
Sodium: 140 mmol/L (ref 134–144)
Total Protein: 7.2 g/dL (ref 6.0–8.5)
eGFR: 67 mL/min/{1.73_m2} (ref >59)

## 2022-07-14 LAB — LIPID PANEL
Chol/HDL Ratio: 2.6 ratio (ref 0.0–4.4)
Cholesterol, Total: 175 mg/dL (ref 100–199)
HDL: 68 mg/dL (ref >39)
LDL Chol Calc (NIH): 92 mg/dL (ref 0–99)
Triglycerides: 82 mg/dL (ref 0–149)
VLDL Cholesterol Cal: 15 mg/dL (ref 5–40)

## 2022-07-14 LAB — TSH: TSH: 1.27 u[IU]/mL (ref 0.450–4.500)

## 2022-07-14 LAB — CBC
Hematocrit: 41.1 % (ref 34.0–46.6)
Hemoglobin: 14.1 g/dL (ref 11.1–15.9)
MCH: 30 pg (ref 26.6–33.0)
MCHC: 34.3 g/dL (ref 31.5–35.7)
MCV: 87 fL (ref 79–97)
Platelets: 271 10*3/uL (ref 150–450)
RBC: 4.7 x10E6/uL (ref 3.77–5.28)
RDW: 12.9 % (ref 11.7–15.4)
WBC: 5.5 10*3/uL (ref 3.4–10.8)

## 2022-07-14 LAB — CYTOLOGY - PAP
Comment: NEGATIVE
Diagnosis: NEGATIVE
High risk HPV: NEGATIVE

## 2022-07-14 LAB — VITAMIN D 25 HYDROXY (VIT D DEFICIENCY, FRACTURES): Vit D, 25-Hydroxy: 47.6 ng/mL (ref 30.0–100.0)

## 2022-07-27 ENCOUNTER — Other Ambulatory Visit: Payer: Self-pay | Admitting: Family Medicine

## 2022-07-27 ENCOUNTER — Telehealth: Payer: Self-pay

## 2022-07-27 DIAGNOSIS — M79674 Pain in right toe(s): Secondary | ICD-10-CM

## 2022-07-27 MED ORDER — ESTROGENS CONJUGATED 0.625 MG/GM VA CREA: TOPICAL_CREAM | VAGINAL | 1 refills | Status: DC

## 2022-07-27 NOTE — Telephone Encounter (Signed)
Patient called still waiting on the medicine to be called into her pharmacy to use for dryness as was discuss.  Pharmacy: [facility]

## 2022-07-27 NOTE — Progress Notes (Signed)
B pod

## 2022-07-27 NOTE — Telephone Encounter (Signed)
Patient called asking when and where will patient be referred out to the doctor for her in grown toe nail.

## 2022-07-28 ENCOUNTER — Ambulatory Visit (HOSPITAL_COMMUNITY)
Admission: RE | Admit: 2022-07-28 | Discharge: 2022-07-28 | Disposition: A | Discharge status: Home / Self Care | Payer: 59 | Source: Ambulatory Visit | Attending: Family Medicine | Admitting: Family Medicine

## 2022-07-28 DIAGNOSIS — Z1231 Encounter for screening mammogram for malignant neoplasm of breast: Secondary | ICD-10-CM | POA: Insufficient documentation

## 2022-08-10 ENCOUNTER — Encounter: Payer: Self-pay | Admitting: Podiatry

## 2022-08-10 ENCOUNTER — Ambulatory Visit (INDEPENDENT_AMBULATORY_CARE_PROVIDER_SITE_OTHER): Payer: 59 | Admitting: Podiatry

## 2022-08-10 DIAGNOSIS — L600 Ingrowing nail: Secondary | ICD-10-CM

## 2022-08-10 NOTE — Patient Instructions (Signed)

## 2022-08-11 NOTE — Progress Notes (Signed)
Subjective:   Patient ID: Toni Miller, female   DOB: 61 y.o.   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  She signed consent form after reviewing today infiltrated each big toe 60 mg like Marcaine mixture sterile prep and using sterile instrumentation removed medial border right hallux lateral border left hallux exposed matrix applied phenol 3 applications 30 seconds followed by alcohol lavage sterile dressing to each border and advised on leaving dressing on 24 hours take it off earlier if throbbing were to occur and encouraged to call questions concerns which may arise

## 2022-08-15 ENCOUNTER — Other Ambulatory Visit: Payer: Self-pay | Admitting: Family Medicine

## 2022-08-15 ENCOUNTER — Other Ambulatory Visit: Payer: Self-pay

## 2022-08-15 ENCOUNTER — Telehealth: Payer: Self-pay | Admitting: Family Medicine

## 2022-08-15 MED ORDER — FLUTICASONE PROPIONATE 50 MCG/ACT NA SUSP
1.0000 | Freq: Every day | NASAL | 2 refills | Status: DC | PRN
Start: 2022-08-15 — End: 2023-03-15

## 2022-08-15 NOTE — Telephone Encounter (Signed)
This has been refilled.

## 2022-08-15 NOTE — Telephone Encounter (Signed)
Med refilled.

## 2022-08-15 NOTE — Telephone Encounter (Signed)
Patient called in needs refill on triamterene-hydrochlorothiazide (MAXZIDE-25) 37.5-25 MG tablet   Also wants to have  [facility] refilled   Patient also wanted to say thank you for [facility] referral to have feet done.

## 2022-08-15 NOTE — Telephone Encounter (Signed)
Pt called stating she is needing a refill on riamterene-hydrochlorothiazide (MAXZIDE-25) 37.5-25 MG tablet . Can you please refill?

## 2022-08-30 ENCOUNTER — Ambulatory Visit (INDEPENDENT_AMBULATORY_CARE_PROVIDER_SITE_OTHER): Payer: 59 | Admitting: Internal Medicine

## 2022-08-30 ENCOUNTER — Encounter: Payer: Self-pay | Admitting: Internal Medicine

## 2022-08-30 DIAGNOSIS — Z0000 Encounter for general adult medical examination without abnormal findings: Secondary | ICD-10-CM | POA: Diagnosis not present

## 2022-08-30 IMAGING — MG MM DIGITAL SCREENING BILAT W/ TOMO AND CAD
6 of 12 series · 6 of 36 positions shown · non-contrast
Comparison: Previous exam(s).

CLINICAL DATA: Screening.

EXAM:
DIGITAL SCREENING BILATERAL MAMMOGRAM WITH TOMOSYNTHESIS AND CAD
TECHNIQUE: Bilateral screening digital craniocaudal and mediolateral oblique
mammograms were obtained. Bilateral screening digital breast
tomosynthesis was performed. The images were evaluated with
computer-aided detection.

[R MLO synth-2D]
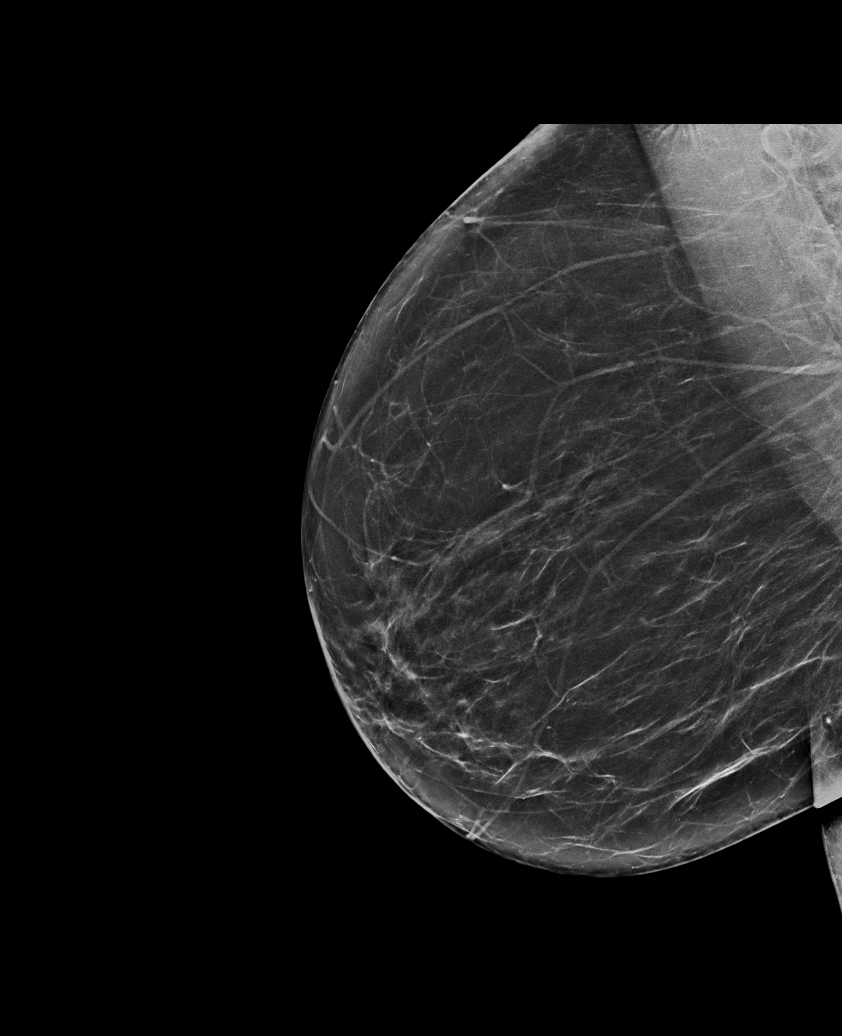

[L MLO synth-2D (1 of 2)]
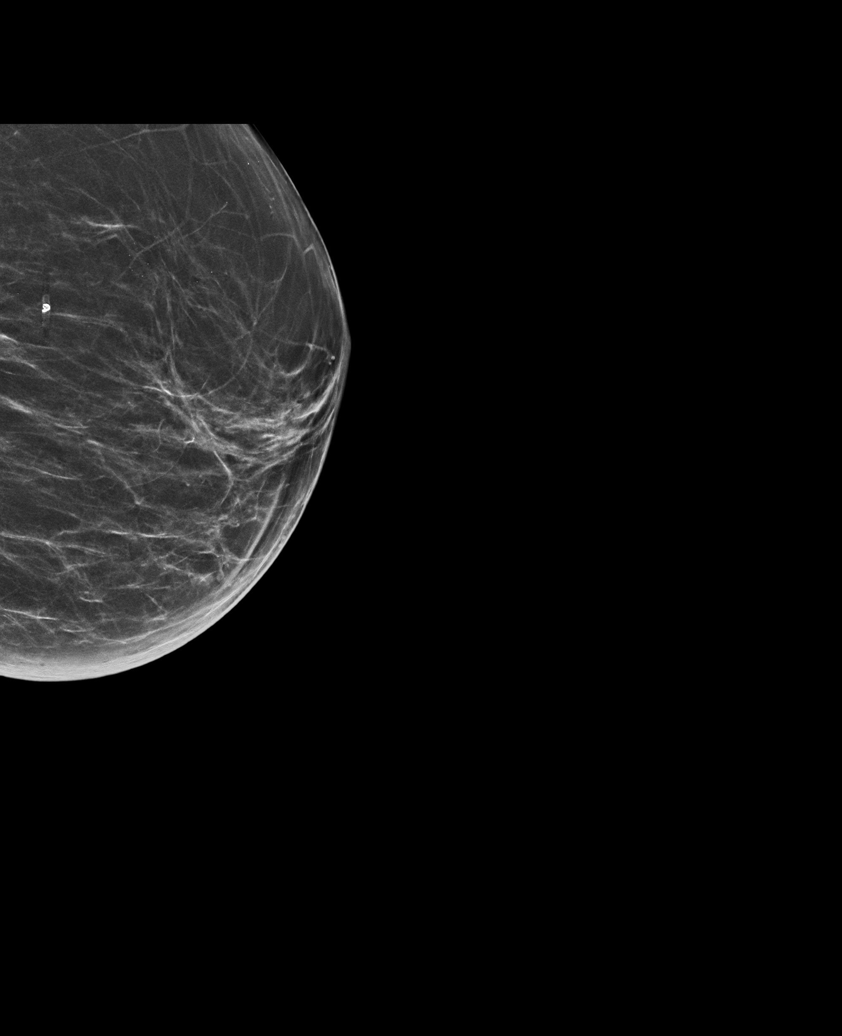

[L CC synth-2D]
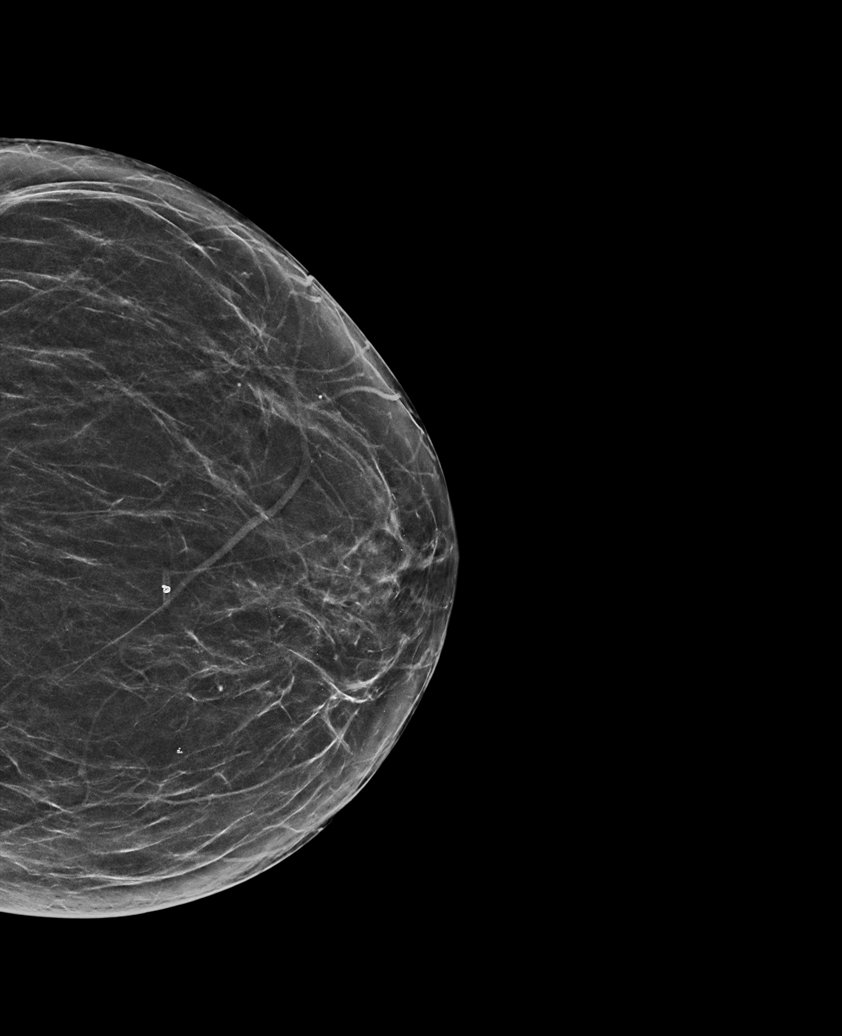

[R CC synth-2D (1 of 2)]
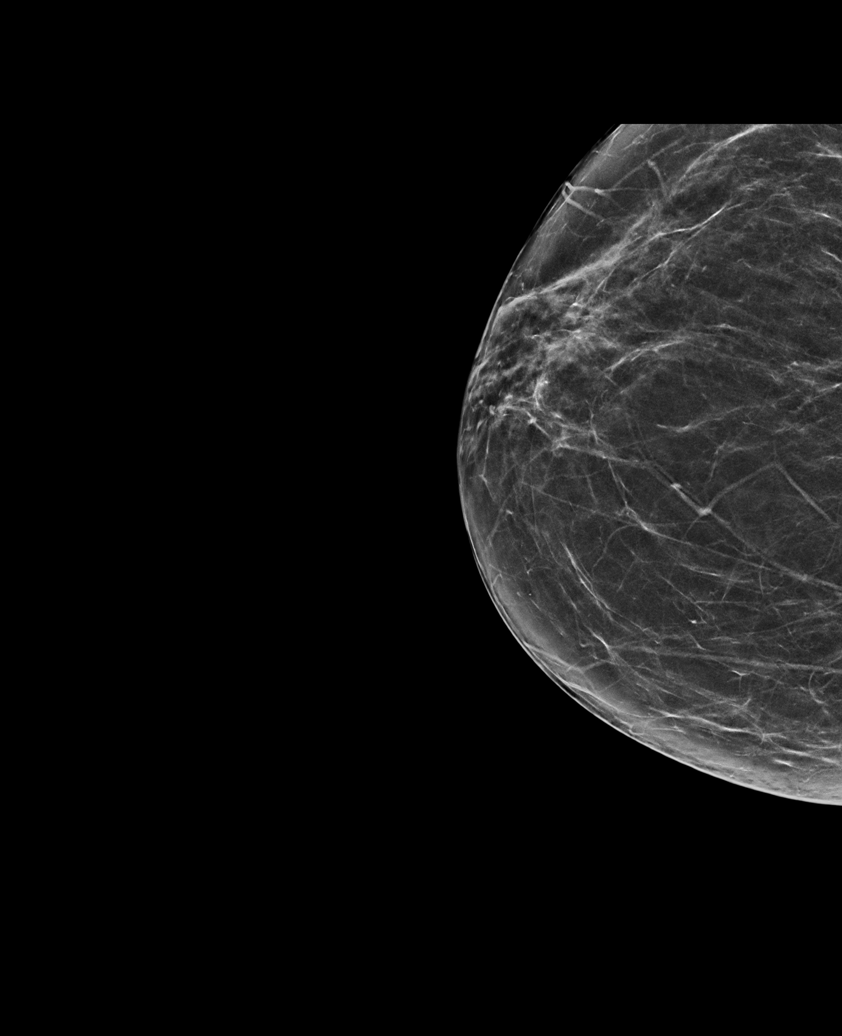

[R CC synth-2D (2 of 2)]
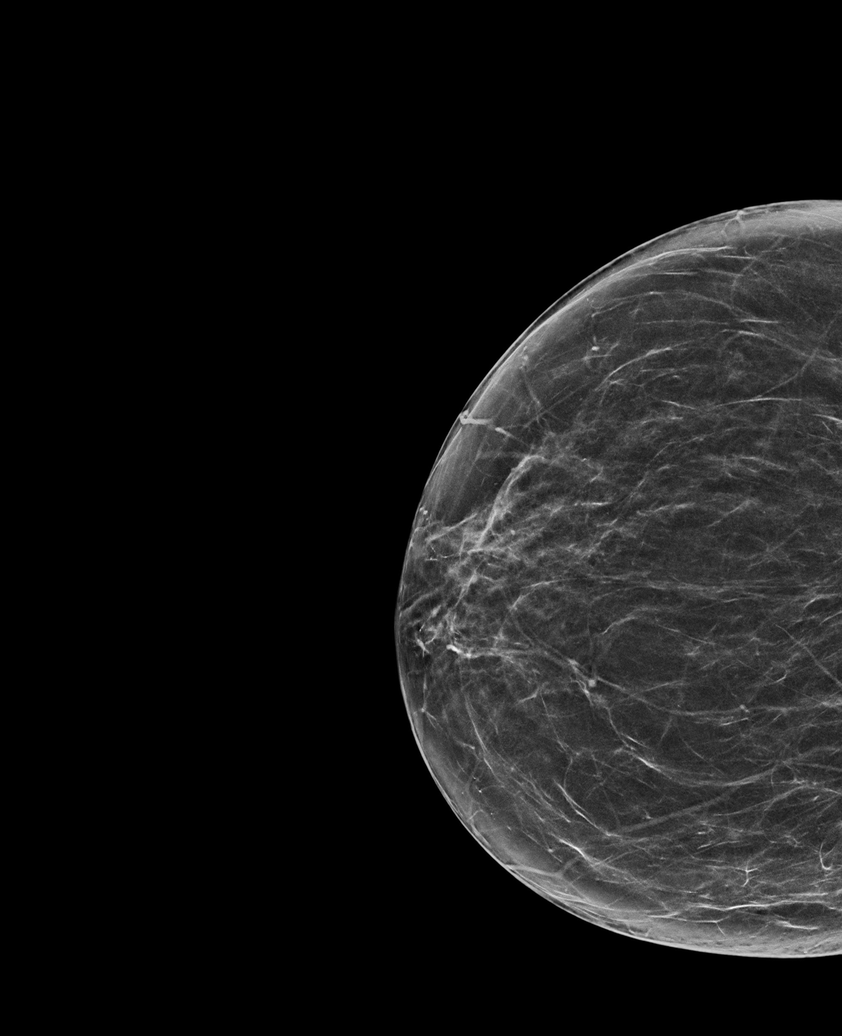

[L MLO synth-2D (2 of 2)]
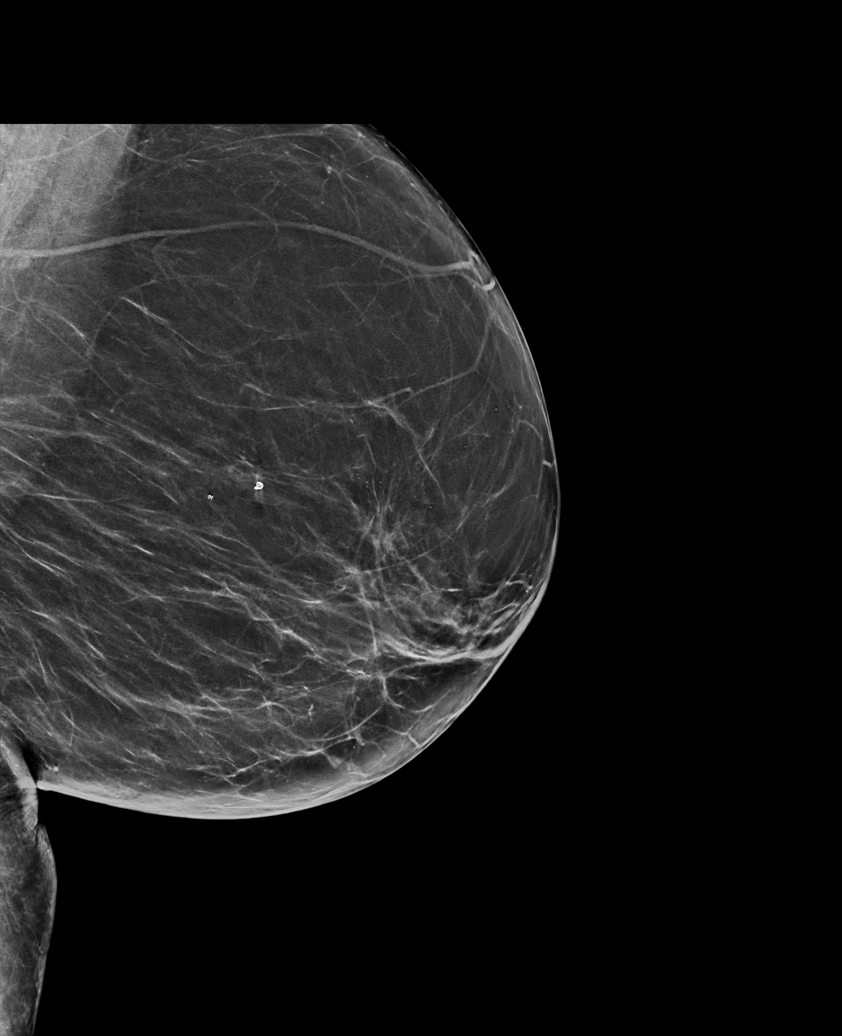

[6 of 36 positions shown; findings below may reference images not displayed]

ACR Breast Density Category b: There are scattered areas of
fibroglandular density.
FINDINGS: There are no findings suspicious for malignancy.
IMPRESSION: No mammographic evidence of malignancy. A result letter of this
screening mammogram will be mailed directly to the patient.

RECOMMENDATION:
Screening mammogram in one year. (Code:51-O-LD2)

BI-RADS CATEGORY  1: Negative.

## 2022-08-30 MED ORDER — TRAVATAN Z 0.004 % OP SOLN: 1.0000 [drp] | Freq: Every day | OPHTHALMIC | Status: DC

## 2022-08-30 NOTE — Patient Instructions (Signed)
  Ms. Renninger , Thank you for taking time to come for your Medicare Wellness Visit. I appreciate your ongoing commitment to your health goals. Please review the following plan we discussed and let me know if I can assist you in the future.   These are the goals we discussed:  Goals      Activity and Exercise Increased     Evidence-based guidance:  Review current exercise levels.  Assess patient perspective on exercise or activity level, barriers to increasing activity, motivation and readiness for change.  Recommend or set healthy exercise goal based on individual tolerance.  Encourage small steps toward making change in amount of exercise or activity.  Urge reduction of sedentary activities or screen time.  Promote group activities within the community or with family or support person.  Consider referral to rehabiliation therapist for assessment and exercise/activity plan.   Notes: Clean more and help friend as well.      DIET - DECREASE SODA OR JUICE INTAKE     DIET - REDUCE SUGAR INTAKE     Exercise 3x per week (30 min per time)     Recommend starting a routine exercise program at least 3 days a week for 30-45 minutes at a time as tolerated.       Patient Stated     Would like to do more crafts.        This is a list of the screening recommended for you and due dates:  Health Maintenance  Topic Date Due   COVID-19 Vaccine (3 - Moderna series) 04/19/2021   Medicare Annual Wellness Visit  10/07/2022   Mammogram  07/28/2024   Tetanus Vaccine  02/23/2025   Pap Smear  07/08/2025   Colon Cancer Screening  04/13/2031   Flu Shot  Completed   Hepatitis C Screening: USPSTF Recommendation to screen - Ages 18-79 yo.  Completed   HIV Screening  Completed   Zoster (Shingles) Vaccine  Completed   HPV Vaccine  Aged Out

## 2022-08-30 NOTE — Progress Notes (Signed)
Subjective:  This is a telephone encounter between Toni Miller and Toni Miller on 08/30/2022 for AWV. The visit was conducted with the patient located at home and Toni Miller at [facility]. The patient's identity was confirmed using their DOB and current address. The patient has consented to being evaluated through a telephone encounter and understands the associated risks (an examination cannot be done and the patient may need to come in for an appointment) / benefits (allows the patient to remain at home, decreasing exposure to coronavirus).      Toni Miller is a 62 y.o. female who presents for Medicare Annual (Subsequent) preventive examination.  Review of Systems    Review of Systems  All other systems reviewed and are negative.    Objective:    Today's Vitals   08/30/22 1458  PainSc: 5    There is no height or weight on file to calculate BMI.     09/07/2021   11:01 AM 04/12/2021    9:39 AM 09/02/2020    3:31 PM 06/29/2020    1:01 PM 08/30/2019    9:40 AM 01/09/2019   11:56 AM 08/27/2018   10:11 AM  Advanced Directives  Does Patient Have a Medical Advance Directive? No Yes No No No No No  Type of Advance Directive  Living will       Would patient like information on creating a medical advance directive? No - Patient declined  No - Patient declined  No - Patient declined Yes (MAU/Ambulatory/Procedural Areas - Information given) Yes (ED - Information included in AVS)    Current Medications (verified) Outpatient Encounter Medications as of 08/30/2022  Medication Sig   calcium-vitamin D (OSCAL-500) 500-400 MG-UNIT tablet Take 1 tablet by mouth 2 (two) times daily.   cholecalciferol (VITAMIN D3) 25 MCG (1000 UNIT) tablet Take 1,000 Units by mouth daily.   conjugated estrogens (PREMARIN) vaginal cream Apply fingertip amount 3 time s weekly as needed, for vaginal dryness   cycloSPORINE (RESTASIS) 0.05 % ophthalmic emulsion Place 1 drop into both eyes 2 (two) times daily.    esomeprazole (NEXIUM) 40 MG capsule TAKE ONE CAPSULE BY MOUTH ONCE OR TWICE DAILY BEFORE A MEAL (VIAL)   fluticasone (FLONASE) 50 MCG/ACT nasal spray Place 1 spray into both nostrils daily as needed for allergies or rhinitis.   gabapentin (NEURONTIN) 300 MG capsule Take 1 capsule (300 mg total) by mouth 2 (two) times daily.   hydrocortisone (ANUSOL-HC) 2.5 % rectal cream APPLY (1) APPLICATION TWICE DAILY ( FOR RECTAL ITCHING AND BURNING)   hydrOXYzine (ATARAX) 10 MG tablet TAKE ONE TABLET BY MOUTH ONCE DAILY AS NEEDED FOR ITCHING.   linaclotide (LINZESS) 145 MCG CAPS capsule Take 145 mcg by mouth every other day.   montelukast (SINGULAIR) 10 MG tablet TAKE ONE TABLET BY MOUTH DAILY   Multiple Vitamin (MULTIVITAMIN) capsule Take 1 capsule by mouth daily.   rosuvastatin (CRESTOR) 10 MG tablet TAKE (1) TABLET BY MOUTH AT BEDTIME.   TRAVATAN Z 0.004 % SOLN ophthalmic solution Place 1 drop into both eyes at bedtime.   triamterene-hydrochlorothiazide (MAXZIDE-25) 37.5-25 MG tablet TAKE (1 & 1/2) TABLETS BY MOUTH DAILY FOR BLOOD PRESSURE.   venlafaxine XR (EFFEXOR-XR) 37.5 MG 24 hr capsule Take 1 capsule (37.5 mg total) by mouth daily.   [DISCONTINUED] meloxicam (MOBIC) 7.5 MG tablet Take 1 tablet (7.5 mg total) by mouth daily.   [DISCONTINUED] tiZANidine (ZANAFLEX) 4 MG tablet Take 1 tablet (4 mg total) by mouth at bedtime.   [  DISCONTINUED] TRAVATAN Z 0.004 % SOLN ophthalmic solution Place 1 drop into both eyes at bedtime.   No facility-administered encounter medications on file as of 08/30/2022.    Allergies (verified) Acyclovir and related   History: Past Medical History:  Diagnosis Date   Anxiety    Arthritis    Cancer ([place]) 1993   abnormal pap [facility] at [facility]   Constipation    Depression    GERD (gastroesophageal reflux disease)    Glaucoma 1993   History of kidney stones    Hot flashes, menopausal 06/12/2011   Hyperlipidemia    Hypertension    Neck pain 7/09   WITH BULGING  DISC-----RECIEVING EPIDURALS    Sinusitis    Past Surgical History:  Procedure Laterality Date   ABDOMINAL EXPLORATION SURGERY  age 42   bowel obstruction, [facility]   ABDOMINAL HYSTERECTOMY  2007   [facility], [facility]   BILATERAL EYE SURGERY     for glaucoma   BIOPSY  01/09/2019   Procedure: BIOPSY;  Surgeon: Danie Binder, MD;  Location: [facility];  Service: Endoscopy;;  gstric    bone removed from under tongue     BREAST BIOPSY Left    benign   BREAST SURGERY Left 2004   left partial mastectomy, [facility]   CATARACT EXTRACTION Right 06/04/2015   CATARACT EXTRACTION W/PHACO  05/16/2011   Procedure: CATARACT EXTRACTION PHACO AND INTRAOCULAR LENS PLACEMENT ([place]);  Surgeon: Tonny Branch;  Location: [facility] ORS;  Service: Ophthalmology;  Laterality: Right;   COLONOSCOPY N/A 05/17/2013   Dr. Barnie Alderman diverticulosis was noted/small internal hemorrhoids   COLONOSCOPY WITH PROPOFOL N/A 04/12/2021   Colonoscopy June 2022: small anal sphincter s/p rectal surgery, internal hemorrhoids, one 2 mm polyp in cecum (benign). 10 year surveillance.   CYST REMOVED LEFT BREAST /BENIGN  2005   left, [facility]   ESOPHAGOGASTRODUODENOSCOPY N/A 01/09/2019   normal esophagus. Mild NSAID gastritis s/p biopsy. +H.pylori. treated with amoxicillin, biaxin, Nexium.Marland Kitchen ERADICATION DOCUMENTED   EYE SURGERY  2010, 1992 approx   bilateral   POLYPECTOMY  04/12/2021   Procedure: POLYPECTOMY;  Surgeon: Eloise Harman, DO;  Location: [facility];  Service: Endoscopy;;   REPAIR IMPERFORATE ANUS / ANORECTOPLASTY     per patient   TOTAL ABDOMINAL HYSTERECTOMY W/ BILATERAL SALPINGOOPHORECTOMY  2007   [facility], [place]   TUBAL LIGATION  1991   Family History  Adopted: Yes  Problem Relation Age of Onset   Alcohol abuse Mother    Stomach cancer Mother 19   Lung cancer Father    Glaucoma Father    Alcohol abuse Father    Seizures Father    Breast cancer Sister 30   Breast cancer Sister 24   Heart disease Sister    Heart attack Sister     Glaucoma Son    Glaucoma Maternal Grandfather    Cancer Maternal Grandfather        poss leukemia   SIDS Brother    Hyperthyroidism Daughter    Seizures Son        as a Sport and exercise psychologist   Kidney disease Maternal Grandmother    Heart disease Maternal Grandmother        Psychologist, forensic   Leukemia Maternal Grandmother    Colon cancer Neg Hx    Social History   Socioeconomic History   Marital status: Significant Other    Spouse name: Not on file   Number of children: 3   Years of education: 12   Highest education level: 12th  grade  Occupational History   Occupation: UNEMPLOYED 2011    Comment: disability    Employer: DISABLED  Tobacco Use   Smoking status: Former    Packs/day: 3.00    Years: 2.00    Total pack years: 6.00    Types: Cigarettes    Quit date: 05/12/1991    Years since quitting: 31.3   Smokeless tobacco: Never  Vaping Use   Vaping Use: Never used  Substance and Sexual Activity   Alcohol use: Yes    Comment: occasionally   Drug use: Never   Sexual activity: Yes    Birth control/protection: Surgical  Other Topics Concern   Not on file  Social History Narrative   Lives 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   caffien- coffee, 1 cup daily   Social Determinants of Health   Financial Resource Strain: Low Risk  (09/07/2021)   Overall Financial Resource Strain (CARDIA)    Difficulty of Paying Living Expenses: Not hard at all  Food Insecurity: No Food Insecurity (09/07/2021)   Hunger Vital Sign    Worried About Running Out of Food in the Last Year: Never true    Ran Out of Food in the Last Year: Never true  Transportation Needs: No Transportation Needs (09/07/2021)   PRAPARE - Hydrologist (Medical): No    Lack of Transportation (Non-Medical): No  Physical Activity: Insufficiently Active (09/07/2021)   Exercise Vital Sign    Days of Exercise per Week: 3 days    Minutes of Exercise per Session: 20 min  Stress: No Stress Concern Present (09/07/2021)    [place]    Feeling of Stress : Not at all  Social Connections: Moderately Isolated (09/07/2021)   Social Connection and Isolation Panel [NHANES]    Frequency of Communication with Friends and Family: Three times a week    Frequency of Social Gatherings with Friends and Family: Twice a week    Attends Religious Services: 1 to 4 times per year    Active Member of [employer] or Organizations: No    Attends Music therapist: Never    Marital Status: Divorced    Tobacco Counseling Counseling given: Not Answered   Clinical Intake:  Pre-visit preparation completed: Yes  Pain : 0-10 Pain Score: 5  Pain Type: Chronic pain Pain Location: Leg Pain Orientation: Right Pain Descriptors / Indicators: Sore, Aching Pain Onset: More than a month ago Pain Frequency: Intermittent Pain Relieving Factors: Biofreeze helps and ibuprofen.  Pain Relieving Factors: Biofreeze helps and ibuprofen.  Diabetes: No  How often do you need to have someone help you when you read instructions, pamphlets, or other written materials from your doctor or pharmacy?: 3 - Sometimes (sometimes doesnt understand) What is the last grade level you completed in school?: 12th grade  Diabetic?No         Activities of Daily Living    08/30/2022    3:08 PM 09/07/2021   11:02 AM  In your present state of health, do you have any difficulty performing the following activities:  Hearing? 0 0  Vision? 0 0  Difficulty concentrating or making decisions? 1 0  Comment [place] help with descisions.   Walking or climbing stairs? 0 0  Dressing or bathing? 0 0  Doing errands, shopping? 0 0  Preparing Food and eating ?  N  Using the Toilet?  N  In the past six months, have you accidently  leaked urine?  N  Do you have problems with loss of bowel control?  N  Managing your Medications?  N  Managing your Finances?  N  Housekeeping or managing  your Housekeeping?  N    Patient Care Team: [name], MD as PCP - [facility], Marga Melnick, MD (Inactive) as Attending Physician (Gastroenterology) Florian Buff, MD as Consulting Physician (Obstetrics and Gynecology)  Indicate any recent Medical Services you may have received from other than [facility] providers in the past year (date may be approximate).     Assessment:   This is a routine wellness examination for [place].  Hearing/Vision screen No results found.  Dietary issues and exercise activities discussed:     Goals Addressed   None    Depression Screen    08/30/2022    3:08 PM 07/08/2022    1:13 PM 04/27/2022    3:35 PM 01/25/2022    1:02 PM 12/10/2021    1:29 PM 09/07/2021    1:01 PM 09/07/2021   11:01 AM  PHQ 2/9 Scores  PHQ - 2 Score 0 1 0 1 0 0 0    Fall Risk    08/30/2022    3:05 PM 07/08/2022    1:13 PM 04/27/2022    3:35 PM 01/25/2022    1:02 PM 12/10/2021    1:29 PM  [place] in the past year? 0 0 0 0 0  Number falls in past yr: 0 0 0 0 0  Injury with Fall? 0 0 0 0 0  Risk for fall due to :  No Fall Risks No Fall Risks No Fall Risks No Fall Risks  Follow up  Falls evaluation completed Falls evaluation completed Falls evaluation completed Falls evaluation completed    [place]:  Any stairs in or around the home? No  If so, are there any without handrails? No  Home free of loose throw rugs in walkways, pet beds, electrical cords, etc? Yes  Adequate lighting in your home to reduce risk of falls? Yes   ASSISTIVE DEVICES UTILIZED TO PREVENT FALLS:  Life alert? No  Use of a cane, walker or w/c? No  Grab bars in the bathroom? No  Shower chair or bench in shower? No  Elevated toilet seat or a handicapped toilet? No     Cognitive Function:    09/07/2021   11:02 AM 10/08/2018    8:16 AM 08/28/2018    8:24 AM  MMSE - Mini Mental State Exam  Not completed: Unable to complete    Orientation to time  5  4  Orientation to Place  4 5  Registration  3 3  Attention/ Calculation  0 0  Recall  2 3  Language- name 2 objects  2 2  Language- repeat  0 1  Language- follow 3 step command  3 3  Language- read & follow direction  1 1  Write a sentence  1 0  Copy design  0 0  Total score  21 22        09/07/2021   11:02 AM 09/02/2020    3:39 PM 08/30/2019    9:46 AM 08/27/2018   10:20 AM 08/02/2017   11:43 AM  6CIT Screen  What Year? 0 points 0 points 0 points 0 points 0 points  What month? 0 points 0 points 0 points 0 points 0 points  What time? 0 points 0 points 0 points 0  points 0 points  Count back from 20 0 points 0 points 0 points 2 points 0 points  Months in reverse 4 points 4 points 0 points 4 points 0 points  Repeat phrase 0 points 2 points 4 points 4 points 0 points  Total Score 4 points 6 points 4 points 10 points 0 points    Immunizations Immunization History  Administered Date(s) Administered   Influenza Split 07/27/2011, 07/26/2012, 08/16/2016   Influenza Whole 01/08/2008, 08/07/2008   Influenza,inj,Quad PF,6+ Mos 08/21/2013, 08/11/2014, 07/28/2015, 07/27/2018, 08/07/2019, 08/17/2020, 07/06/2021, 07/08/2022   Moderna SARS-COV2 Booster Vaccination 02/22/2021   Moderna Sars-Covid-2 Vaccination 01/31/2020, 03/06/2020   Td 05/25/2004   Tdap 02/24/2015   Zoster Recombinat (Shingrix) 02/20/2018, 07/19/2021    TDAP status: Up to date  Flu Vaccine status: Up to date  Pneumococcal vaccine status: Up to date  Covid-19 vaccine status: Information provided on how to obtain vaccines.   Qualifies for Shingles Vaccine? Yes   Zostavax completed No   Shingrix Completed?: Yes  Screening Tests Health Maintenance  Topic Date Due   COVID-19 Vaccine (3 - Moderna series) 04/19/2021   Medicare Annual Wellness (AWV)  10/07/2022   MAMMOGRAM  07/28/2024   TETANUS/TDAP  02/23/2025   PAP SMEAR-Modifier  07/08/2025   COLONOSCOPY (Pts 45-32yr Insurance coverage will need to be  confirmed)  04/13/2031   INFLUENZA VACCINE  Completed   Hepatitis C Screening  Completed   HIV Screening  Completed   Zoster Vaccines- Shingrix  Completed   HPV VACCINES  Aged Out    Health Maintenance  Health Maintenance Due  Topic Date Due   COVID-19 Vaccine (3 - Moderna series) 04/19/2021   Medicare Annual Wellness (AWV)  10/07/2022    Colorectal cancer screening: Type of screening: Colonoscopy. Completed 04/12/2021. Repeat every 10 years  Mammogram status: Completed 07/28/2022. Repeat every year  Lung Cancer Screening: (Low Dose CT Chest recommended if Age 59-80years, 30 pack-year currently smoking OR have quit w/in 15years.) does not qualify.   Additional Screening:  Hepatitis C Screening: does not qualify; Completed 10/08/2015  Vision Screening: Recommended annual ophthalmology exams for early detection of glaucoma and other disorders of the eye. Is the patient up to date with their annual eye exam?  Yes  Who is the provider or what is the name of the office in which the patient attends annual eye exams? [facility] If pt is not established with a provider, would they like to be referred to a provider to establish care? No .   Dental Screening: Recommended annual dental exams for proper oral hygiene  Community Resource Referral / Chronic Care Management: CRR required this visit?  No   CCM required this visit?  No      Plan:     I have personally reviewed and noted the following in the patient's chart:   Medical and social history Use of alcohol, tobacco or illicit drugs  Current medications and supplements including opioid prescriptions. Patient is not currently taking opioid prescriptions. Functional ability and status Nutritional status Physical activity Advanced directives List of other physicians Hospitalizations, surgeries, and ER visits in previous 12 months Vitals Screenings to include cognitive, depression, and falls Referrals and appointments  In  addition, I have reviewed and discussed with patient certain preventive protocols, quality metrics, and best practice recommendations. A written personalized care plan for preventive services as well as general preventive health recommendations were provided to patient.     JLorene Dy MD   08/30/2022
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
## 2022-09-01 ENCOUNTER — Other Ambulatory Visit: Payer: Self-pay | Admitting: Family Medicine

## 2022-09-01 DIAGNOSIS — L309 Dermatitis, unspecified: Secondary | ICD-10-CM

## 2022-09-02 ENCOUNTER — Encounter: Payer: Self-pay | Admitting: Psychiatry

## 2022-09-02 ENCOUNTER — Telehealth (INDEPENDENT_AMBULATORY_CARE_PROVIDER_SITE_OTHER): Payer: 59 | Admitting: Psychiatry

## 2022-09-02 DIAGNOSIS — F3341 Major depressive disorder, recurrent, in partial remission: Secondary | ICD-10-CM

## 2022-09-02 MED ORDER — VENLAFAXINE HCL ER 75 MG PO CP24: 75.0000 mg | ORAL_CAPSULE | Freq: Every day | ORAL | 0 refills | Status: DC

## 2022-09-02 NOTE — Patient Instructions (Signed)
Increase venlafaxine 75 mg daily   2. Next appointment: 1/24 at 1:20

## 2022-09-15 ENCOUNTER — Other Ambulatory Visit: Payer: Self-pay | Admitting: Gastroenterology

## 2022-09-19 NOTE — Telephone Encounter (Signed)
Pt called and stated she has not had her Esomeprazole since June. I advised her what was in the system regarding this. So I called to her mail order service and I was advised by Linna Hoff that she opted out of their services July 7th. I called pt back and she does not remember nor does her son.  Please send this pt's refills for this medication to [facility] today. States she is having acid reflux bad. Please advise

## 2022-10-03 ENCOUNTER — Other Ambulatory Visit: Payer: Self-pay | Admitting: Family Medicine

## 2022-10-26 ENCOUNTER — Other Ambulatory Visit: Payer: Self-pay | Admitting: Family Medicine

## 2022-11-10 ENCOUNTER — Other Ambulatory Visit: Payer: Self-pay | Admitting: Family Medicine
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
## 2022-11-30 ENCOUNTER — Encounter: Payer: Self-pay | Admitting: Psychiatry

## 2022-11-30 ENCOUNTER — Telehealth (INDEPENDENT_AMBULATORY_CARE_PROVIDER_SITE_OTHER): Payer: 59 | Admitting: Psychiatry

## 2022-11-30 DIAGNOSIS — F3341 Major depressive disorder, recurrent, in partial remission: Secondary | ICD-10-CM | POA: Diagnosis not present

## 2022-11-30 MED ORDER — VENLAFAXINE HCL ER 75 MG PO CP24: 75.0000 mg | ORAL_CAPSULE | Freq: Every day | ORAL | 0 refills | Status: DC

## 2022-11-30 NOTE — Patient Instructions (Signed)
Continue venlafaxine 75 mg daily  2. Next appointment: 4/24 at 8:20

## 2022-12-08 ENCOUNTER — Ambulatory Visit (INDEPENDENT_AMBULATORY_CARE_PROVIDER_SITE_OTHER): Payer: 59 | Admitting: Family Medicine

## 2022-12-08 ENCOUNTER — Encounter: Payer: Self-pay | Admitting: Family Medicine

## 2022-12-08 VITALS — BP 133/83 | HR 103 | Ht 61.0 in | Wt 123.0 lb

## 2022-12-08 DIAGNOSIS — I10 Essential (primary) hypertension: Secondary | ICD-10-CM | POA: Diagnosis not present

## 2022-12-08 DIAGNOSIS — F3341 Major depressive disorder, recurrent, in partial remission: Secondary | ICD-10-CM

## 2022-12-08 DIAGNOSIS — R55 Syncope and collapse: Secondary | ICD-10-CM | POA: Diagnosis not present

## 2022-12-08 DIAGNOSIS — Z13220 Encounter for screening for lipoid disorders: Secondary | ICD-10-CM

## 2022-12-08 DIAGNOSIS — R5383 Other fatigue: Secondary | ICD-10-CM

## 2022-12-08 DIAGNOSIS — E785 Hyperlipidemia, unspecified: Secondary | ICD-10-CM

## 2022-12-08 NOTE — Patient Instructions (Addendum)
F/u in 4 months, call if you need me  sooner  EKG in office today because of report of recurrent episodes of near passing out in past 4 months, twice weekly.   Fasting Lipid, cmp and EGFR  in next 1 to 2 weeks, have nothing but water before your blood work  You are being referred for [place] of neck arteries to see if there is blockage AND  also to see cardiology  Thanks for choosing [facility], we consider it a privelige to serve you.

## 2022-12-09 DIAGNOSIS — R55 Syncope and collapse: Secondary | ICD-10-CM | POA: Insufficient documentation

## 2022-12-09 NOTE — Assessment & Plan Note (Signed)
Will address at next visit, no med change initiated

## 2022-12-09 NOTE — Assessment & Plan Note (Signed)
Hyperlipidemia:Low fat diet discussed and encouraged.   Lipid Panel  Lab Results  Component Value Date   CHOL 175 07/13/2022   HDL 68 07/13/2022   LDLCALC 92 07/13/2022   TRIG 82 07/13/2022   CHOLHDL 2.6 07/13/2022     Updated lab needed at/ before next visit. Controlled when last checked

## 2022-12-09 NOTE — Assessment & Plan Note (Signed)
Recurrent episodes x 4 months, in office EKG , carotid doppler and cardiology eval

## 2022-12-09 NOTE — Assessment & Plan Note (Signed)
Controlled, no change in medication DASH diet and commitment to daily physical activity for a minimum of 30 minutes discussed and encouraged, as a part of hypertension management. The importance of attaining a healthy weight is also discussed.     12/08/2022   10:54 AM 07/08/2022    1:54 PM 07/08/2022    1:12 PM 05/17/2022    2:27 PM 04/27/2022    3:34 PM 02/10/2022   11:30 AM 02/06/2022    1:48 PM  BP/Weight  Systolic BP 919 802 217 981 025 486 282  Diastolic BP 83 84 89 79 98 62 81  Wt. (Lbs) 123.04  127.12 129 127.04 127.2   BMI 23.25 kg/m2  24.02 kg/m2 24.37 kg/m2 23.24 kg/m2 24.03 kg/m2

## 2022-12-09 NOTE — Progress Notes (Signed)
   Toni Miller     MRN: [id number]      DOB: 1961-03-06   HPI Toni Miller is here with main c/o  episodes of near syncope, on average twice weekly over the past 4 months, most recent episode was just this past week, duration less than 3 mins, no chest pain noted, no nausea or diaphoresis, unaware of palpitations   ROS Denies recent fever or chills. Denies sinus pressure, nasal congestion, ear pain or sore throat. Denies chest congestion, productive cough or wheezing. Denies chest pains, palpitations, PND , orthopnea  and leg swelling Denies abdominal pain, nausea, vomiting,diarrhea or constipation.   Denies dysuria, frequency, hesitancy or incontinence. Denies uncontrolled  joint pain, swelling and limitation in mobility. Denies headaches, seizures, numbness, or tingling. C/o depression, denies anxiety or insomnia. Denies skin break down or rash.   PE  BP 133/83 (BP Location: Right Arm, Patient Position: Sitting, Cuff Size: Normal)   Pulse (!) 103   Ht '5\' 1"'$  (1.549 m)   Wt 123 lb 0.6 oz (55.8 kg)   SpO2 93%   BMI 23.25 kg/m   Patient alert and oriented and in no cardiopulmonary distress.  HEENT: No facial asymmetry, EOMI,     Neck supple .  Chest: Clear to auscultation bilaterally.  CVS: S1, S2 no murmurs, no S3.Regular rate. EKG: no acute ischemia or LVH ABD: Soft non tender.   Ext: No edema  MS: Adequate ROM spine, shoulders, hips and knees.  Skin: Intact, no ulcerations or rash noted.  Psych: Good eye contact, normal affect. Memory intact not anxious or depressed appearing.  CNS: CN 2-12 intact, power,  normal throughout.no focal deficits noted.   Assessment & Plan  Hypertension Controlled, no change in medication DASH diet and commitment to daily physical activity for a minimum of 30 minutes discussed and encouraged, as a part of hypertension management. The importance of attaining a healthy weight is also discussed.     12/08/2022   10:54 AM 07/08/2022     1:54 PM 07/08/2022    1:12 PM 05/17/2022    2:27 PM 04/27/2022    3:34 PM 02/10/2022   11:30 AM 02/06/2022    1:48 PM  BP/Weight  Systolic BP 537 482 707 867 544 920 100  Diastolic BP 83 84 89 79 98 62 81  Wt. (Lbs) 123.04  127.12 129 127.04 127.2   BMI 23.25 kg/m2  24.02 kg/m2 24.37 kg/m2 23.24 kg/m2 24.03 kg/m2        Hyperlipidemia with target LDL less than 100 Hyperlipidemia:Low fat diet discussed and encouraged.   Lipid Panel  Lab Results  Component Value Date   CHOL 175 07/13/2022   HDL 68 07/13/2022   LDLCALC 92 07/13/2022   TRIG 82 07/13/2022   CHOLHDL 2.6 07/13/2022     Updated lab needed at/ before next visit. Controlled when last checked  Depression, major, recurrent, in partial remission ([place]) Will address at next visit, no med change initiated  Near syncope Recurrent episodes x 4 months, in office EKG , carotid doppler and cardiology eval

## 2022-12-22 ENCOUNTER — Telehealth: Payer: Self-pay | Admitting: Internal Medicine

## 2022-12-22 ENCOUNTER — Ambulatory Visit (INDEPENDENT_AMBULATORY_CARE_PROVIDER_SITE_OTHER): Payer: 59

## 2022-12-22 ENCOUNTER — Other Ambulatory Visit: Payer: Self-pay | Admitting: Internal Medicine

## 2022-12-22 ENCOUNTER — Ambulatory Visit: Payer: 59 | Attending: Internal Medicine | Admitting: Internal Medicine

## 2022-12-22 ENCOUNTER — Encounter: Payer: Self-pay | Admitting: Internal Medicine

## 2022-12-22 VITALS — BP 140/96 | HR 93 | Ht 61.0 in | Wt 122.2 lb

## 2022-12-22 DIAGNOSIS — R002 Palpitations: Secondary | ICD-10-CM

## 2022-12-22 DIAGNOSIS — R42 Dizziness and giddiness: Secondary | ICD-10-CM | POA: Diagnosis not present

## 2022-12-22 NOTE — Telephone Encounter (Signed)
14 Day ZIO XT  PERCERT

## 2022-12-22 NOTE — Patient Instructions (Addendum)
Medication Instructions:  Your physician recommends that you continue on your current medications as directed. Please refer to the Current Medication list given to you today.  Labwork: none  Testing/Procedures: Your physician has requested that you have an echocardiogram. Echocardiography is a painless test that uses sound waves to create images of your heart. It provides your doctor with information about the size and shape of your heart and how well your heart's chambers and valves are working. This procedure takes approximately one hour. There are no restrictions for this procedure. Please do NOT wear cologne, perfume, aftershave, or lotions (deodorant is allowed). Please arrive 15 minutes prior to your appointment time. Your physician has recommended that you wear a Zio monitor.   This monitor is a medical device that records the heart's electrical activity. Doctors most often use these monitors to diagnose arrhythmias. Arrhythmias are problems with the speed or rhythm of the heartbeat. The monitor is a small device applied to your chest. You can wear one while you do your normal daily activities. While wearing this monitor if you have any symptoms to push the button and record what you felt. Once you have worn this monitor for the period of time provider prescribed (for 14 days), you will return the monitor device in the postage paid box. Once it is returned they will download the data collected and provide [place] with a report which the provider will then review and we will call you with those results. Important tips:  Avoid showering during the first 24 hours of wearing the monitor. Avoid excessive sweating to help maximize wear time. Do not submerge the device, no hot tubs, and no swimming pools. Keep any lotions or oils away from the patch. After 24 hours you may shower with the patch on. Take brief showers with your back facing the shower head.  Do not remove patch once it has been placed  because that will interrupt data and decrease adhesive wear time. Push the button when you have any symptoms and write down what you were feeling. Once you have completed wearing your monitor, remove and place into box which has postage paid and place in your outgoing mailbox.  If for some reason you have misplaced your box then call our office and we can provide another box and/or mail it off for you.  Follow-Up: Your physician recommends that you schedule a follow-up appointment in: 3 months  Any Other Special Instructions Will Be Listed Below (If Applicable).  If you need a refill on your cardiac medications before your next appointment, please call your pharmacy.

## 2022-12-22 NOTE — Progress Notes (Signed)
Cardiology Office Note  Date: 12/22/2022   ID: Toni Miller, DOB [birth year], MRN [id number]  PCP:  [name], MD  Cardiologist:  None Electrophysiologist:  None   Reason for Office Visit: Syncopal evaluation at the request of Dr. Moshe Cipro   History of Present Illness: Toni Miller is a 62 y.o. female known to have HTN, HLD was referred to cardiology clinic for evaluation of presyncope.  Patient has severe hot flashes due to which she is currently on medications. Follows up with OB/GYN. Patient had syncopal episodes a few times per month where her body becomes hot/warm, feels dizzy followed by syncopal episode. She loses consciousness for about 15 to 20 minutes.  These have been going on for quite some time. Denies any other symptoms like chest pain, palpitations, SOB, claudication.  Past Medical History:  Diagnosis Date   Anxiety    Arthritis    Cancer ([place]) 1993   abnormal pap treated at [facility]   Constipation    Depression    GERD (gastroesophageal reflux disease)    Glaucoma 1993   History of kidney stones    Hot flashes, menopausal 06/12/2011   Hyperlipidemia    Hypertension    Neck pain 7/09   WITH BULGING DISC-----RECIEVING EPIDURALS    Sinusitis     Past Surgical History:  Procedure Laterality Date   ABDOMINAL EXPLORATION SURGERY  age 57   bowel obstruction, [facility]   ABDOMINAL HYSTERECTOMY  2007   [facility], [facility]   BILATERAL EYE SURGERY     for glaucoma   BIOPSY  01/09/2019   Procedure: BIOPSY;  Surgeon: Danie Binder, MD;  Location: [facility];  Service: Endoscopy;;  gstric    bone removed from under tongue     BREAST BIOPSY Left    benign   BREAST SURGERY Left 2004   left partial mastectomy, [facility]   CATARACT EXTRACTION Right 06/04/2015   CATARACT EXTRACTION W/PHACO  05/16/2011   Procedure: CATARACT EXTRACTION PHACO AND INTRAOCULAR LENS PLACEMENT ([place]);  Surgeon: Tonny Branch;  Location: [facility];  Service: Ophthalmology;  Laterality: Right;    COLONOSCOPY N/A 05/17/2013   Dr. Barnie Alderman diverticulosis was noted/small internal hemorrhoids   COLONOSCOPY WITH PROPOFOL N/A 04/12/2021   Colonoscopy June 2022: small anal sphincter s/p rectal surgery, internal hemorrhoids, one 2 mm polyp in cecum (benign). 10 year surveillance.   CYST REMOVED LEFT BREAST /BENIGN  2005   left, [facility]   ESOPHAGOGASTRODUODENOSCOPY N/A 01/09/2019   normal esophagus. Mild NSAID gastritis s/p biopsy. +H.pylori. treated with amoxicillin, biaxin, Nexium.Marland Kitchen ERADICATION DOCUMENTED   EYE SURGERY  2010, 1992 approx   bilateral   POLYPECTOMY  04/12/2021   Procedure: POLYPECTOMY;  Surgeon: Eloise Harman, DO;  Location: [facility];  Service: Endoscopy;;   REPAIR IMPERFORATE ANUS / ANORECTOPLASTY     per patient   TOTAL ABDOMINAL HYSTERECTOMY W/ BILATERAL SALPINGOOPHORECTOMY  2007   [facility], [place]   TUBAL LIGATION  1991    Current Outpatient Medications  Medication Sig Dispense Refill   calcium-vitamin D (OSCAL-500) 500-400 MG-UNIT tablet Take 1 tablet by mouth 2 (two) times daily. 60 tablet 5   cholecalciferol (VITAMIN D3) 25 MCG (1000 UNIT) tablet Take 1,000 Units by mouth daily.     conjugated estrogens (PREMARIN) vaginal cream Apply fingertip amount 3 time s weekly as needed, for vaginal dryness 42.5 g 1   cycloSPORINE (RESTASIS) 0.05 % ophthalmic emulsion Place 1 drop into both eyes 2 (two) times daily.  esomeprazole (NEXIUM) 40 MG capsule TAKE ONE CAPSULE BY MOUTH TWICE DAILY. 60 capsule 5   fluticasone (FLONASE) 50 MCG/ACT nasal spray Place 1 spray into both nostrils daily as needed for allergies or rhinitis. 16 mL 2   gabapentin (NEURONTIN) 300 MG capsule TAKE 1 CAPSULE BY MOUTH TWICE DAILY. 60 capsule 0   hydrocortisone (ANUSOL-HC) 2.5 % rectal cream APPLY (1) APPLICATION TWICE DAILY ( FOR RECTAL ITCHING AND BURNING) 30 g 0   hydrOXYzine (ATARAX) 10 MG tablet TAKE ONE TABLET BY MOUTH ONCE DAILY AS NEEDED FOR ITCHING. 30 tablet 0   linaclotide (LINZESS)  145 MCG CAPS capsule Take 145 mcg by mouth every other day.     montelukast (SINGULAIR) 10 MG tablet TAKE ONE TABLET BY MOUTH DAILY 90 tablet 0   Multiple Vitamin (MULTIVITAMIN) capsule Take 1 capsule by mouth daily.     rosuvastatin (CRESTOR) 10 MG tablet TAKE (1) TABLET BY MOUTH AT BEDTIME. 90 tablet 0   TRAVATAN Z 0.004 % SOLN ophthalmic solution Place 1 drop into both eyes at bedtime.     triamterene-hydrochlorothiazide (MAXZIDE-25) 37.5-25 MG tablet TAKE (1 & 1/2) TABLETS BY MOUTH DAILY FOR BLOOD PRESSURE. 135 tablet 0   venlafaxine XR (EFFEXOR-XR) 75 MG 24 hr capsule Take 1 capsule (75 mg total) by mouth daily. 90 capsule 0   No current facility-administered medications for this visit.   Allergies:  Acyclovir and related   Social History: The patient  reports that she quit smoking about 31 years ago. Her smoking use included cigarettes. She has a 6.00 pack-year smoking history. She has never used smokeless tobacco. She reports current alcohol use. She reports that she does not use drugs.   Family History: The patient's family history includes Alcohol abuse in her father and mother; Breast cancer (age of onset: 59) in her sister; Breast cancer (age of onset: 40) in her sister; Cancer in her maternal grandfather; Glaucoma in her father, maternal grandfather, and son; Heart attack in her sister; Heart disease in her maternal grandmother and sister; Hyperthyroidism in her daughter; Kidney disease in her maternal grandmother; Leukemia in her maternal grandmother; Lung cancer in her father; SIDS in her brother; Seizures in her father and son; Stomach cancer (age of onset: 80) in her mother. She was adopted.   ROS:  Please see the history of present illness. Otherwise, complete review of systems is positive for none.  All other systems are reviewed and negative.   Physical Exam: VS:  BP (!) 140/96   Pulse 93   Ht 5' 1"$  (1.549 m)   Wt 122 lb 3.2 oz (55.4 kg)   SpO2 98%   BMI 23.09 kg/m , BMI  Body mass index is 23.09 kg/m.  Wt Readings from Last 3 Encounters:  12/22/22 122 lb 3.2 oz (55.4 kg)  12/08/22 123 lb 0.6 oz (55.8 kg)  07/08/22 127 lb 1.9 oz (57.7 kg)    General: Patient appears comfortable at rest. HEENT: Conjunctiva and lids normal, oropharynx clear with moist mucosa. Neck: Supple, no elevated JVP or carotid bruits, no thyromegaly. Lungs: Clear to auscultation, nonlabored breathing at rest. Cardiac: Regular rate and rhythm, no S3 or significant systolic murmur, no pericardial rub. Abdomen: Soft, nontender, no hepatomegaly, bowel sounds present, no guarding or rebound. Extremities: No pitting edema, distal pulses 2+. Skin: Warm and dry. Musculoskeletal: No kyphosis. Neuropsychiatric: Alert and oriented x3, affect grossly appropriate.  ECG:  An ECG dated 12/08/2022 was personally reviewed today and demonstrated:  Normal sinus rhythm  Recent Labwork: 07/13/2022: ALT 19; AST 24; BUN 13; Creatinine, Ser 0.97; Hemoglobin 14.1; Platelets 271; Potassium 3.9; Sodium 140; TSH 1.270     Component Value Date/Time   CHOL 175 07/13/2022 0946   TRIG 82 07/13/2022 0946   HDL 68 07/13/2022 0946   CHOLHDL 2.6 07/13/2022 0946   CHOLHDL 2.5 08/10/2020 0000   VLDL 19 07/05/2017 1123   LDLCALC 92 07/13/2022 0946   LDLCALC 78 08/10/2020 0000    Other Studies Reviewed Today:   Assessment and Plan: Patient is a 62 year old F known to have HTN, HLD was referred to cardiology clinic for evaluation of syncope.  # Syncope -Obtain 2D echocardiogram to rule out structural heart disease and 2-week event monitor to rule out any conduction system abnormalities.  If normal, syncope could likely be secondary to severe hot flashes.  # HTN, controlled -Continue triamterene-HCTZ 37.5-25 mg once daily  # HLD -Continue rosuvastatin 10 mg nightly  I have spent a total of 45 minutes with patient reviewing chart, EKGs, labs and examining patient as well as establishing an assessment and plan  that was discussed with the patient.  > 50% of time was spent in direct patient care.      Medication Adjustments/Labs and Tests Ordered: Current medicines are reviewed at length with the patient today.  Concerns regarding medicines are outlined above.   Tests Ordered: No orders of the defined types were placed in this encounter.   Medication Changes: No orders of the defined types were placed in this encounter.   Disposition:  Follow up  3 months  Signed Kiaya Haliburton Fidel Levy, MD, 12/22/2022 12:56 PM    [facility], [place], [place] [address]

## 2022-12-23 ENCOUNTER — Telehealth (INDEPENDENT_AMBULATORY_CARE_PROVIDER_SITE_OTHER): Payer: 59 | Admitting: Family Medicine

## 2022-12-23 DIAGNOSIS — U071 COVID-19: Secondary | ICD-10-CM | POA: Diagnosis not present

## 2022-12-23 MED ORDER — NIRMATRELVIR/RITONAVIR (PAXLOVID)TABLET: 3.0000 | ORAL_TABLET | Freq: Two times a day (BID) | ORAL | 0 refills | Status: AC

## 2022-12-23 NOTE — Progress Notes (Unsigned)
Virtual Visit via Video Note  I connected with Toni Miller on 12/23/22 at  4:00 PM EST by a video enabled telemedicine application and verified that I am speaking with the correct person using two identifiers.  Location: Patient: home Provider: office   I discussed the limitations of evaluation and management by telemedicine and the availability of in person appointments. The patient expressed understanding and agreed to proceed.  History of Present Illness: 3 day h/o cough and body aches, positive home covid test noted , has ahd covid in the past needing hospitalization Denies current SOB   Observations/Objective:  There were no vitals taken for this visit. Good communication with no confusion and intact memory. Alert and oriented x 3, ill appearing No signs of respiratory distress during speech  Assessment and Plan: Positive self-administered antigen test for COVID-19 Paxlovid prescribed and advised rest, adequate hydration and to go to Ed if symptoms worsen   Follow Up Instructions:    I discussed the assessment and treatment plan with the patient. The patient was provided an opportunity to ask questions and all were answered. The patient agreed with the plan and demonstrated an understanding of the instructions.   The patient was advised to call back or seek an in-person evaluation if the symptoms worsen or if the condition fails to improve as anticipated.  I provided 12 minutes of non-face-to-face time during this encounter.   Tula Nakayama, MD

## 2022-12-23 NOTE — Patient Instructions (Signed)
F/U as before, call if you need me sooner   Medication is sent to your pharmacy for Covid infection, it is important that you take the medication as prescribed Thanks for choosing [facility], we consider it a privelige to serve you.

## 2022-12-26 ENCOUNTER — Encounter: Payer: Self-pay | Admitting: Family Medicine

## 2022-12-26 NOTE — Assessment & Plan Note (Signed)
Paxlovid prescribed and advised rest, adequate hydration and to go to Ed if symptoms worsen

## 2022-12-28 ENCOUNTER — Other Ambulatory Visit: Payer: Self-pay | Admitting: Internal Medicine

## 2022-12-28 DIAGNOSIS — R55 Syncope and collapse: Secondary | ICD-10-CM

## 2022-12-28 DIAGNOSIS — R42 Dizziness and giddiness: Secondary | ICD-10-CM

## 2022-12-29 ENCOUNTER — Other Ambulatory Visit: Payer: Self-pay | Admitting: Family Medicine

## 2023-01-04 ENCOUNTER — Telehealth: Payer: Self-pay | Admitting: Family Medicine

## 2023-01-04 NOTE — Telephone Encounter (Signed)
error 

## 2023-01-05 ENCOUNTER — Encounter: Payer: Self-pay | Admitting: Radiology

## 2023-01-11 ENCOUNTER — Ambulatory Visit: Payer: 59 | Attending: Internal Medicine

## 2023-01-11 DIAGNOSIS — R42 Dizziness and giddiness: Secondary | ICD-10-CM | POA: Diagnosis not present

## 2023-01-11 DIAGNOSIS — R55 Syncope and collapse: Secondary | ICD-10-CM

## 2023-01-12 LAB — ECHOCARDIOGRAM COMPLETE
AR max vel: 1.65 cm2
AV Peak grad: 5.8 mmHg
Ao pk vel: 1.21 m/s
Area-P 1/2: 4.86 cm2
Calc EF: 66.1 %
MV M vel: 3.31 m/s
MV Peak grad: 43.7 mmHg
S' Lateral: 2 cm
Single Plane A2C EF: 66.5 %
Single Plane A4C EF: 65.8 %

## 2023-01-24 ENCOUNTER — Other Ambulatory Visit: Payer: Self-pay | Admitting: Family Medicine

## 2023-02-22 ENCOUNTER — Other Ambulatory Visit: Payer: Self-pay | Admitting: Family Medicine
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
## 2023-03-01 ENCOUNTER — Encounter: Payer: Self-pay | Admitting: Psychiatry

## 2023-03-01 ENCOUNTER — Telehealth (INDEPENDENT_AMBULATORY_CARE_PROVIDER_SITE_OTHER): Payer: 59 | Admitting: Psychiatry

## 2023-03-01 DIAGNOSIS — F3341 Major depressive disorder, recurrent, in partial remission: Secondary | ICD-10-CM

## 2023-03-01 MED ORDER — VENLAFAXINE HCL ER 75 MG PO CP24: 75.0000 mg | ORAL_CAPSULE | Freq: Every day | ORAL | 1 refills | Status: DC

## 2023-03-14 ENCOUNTER — Telehealth: Payer: Self-pay

## 2023-03-14 MED ORDER — ESOMEPRAZOLE MAGNESIUM 40 MG PO CPDR: DELAYED_RELEASE_CAPSULE | ORAL | 5 refills | Status: DC

## 2023-03-14 NOTE — Telephone Encounter (Signed)
Pt wants her Rx for Esomeprazole Mag Dr Gerlean Ren  sent to [facility]. I put it on your desk so I can fax it back. They need a nre Rx.

## 2023-03-14 NOTE — Telephone Encounter (Signed)
noted 

## 2023-03-14 NOTE — Telephone Encounter (Signed)
I e-prescribed to pharmacy. We don't need to fax that form now.

## 2023-03-14 NOTE — Addendum Note (Signed)
Addended by: Gelene Mink on: 03/14/2023 09:36 AM   Modules accepted: Orders

## 2023-03-15 ENCOUNTER — Other Ambulatory Visit: Payer: Self-pay

## 2023-03-15 MED ORDER — ROSUVASTATIN CALCIUM 10 MG PO TABS: ORAL_TABLET | ORAL | 0 refills | Status: DC

## 2023-03-15 MED ORDER — GABAPENTIN 300 MG PO CAPS: 300.0000 mg | ORAL_CAPSULE | Freq: Two times a day (BID) | ORAL | 0 refills | Status: DC

## 2023-03-15 MED ORDER — MONTELUKAST SODIUM 10 MG PO TABS: 10.0000 mg | ORAL_TABLET | Freq: Every day | ORAL | 0 refills | Status: DC

## 2023-03-15 MED ORDER — FLUTICASONE PROPIONATE 50 MCG/ACT NA SUSP: 1.0000 | Freq: Every day | NASAL | 2 refills | Status: AC | PRN

## 2023-03-15 MED ORDER — TRIAMTERENE-HCTZ 37.5-25 MG PO TABS: ORAL_TABLET | ORAL | 0 refills | Status: DC

## 2023-03-23 ENCOUNTER — Encounter: Payer: Self-pay | Admitting: Nurse Practitioner

## 2023-03-23 ENCOUNTER — Ambulatory Visit: Payer: 59 | Attending: Nurse Practitioner | Admitting: Nurse Practitioner

## 2023-03-23 VITALS — BP 122/88 | HR 80 | Ht 61.0 in | Wt 121.4 lb

## 2023-03-23 DIAGNOSIS — Z87898 Personal history of other specified conditions: Secondary | ICD-10-CM

## 2023-03-23 DIAGNOSIS — E785 Hyperlipidemia, unspecified: Secondary | ICD-10-CM

## 2023-03-23 DIAGNOSIS — I10 Essential (primary) hypertension: Secondary | ICD-10-CM

## 2023-03-23 NOTE — Progress Notes (Signed)
Office Visit    Patient Name: [name] Date of Encounter: 03/23/2023  PCP:  Kerri Perches, MD   [facility]  Cardiologist:  Marjo Bicker, MD  Advanced Practice Provider:  No care team member to display Electrophysiologist:  None   Chief Complaint    Toni Miller is a 62 y.o. female with a hx of hypertension, hyperlipidemia, GERD, history of syncope/dizziness, and history of hot flashes, who presents today for 72-month follow-up  Past Medical History    Past Medical History:  Diagnosis Date   Anxiety    Arthritis    Cancer (HCC) 1993   abnormal pap [facility] at [facility]   Constipation    Depression    GERD (gastroesophageal reflux disease)    Glaucoma 1993   History of kidney stones    Hot flashes, menopausal 06/12/2011   Hyperlipidemia    Hypertension    Neck pain 7/09   WITH BULGING DISC-----RECIEVING EPIDURALS    Sinusitis    Past Surgical History:  Procedure Laterality Date   ABDOMINAL EXPLORATION SURGERY  age 31   bowel obstruction, [facility]   ABDOMINAL HYSTERECTOMY  2007   [facility], [facility]   BILATERAL EYE SURGERY     for glaucoma   BIOPSY  01/09/2019   Procedure: BIOPSY;  Surgeon: West Bali, MD;  Location: [facility];  Service: Endoscopy;;  gstric    bone removed from under tongue     BREAST BIOPSY Left    benign   BREAST SURGERY Left 2004   left partial mastectomy, [facility]   CATARACT EXTRACTION Right 06/04/2015   CATARACT EXTRACTION W/PHACO  05/16/2011   Procedure: CATARACT EXTRACTION PHACO AND INTRAOCULAR LENS PLACEMENT (IOC);  Surgeon: Gemma Payor;  Location: [facility];  Service: Ophthalmology;  Laterality: Right;   COLONOSCOPY N/A 05/17/2013   Dr. Cyndi Bender diverticulosis was noted/small internal hemorrhoids   COLONOSCOPY WITH PROPOFOL N/A 04/12/2021   Colonoscopy June 2022: small anal sphincter s/p rectal surgery, internal hemorrhoids, one 2 mm polyp in cecum (benign). 10 year surveillance.   CYST REMOVED LEFT  BREAST /BENIGN  2005   left, [facility]   ESOPHAGOGASTRODUODENOSCOPY N/A 01/09/2019   normal esophagus. Mild NSAID gastritis s/p biopsy. +H.pylori. treated with amoxicillin, biaxin, Nexium.Marland Kitchen ERADICATION DOCUMENTED   EYE SURGERY  2010, 1992 approx   bilateral   POLYPECTOMY  04/12/2021   Procedure: POLYPECTOMY;  Surgeon: Lanelle Bal, DO;  Location: [facility];  Service: Endoscopy;;   REPAIR IMPERFORATE ANUS / ANORECTOPLASTY     per patient   TOTAL ABDOMINAL HYSTERECTOMY W/ BILATERAL SALPINGOOPHORECTOMY  2007   [facility], [facility]   TUBAL LIGATION  1991    Allergies  Allergies  Allergen Reactions   Acyclovir And Related Rash    History of Present Illness    Toni Miller is a 62 y.o. female with a PMH as mentioned above.  Last seen by Dr. Jenene Slicker on December 22, 2022 for evaluation for syncope at the request of her PCP.  It was noted she was having syncopal episodes noted a few times per month.  Stated before these episodes, body would become hot/warm, would feel dizzy, then would pass out.  Would lose consciousness for about 15 to 20 minutes.  Stated this is going on for quite some time.  Denied any other associated symptoms.  Echocardiogram was arranged as well as 2-week monitor-see results below.  Today she presents for 13-month follow-up.  She states she is doing well.  Denies any syncope/near syncope. Denies any chest pain, shortness of breath, palpitations, syncope, presyncope, dizziness, orthopnea, PND, swelling or significant weight changes, acute bleeding, or claudication.    EKGs/Labs/Other Studies Reviewed:   The following studies were reviewed today:   EKG:  EKG is not ordered today.    Monitor 02/2023:    Normal sinus rhythm ranging from 53 to 167 bpm with an average HR of 77 bpm   Brief SVT lasting 5 beats/2 seconds with an average HR 157 bpm, asymptomatic   No evidence of pauses or AV blocks or ventricular arrhythmias   Less than 1% PAC burden and less than 1% PVC  burden   No patient triggered events   Unremarkable event monitor, no symptoms.  Echo 01/2023:  1. Left ventricular ejection fraction, by estimation, is 70 to 75%. The  left ventricle has hyperdynamic function. The left ventricle has no  regional wall motion abnormalities. Left ventricular diastolic parameters  are consistent with Grade I diastolic  dysfunction (impaired relaxation). The average left ventricular global  longitudinal strain is -25.6 %. The global longitudinal strain is normal.   2. Right ventricular systolic function is normal. The right ventricular  size is normal. Tricuspid regurgitation signal is inadequate for assessing  PA pressure.   3. The mitral valve is normal in structure. Trivial mitral valve  regurgitation. No evidence of mitral stenosis.   4. The aortic valve is tricuspid. Aortic valve regurgitation is not  visualized. No aortic stenosis is present.   5. The inferior vena cava is normal in size with greater than 50%  respiratory variability, suggesting right atrial pressure of 3 mmHg.   Comparison(s): No prior Echocardiogram.  Recent Labs: 07/13/2022: ALT 19; BUN 13; Creatinine, Ser 0.97; Hemoglobin 14.1; Platelets 271; Potassium 3.9; Sodium 140; TSH 1.270  Recent Lipid Panel    Component Value Date/Time   CHOL 175 07/13/2022 0946   TRIG 82 07/13/2022 0946   HDL 68 07/13/2022 0946   CHOLHDL 2.6 07/13/2022 0946   CHOLHDL 2.5 08/10/2020 0000   VLDL 19 07/05/2017 1123   LDLCALC 92 07/13/2022 0946   LDLCALC 78 08/10/2020 0000    Risk Assessment/Calculations:   The 10-year ASCVD risk score (Arnett DK, et al., 2019) is: 4.3%   Values used to calculate the score:     Age: 32 years     Sex: Female     Is Non-Hispanic African American: Yes     Diabetic: No     Tobacco smoker: No     Systolic Blood Pressure: 122 mmHg     Is BP treated: Yes     HDL Cholesterol: 68 mg/dL     Total Cholesterol: 175 mg/dL   Home Medications   Current Meds   Medication Sig   calcium-vitamin D (OSCAL-500) 500-400 MG-UNIT tablet Take 1 tablet by mouth 2 (two) times daily.   cholecalciferol (VITAMIN D3) 25 MCG (1000 UNIT) tablet Take 1,000 Units by mouth daily.   conjugated estrogens (PREMARIN) vaginal cream APPLY A FINGERTIP AMOUNT 3 TIMES WEEKLY VAGINALLY AS NEEDED.   cycloSPORINE (RESTASIS) 0.05 % ophthalmic emulsion Place 1 drop into both eyes 2 (two) times daily.   esomeprazole (NEXIUM) 40 MG capsule TAKE ONE CAPSULE BY MOUTH TWICE DAILY.   fluticasone (FLONASE) 50 MCG/ACT nasal spray Place 1 spray into both nostrils daily as needed for allergies or rhinitis.   gabapentin (NEURONTIN) 300 MG capsule Take 1 capsule (300 mg total) by mouth 2 (two) times daily.   hydrocortisone (ANUSOL-HC)  2.5 % rectal cream APPLY (1) APPLICATION TWICE DAILY ( FOR RECTAL ITCHING AND BURNING)   hydrOXYzine (ATARAX) 10 MG tablet TAKE ONE TABLET BY MOUTH ONCE DAILY AS NEEDED FOR ITCHING.   linaclotide (LINZESS) 145 MCG CAPS capsule Take 145 mcg by mouth every other day.   montelukast (SINGULAIR) 10 MG tablet Take 1 tablet (10 mg total) by mouth daily.   Multiple Vitamin (MULTIVITAMIN) capsule Take 1 capsule by mouth daily.   rosuvastatin (CRESTOR) 10 MG tablet TAKE (1) TABLET BY MOUTH AT BEDTIME.   TRAVATAN Z 0.004 % SOLN ophthalmic solution Place 1 drop into both eyes at bedtime.   triamterene-hydrochlorothiazide (MAXZIDE-25) 37.5-25 MG tablet TAKE (1 & 1/2) TABLETS BY MOUTH DAILY FOR BLOOD PRESSURE.   venlafaxine XR (EFFEXOR-XR) 75 MG 24 hr capsule Take 1 capsule (75 mg total) by mouth daily.     Review of Systems    All other systems reviewed and are otherwise negative except as noted above.  Physical Exam    VS:  BP 122/88   Pulse 80   Ht 5\' 1"  (1.549 m)   Wt 121 lb 6.4 oz (55.1 kg)   SpO2 98%   BMI 22.94 kg/m  , BMI Body mass index is 22.94 kg/m.  Wt Readings from Last 3 Encounters:  03/23/23 121 lb 6.4 oz (55.1 kg)  12/22/22 122 lb 3.2 oz (55.4 kg)   12/08/22 123 lb 0.6 oz (55.8 kg)     GEN: Well nourished, well developed, in no acute distress. HEENT: normal. Neck: Supple, no JVD, carotid bruits, or masses. Cardiac: S1/S2, RRR, no murmurs, rubs, or gallops. No clubbing, cyanosis, edema.  Radials/PT 2+ and equal bilaterally.  Respiratory:  Respirations regular and unlabored, clear to auscultation bilaterally. MS: No deformity or atrophy. Skin: Warm and dry, no rash. Neuro:  Strength and sensation are intact. Psych: Normal affect.  Assessment & Plan    Hx of syncope In setting of hot flashes. Denies any recurrent symptoms. Work-up overall unremarkable - see above. No medication changes. Care precautions and ED precautions discussed. Continue to follow with PCP and OBGYN.  HTN BP stable. Discussed to monitor BP at home at least 2 hours after medications and sitting for 5-10 minutes. Continue Maxzide. Heart healthy diet and regular cardiovascular exercise encouraged.   HLD Upcoming labs arranged by PCP. Continue rosuvastatin. Heart healthy diet and regular cardiovascular exercise encouraged.   Disposition: Follow up in 1 year(s) with Vishnu P Mallipeddi, MD or [facility].  Signed, Sharlene Dory, NP 03/26/2023, 5:22 PM  [facility]

## 2023-03-23 NOTE — Patient Instructions (Addendum)

## 2023-03-28 ENCOUNTER — Other Ambulatory Visit: Payer: Self-pay

## 2023-03-28 MED ORDER — ROSUVASTATIN CALCIUM 10 MG PO TABS: ORAL_TABLET | ORAL | 1 refills | Status: DC

## 2023-03-28 MED ORDER — MONTELUKAST SODIUM 10 MG PO TABS: 10.0000 mg | ORAL_TABLET | Freq: Every day | ORAL | 1 refills | Status: DC

## 2023-03-28 MED ORDER — TRIAMTERENE-HCTZ 37.5-25 MG PO TABS: ORAL_TABLET | ORAL | 0 refills | Status: DC

## 2023-03-28 MED ORDER — GABAPENTIN 300 MG PO CAPS: 300.0000 mg | ORAL_CAPSULE | Freq: Two times a day (BID) | ORAL | 1 refills | Status: DC

## 2023-04-12 ENCOUNTER — Other Ambulatory Visit: Payer: Self-pay | Admitting: Gastroenterology

## 2023-04-12 LAB — CMP14+EGFR
ALT: 31 IU/L (ref 0–32)
AST: 29 IU/L (ref 0–40)
Albumin/Globulin Ratio: 1.6 (ref 1.2–2.2)
Albumin: 4.5 g/dL (ref 3.8–4.9)
Alkaline Phosphatase: 76 IU/L (ref 44–121)
BUN/Creatinine Ratio: 19 (ref 12–28)
BUN: 15 mg/dL (ref 8–27)
Bilirubin Total: 0.3 mg/dL (ref 0.0–1.2)
CO2: 29 mmol/L (ref 20–29)
Calcium: 9.6 mg/dL (ref 8.7–10.3)
Chloride: 96 mmol/L (ref 96–106)
Creatinine, Ser: 0.8 mg/dL (ref 0.57–1.00)
Globulin, Total: 2.9 g/dL (ref 1.5–4.5)
Glucose: 79 mg/dL (ref 70–99)
Potassium: 3.9 mmol/L (ref 3.5–5.2)
Sodium: 139 mmol/L (ref 134–144)
Total Protein: 7.4 g/dL (ref 6.0–8.5)
eGFR: 84 mL/min/{1.73_m2} (ref >59)

## 2023-04-12 LAB — LIPID PANEL
Chol/HDL Ratio: 2.8 ratio (ref 0.0–4.4)
Cholesterol, Total: 189 mg/dL (ref 100–199)
HDL: 68 mg/dL (ref >39)
LDL Chol Calc (NIH): 93 mg/dL (ref 0–99)
Triglycerides: 168 mg/dL — ABNORMAL HIGH (ref 0–149)
VLDL Cholesterol Cal: 28 mg/dL (ref 5–40)

## 2023-04-14 ENCOUNTER — Telehealth: Payer: Self-pay | Admitting: Psychiatry

## 2023-04-14 NOTE — Telephone Encounter (Signed)
Patient has enough to last till week of June 20 th. Will defer to Dr.Hisada.

## 2023-04-14 NOTE — Telephone Encounter (Signed)
Patient has a virtual appointment scheduled on August 21. Patient requested a refill on Venlafaxine-please advise

## 2023-04-16 NOTE — Telephone Encounter (Signed)
She should have refill till Oct. Please advise her to contact the pharmacy.

## 2023-04-17 ENCOUNTER — Other Ambulatory Visit: Payer: Self-pay | Admitting: Family Medicine

## 2023-04-17 NOTE — Telephone Encounter (Signed)
Called patient no answer left voicemail for patient to return call to office. 

## 2023-04-20 ENCOUNTER — Encounter: Payer: Self-pay | Admitting: Internal Medicine

## 2023-04-21 ENCOUNTER — Telehealth: Payer: Self-pay | Admitting: Family Medicine

## 2023-04-21 ENCOUNTER — Ambulatory Visit (INDEPENDENT_AMBULATORY_CARE_PROVIDER_SITE_OTHER): Payer: 59 | Admitting: Family Medicine

## 2023-04-21 ENCOUNTER — Encounter: Payer: Self-pay | Admitting: Family Medicine

## 2023-04-21 VITALS — BP 132/85 | HR 83 | Ht 61.0 in | Wt 123.0 lb

## 2023-04-21 DIAGNOSIS — L03317 Cellulitis of buttock: Secondary | ICD-10-CM

## 2023-04-21 DIAGNOSIS — Z1231 Encounter for screening mammogram for malignant neoplasm of breast: Secondary | ICD-10-CM

## 2023-04-21 DIAGNOSIS — I10 Essential (primary) hypertension: Secondary | ICD-10-CM

## 2023-04-21 DIAGNOSIS — M79676 Pain in unspecified toe(s): Secondary | ICD-10-CM | POA: Insufficient documentation

## 2023-04-21 DIAGNOSIS — E785 Hyperlipidemia, unspecified: Secondary | ICD-10-CM

## 2023-04-21 DIAGNOSIS — N951 Menopausal and female climacteric states: Secondary | ICD-10-CM

## 2023-04-21 DIAGNOSIS — L0231 Cutaneous abscess of buttock: Secondary | ICD-10-CM

## 2023-04-21 DIAGNOSIS — F3341 Major depressive disorder, recurrent, in partial remission: Secondary | ICD-10-CM

## 2023-04-21 DIAGNOSIS — I5043 Acute on chronic combined systolic (congestive) and diastolic (congestive) heart failure: Secondary | ICD-10-CM

## 2023-04-21 MED ORDER — CEPHALEXIN 500 MG PO CAPS: 500.0000 mg | ORAL_CAPSULE | Freq: Two times a day (BID) | ORAL | 0 refills | Status: DC

## 2023-04-21 MED ORDER — VEOZAH 45 MG PO TABS: 45.0000 mg | ORAL_TABLET | Freq: Every day | ORAL | 2 refills | Status: DC

## 2023-04-21 NOTE — Assessment & Plan Note (Signed)
1 mionth inc right great toe pain, refer podiatry

## 2023-04-21 NOTE — Patient Instructions (Signed)
F/U in 10 to 12 weeks, call if you need me sooner  Please schedule mammogram at checkout  Need an appointment with your Psychiatrist re depression, please call and schedule  Keflex prescribed for 5 days for the rash, please do not scratch  New for hot flashes is once daily VEOZAH IF COVERED  You are referred to Podiatry, [facility]    Thanks for choosing [facility], we consider it a privelige to serve you.

## 2023-04-21 NOTE — Telephone Encounter (Signed)
Prescription Request  04/21/2023  LOV: 04/21/2023  What is the name of the medication or equipment?   Fezolinetant (VEOZAH) 45 MG TABS  **Received call from [facility] at [employer] to request a PA on behalf of the patient.**  Have you contacted your pharmacy to request a refill? Yes   Which pharmacy would you like this sent to?  [facility] - [place], [place] - [address] [place] [address] Phone: [phone] Fax: [phone]    Patient notified that their request is being sent to the clinical staff for review and that they should receive a response within 2 business days.   Please advise patient if additional information needed at [phone].

## 2023-04-21 NOTE — Progress Notes (Unsigned)
   Toni Miller     MRN: [id number]      DOB: Feb 27, 1961  Chief Complaint  Patient presents with   Follow-up    Follow up rash on leg     HPI Toni Miller is here for follow up and re-evaluation of chronic medical conditions, medication management and review of any available recent lab and radiology data.  Preventive health is updated, specifically  Cancer screening and Immunization.   Questions or concerns regarding consultations or procedures which the PT has had in the interim are  addressed. The PT denies any adverse reactions to current medications since the last visit.  Rash on right buttock over past 2 days with chills \\Abnormal  sensation on right jaw x 3 days, no new weakness , numbness or change in vision Ingrown toenails has been to [facility] in the past , needs re eval X 1 month Hot flashe esp at night ROS Denies recent fever or chills. Denies sinus pressure, nasal congestion, ear pain or sore throat. Denies chest congestion, productive cough or wheezing. Denies chest pains, palpitations and leg swelling Denies abdominal pain, nausea, vomiting,diarrhea or constipation.   Denies dysuria, frequency, hesitancy or incontinence. Denies joint pain, swelling and limitation in mobility. Denies headaches, seizures, numbness, or tingling. Denies depression, anxiety or insomnia. Denies skin break down or rash.   PE  BP 132/85 (BP Location: Right Arm, Patient Position: Sitting, Cuff Size: Normal)   Pulse 83   Ht 5\' 1"  (1.549 m)   Wt 123 lb 0.6 oz (55.8 kg)   SpO2 97%   BMI 23.25 kg/m   Patient alert and oriented and in no cardiopulmonary distress.  HEENT: No facial asymmetry, EOMI,     Neck supple .  Chest: Clear to auscultation bilaterally.  CVS: S1, S2 no murmurs, no S3.Regular rate.  ABD: Soft non tender.   Ext: No edema  MS: Adequate ROM spine, shoulders, hips and knees.  Skin: Intact, no ulcerations or rash noted.  Psych: Good eye contact, normal affect. Memory  intact not anxious or depressed appearing.  CNS: CN 2-12 intact, power,  normal throughout.no focal deficits noted.   Assessment & Plan  No problem-specific Assessment & Plan notes found for this encounter.

## 2023-04-24 NOTE — Telephone Encounter (Signed)
Pa process started on cover my meds

## 2023-04-26 ENCOUNTER — Telehealth: Payer: Self-pay | Admitting: Family Medicine

## 2023-04-26 DIAGNOSIS — L0231 Cutaneous abscess of buttock: Secondary | ICD-10-CM | POA: Insufficient documentation

## 2023-04-26 DIAGNOSIS — N951 Menopausal and female climacteric states: Secondary | ICD-10-CM | POA: Insufficient documentation

## 2023-04-26 NOTE — Telephone Encounter (Signed)
Spoke with [employer] [facility] and completed clinical questions in regards to PA

## 2023-04-26 NOTE — Telephone Encounter (Signed)
Xylene called from [employer] for prior authorization on medicine Veizah 45 mg [phone] reference [phone] and case will expire on 06.21.2024 before 4 am central time

## 2023-04-26 NOTE — Assessment & Plan Note (Signed)
DASH diet and commitment to daily physical activity for a minimum of 30 minutes discussed and encouraged, as a part of hypertension management. The importance of attaining a healthy weight is also discussed.     04/21/2023   10:35 AM 03/23/2023   11:34 AM 12/22/2022   12:46 PM 12/08/2022   10:54 AM 07/08/2022    1:54 PM 07/08/2022    1:12 PM 05/17/2022    2:27 PM  BP/Weight  Systolic BP 132 122 140 133 120 129 116  Diastolic BP 85 88 96 83 84 89 79  Wt. (Lbs) 123.04 121.4 122.2 123.04  127.12 129  BMI 23.25 kg/m2 22.94 kg/m2 23.09 kg/m2 23.25 kg/m2  24.02 kg/m2 24.37 kg/m2     Controlled, no change in medication

## 2023-04-26 NOTE — Assessment & Plan Note (Signed)
Short course of keflex, infection confined to skin

## 2023-04-26 NOTE — Assessment & Plan Note (Signed)
Hyperlipidemia:Low fat diet discussed and encouraged.   Lipid Panel  Lab Results  Component Value Date   CHOL 189 04/11/2023   HDL 68 04/11/2023   LDLCALC 93 04/11/2023   TRIG 168 (H) 04/11/2023   CHOLHDL 2.8 04/11/2023     Needs to reduce dietar fat , no med change

## 2023-04-26 NOTE — Assessment & Plan Note (Signed)
Inadequately treated, not suicidal or homicidal needs appt with Psych who treats her, she will call

## 2023-04-26 NOTE — Assessment & Plan Note (Signed)
Start veozah daily, re eval  in 2 months

## 2023-04-28 ENCOUNTER — Ambulatory Visit: Payer: 59 | Admitting: Podiatry

## 2023-05-03 ENCOUNTER — Encounter: Payer: Self-pay | Admitting: Podiatry

## 2023-05-03 ENCOUNTER — Ambulatory Visit (INDEPENDENT_AMBULATORY_CARE_PROVIDER_SITE_OTHER): Payer: 59 | Admitting: Podiatry

## 2023-05-03 DIAGNOSIS — L600 Ingrowing nail: Secondary | ICD-10-CM

## 2023-05-03 NOTE — Patient Instructions (Signed)

## 2023-05-03 NOTE — Progress Notes (Signed)
Subjective:   Patient ID: Toni Miller, female   DOB: 62 y.o.   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  I allowed her to read consent form signed consent form today I infiltrated the right big toe 60 mg like Marcaine mixture sterile prep done and using sterile instrumentation remove the hallux nail exposed matrix applied phenol 5 applications 30 seconds followed by alcohol lavage sterile dressing gave instructions on soaks and to wear dressing 24 hours take it off earlier if throbbing were to occur

## 2023-06-08 ENCOUNTER — Encounter: Payer: Self-pay | Admitting: Gastroenterology

## 2023-06-08 ENCOUNTER — Telehealth: Payer: Self-pay | Admitting: Family Medicine

## 2023-06-08 ENCOUNTER — Ambulatory Visit (INDEPENDENT_AMBULATORY_CARE_PROVIDER_SITE_OTHER): Payer: 59 | Admitting: Gastroenterology

## 2023-06-08 VITALS — BP 114/77 | HR 86 | Temp 97.8°F | Ht 61.0 in | Wt 123.8 lb

## 2023-06-08 DIAGNOSIS — R140 Abdominal distension (gaseous): Secondary | ICD-10-CM | POA: Diagnosis not present

## 2023-06-08 DIAGNOSIS — K219 Gastro-esophageal reflux disease without esophagitis: Secondary | ICD-10-CM | POA: Diagnosis not present

## 2023-06-08 DIAGNOSIS — K5909 Other constipation: Secondary | ICD-10-CM | POA: Diagnosis not present

## 2023-06-08 NOTE — Patient Instructions (Signed)
Please have blood work done when you are able.  I am glad your bowel movements are managed with fiber!  You can continue alternating the Nexium and Pepcid as you are doing.  I have included a diet sheet called a Low FODMAP diet. For the next 2-3 weeks, only eat foods from the left side (the low side). After that, you can gradually introduce foods from the right-side column and see what triggers symptoms.  I will see you in 3 months! Please call if no improvement!  I enjoyed seeing you again today! I value our relationship and want to provide genuine, compassionate, and quality care. You may receive a survey regarding your visit with me, and I welcome your feedback! Thanks so much for taking the time to complete this. I look forward to seeing you again.      Gelene Mink, PhD, ANP-BC [facility]

## 2023-06-08 NOTE — Progress Notes (Signed)
Gastroenterology Office Note     Primary Care Physician:  Kerri Perches, MD  Primary Gastroenterologist: Dr. Marletta Lor    Chief Complaint   Chief Complaint  Patient presents with   Follow-up    Stays gassy and bloated     History of Present Illness   Toni WORTHY is a 62 y.o. female presenting today with a history of chronic constipation, GERD, +H.yplori 2020 with documented eradication via breath test in Nov 2021. She had banding of all 3 columns, with last appt April 2023. Here for routine visit now. Last colonoscopy in 2022 and 10 year screening recommended.    Constipated only one time this year. Taking only benefiber daily. BM every other day. No straining. No longer on Linzess. Had one flare with hemorrhoids over past year but now resolved.   Was taking Nexium BID but now alternates with Pepcid. Still will take Nexium prn. No dysphagia.   Stays bloated. Eats broccoli and cauliflower, milk, cheese. Has felt like this whole life. No changes even after BM. No unexplained weight loss. No loss of appetite.  Past Medical History:  Diagnosis Date   Anxiety    Arthritis    Cancer (HCC) 1993   abnormal pap treated at [facility]   Constipation    Depression    GERD (gastroesophageal reflux disease)    Glaucoma 1993   History of kidney stones    Hot flashes, menopausal 06/12/2011   Hyperlipidemia    Hypertension    Neck pain 7/09   WITH BULGING DISC-----RECIEVING EPIDURALS    Sinusitis     Past Surgical History:  Procedure Laterality Date   ABDOMINAL EXPLORATION SURGERY  age 26   bowel obstruction, [facility]   ABDOMINAL HYSTERECTOMY  2007   [facility], [facility]   BILATERAL EYE SURGERY     for glaucoma   BIOPSY  01/09/2019   Procedure: BIOPSY;  Surgeon: West Bali, MD;  Location: [facility];  Service: Endoscopy;;  gstric    bone removed from under tongue     BREAST BIOPSY Left    benign   BREAST SURGERY Left 2004   left partial mastectomy, [facility]   CATARACT EXTRACTION  Right 06/04/2015   CATARACT EXTRACTION W/PHACO  05/16/2011   Procedure: CATARACT EXTRACTION PHACO AND INTRAOCULAR LENS PLACEMENT (IOC);  Surgeon: Gemma Payor;  Location: [facility];  Service: Ophthalmology;  Laterality: Right;   COLONOSCOPY N/A 05/17/2013   Dr. Cyndi Bender diverticulosis was noted/small internal hemorrhoids   COLONOSCOPY WITH PROPOFOL N/A 04/12/2021   Colonoscopy June 2022: small anal sphincter s/p rectal surgery, internal hemorrhoids, one 2 mm polyp in cecum (benign). 10 year surveillance.   CYST REMOVED LEFT BREAST /BENIGN  2005   left, [facility]   ESOPHAGOGASTRODUODENOSCOPY N/A 01/09/2019   normal esophagus. Mild NSAID gastritis s/p biopsy. +H.pylori. treated with amoxicillin, biaxin, Nexium.Marland Kitchen ERADICATION DOCUMENTED   EYE SURGERY  2010, 1992 approx   bilateral   POLYPECTOMY  04/12/2021   Procedure: POLYPECTOMY;  Surgeon: Lanelle Bal, DO;  Location: [facility];  Service: Endoscopy;;   REPAIR IMPERFORATE ANUS / ANORECTOPLASTY     per patient   TOTAL ABDOMINAL HYSTERECTOMY W/ BILATERAL SALPINGOOPHORECTOMY  2007   [facility], [facility]   TUBAL LIGATION  1991    Current Outpatient Medications  Medication Sig Dispense Refill   cholecalciferol (VITAMIN D3) 25 MCG (1000 UNIT) tablet Take 1,000 Units by mouth daily.     conjugated estrogens (PREMARIN) vaginal cream APPLY A FINGERTIP AMOUNT 3 TIMES  WEEKLY VAGINALLY AS NEEDED. 30 g 0   cycloSPORINE (RESTASIS) 0.05 % ophthalmic emulsion Place 1 drop into both eyes 2 (two) times daily.     esomeprazole (NEXIUM) 40 MG capsule TAKE 1 CAPSULE BY MOUTH TWICE  DAILY 60 capsule 11   Fezolinetant (VEOZAH) 45 MG TABS Take 1 tablet (45 mg total) by mouth daily. 30 tablet 2   fluticasone (FLONASE) 50 MCG/ACT nasal spray Place 1 spray into both nostrils daily as needed for allergies or rhinitis. 16 mL 2   gabapentin (NEURONTIN) 300 MG capsule TAKE 1 CAPSULE BY MOUTH TWICE  DAILY 60 capsule 11   hydrocortisone (ANUSOL-HC) 2.5 % rectal cream APPLY (1)  APPLICATION TWICE DAILY ( FOR RECTAL ITCHING AND BURNING) 30 g 0   hydrOXYzine (ATARAX) 10 MG tablet TAKE ONE TABLET BY MOUTH ONCE DAILY AS NEEDED FOR ITCHING. 30 tablet 0   montelukast (SINGULAIR) 10 MG tablet Take 1 tablet (10 mg total) by mouth daily. 90 tablet 1   Multiple Vitamin (MULTIVITAMIN) capsule Take 1 capsule by mouth daily.     rosuvastatin (CRESTOR) 10 MG tablet TAKE (1) TABLET BY MOUTH AT BEDTIME. 90 tablet 1   TRAVATAN Z 0.004 % SOLN ophthalmic solution Place 1 drop into both eyes at bedtime.     triamterene-hydrochlorothiazide (MAXZIDE-25) 37.5-25 MG tablet TAKE (1 & 1/2) TABLETS BY MOUTH DAILY FOR BLOOD PRESSURE. 135 tablet 0   No current facility-administered medications for this visit.    Allergies as of 06/08/2023 - Review Complete 06/08/2023  Allergen Reaction Noted   Acyclovir and related Rash 03/02/2015    Family History  Adopted: Yes  Problem Relation Age of Onset   Alcohol abuse Mother    Stomach cancer Mother 63   Lung cancer Father    Glaucoma Father    Alcohol abuse Father    Seizures Father    Breast cancer Sister 32   Breast cancer Sister 45   Heart disease Sister    Heart attack Sister    Glaucoma Son    Glaucoma Maternal Grandfather    Cancer Maternal Grandfather        poss leukemia   SIDS Brother    Hyperthyroidism Daughter    Seizures Son        as a Development worker, international aid   Kidney disease Maternal Grandmother    Heart disease Maternal Grandmother        Visual merchandiser   Leukemia Maternal Grandmother    Colon cancer Neg Hx     Social History   Socioeconomic History   Marital status: Significant Other    Spouse name: Not on file   Number of children: 3   Years of education: 12   Highest education level: 12th grade  Occupational History   Occupation: UNEMPLOYED 2011    Comment: disability    Employer: DISABLED  Tobacco Use   Smoking status: Former    Current packs/day: 0.00    Average packs/day: 3.0 packs/day for 2.0 years (6.0 ttl pk-yrs)     Types: Cigarettes    Start date: 05/11/1989    Quit date: 05/12/1991    Years since quitting: 32.0   Smokeless tobacco: Never  Vaping Use   Vaping status: Never Used  Substance and Sexual Activity   Alcohol use: Yes    Comment: occasionally   Drug use: Never   Sexual activity: Yes    Birth control/protection: Surgical  Other Topics Concern   Not on file  Social History Narrative   Lives  with son, Viviann Spare. 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   caffien- coffee, 1 cup daily   Social Determinants of Health   Financial Resource Strain: Low Risk  (09/07/2021)   Overall Financial Resource Strain (CARDIA)    Difficulty of Paying Living Expenses: Not hard at all  Food Insecurity: No Food Insecurity (09/07/2021)   Hunger Vital Sign    Worried About Running Out of Food in the Last Year: Never true    Ran Out of Food in the Last Year: Never true  Transportation Needs: No Transportation Needs (09/07/2021)   PRAPARE - Administrator, Civil Service (Medical): No    Lack of Transportation (Non-Medical): No  Physical Activity: Insufficiently Active (09/07/2021)   Exercise Vital Sign    Days of Exercise per Week: 3 days    Minutes of Exercise per Session: 20 min  Stress: No Stress Concern Present (09/07/2021)   [employer] of Occupational Health - Occupational Stress Questionnaire    Feeling of Stress : Not at all  Social Connections: Moderately Isolated (09/07/2021)   Social Connection and Isolation Panel [NHANES]    Frequency of Communication with Friends and Family: Three times a week    Frequency of Social Gatherings with Friends and Family: Twice a week    Attends Religious Services: 1 to 4 times per year    Active Member of [employer] or Organizations: No    Attends Banker Meetings: Never    Marital Status: Divorced  Catering manager Violence: Not At Risk (09/07/2021)   Humiliation, Afraid, Rape, and Kick questionnaire    Fear of Current or Ex-Partner: No    Emotionally Abused: No     Physically Abused: No    Sexually Abused: No     Review of Systems   Gen: Denies any fever, chills, fatigue, weight loss, lack of appetite.  CV: Denies chest pain, heart palpitations, peripheral edema, syncope.  Resp: Denies shortness of breath at rest or with exertion. Denies wheezing or cough.  GI: Denies dysphagia or odynophagia. Denies jaundice, hematemesis, fecal incontinence. GU : Denies urinary burning, urinary frequency, urinary hesitancy MS: Denies joint pain, muscle weakness, cramps, or limitation of movement.  Derm: Denies rash, itching, dry skin Psych: Denies depression, anxiety, memory loss, and confusion Heme: Denies bruising, bleeding, and enlarged lymph nodes.   Physical Exam   BP 114/77 (BP Location: Right Arm, Patient Position: Sitting, Cuff Size: Normal)   Pulse 86   Temp 97.8 F (36.6 C) (Oral)   Ht 5\' 1"  (1.549 m)   Wt 123 lb 12.8 oz (56.2 kg)   SpO2 100%   BMI 23.39 kg/m  General:   Alert and oriented. Pleasant and cooperative. Well-nourished and well-developed.  Head:  Normocephalic and atraumatic. Eyes:  Without icterus Abdomen:  +BS, soft, non-tender and non-distended. No HSM noted. No guarding or rebound. No masses appreciated.  Rectal:  Deferred  Msk:  Symmetrical without gross deformities. Normal posture. Extremities:  Without edema. Neurologic:  Alert and  oriented x4;  grossly normal neurologically. Skin:  Intact without significant lesions or rashes. Psych:  Alert and cooperative. Normal mood and affect.   Assessment   CHRYEL Miller is a 62 y.o. female presenting today with a history of chronic constipation, GERD, +H.yplori 2020 with documented eradication via breath test in Nov 2021, for routine yearly follow-up.   GERD: well managed alternating with Nexium and Pepcid. No dysphagia or alarm signs/symptoms.  Constipation: off Linzess and only taking supplemental fiber now with  good results.  Bloating: noted life-long. She does  endorse foods that would precipitate this. Will check celiac serologies for completeness' sake but suspect this negative. Low FODMAP diet provided.      PLAN    Celiac serologies Continue with Nexium and pepcid prn Low FODMAP diet trial 3 month follow-up Colonoscopy in 2032 barring any clinical changes    Gelene Mink, PhD, [facility] Gastroenterology

## 2023-06-08 NOTE — Telephone Encounter (Signed)
Patient called asked when does she need to get her flu shot?

## 2023-06-09 NOTE — Telephone Encounter (Signed)
Sent mychart message that we start giving the vaccines around the 1st of Sept (they are always shipped sometime the end of August)
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
## 2023-06-28 ENCOUNTER — Encounter: Payer: Self-pay | Admitting: Family Medicine

## 2023-06-28 ENCOUNTER — Telehealth: Payer: 59 | Admitting: Psychiatry

## 2023-06-28 ENCOUNTER — Encounter: Payer: Self-pay | Admitting: Psychiatry

## 2023-06-28 ENCOUNTER — Ambulatory Visit (INDEPENDENT_AMBULATORY_CARE_PROVIDER_SITE_OTHER): Payer: 59 | Admitting: Family Medicine

## 2023-06-28 VITALS — BP 138/85 | HR 77 | Ht 61.0 in | Wt 127.1 lb

## 2023-06-28 DIAGNOSIS — R300 Dysuria: Secondary | ICD-10-CM

## 2023-06-28 DIAGNOSIS — Z13220 Encounter for screening for lipoid disorders: Secondary | ICD-10-CM | POA: Diagnosis not present

## 2023-06-28 DIAGNOSIS — F3341 Major depressive disorder, recurrent, in partial remission: Secondary | ICD-10-CM

## 2023-06-28 DIAGNOSIS — I10 Essential (primary) hypertension: Secondary | ICD-10-CM | POA: Diagnosis not present

## 2023-06-28 DIAGNOSIS — E559 Vitamin D deficiency, unspecified: Secondary | ICD-10-CM

## 2023-06-28 DIAGNOSIS — E785 Hyperlipidemia, unspecified: Secondary | ICD-10-CM | POA: Diagnosis not present

## 2023-06-28 DIAGNOSIS — F330 Major depressive disorder, recurrent, mild: Secondary | ICD-10-CM | POA: Diagnosis not present

## 2023-06-28 DIAGNOSIS — Z1329 Encounter for screening for other suspected endocrine disorder: Secondary | ICD-10-CM

## 2023-06-28 MED ORDER — VENLAFAXINE HCL ER 75 MG PO CP24: 75.0000 mg | ORAL_CAPSULE | Freq: Every day | ORAL | 2 refills | Status: DC

## 2023-06-28 MED ORDER — TRIAMTERENE-HCTZ 75-50 MG PO TABS: 1.0000 | ORAL_TABLET | Freq: Every day | ORAL | 3 refills | Status: DC

## 2023-06-28 NOTE — Patient Instructions (Signed)
Annual exam in 6 to 8 weeks, also  reevaluate blood pressure at that visit.    Call if you need me sooner.    Blood pressure is elevated new higher dose of  Maxide75 mg tablet , one daily, please collect at your pharmacy today.  Labs today CBC TSH vitamin D.  Recent labs show that you need to reduce your butter cheese and fried foods since your triglycerides are a bit high.  Urine is being checked in the office to see if there is infection based on your 3-day history of frequency  and  discomfort.  If the test is abnormal I will send in a 3-day course of antibiotic and follow-up on the culture, we will be in touch with you with results.   Thanks for choosing [facility], we consider it a privelige to serve you.

## 2023-06-28 NOTE — Patient Instructions (Signed)
Restart venlafaxine 75 mg daily   2. Next appointment: 11/13 at 8 40 am

## 2023-06-29 LAB — CBC
Hematocrit: 42.4 % (ref 34.0–46.6)
Hemoglobin: 14.3 g/dL (ref 11.1–15.9)
MCH: 30.2 pg (ref 26.6–33.0)
MCHC: 33.7 g/dL (ref 31.5–35.7)
MCV: 90 fL (ref 79–97)
Platelets: 266 10*3/uL (ref 150–450)
RBC: 4.73 x10E6/uL (ref 3.77–5.28)
RDW: 12.6 % (ref 11.7–15.4)
WBC: 7 10*3/uL (ref 3.4–10.8)

## 2023-06-29 LAB — TSH: TSH: 1.71 u[IU]/mL (ref 0.450–4.500)

## 2023-06-29 LAB — VITAMIN D 25 HYDROXY (VIT D DEFICIENCY, FRACTURES): Vit D, 25-Hydroxy: 52.3 ng/mL (ref 30.0–100.0)

## 2023-06-30 ENCOUNTER — Encounter: Payer: Self-pay | Admitting: Family Medicine

## 2023-06-30 DIAGNOSIS — R300 Dysuria: Secondary | ICD-10-CM | POA: Insufficient documentation

## 2023-06-30 NOTE — Assessment & Plan Note (Signed)
Uncontrolled, inc maxzide dose and re assess in 8 weeks DASH diet and commitment to daily physical activity for a minimum of 30 minutes discussed and encouraged, as a part of hypertension management. The importance of attaining a healthy weight is also discussed.     06/28/2023   10:02 AM 06/28/2023   10:00 AM 06/08/2023    8:23 AM 04/21/2023   10:35 AM 03/23/2023   11:34 AM 12/22/2022   12:46 PM 12/08/2022   10:54 AM  BP/Weight  Systolic BP 138 145 114 132 122 140 133  Diastolic BP 85 100 77 85 88 96 83  Wt. (Lbs)  127.08 123.8 123.04 121.4 122.2 123.04  BMI  24.01 kg/m2 23.39 kg/m2 23.25 kg/m2 22.94 kg/m2 23.09 kg/m2 23.25 kg/m2    '

## 2023-06-30 NOTE — Assessment & Plan Note (Signed)
Hyperlipidemia:Low fat diet discussed and encouraged.   Lipid Panel  Lab Results  Component Value Date   CHOL 189 04/11/2023   HDL 68 04/11/2023   LDLCALC 93 04/11/2023   TRIG 168 (H) 04/11/2023   CHOLHDL 2.8 04/11/2023     TG high, needs to reduce ietatry fat

## 2023-06-30 NOTE — Assessment & Plan Note (Signed)
Controlled and managed by Psychiatry 

## 2023-06-30 NOTE — Assessment & Plan Note (Signed)
Acute onset with abn UA, c/S pending ,no antibiotic sent

## 2023-06-30 NOTE — Progress Notes (Signed)
   LAURALEI TROUGHTON     MRN: [id number]      DOB: 1961/09/11  Chief Complaint  Patient presents with   Follow-up    Follow up, reports staying cold all the time, eye pain seen eye doctor but eye hurts    HPI Ms. Hattan is here for follow up and re-evaluation of chronic medical conditions, medication management and review of any available recent lab and radiology data.  Preventive health is updated, specifically  Cancer screening and Immunization.   Questions or concerns regarding consultations or procedures which the PT has had in the interim are  addressed. The PT denies any adverse reactions to current medications since the last visit.  States she feels cold ll the time, will screen for anemi, abn thyroid function and vit D def Has had partial removal ogf great toenailROS Denies recent fever or chills. Denies sinus pressure, nasal congestion, ear pain or sore throat. Denies chest congestion, productive cough or wheezing. Denies chest pains, palpitations and leg swelling Denies abdominal pain, nausea, vomiting,diarrhea or constipation.   Denies dysuria, frequency, hesitancy or incontinence. Denies joint pain, swelling and limitation in mobility. Denies headaches, seizures, numbness, or tingling. Denies depression, anxiety or insomnia. Denies skin break down or rash.   PE  BP 138/85 (BP Location: Left Arm, Patient Position: Sitting, Cuff Size: Normal)   Pulse 77   Ht 5\' 1"  (1.549 m)   Wt 127 lb 1.3 oz (57.6 kg)   SpO2 98%   BMI 24.01 kg/m   Patient alert and oriented and in no cardiopulmonary distress.  HEENT: No facial asymmetry, EOMI,     Neck supple .  Chest: Clear to auscultation bilaterally.  CVS: S1, S2 no murmurs, no S3.Regular rate.  ABD: Soft non tender.   Ext: No edema  MS: Adequate ROM spine, shoulders, hips and knees.  Skin: Intact, no ulcerations or rash noted.  Psych: Good eye contact, normal affect. Memory intact not anxious or depressed  appearing.  CNS: CN 2-12 intact, power,  normal throughout.no focal deficits noted.   Assessment & Plan  Dysuria Acute onset with abn UA, c/S pending ,no antibiotic sent  Depression, major, recurrent, in partial remission (HCC) Controlled and managed by Psychiatry  Hyperlipidemia with target LDL less than 100 Hyperlipidemia:Low fat diet discussed and encouraged.   Lipid Panel  Lab Results  Component Value Date   CHOL 189 04/11/2023   HDL 68 04/11/2023   LDLCALC 93 04/11/2023   TRIG 168 (H) 04/11/2023   CHOLHDL 2.8 04/11/2023     TG high, needs to reduce ietatry fat  Hypertension Uncontrolled, inc maxzide dose and re assess in 8 weeks DASH diet and commitment to daily physical activity for a minimum of 30 minutes discussed and encouraged, as a part of hypertension management. The importance of attaining a healthy weight is also discussed.     06/28/2023   10:02 AM 06/28/2023   10:00 AM 06/08/2023    8:23 AM 04/21/2023   10:35 AM 03/23/2023   11:34 AM 12/22/2022   12:46 PM 12/08/2022   10:54 AM  BP/Weight  Systolic BP 138 145 114 132 122 140 133  Diastolic BP 85 100 77 85 88 96 83  Wt. (Lbs)  127.08 123.8 123.04 121.4 122.2 123.04  BMI  24.01 kg/m2 23.39 kg/m2 23.25 kg/m2 22.94 kg/m2 23.09 kg/m2 23.25 kg/m2    '

## 2023-07-04 ENCOUNTER — Other Ambulatory Visit: Payer: Self-pay | Admitting: Family Medicine

## 2023-07-14 ENCOUNTER — Other Ambulatory Visit: Payer: Self-pay

## 2023-07-14 MED ORDER — TRIAMTERENE-HCTZ 75-50 MG PO TABS: 1.0000 | ORAL_TABLET | Freq: Every day | ORAL | 3 refills | Status: DC

## 2023-07-31 ENCOUNTER — Inpatient Hospital Stay (HOSPITAL_COMMUNITY): Admission: RE | Admit: 2023-07-31 | Payer: 59 | Source: Ambulatory Visit

## 2023-08-02 ENCOUNTER — Ambulatory Visit (HOSPITAL_COMMUNITY)
Admission: RE | Admit: 2023-08-02 | Discharge: 2023-08-02 | Disposition: A | Discharge status: Home / Self Care | Payer: 59 | Source: Ambulatory Visit | Attending: Family Medicine | Admitting: Family Medicine

## 2023-08-02 ENCOUNTER — Encounter (HOSPITAL_COMMUNITY): Payer: Self-pay

## 2023-08-02 DIAGNOSIS — Z1231 Encounter for screening mammogram for malignant neoplasm of breast: Secondary | ICD-10-CM | POA: Insufficient documentation

## 2023-08-23 ENCOUNTER — Encounter: Payer: Self-pay | Admitting: Family Medicine

## 2023-08-23 ENCOUNTER — Ambulatory Visit (INDEPENDENT_AMBULATORY_CARE_PROVIDER_SITE_OTHER): Payer: 59 | Admitting: Family Medicine

## 2023-08-23 VITALS — BP 128/82 | HR 100 | Ht 61.0 in | Wt 121.0 lb

## 2023-08-23 DIAGNOSIS — M25552 Pain in left hip: Secondary | ICD-10-CM | POA: Insufficient documentation

## 2023-08-23 DIAGNOSIS — I10 Essential (primary) hypertension: Secondary | ICD-10-CM

## 2023-08-23 DIAGNOSIS — Z1212 Encounter for screening for malignant neoplasm of rectum: Secondary | ICD-10-CM | POA: Insufficient documentation

## 2023-08-23 DIAGNOSIS — E785 Hyperlipidemia, unspecified: Secondary | ICD-10-CM

## 2023-08-23 DIAGNOSIS — Z0001 Encounter for general adult medical examination with abnormal findings: Secondary | ICD-10-CM | POA: Diagnosis not present

## 2023-08-23 DIAGNOSIS — Z23 Encounter for immunization: Secondary | ICD-10-CM | POA: Diagnosis not present

## 2023-08-23 NOTE — Assessment & Plan Note (Signed)
Annual exam as documented. Counseling done  re healthy lifestyle involving commitment to 150 minutes exercise per week, heart healthy diet, and attaining healthy weight.The importance of adequate sleep also discussed.  Immunization and cancer screening needs are specifically addressed at this visit.  

## 2023-08-23 NOTE — Assessment & Plan Note (Signed)
2 day history, no inciting trauma, exam shows slightly reduced ROM due to pain, tylenol 1 tab in office, will call if persists or worsens

## 2023-08-23 NOTE — Assessment & Plan Note (Signed)
After obtaining informed consent, the vaccine is  administered , with no adverse effect noted at the time of administration.  

## 2023-08-23 NOTE — Progress Notes (Signed)
    Toni Miller     MRN: [id number]      DOB: October 21, 1961  Chief Complaint  Patient presents with   Follow-up   Annual Exam    HPI: Patient is in for annual physical exam. 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 Immunization is reviewed , and  updated if needed.   PE: BP 128/82 (BP Location: Right Arm, Patient Position: Sitting, Cuff Size: Normal)   Pulse 100   Ht 5\' 1"  (1.549 m)   Wt 121 lb 0.6 oz (54.9 kg)   SpO2 95%   BMI 22.87 kg/m   Pleasant  female, alert and oriented x 3, in no cardio-pulmonary distress. Afebrile. HEENT No facial trauma or asymetry. Sinuses non tender.  Extra occullar muscles intact.. External ears normal, . Neck: supple, no adenopathy,JVD or thyromegaly.No bruits.  Chest: Clear to ascultation bilaterally.No crackles or wheezes. Non tender to palpation  Breast: Mammogram in 2024 normal, asymptomatic, not examined  Cardiovascular system; Heart sounds normal,  S1 and  S2 ,no S3.  No murmur, or thrill. Apical beat not displaced Peripheral pulses normal.  Abdomen: Soft, non tender, no organomegaly or masses. No bruits. Bowel sounds normal. No guarding, tenderness or rebound.     Musculoskeletal exam: Full ROM of spine,  , shoulders and knees.Decreased in left hip No deformity ,swelling or crepitus noted. No muscle wasting or atrophy.   Neurologic: Cranial nerves 2 to 12 intact. Power, tone ,sensation and reflexes normal throughout. No disturbance in gait. No tremor.  Skin: Intact, no ulceration, erythema , scaling or rash noted. Pigmentation normal throughout  Psych; Normal mood and affect. Judgement and concentration normal   Assessment & Plan:  Encounter for Medicare annual examination with abnormal findings Annual exam as documented. Counseling done  re healthy lifestyle involving commitment to 150 minutes exercise per week, heart healthy diet, and attaining healthy  weight.The importance of adequate sleep also discussed. . Immunization and cancer screening needs are specifically addressed at this visit.   Immunization due After obtaining informed consent, the vaccine is  administered , with no adverse effect noted at the time of administration.   Left hip pain 2 day history, no inciting trauma, exam shows slightly reduced ROM due to pain, tylenol 1 tab in office, will call if persists or worsens

## 2023-08-23 NOTE — Patient Instructions (Addendum)
F/U in early  March , call if you need me sooner  Pls schedule AWV 10/25 or after at checkout  Flu vaccine today  Pls check on , document and let pt know re covid and RSV vaccines, needs both if none  Tylenol one tablet in office for left hip pain  Fasting lipid, cmp and eGFr 1 week before f/u appointment  Discuss dose of nexium with GI at next visit  since yo report taking med once daily with success  Thanks for choosing [facility], we consider it a privelige to serve you.

## 2023-09-12 ENCOUNTER — Other Ambulatory Visit: Payer: Self-pay | Admitting: Family Medicine

## 2023-09-12 ENCOUNTER — Ambulatory Visit (INDEPENDENT_AMBULATORY_CARE_PROVIDER_SITE_OTHER): Payer: 59 | Admitting: Gastroenterology

## 2023-09-12 ENCOUNTER — Encounter: Payer: Self-pay | Admitting: Gastroenterology

## 2023-09-12 ENCOUNTER — Ambulatory Visit (INDEPENDENT_AMBULATORY_CARE_PROVIDER_SITE_OTHER): Payer: 59 | Admitting: Family Medicine

## 2023-09-12 ENCOUNTER — Encounter: Payer: Self-pay | Admitting: Family Medicine

## 2023-09-12 VITALS — BP 128/82 | HR 93 | Temp 99.2°F | Resp 16 | Ht 62.0 in | Wt 123.1 lb

## 2023-09-12 VITALS — BP 121/84 | HR 106 | Temp 98.6°F | Ht 62.0 in | Wt 122.4 lb

## 2023-09-12 DIAGNOSIS — J029 Acute pharyngitis, unspecified: Secondary | ICD-10-CM | POA: Insufficient documentation

## 2023-09-12 DIAGNOSIS — K5900 Constipation, unspecified: Secondary | ICD-10-CM | POA: Diagnosis not present

## 2023-09-12 DIAGNOSIS — I10 Essential (primary) hypertension: Secondary | ICD-10-CM

## 2023-09-12 DIAGNOSIS — J040 Acute laryngitis: Secondary | ICD-10-CM | POA: Diagnosis not present

## 2023-09-12 DIAGNOSIS — K219 Gastro-esophageal reflux disease without esophagitis: Secondary | ICD-10-CM

## 2023-09-12 DIAGNOSIS — R140 Abdominal distension (gaseous): Secondary | ICD-10-CM

## 2023-09-12 DIAGNOSIS — K649 Unspecified hemorrhoids: Secondary | ICD-10-CM | POA: Diagnosis not present

## 2023-09-12 DIAGNOSIS — R051 Acute cough: Secondary | ICD-10-CM

## 2023-09-12 DIAGNOSIS — K5909 Other constipation: Secondary | ICD-10-CM

## 2023-09-12 DIAGNOSIS — R059 Cough, unspecified: Secondary | ICD-10-CM | POA: Insufficient documentation

## 2023-09-12 DIAGNOSIS — K642 Third degree hemorrhoids: Secondary | ICD-10-CM

## 2023-09-12 DIAGNOSIS — E785 Hyperlipidemia, unspecified: Secondary | ICD-10-CM

## 2023-09-12 MED ORDER — BENZONATATE 100 MG PO CAPS: 100.0000 mg | ORAL_CAPSULE | Freq: Two times a day (BID) | ORAL | 0 refills | Status: DC | PRN

## 2023-09-12 MED ORDER — PREDNISONE 5 MG PO TABS: 5.0000 mg | ORAL_TABLET | Freq: Two times a day (BID) | ORAL | 0 refills | Status: AC

## 2023-09-12 MED ORDER — AZITHROMYCIN 250 MG PO TABS: ORAL_TABLET | ORAL | 0 refills | Status: AC

## 2023-09-12 MED ORDER — HYDROCORTISONE (PERIANAL) 2.5 % EX CREA: 1.0000 | TOPICAL_CREAM | Freq: Two times a day (BID) | CUTANEOUS | 1 refills | Status: DC

## 2023-09-12 MED ORDER — PROMETHAZINE-DM 6.25-15 MG/5ML PO SYRP: ORAL_SOLUTION | ORAL | 0 refills | Status: DC

## 2023-09-12 NOTE — Progress Notes (Signed)
Gastroenterology Office Note     Primary Care Physician:  Kerri Perches, MD  Primary Gastroenterologist: Dr. Marletta Lor   Chief Complaint   Chief Complaint  Patient presents with   Follow-up    Pt following up on GERD and constipation of which the constipation is better     History of Present Illness   Toni Miller is a 62 y.o. female presenting today with a history of chronic constipation, GERD, +H.yplori 2020 with documented eradication via breath test in Nov 2021. She had banding of all 3 columns, with last in 2023. She is here for follow-up of bloating.   Negative celiac serologies in interim.   Has a cold she can't shake. Nexium daily (although med list stating BID). GERD exacerbated.   Feels bloated. Thinks she has had a hemorrhoid flare again. Itches and burning. No prolonged toilet time. Mild straining. No longer taking benefiber. BM every other day. Was doing well on Benefiber daily. Some decreased bloating after BM. Will have gas at night. Stayed on low FODMAP diet for one week. Noted some improvement.        Past Medical History:  Diagnosis Date   Anxiety    Arthritis    Cancer (HCC) 1993   abnormal pap treated at [facility]   Constipation    Depression    GERD (gastroesophageal reflux disease)    Glaucoma 1993   History of kidney stones    Hot flashes, menopausal 06/12/2011   Hyperlipidemia    Hypertension    Neck pain 7/09   WITH BULGING DISC-----RECIEVING EPIDURALS    Sinusitis     Past Surgical History:  Procedure Laterality Date   ABDOMINAL EXPLORATION SURGERY  age 28   bowel obstruction, [facility]   ABDOMINAL HYSTERECTOMY  2007   [facility], [facility]   BILATERAL EYE SURGERY     for glaucoma   BIOPSY  01/09/2019   Procedure: BIOPSY;  Surgeon: West Bali, MD;  Location: [facility];  Service: Endoscopy;;  gstric    bone removed from under tongue     BREAST BIOPSY Left    benign   BREAST CYST EXCISION Left 2004   BREAST SURGERY Left 2004   left  partial mastectomy, [facility]   CATARACT EXTRACTION Right 06/04/2015   CATARACT EXTRACTION W/PHACO  05/16/2011   Procedure: CATARACT EXTRACTION PHACO AND INTRAOCULAR LENS PLACEMENT (IOC);  Surgeon: Gemma Payor;  Location: [facility];  Service: Ophthalmology;  Laterality: Right;   COLONOSCOPY N/A 05/17/2013   Dr. Cyndi Bender diverticulosis was noted/small internal hemorrhoids   COLONOSCOPY WITH PROPOFOL N/A 04/12/2021   Colonoscopy June 2022: small anal sphincter s/p rectal surgery, internal hemorrhoids, one 2 mm polyp in cecum (benign). 10 year surveillance.   CYST REMOVED LEFT BREAST /BENIGN  2005   left, [facility]   ESOPHAGOGASTRODUODENOSCOPY N/A 01/09/2019   normal esophagus. Mild NSAID gastritis s/p biopsy. +H.pylori. treated with amoxicillin, biaxin, Nexium.Marland Kitchen ERADICATION DOCUMENTED   EYE SURGERY  2010, 1992 approx   bilateral   POLYPECTOMY  04/12/2021   Procedure: POLYPECTOMY;  Surgeon: Lanelle Bal, DO;  Location: [facility];  Service: Endoscopy;;   REPAIR IMPERFORATE ANUS / ANORECTOPLASTY     per patient   TOTAL ABDOMINAL HYSTERECTOMY W/ BILATERAL SALPINGOOPHORECTOMY  2007   [facility], [facility]   TUBAL LIGATION  1991    Current Outpatient Medications  Medication Sig Dispense Refill   cetirizine (ZYRTEC) 5 MG chewable tablet Chew 5 mg by mouth daily.     cholecalciferol (  VITAMIN D3) 25 MCG (1000 UNIT) tablet Take 1,000 Units by mouth daily.     conjugated estrogens (PREMARIN) vaginal cream APPLY FINGERTIP AMOUNT VAGINALLY 3 TIMES WEEKLY AS NEEDED 30 g 2   cycloSPORINE (RESTASIS) 0.05 % ophthalmic emulsion Place 1 drop into both eyes 2 (two) times daily.     esomeprazole (NEXIUM) 40 MG capsule TAKE 1 CAPSULE BY MOUTH TWICE  DAILY 60 capsule 11   fluticasone (FLONASE) 50 MCG/ACT nasal spray Place 1 spray into both nostrils daily as needed for allergies or rhinitis. 16 mL 2   gabapentin (NEURONTIN) 300 MG capsule TAKE 1 CAPSULE BY MOUTH TWICE  DAILY 60 capsule 11   hydrocortisone (ANUSOL-HC) 2.5 %  rectal cream APPLY (1) APPLICATION TWICE DAILY ( FOR RECTAL ITCHING AND BURNING) 30 g 0   montelukast (SINGULAIR) 10 MG tablet Take 1 tablet (10 mg total) by mouth daily. 90 tablet 1   Multiple Vitamin (MULTIVITAMIN) capsule Take 1 capsule by mouth daily.     rosuvastatin (CRESTOR) 10 MG tablet TAKE (1) TABLET BY MOUTH AT BEDTIME. 90 tablet 1   TRAVATAN Z 0.004 % SOLN ophthalmic solution Place 1 drop into both eyes at bedtime.     triamterene-hydrochlorothiazide (MAXZIDE) 75-50 MG tablet Take 1 tablet by mouth daily. 30 tablet 3   venlafaxine XR (EFFEXOR-XR) 75 MG 24 hr capsule Take 1 capsule (75 mg total) by mouth daily with breakfast. 30 capsule 2   No current facility-administered medications for this visit.    Allergies as of 09/12/2023 - Review Complete 09/12/2023  Allergen Reaction Noted   Acyclovir and related Rash 03/02/2015    Family History  Adopted: Yes  Problem Relation Age of Onset   Alcohol abuse Mother    Stomach cancer Mother 61   Lung cancer Father    Glaucoma Father    Alcohol abuse Father    Seizures Father    Breast cancer Sister 26   Breast cancer Sister 23   Heart disease Sister    Heart attack Sister    Glaucoma Son    Glaucoma Maternal Grandfather    Cancer Maternal Grandfather        poss leukemia   SIDS Brother    Hyperthyroidism Daughter    Seizures Son        as a Development worker, international aid   Kidney disease Maternal Grandmother    Heart disease Maternal Grandmother        Visual merchandiser   Leukemia Maternal Grandmother    Colon cancer Neg Hx     Social History   Socioeconomic History   Marital status: Significant Other    Spouse name: Not on file   Number of children: 3   Years of education: 12   Highest education level: 12th grade  Occupational History   Occupation: UNEMPLOYED 2011    Comment: disability    Employer: DISABLED  Tobacco Use   Smoking status: Former    Current packs/day: 0.00    Average packs/day: 3.0 packs/day for 2.0 years (6.0 ttl pk-yrs)     Types: Cigarettes    Start date: 05/11/1989    Quit date: 05/12/1991    Years since quitting: 32.3   Smokeless tobacco: Never  Vaping Use   Vaping status: Never Used  Substance and Sexual Activity   Alcohol use: Yes    Comment: occasionally   Drug use: Never   Sexual activity: Yes    Birth control/protection: Surgical  Other Topics Concern   Not on file  Social History Narrative   Lives with son, Viviann Spare. 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   caffien- coffee, 1 cup daily   Social Determinants of Health   Financial Resource Strain: Low Risk  (09/07/2021)   Overall Financial Resource Strain (CARDIA)    Difficulty of Paying Living Expenses: Not hard at all  Food Insecurity: No Food Insecurity (09/07/2021)   Hunger Vital Sign    Worried About Running Out of Food in the Last Year: Never true    Ran Out of Food in the Last Year: Never true  Transportation Needs: No Transportation Needs (09/07/2021)   PRAPARE - Administrator, Civil Service (Medical): No    Lack of Transportation (Non-Medical): No  Physical Activity: Insufficiently Active (09/07/2021)   Exercise Vital Sign    Days of Exercise per Week: 3 days    Minutes of Exercise per Session: 20 min  Stress: No Stress Concern Present (09/07/2021)   [employer] of Occupational Health - Occupational Stress Questionnaire    Feeling of Stress : Not at all  Social Connections: Moderately Isolated (09/07/2021)   Social Connection and Isolation Panel [NHANES]    Frequency of Communication with Friends and Family: Three times a week    Frequency of Social Gatherings with Friends and Family: Twice a week    Attends Religious Services: 1 to 4 times per year    Active Member of [employer] or Organizations: No    Attends Banker Meetings: Never    Marital Status: Divorced  Catering manager Violence: Not At Risk (09/07/2021)   Humiliation, Afraid, Rape, and Kick questionnaire    Fear of Current or Ex-Partner: No    Emotionally  Abused: No    Physically Abused: No    Sexually Abused: No     Review of Systems   Gen: Denies any fever, chills, fatigue, weight loss, lack of appetite.  CV: Denies chest pain, heart palpitations, peripheral edema, syncope.  Resp: Denies shortness of breath at rest or with exertion. Denies wheezing or cough.  GI: Denies dysphagia or odynophagia. Denies jaundice, hematemesis, fecal incontinence. GU : Denies urinary burning, urinary frequency, urinary hesitancy MS: Denies joint pain, muscle weakness, cramps, or limitation of movement.  Derm: Denies rash, itching, dry skin Psych: Denies depression, anxiety, memory loss, and confusion Heme: Denies bruising, bleeding, and enlarged lymph nodes.   Physical Exam   BP 121/84   Pulse (!) 106   Temp 98.6 F (37 C)   Ht 5\' 2"  (1.575 m)   Wt 122 lb 6.4 oz (55.5 kg)   BMI 22.39 kg/m  General:   Alert and oriented. Pleasant and cooperative. Well-nourished and well-developed.  Head:  Normocephalic and atraumatic. Eyes:  Without icterus Rectal:  no prolapsing hemorrhoids, no mass on DRE.  Msk:  Symmetrical without gross deformities. Normal posture. Extremities:  Without edema. Neurologic:  Alert and  oriented x4;  grossly normal neurologically. Skin:  Intact without significant lesions or rashes. Psych:  Alert and cooperative. Normal mood and affect.   Assessment   Toni Miller is a 62 y.o. female presenting today with a history of  chronic constipation, GERD, +H.yplori 2020 with documented eradication via breath test in Nov 2021, hemorrhoid banding, bloating, for routine follow-up.  Constipation: with abdominal bloating. She has done well in the past on Benefiber daily but currently not taking this daily. I have asked she resume daily. Trial of low FODMAP diet again and follow for several weeks, as she noted improvement on  this the one week she did the diet. She can then add in foods from the "high" FODMAP list one at a time after  several weeks to see which ones cause worsened bloating. Celiac serologies negative. No alarm signs/symptoms.  Hemorrhoids: banding in the past. Start Anusol BID for next week. Continued short toilet time and avoid straining. Add benefiber daily. Could consider banding at next visit or sooner if needed.   GERD: taking Nexium daily. Still with symptoms. Continue Nexium daily and add pepcid in evenings prn. If no improvement, she can increase Nexium to BID for short-term. She is to message with update regarding this.    PLAN   Benefiber daily Low FODMAP diet trial Anusol per rectum bid Nexium daily, add pepcid in evenings, increase Nexium to BID if needed for short course Return in 3 months   Gelene Mink, PhD, [facility]

## 2023-09-12 NOTE — Patient Instructions (Signed)
For now, take Nexium once a day and add Pepcid (over-the-counter) in the evening. If this does not help your indigestion/reflux, then you can increase Nexium to twice a day, 30 minutes before breakfast and dinner. Message me on MyChart with how you do!   I have sent in anusol steroid cream for your hemorrhoids. We can try banding again in the future if needed.  Take the fiber every day. This has helped you in the past, so let's get back on that. Try the low FODMAP diet for a few weeks. You can slowly advance to adding the foods from the right-hand column after a few weeks, which will show you which foods to avoid if you have bloating with this!  We will see you in 3 months!  I enjoyed seeing you again today! I value our relationship and want to provide genuine, compassionate, and quality care. You may receive a survey regarding your visit with me, and I welcome your feedback! Thanks so much for taking the time to complete this. I look forward to seeing you again.      Gelene Mink, PhD, ANP-BC [facility]

## 2023-09-12 NOTE — Patient Instructions (Addendum)
F/u as before, call if you need me sooner  You are treated for acute laryngitis, pharyngitis and cough   Prednisone, tessalon perles, Z pack and phenergan dM are prescribed  VOICE REST speak as little as possible for the next 1 wek  Salt water gargles 3 times daily   Thanks for choosing [facility], we consider it a privelige to serve you.
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
## 2023-09-17 NOTE — Assessment & Plan Note (Signed)
Voice rest and short course of prednisone

## 2023-09-17 NOTE — Assessment & Plan Note (Signed)
Z pack and prednisone prescribed

## 2023-09-17 NOTE — Assessment & Plan Note (Signed)
Hyperlipidemia:Low fat diet discussed and encouraged.   Lipid Panel  Lab Results  Component Value Date   CHOL 189 04/11/2023   HDL 68 04/11/2023   LDLCALC 93 04/11/2023   TRIG 168 (H) 04/11/2023   CHOLHDL 2.8 04/11/2023     Needs to reduce fat in diet

## 2023-09-17 NOTE — Assessment & Plan Note (Signed)
Increased and disturbing esp when lying down, short course of bedtime phenergan DM, as needed

## 2023-09-17 NOTE — Progress Notes (Signed)
   [name]     MRN: [id number]      DOB: 1961/10/13  Chief Complaint  Patient presents with   Cough    X 1-2 weeks, not producing much mucus. Dry scratchy sore throat x 4 days from coughing so much and sometimes sounds like she is losing her voice. Has tried otc mucinex without much relief     HPI [name] is here with above complaints ROS Denies recent fever or chills. DDenies chest pains, palpitations and leg swelling Denies abdominal pain, nausea, vomiting,diarrhea or constipation.   Denies dysuria, frequency, hesitancy or incontinence. Denies joint pain, swelling and limitation in mobility. Denies headaches, seizures, numbness, or tingling. Denies  uncontrolled depression, anxiety or insomnia. Denies skin break down or rash.   PE  BP 128/82   Pulse 93   Temp 99.2 F (37.3 C) (Oral)   Resp 16   Ht 5\' 2"  (1.575 m)   Wt 123 lb 1.9 oz (55.8 kg)   SpO2 96%   BMI 22.52 kg/m   Patient alert and oriented and in no cardiopulmonary distress.  HEENT: No facial asymmetry, EOMI,     Neck supple .  Chest: Clear to auscultation bilaterally.  CVS: S1, S2 no murmurs, no S3.Regular rate.  ABD: Soft non tender.   Ext: No edema  MS: Adequate ROM spine, shoulders, hips and knees.  Skin: Intact, no ulcerations or rash noted.  Psych: Good eye contact, normal affect. Memory intact not anxious or depressed appearing.  CNS: CN 2-12 intact, power,  normal throughout.no focal deficits noted.   Assessment & Plan  Acute pharyngitis Z pack and prednisone prescribed  Acute laryngitis Voice rest and short course of prednisone  Cough Increased and disturbing esp when lying down, short course of bedtime phenergan DM, as needed  Hypertension Controlled, no change in medication   Hyperlipidemia with target LDL less than 100 Hyperlipidemia:Low fat diet discussed and encouraged.   Lipid Panel  Lab Results  Component Value Date   CHOL 189 04/11/2023   HDL 68  04/11/2023   LDLCALC 93 04/11/2023   TRIG 168 (H) 04/11/2023   CHOLHDL 2.8 04/11/2023     Needs to reduce fat in diet

## 2023-09-17 NOTE — Assessment & Plan Note (Signed)
Controlled, no change in medication  

## 2023-09-20 ENCOUNTER — Encounter: Payer: Self-pay | Admitting: Psychiatry

## 2023-09-20 ENCOUNTER — Telehealth: Payer: 59 | Admitting: Psychiatry

## 2023-09-20 DIAGNOSIS — F3341 Major depressive disorder, recurrent, in partial remission: Secondary | ICD-10-CM | POA: Diagnosis not present

## 2023-09-20 MED ORDER — VENLAFAXINE HCL ER 75 MG PO CP24: 75.0000 mg | ORAL_CAPSULE | Freq: Every day | ORAL | 2 refills | Status: DC

## 2023-10-20 LAB — CMP14+EGFR
ALT: 25 [IU]/L (ref 0–32)
AST: 22 [IU]/L (ref 0–40)
Albumin: 4.5 g/dL (ref 3.8–4.9)
Alkaline Phosphatase: 73 [IU]/L (ref 44–121)
BUN/Creatinine Ratio: 16 (ref 12–28)
BUN: 15 mg/dL (ref 8–27)
Bilirubin Total: 0.3 mg/dL (ref 0.0–1.2)
CO2: 27 mmol/L (ref 20–29)
Calcium: 9.2 mg/dL (ref 8.7–10.3)
Chloride: 100 mmol/L (ref 96–106)
Creatinine, Ser: 0.91 mg/dL (ref 0.57–1.00)
Globulin, Total: 2.9 g/dL (ref 1.5–4.5)
Glucose: 84 mg/dL (ref 70–99)
Potassium: 3.8 mmol/L (ref 3.5–5.2)
Sodium: 141 mmol/L (ref 134–144)
Total Protein: 7.4 g/dL (ref 6.0–8.5)
eGFR: 72 mL/min/{1.73_m2} (ref >59)

## 2023-10-20 LAB — LIPID PANEL
Chol/HDL Ratio: 2.5 {ratio} (ref 0.0–4.4)
Cholesterol, Total: 185 mg/dL (ref 100–199)
HDL: 73 mg/dL (ref >39)
LDL Chol Calc (NIH): 95 mg/dL (ref 0–99)
Triglycerides: 95 mg/dL (ref 0–149)
VLDL Cholesterol Cal: 17 mg/dL (ref 5–40)

## 2023-10-25 ENCOUNTER — Ambulatory Visit (INDEPENDENT_AMBULATORY_CARE_PROVIDER_SITE_OTHER): Payer: 59

## 2023-10-25 VITALS — Ht 61.0 in | Wt 125.6 lb

## 2023-10-25 DIAGNOSIS — Z0000 Encounter for general adult medical examination without abnormal findings: Secondary | ICD-10-CM | POA: Diagnosis not present

## 2023-10-25 NOTE — Progress Notes (Signed)
 Because this visit was a virtual/telehealth visit,  certain criteria was not obtained, such a blood pressure, CBG if applicable, and timed get up and go. Any medications not marked as "taking" were not mentioned during the medication reconciliation part of the visit. Any vitals not documented were not able to be obtained due to this being a telehealth visit or patient was unable to self-report a recent blood pressure reading due to a lack of equipment at home via telehealth. Vitals that have been documented are verbally provided by the patient.   Subjective:   Toni Miller is a 63 y.o. female who presents for Medicare Annual (Subsequent) preventive examination.  Visit Complete: Virtual I connected with  Toni Miller on 10/25/23 by a audio enabled telemedicine application and verified that I am speaking with the correct person using two identifiers.  Patient Location: Home  Provider Location: Home Office  I discussed the limitations of evaluation and management by telemedicine. The patient expressed understanding and agreed to proceed.  Vital Signs: Because this visit was a virtual/telehealth visit, some criteria may be missing or patient reported. Any vitals not documented were not able to be obtained and vitals that have been documented are patient reported.  Patient Medicare AWV questionnaire was completed by the patient on na; I have confirmed that all information answered by patient is correct and no changes since this date.  Cardiac Risk Factors include: advanced age (>29men, >52 women);dyslipidemia;hypertension;sedentary lifestyle     Objective:    Today's Vitals   10/25/23 0931  Weight: 125 lb 9.6 oz (57 kg)  Height: 5\' 1"  (1.549 m)   Body mass index is 23.73 kg/m.     10/25/2023    9:28 AM 09/07/2021   11:01 AM 04/12/2021    9:39 AM 09/02/2020    3:31 PM 06/29/2020    1:01 PM 08/30/2019    9:40 AM 01/09/2019   11:56 AM  Advanced Directives  Does Patient Have a  Medical Advance Directive? No No Yes No No No No  Type of Advance Directive   Living will      Would patient like information on creating a medical advance directive? Yes (MAU/Ambulatory/Procedural Areas - Information given) No - Patient declined  No - Patient declined  No - Patient declined Yes (MAU/Ambulatory/Procedural Areas - Information given)    Current Medications (verified) Outpatient Encounter Medications as of 10/25/2023  Medication Sig   benzonatate (TESSALON) 100 MG capsule Take 1 capsule (100 mg total) by mouth 2 (two) times daily as needed for cough.   cetirizine (ZYRTEC) 5 MG chewable tablet Chew 5 mg by mouth daily.   cholecalciferol (VITAMIN D3) 25 MCG (1000 UNIT) tablet Take 1,000 Units by mouth daily.   conjugated estrogens (PREMARIN) vaginal cream APPLY FINGERTIP AMOUNT VAGINALLY 3 TIMES WEEKLY AS NEEDED   cycloSPORINE (RESTASIS) 0.05 % ophthalmic emulsion Place 1 drop into both eyes 2 (two) times daily.   esomeprazole (NEXIUM) 40 MG capsule TAKE 1 CAPSULE BY MOUTH TWICE  DAILY   fluticasone (FLONASE) 50 MCG/ACT nasal spray Place 1 spray into both nostrils daily as needed for allergies or rhinitis.   gabapentin (NEURONTIN) 300 MG capsule TAKE 1 CAPSULE BY MOUTH TWICE  DAILY   hydrocortisone (ANUSOL-HC) 2.5 % rectal cream Place 1 Application rectally 2 (two) times daily. Per rectum for itching and burning for 5-7 days.   montelukast (SINGULAIR) 10 MG tablet Take 1 tablet (10 mg total) by mouth daily.   Multiple Vitamin (MULTIVITAMIN) capsule Take  1 capsule by mouth daily.   promethazine-dextromethorphan (PROMETHAZINE-DM) 6.25-15 MG/5ML syrup Half to one teaspoon at bedtime, as needed, for excessive cough   rosuvastatin (CRESTOR) 10 MG tablet TAKE (1) TABLET BY MOUTH AT BEDTIME.   TRAVATAN Z 0.004 % SOLN ophthalmic solution Place 1 drop into both eyes at bedtime.   triamterene-hydrochlorothiazide (MAXZIDE) 75-50 MG tablet TAKE 1 TABLET BY MOUTH DAILY   venlafaxine XR  (EFFEXOR-XR) 75 MG 24 hr capsule Take 1 capsule (75 mg total) by mouth daily with breakfast.   No facility-administered encounter medications on file as of 10/25/2023.    Allergies (verified) Acyclovir and related   History: Past Medical History:  Diagnosis Date   Anxiety    Arthritis    Cancer (HCC) 1993   abnormal pap [facility] at [facility]   Constipation    Depression    GERD (gastroesophageal reflux disease)    Glaucoma 1993   History of kidney stones    Hot flashes, menopausal 06/12/2011   Hyperlipidemia    Hypertension    Neck pain 7/09   WITH BULGING DISC-----RECIEVING EPIDURALS    Sinusitis    Past Surgical History:  Procedure Laterality Date   ABDOMINAL EXPLORATION SURGERY  age 76   bowel obstruction, [facility]   ABDOMINAL HYSTERECTOMY  2007   [facility], [facility]   BILATERAL EYE SURGERY     for glaucoma   BIOPSY  01/09/2019   Procedure: BIOPSY;  Surgeon: West Bali, MD;  Location: [facility];  Service: Endoscopy;;  gstric    bone removed from under tongue     BREAST BIOPSY Left    benign   BREAST CYST EXCISION Left 2004   BREAST SURGERY Left 2004   left partial mastectomy, [facility]   CATARACT EXTRACTION Right 06/04/2015   CATARACT EXTRACTION W/PHACO  05/16/2011   Procedure: CATARACT EXTRACTION PHACO AND INTRAOCULAR LENS PLACEMENT (IOC);  Surgeon: Gemma Payor;  Location: [facility] OR[facility];  Service: Ophthalmology;  Laterality: Right;   COLONOSCOPY N/A 05/17/2013   Dr. Cyndi Bender diverticulosis was noted/small internal hemorrhoids   COLONOSCOPY WITH PROPOFOL N/A 04/12/2021   Colonoscopy June 2022: small anal sphincter s/p rectal surgery, internal hemorrhoids, one 2 mm polyp in cecum (benign). 10 year surveillance.   CYST REMOVED LEFT BREAST /BENIGN  2005   left, [facility]   ESOPHAGOGASTRODUODENOSCOPY N/A 01/09/2019   normal esophagus. Mild NSAID gastritis s/p biopsy. +H.pylori. treated with amoxicillin, biaxin, Nexium.Marland Kitchen ERADICATION DOCUMENTED   EYE SURGERY  2010, 1992 approx   bilateral    POLYPECTOMY  04/12/2021   Procedure: POLYPECTOMY;  Surgeon: Lanelle Bal, DO;  Location: [facility];  Service: Endoscopy;;   REPAIR IMPERFORATE ANUS / ANORECTOPLASTY     per patient   TOTAL ABDOMINAL HYSTERECTOMY W/ BILATERAL SALPINGOOPHORECTOMY  2007   [facility], [facility]   TUBAL LIGATION  1991   Family History  Adopted: Yes  Problem Relation Age of Onset   Alcohol abuse Mother    Stomach cancer Mother 22   Lung cancer Father    Glaucoma Father    Alcohol abuse Father    Seizures Father    Breast cancer Sister 40   Breast cancer Sister 62   Heart disease Sister    Heart attack Sister    Glaucoma Son    Glaucoma Maternal Grandfather    Cancer Maternal Grandfather        poss leukemia   SIDS Brother    Hyperthyroidism Daughter    Seizures Son  as a baby   Kidney disease Maternal Grandmother    Heart disease Maternal Grandmother        pace maker   Leukemia Maternal Grandmother    Colon cancer Neg Hx    Social History   Socioeconomic History   Marital status: Significant Other    Spouse name: Not on file   Number of children: 3   Years of education: 84   Highest education level: 12th grade  Occupational History   Occupation: UNEMPLOYED 2011    Comment: disability    Employer: DISABLED  Tobacco Use   Smoking status: Former    Current packs/day: 0.00    Average packs/day: 3.0 packs/day for 2.0 years (6.0 ttl pk-yrs)    Types: Cigarettes    Start date: 05/11/1989    Quit date: 05/12/1991    Years since quitting: 32.4   Smokeless tobacco: Never  Vaping Use   Vaping status: Never Used  Substance and Sexual Activity   Alcohol use: Yes    Comment: occasionally   Drug use: Never   Sexual activity: Yes    Birth control/protection: Surgical  Other Topics Concern   Not on file  Social History Narrative   Lives with son, Viviann Spare. 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   caffien- coffee, 1 cup daily   Social Drivers of Health   Financial Resource Strain: Low Risk  (10/25/2023)    Overall Financial Resource Strain (CARDIA)    Difficulty of Paying Living Expenses: Not hard at all  Food Insecurity: No Food Insecurity (10/25/2023)   Hunger Vital Sign    Worried About Running Out of Food in the Last Year: Never true    Ran Out of Food in the Last Year: Never true  Transportation Needs: No Transportation Needs (10/25/2023)   PRAPARE - Administrator, Civil Service (Medical): No    Lack of Transportation (Non-Medical): No  Physical Activity: Insufficiently Active (10/25/2023)   Exercise Vital Sign    Days of Exercise per Week: 3 days    Minutes of Exercise per Session: 20 min  Stress: Stress Concern Present (10/25/2023)   [employer] of Occupational Health - Occupational Stress Questionnaire    Feeling of Stress : To some extent  Social Connections: Moderately Isolated (10/25/2023)   Social Connection and Isolation Panel [NHANES]    Frequency of Communication with Friends and Family: Three times a week    Frequency of Social Gatherings with Friends and Family: Never    Attends Religious Services: More than 4 times per year    Active Member of [employer] or Organizations: No    Attends Engineer, structural: Never    Marital Status: Divorced    Tobacco Counseling Counseling given: Not Answered   Clinical Intake:  Pre-visit preparation completed: Yes  Pain : No/denies pain     BMI - recorded: 23.73 Nutritional Status: BMI of 19-24  Normal Nutritional Risks: None Diabetes: No  How often do you need to have someone help you when you read instructions, pamphlets, or other written materials from your doctor or pharmacy?: 1 - Never  Interpreter Needed?: No  Information entered by ::  Sheelah Ritacco, CMA   Activities of Daily Living    10/25/2023    9:47 AM  In your present state of health, do you have any difficulty performing the following activities:  Hearing? 0  Vision? 0  Difficulty concentrating or making decisions? 0   Walking or climbing stairs? 0  Dressing or bathing? 0  Doing errands, shopping? 0  Preparing Food and eating ? N  Using the Toilet? N  In the past six months, have you accidently leaked urine? N  Do you have problems with loss of bowel control? N  Managing your Medications? N  Managing your Finances? N  Housekeeping or managing your Housekeeping? N    Patient Care Team: Kerri Perches, MD as PCP - General Mallipeddi, Orion Modest, MD as PCP - Cardiology (Cardiology) Lazaro Arms, MD as Consulting Physician (Obstetrics and Gynecology) Gelene Mink, NP (Gastroenterology) Neysa Hotter, MD as Consulting Physician (Psychiatry) Daisy Lazar, DO (Optometry) Lanelle Bal, DO as Consulting Physician (Gastroenterology)  Indicate any recent Medical Services you may have received from other than [facility] providers in the past year (date may be approximate).     Assessment:   This is a routine wellness examination for Toni Miller.  Hearing/Vision screen Hearing Screening - Comments:: Patient denies any hearing difficulties.   Vision Screening - Comments:: Wears rx glasses - up to date with routine eye exams Dr. Daisy Lazar   Goals Addressed             This Visit's Progress    Patient Stated       I want to try and eat less.        Depression Screen    10/25/2023    9:36 AM 08/23/2023    1:09 PM 06/28/2023   10:02 AM 04/21/2023   10:37 AM 12/08/2022   10:55 AM 08/30/2022    3:08 PM 07/08/2022    1:13 PM  PHQ 2/9 Scores  PHQ - 2 Score 0 1 1 4 2  0 1  PHQ- 9 Score 3 2 3 19 8       Fall Risk    10/25/2023    9:47 AM 09/12/2023    2:55 PM 08/23/2023    1:09 PM 06/28/2023   10:02 AM 04/21/2023   10:37 AM  Fall Risk   Falls in the past year? 0 0 0 0 0  Number falls in past yr: 0 0 0 0 0  Injury with Fall? 0 0 0 0 0  Risk for fall due to : No Fall Risks  No Fall Risks No Fall Risks No Fall Risks  Follow up Falls prevention discussed  Falls evaluation completed Falls  evaluation completed Falls evaluation completed    MEDICARE RISK AT HOME: Medicare Risk at Home Any stairs in or around the home?: No If so, are there any without handrails?: No Home free of loose throw rugs in walkways, pet beds, electrical cords, etc?: Yes Adequate lighting in your home to reduce risk of falls?: Yes Life alert?: No Use of a cane, walker or w/c?: No Grab bars in the bathroom?: No Shower chair or bench in shower?: No Elevated toilet seat or a handicapped toilet?: No  TIMED UP AND GO:  Was the test performed?  No    Cognitive Function:    09/07/2021   11:02 AM 10/08/2018    8:16 AM 08/28/2018    8:24 AM  MMSE - Mini Mental State Exam  Not completed: Unable to complete    Orientation to time  5 4  Orientation to Place  4 5  Registration  3 3  Attention/ Calculation  0 0  Recall  2 3  Language- name 2 objects  2 2  Language- repeat  0 1  Language- follow 3 step command  3 3  Language- read & follow  direction  1 1  Write a sentence  1 0  Copy design  0 0  Total score  21 22        10/25/2023    9:33 AM 08/30/2022    3:09 PM 09/07/2021   11:02 AM 09/02/2020    3:39 PM 08/30/2019    9:46 AM  6CIT Screen  What Year? 0 points 0 points 0 points 0 points 0 points  What month? 0 points 0 points 0 points 0 points 0 points  What time? 0 points 0 points 0 points 0 points 0 points  Count back from 20 0 points 0 points 0 points 0 points 0 points  Months in reverse 0 points 0 points 4 points 4 points 0 points  Repeat phrase 0 points 0 points 0 points 2 points 4 points  Total Score 0 points 0 points 4 points 6 points 4 points    Immunizations Immunization History  Administered Date(s) Administered   Influenza Split 07/27/2011, 07/26/2012, 08/16/2016   Influenza Whole 01/08/2008, 08/07/2008   Influenza, Seasonal, Injecte, Preservative Fre 08/23/2023   Influenza,inj,Quad PF,6+ Mos 08/21/2013, 08/11/2014, 07/28/2015, 07/27/2018, 08/07/2019, 08/17/2020,  07/06/2021, 07/08/2022   Moderna SARS-COV2 Booster Vaccination 02/22/2021   Moderna Sars-Covid-2 Vaccination 01/31/2020, 03/06/2020   Td 05/25/2004   Tdap 02/24/2015   Zoster Recombinant(Shingrix) 02/20/2018, 07/19/2021    TDAP status: Up to date  Flu Vaccine status: Up to date  Pneumococcal vaccine status: Not age appropriate for this patient.   Covid-19 vaccine status: Information provided on how to obtain vaccines.   Qualifies for Shingles Vaccine? No   Zostavax completed No   Shingrix Completed?: Yes  Screening Tests Health Maintenance  Topic Date Due   COVID-19 Vaccine (4 - 2024-25 season) 07/09/2023   MAMMOGRAM  08/01/2024   Medicare Annual Wellness (AWV)  08/22/2024   DTaP/Tdap/Td (3 - Td or Tdap) 02/23/2025   Colonoscopy  04/12/2026   INFLUENZA VACCINE  Completed   Hepatitis C Screening  Completed   HIV Screening  Completed   Zoster Vaccines- Shingrix  Completed   HPV VACCINES  Aged Out    Health Maintenance  Health Maintenance Due  Topic Date Due   COVID-19 Vaccine (4 - 2024-25 season) 07/09/2023    Colorectal cancer screening: Type of screening: Colonoscopy. Completed 04/12/2021. Repeat every 5 years  Mammogram status: Completed 08/02/2023. Repeat every year  Bone Density Screening: Not age appropriate for this patient.   Lung Cancer Screening: (Low Dose CT Chest recommended if Age 62-80 years, 20 pack-year currently smoking OR have quit w/in 15years.) does not qualify.   Lung Cancer Screening Referral: na  Additional Screening:  Hepatitis C Screening: does not qualify; Completed   Vision Screening: Recommended annual ophthalmology exams for early detection of glaucoma and other disorders of the eye. Is the patient up to date with their annual eye exam?  Yes  Who is the provider or what is the name of the office in which the patient attends annual eye exams? Daisy L[facility] If pt is not established with a provider, would they like to be  referred to a provider to establish care? No .   Dental Screening: Recommended annual dental exams for proper oral hygiene  Diabetic Foot Exam: na  Community Resource Referral / Chronic Care Management: CRR required this visit?  No   CCM required this visit?  No     Plan:     I have personally reviewed and noted the following in the  patient's chart:   Medical and social history Use of alcohol, tobacco or illicit drugs  Current medications and supplements including opioid prescriptions. Patient is not currently taking opioid prescriptions. Functional ability and status Nutritional status Physical activity Advanced directives List of other physicians Hospitalizations, surgeries, and ER visits in previous 12 months Vitals Screenings to include cognitive, depression, and falls Referrals and appointments  In addition, I have reviewed and discussed with patient certain preventive protocols, quality metrics, and best practice recommendations. A written personalized care plan for preventive services as well as general preventive health recommendations were provided to patient.     Jordan Hawks Myliah Medel, CMA   10/25/2023   After Visit Summary: (MyChart) Due to this being a telephonic visit, the after visit summary with patients personalized plan was offered to patient via MyChart   Nurse Notes: none

## 2023-10-25 NOTE — Patient Instructions (Signed)
Toni Miller , Thank you for taking time to come for your Medicare Wellness Visit. I appreciate your ongoing commitment to your health goals. Please review the following plan we discussed and let me know if I can assist you in the future.   Referrals/Orders/Follow-Ups/Clinician Recommendations:  Next Medicare Annual Wellness Visit: October 28, 2024 at 8:40 am virtual (video) visit     This is a list of the screening recommended for you and due dates:  Health Maintenance  Topic Date Due   COVID-19 Vaccine (4 - 2024-25 season) 07/09/2023   Mammogram  08/01/2024   Medicare Annual Wellness Visit  10/24/2024   DTaP/Tdap/Td vaccine (3 - Td or Tdap) 02/23/2025   Colon Cancer Screening  04/12/2026   Flu Shot  Completed   Hepatitis C Screening  Completed   HIV Screening  Completed   Zoster (Shingles) Vaccine  Completed   HPV Vaccine  Aged Out    Advanced directives: (Provided) Advance directive discussed with you today. I have provided a copy for you to complete at home and have notarized. Once this is complete, please bring a copy in to our office so we can scan it into your chart.   Next Medicare Annual Wellness Visit scheduled for next year: Yes  Preventive Care 26-18 Years Old, Female Preventive care refers to lifestyle choices and visits with your health care provider that can promote health and wellness. Preventive care visits are also called wellness exams. What can I expect for my preventive care visit? Counseling Your health care provider may ask you questions about your: Medical history, including: Past medical problems. Family medical history. Pregnancy history. Current health, including: Menstrual cycle. Method of birth control. Emotional well-being. Home life and relationship well-being. Sexual activity and sexual health. Lifestyle, including: Alcohol, nicotine or tobacco, and drug use. Access to firearms. Diet, exercise, and sleep habits. Work and work  Astronomer. Sunscreen use. Safety issues such as seatbelt and bike helmet use. Physical exam Your health care provider will check your: Height and weight. These may be used to calculate your BMI (body mass index). BMI is a measurement that tells if you are at a healthy weight. Waist circumference. This measures the distance around your waistline. This measurement also tells if you are at a healthy weight and may help predict your risk of certain diseases, such as type 2 diabetes and high blood pressure. Heart rate and blood pressure. Body temperature. Skin for abnormal spots. What immunizations do I need?  Vaccines are usually given at various ages, according to a schedule. Your health care provider will recommend vaccines for you based on your age, medical history, and lifestyle or other factors, such as travel or where you work. What tests do I need? Screening Your health care provider may recommend screening tests for certain conditions. This may include: Lipid and cholesterol levels. Diabetes screening. This is done by checking your blood sugar (glucose) after you have not eaten for a while (fasting). Pelvic exam and Pap test. Hepatitis B test. Hepatitis C test. HIV (human immunodeficiency virus) test. STI (sexually transmitted infection) testing, if you are at risk. Lung cancer screening. Colorectal cancer screening. Mammogram. Talk with your health care provider about when you should start having regular mammograms. This may depend on whether you have a family history of breast cancer. BRCA-related cancer screening. This may be done if you have a family history of breast, ovarian, tubal, or peritoneal cancers. Bone density scan. This is done to screen for osteoporosis. Talk with  your health care provider about your test results, treatment options, and if necessary, the need for more tests. Follow these instructions at home: Eating and drinking  Eat a diet that includes fresh  fruits and vegetables, whole grains, lean protein, and low-fat dairy products. Take vitamin and mineral supplements as recommended by your health care provider. Do not drink alcohol if: Your health care provider tells you not to drink. You are pregnant, may be pregnant, or are planning to become pregnant. If you drink alcohol: Limit how much you have to 0-1 drink a day. Know how much alcohol is in your drink. In the [place] one drink equals one 12 oz bottle of beer (355 mL), one 5 oz glass of wine (148 mL), or one 1 oz glass of hard liquor (44 mL). Lifestyle Brush your teeth every morning and night with fluoride toothpaste. Floss one time each day. Exercise for at least 30 minutes 5 or more days each week. Do not use any products that contain nicotine or tobacco. These products include cigarettes, chewing tobacco, and vaping devices, such as e-cigarettes. If you need help quitting, ask your health care provider. Do not use drugs. If you are sexually active, practice safe sex. Use a condom or other form of protection to prevent STIs. If you do not wish to become pregnant, use a form of birth control. If you plan to become pregnant, see your health care provider for a prepregnancy visit. Take aspirin only as told by your health care provider. Make sure that you understand how much to take and what form to take. Work with your health care provider to find out whether it is safe and beneficial for you to take aspirin daily. Find healthy ways to manage stress, such as: Meditation, yoga, or listening to music. Journaling. Talking to a trusted person. Spending time with friends and family. Minimize exposure to UV radiation to reduce your risk of skin cancer. Safety Always wear your seat belt while driving or riding in a vehicle. Do not drive: If you have been drinking alcohol. Do not ride with someone who has been drinking. When you are tired or distracted. While texting. If you have been using  any mind-altering substances or drugs. Wear a helmet and other protective equipment during sports activities. If you have firearms in your house, make sure you follow all gun safety procedures. Seek help if you have been physically or sexually abused. What's next? Visit your health care provider once a year for an annual wellness visit. Ask your health care provider how often you should have your eyes and teeth checked. Stay up to date on all vaccines. This information is not intended to replace advice given to you by your health care provider. Make sure you discuss any questions you have with your health care provider. Document Revised: 04/21/2021 Document Reviewed: 04/21/2021 Elsevier Patient Education  2024 [employer].

## 2023-10-27 ENCOUNTER — Other Ambulatory Visit: Payer: Self-pay | Admitting: Family Medicine

## 2023-11-07 ENCOUNTER — Encounter: Payer: Self-pay | Admitting: Orthopedic Surgery

## 2023-11-07 ENCOUNTER — Other Ambulatory Visit (INDEPENDENT_AMBULATORY_CARE_PROVIDER_SITE_OTHER): Payer: Self-pay

## 2023-11-07 ENCOUNTER — Ambulatory Visit (INDEPENDENT_AMBULATORY_CARE_PROVIDER_SITE_OTHER): Payer: 59 | Admitting: Orthopedic Surgery

## 2023-11-07 ENCOUNTER — Other Ambulatory Visit: Payer: Self-pay

## 2023-11-07 VITALS — BP 129/91 | HR 83 | Ht 61.0 in | Wt 120.2 lb

## 2023-11-07 DIAGNOSIS — M79651 Pain in right thigh: Secondary | ICD-10-CM | POA: Diagnosis not present

## 2023-11-07 DIAGNOSIS — M79652 Pain in left thigh: Secondary | ICD-10-CM

## 2023-11-07 DIAGNOSIS — S76311A Strain of muscle, fascia and tendon of the posterior muscle group at thigh level, right thigh, initial encounter: Secondary | ICD-10-CM | POA: Diagnosis not present

## 2023-11-07 DIAGNOSIS — S76312A Strain of muscle, fascia and tendon of the posterior muscle group at thigh level, left thigh, initial encounter: Secondary | ICD-10-CM | POA: Diagnosis not present

## 2023-11-07 DIAGNOSIS — S76319A Strain of muscle, fascia and tendon of the posterior muscle group at thigh level, unspecified thigh, initial encounter: Secondary | ICD-10-CM

## 2023-11-07 MED ORDER — ROSUVASTATIN CALCIUM 10 MG PO TABS: 10.0000 mg | ORAL_TABLET | Freq: Every day | ORAL | 2 refills | Status: DC

## 2023-11-07 NOTE — Progress Notes (Signed)
 New Patient Visit  Assessment: Toni Miller is a 62 y.o. female with the following: 1. Bilateral thigh pain 2. Hamstring muscle strain, unspecified laterality, initial encounter  Plan: KAIULANI SITTON has pain in the posterior aspect of bilateral legs.  Pain is not recreated with resisted knee flexion.  She does have pain when she attempts to touch her toes.  Pain is in the middle of the hamstring muscles.  Radiographs of the lumbar spine are without acute injury.  Minimal degenerative changes.  We discussed treating her ongoing symptoms with home exercises or physical therapy.  She would like to try some exercises at home.  Medicines as needed.  Nothing further needed at this time.  She will follow-up as needed.  Follow-up: Return if symptoms worsen or fail to improve.  Subjective:  Chief Complaint  Patient presents with   Leg Pain    Bilateral thigh pain and sticking feeling on the back side of thighs. Doesn't hurt anywhere except my thighs.    History of Present Illness: Toni Miller is a 62 y.o. female who presents for evaluation of bilateral thigh pain.  She states that she has pain in the posterior thigh, bilaterally.  No specific injury.  She first started to notice pain in both legs 4-5 months ago.  No specific injury.  She expressed some concerns about an incident in grade school for 40 years ago where she may have some splinters in the legs.  She was not sure if this would have caused her sudden onset of pain.  This does not have been effective.  No prior injuries to this area.  No pain in her back.  No numbness or tingling.  She has not noticed any weakness.   Review of Systems: No fevers or chills No numbness or tingling No chest pain No shortness of breath No bowel or bladder dysfunction No GI distress No headaches   Medical History:  Past Medical History:  Diagnosis Date   Anxiety    Arthritis    Cancer (HCC) 1993   abnormal pap treated at [facility]    Constipation    Depression    GERD (gastroesophageal reflux disease)    Glaucoma 1993   History of kidney stones    Hot flashes, menopausal 06/12/2011   Hyperlipidemia    Hypertension    Neck pain 7/09   WITH BULGING DISC-----RECIEVING EPIDURALS    Sinusitis     Past Surgical History:  Procedure Laterality Date   ABDOMINAL EXPLORATION SURGERY  age 10   bowel obstruction, [facility]   ABDOMINAL HYSTERECTOMY  2007   [facility], [facility]   BILATERAL EYE SURGERY     for glaucoma   BIOPSY  01/09/2019   Procedure: BIOPSY;  Surgeon: Harvey Margo CROME, MD;  [facility];  Service: Endoscopy;;  gstric    bone removed from under tongue     BREAST BIOPSY Left    benign   BREAST CYST EXCISION Left 2004   BREAST SURGERY Left 2004   left partial mastectomy, [facility]   CATARACT EXTRACTION Right 06/04/2015   CATARACT EXTRACTION W/PHACO  05/16/2011   Procedure: CATARACT EXTRACTION PHACO AND INTRAOCULAR LENS PLACEMENT (IOC);  Surgeon: Cherene Mania;  Location: [facility];  Service: Ophthalmology;  Laterality: Right;   COLONOSCOPY N/A 05/17/2013   Dr. Sharla diverticulosis was noted/small internal hemorrhoids   COLONOSCOPY WITH PROPOFOL  N/A 04/12/2021   Colonoscopy June 2022: small anal sphincter s/p rectal surgery, internal hemorrhoids, one 2 mm polyp in  cecum (benign). 10 year surveillance.   CYST REMOVED LEFT BREAST /BENIGN  2005   left, [facility]   ESOPHAGOGASTRODUODENOSCOPY N/A 01/09/2019   normal esophagus. Mild NSAID gastritis s/p biopsy. +H.pylori. treated with amoxicillin , biaxin , Nexium .. ERADICATION DOCUMENTED   EYE SURGERY  2010, 1992 approx   bilateral   POLYPECTOMY  04/12/2021   Procedure: POLYPECTOMY;  Surgeon: Cindie Carlin POUR, DO;  Location: [facility];  Service: Endoscopy;;   REPAIR IMPERFORATE ANUS / ANORECTOPLASTY     per patient   TOTAL ABDOMINAL HYSTERECTOMY W/ BILATERAL SALPINGOOPHORECTOMY  2007   [facility], [facility]   TUBAL LIGATION  1991    Family History  Adopted: Yes  Problem Relation  Age of Onset   Alcohol abuse Mother    Stomach cancer Mother 69   Lung cancer Father    Glaucoma Father    Alcohol abuse Father    Seizures Father    Breast cancer Sister 57   Breast cancer Sister 23   Heart disease Sister    Heart attack Sister    Glaucoma Son    Glaucoma Maternal Grandfather    Cancer Maternal Grandfather        poss leukemia   SIDS Brother    Hyperthyroidism Daughter    Seizures Son        as a development worker, international aid   Kidney disease Maternal Grandmother    Heart disease Maternal Grandmother        pace maker   Leukemia Maternal Grandmother    Colon cancer Neg Hx    Social History   Tobacco Use   Smoking status: Former    Current packs/day: 0.00    Average packs/day: 3.0 packs/day for 2.0 years (6.0 ttl pk-yrs)    Types: Cigarettes    Start date: 05/11/1989    Quit date: 05/12/1991    Years since quitting: 32.5   Smokeless tobacco: Never  Vaping Use   Vaping status: Never Used  Substance Use Topics   Alcohol use: Yes    Comment: occasionally   Drug use: Never    Allergies  Allergen Reactions   Acyclovir  And Related Rash    Current Meds  Medication Sig   benzonatate  (TESSALON ) 100 MG capsule Take 1 capsule (100 mg total) by mouth 2 (two) times daily as needed for cough.   cetirizine  (ZYRTEC ) 5 MG chewable tablet Chew 5 mg by mouth daily.   cholecalciferol (VITAMIN D3) 25 MCG (1000 UNIT) tablet Take 1,000 Units by mouth daily.   conjugated estrogens  (PREMARIN ) vaginal cream APPLY FINGERTIP AMOUNT VAGINALLY 3 TIMES WEEKLY AS NEEDED   cycloSPORINE  (RESTASIS ) 0.05 % ophthalmic emulsion Place 1 drop into both eyes 2 (two) times daily.   esomeprazole  (NEXIUM ) 40 MG capsule TAKE 1 CAPSULE BY MOUTH TWICE  DAILY   fluticasone  (FLONASE ) 50 MCG/ACT nasal spray Place 1 spray into both nostrils daily as needed for allergies or rhinitis.   gabapentin  (NEURONTIN ) 300 MG capsule TAKE 1 CAPSULE BY MOUTH TWICE  DAILY   hydrocortisone  (ANUSOL -HC) 2.5 % rectal cream Place 1  Application rectally 2 (two) times daily. Per rectum for itching and burning for 5-7 days.   montelukast  (SINGULAIR ) 10 MG tablet Take 1 tablet (10 mg total) by mouth daily.   Multiple Vitamin (MULTIVITAMIN) capsule Take 1 capsule by mouth daily.   promethazine -dextromethorphan (PROMETHAZINE -DM) 6.25-15 MG/5ML syrup Half to one teaspoon at bedtime, as needed, for excessive cough   rosuvastatin  (CRESTOR ) 10 MG tablet Take 1 tablet (10 mg total) by mouth at  bedtime.   TRAVATAN  Z 0.004 % SOLN ophthalmic solution Place 1 drop into both eyes at bedtime.   triamterene -hydrochlorothiazide (MAXZIDE) 75-50 MG tablet TAKE 1 TABLET BY MOUTH DAILY   venlafaxine  XR (EFFEXOR -XR) 75 MG 24 hr capsule Take 1 capsule (75 mg total) by mouth daily with breakfast.    Objective: BP (!) 129/91   Pulse 83   Ht 5' 1 (1.549 m)   Wt 120 lb 4 oz (54.5 kg)   BMI 22.72 kg/m   Physical Exam:  General: Alert and oriented. and No acute distress. Gait: Normal gait.  Evaluation of the lower back demonstrates no deformity.  No bruising.  No swelling.  No tenderness.  She has some mild discomfort to palpation in the mid aspect of bilateral hamstring muscles.  Pain is not recreated with resisted knee flexion.  Negative straight leg raise bilaterally.  She does have some pain in the posterior aspect of both legs with resisted knee extension.  Toes are warm and well-perfused.  Sensation intact throughout bilateral lower extremities.  2+ patellar tendon reflexes.  She has pain in the hamstring muscles when she attempts to touch her toes.  IMAGING: I personally ordered and reviewed the following images  X-rays of the lumbar spine were obtained in clinic today.  No acute injuries.  Well-maintained disc height.  No evidence of anterolisthesis.  No scoliosis.  No bony lesions.  Impression: Negative lumbar spine x-rays   New Medications:  No orders of the defined types were placed in this encounter.     Oneil DELENA Horde,  MD  11/07/2023 10:57 AM

## 2023-11-07 NOTE — Progress Notes (Deleted)
 New Patient Visit  Assessment: Toni Miller is a 62 y.o. female with the following: 1. Bilateral thigh pain ***  2. Hamstring muscle strain, unspecified laterality, initial encounter ***   Plan: [name]   Follow-up: No follow-ups on file.  Subjective:  Chief Complaint  Patient presents with   Leg Pain    Bilateral thigh pain and sticking feeling on the back side of thighs. Doesn't hurt anywhere except my thighs.    History of Present Illness: Toni Miller is a 62 y.o. female who {Presentation:27320} for evaluation of    Review of Systems: No fevers or chills*** No numbness or tingling No chest pain No shortness of breath No bowel or bladder dysfunction No GI distress No headaches   Medical History:  Past Medical History:  Diagnosis Date   Anxiety    Arthritis    Cancer (HCC) 1993   abnormal pap treated at [facility]   Constipation    Depression    GERD (gastroesophageal reflux disease)    Glaucoma 1993   History of kidney stones    Hot flashes, menopausal 06/12/2011   Hyperlipidemia    Hypertension    Neck pain 7/09   WITH BULGING DISC-----RECIEVING EPIDURALS    Sinusitis     Past Surgical History:  Procedure Laterality Date   ABDOMINAL EXPLORATION SURGERY  age 21   bowel obstruction, [facility]   ABDOMINAL HYSTERECTOMY  2007   [facility], [facility]   BILATERAL EYE SURGERY     for glaucoma   BIOPSY  01/09/2019   Procedure: BIOPSY;  Surgeon: Harvey Margo CROME, MD;  [facility];  Service: Endoscopy;;  gstric    bone removed from under tongue     BREAST BIOPSY Left    benign   BREAST CYST EXCISION Left 2004   BREAST SURGERY Left 2004   left partial mastectomy, [facility]   CATARACT EXTRACTION Right 06/04/2015   CATARACT EXTRACTION W/PHACO  05/16/2011   Procedure: CATARACT EXTRACTION PHACO AND INTRAOCULAR LENS PLACEMENT (IOC);  Surgeon: Cherene Mania;  Location: [facility];  Service: Ophthalmology;  Laterality: Right;   COLONOSCOPY N/A 05/17/2013   Dr.  Sharla diverticulosis was noted/small internal hemorrhoids   COLONOSCOPY WITH PROPOFOL  N/A 04/12/2021   Colonoscopy June 2022: small anal sphincter s/p rectal surgery, internal hemorrhoids, one 2 mm polyp in cecum (benign). 10 year surveillance.   CYST REMOVED LEFT BREAST /BENIGN  2005   left, [facility]   ESOPHAGOGASTRODUODENOSCOPY N/A 01/09/2019   normal esophagus. Mild NSAID gastritis s/p biopsy. +H.pylori. treated with amoxicillin , biaxin , Nexium .. ERADICATION DOCUMENTED   EYE SURGERY  2010, 1992 approx   bilateral   POLYPECTOMY  04/12/2021   Procedure: POLYPECTOMY;  Surgeon: Cindie Carlin POUR, DO;  [facility];  Service: Endoscopy;;   REPAIR IMPERFORATE ANUS / ANORECTOPLASTY     per patient   TOTAL ABDOMINAL HYSTERECTOMY W/ BILATERAL SALPINGOOPHORECTOMY  2007   [facility], [facility]   TUBAL LIGATION  1991    Family History  Adopted: Yes  Problem Relation Age of Onset   Alcohol abuse Mother    Stomach cancer Mother 56   Lung cancer Father    Glaucoma Father    Alcohol abuse Father    Seizures Father    Breast cancer Sister 29   Breast cancer Sister 26   Heart disease Sister    Heart attack Sister    Glaucoma Son    Glaucoma Maternal Grandfather    Cancer Maternal Grandfather  poss leukemia   SIDS Brother    Hyperthyroidism Daughter    Seizures Son        as a baby   Kidney disease Maternal Grandmother    Heart disease Maternal Grandmother        pace maker   Leukemia Maternal Grandmother    Colon cancer Neg Hx    Social History   Tobacco Use   Smoking status: Former    Current packs/day: 0.00    Average packs/day: 3.0 packs/day for 2.0 years (6.0 ttl pk-yrs)    Types: Cigarettes    Start date: 05/11/1989    Quit date: 05/12/1991    Years since quitting: 32.5   Smokeless tobacco: Never  Vaping Use   Vaping status: Never Used  Substance Use Topics   Alcohol use: Yes    Comment: occasionally   Drug use: Never    Allergies  Allergen Reactions    Acyclovir  And Related Rash    Current Meds  Medication Sig   benzonatate  (TESSALON ) 100 MG capsule Take 1 capsule (100 mg total) by mouth 2 (two) times daily as needed for cough.   cetirizine  (ZYRTEC ) 5 MG chewable tablet Chew 5 mg by mouth daily.   cholecalciferol (VITAMIN D3) 25 MCG (1000 UNIT) tablet Take 1,000 Units by mouth daily.   conjugated estrogens  (PREMARIN ) vaginal cream APPLY FINGERTIP AMOUNT VAGINALLY 3 TIMES WEEKLY AS NEEDED   cycloSPORINE  (RESTASIS ) 0.05 % ophthalmic emulsion Place 1 drop into both eyes 2 (two) times daily.   esomeprazole  (NEXIUM ) 40 MG capsule TAKE 1 CAPSULE BY MOUTH TWICE  DAILY   fluticasone  (FLONASE ) 50 MCG/ACT nasal spray Place 1 spray into both nostrils daily as needed for allergies or rhinitis.   gabapentin  (NEURONTIN ) 300 MG capsule TAKE 1 CAPSULE BY MOUTH TWICE  DAILY   hydrocortisone  (ANUSOL -HC) 2.5 % rectal cream Place 1 Application rectally 2 (two) times daily. Per rectum for itching and burning for 5-7 days.   montelukast  (SINGULAIR ) 10 MG tablet Take 1 tablet (10 mg total) by mouth daily.   Multiple Vitamin (MULTIVITAMIN) capsule Take 1 capsule by mouth daily.   promethazine -dextromethorphan (PROMETHAZINE -DM) 6.25-15 MG/5ML syrup Half to one teaspoon at bedtime, as needed, for excessive cough   rosuvastatin  (CRESTOR ) 10 MG tablet Take 1 tablet (10 mg total) by mouth at bedtime.   TRAVATAN  Z 0.004 % SOLN ophthalmic solution Place 1 drop into both eyes at bedtime.   triamterene -hydrochlorothiazide (MAXZIDE) 75-50 MG tablet TAKE 1 TABLET BY MOUTH DAILY   venlafaxine  XR (EFFEXOR -XR) 75 MG 24 hr capsule Take 1 capsule (75 mg total) by mouth daily with breakfast.    Objective: BP (!) 129/91   Pulse 83   Ht 5' 1 (1.549 m)   Wt 120 lb 4 oz (54.5 kg)   BMI 22.72 kg/m   Physical Exam:  General: {General PE Findings:25791} Gait: {Gait:25792}    IMAGING: {XR Reviewed:24899}   New Medications:  No orders of the defined types were placed in  this encounter.     Oneil DELENA Horde, MD  11/07/2023 9:47 AM

## 2023-11-07 NOTE — Patient Instructions (Signed)

## 2023-11-17 ENCOUNTER — Telehealth: Payer: 59 | Admitting: Family Medicine

## 2023-11-17 ENCOUNTER — Ambulatory Visit: Payer: Self-pay | Admitting: Family Medicine

## 2023-11-17 DIAGNOSIS — S299XXA Unspecified injury of thorax, initial encounter: Secondary | ICD-10-CM | POA: Diagnosis not present

## 2023-11-17 DIAGNOSIS — N644 Mastodynia: Secondary | ICD-10-CM

## 2023-11-17 NOTE — Telephone Encounter (Signed)
 Copied from CRM [id number]. Topic: Clinical - Red Word Triage >> Nov 17, 2023  2:59 PM Curlee DEL wrote: Red Word that prompted transfer to Nurse Triage: Patient has been experiencing some left breast pain for about a week now, patient also mentioned some swelling - she has been applying an ice pack.  Chief Complaint: left breast pain Symptoms: 9/10 Pain, no drainage, swelling, breast feels warm under bottom Frequency: 1 week Pertinent Negatives: Patient denies drainage  Disposition: [] ED /[] Urgent Care (no appt availability in office) / [x] Appointment(In office/virtual)/ []  [facility]/ [] Home Care/ [] Refused Recommended Disposition /[] [facility]/ []  Follow-up with PCP Additional Notes: pt has not taken anything for pain. Has only applied ice packs  Reason for Disposition  [1] Breast looks infected (spreading redness, feels hot or painful to touch) AND [2] no fever  Answer Assessment - Initial Assessment Questions 1. SYMPTOM: What's the main symptom you're concerned about?  (e.g., lump, pain, rash, nipple discharge)    Swelling, feels warm, and pain 2. LOCATION: Where is the - located?     Left breast  3. ONSET: When did  start?     X 1 week 4. PRIOR HISTORY: Do you have any history of prior problems with your breasts? (e.g., lumps, cancer, fibrocystic breast disease)     2005 breast cyst and pain - had it removal 5. CAUSE: What do you think is causing this symptom?     Possible could be related to lifting something heavy 6. OTHER SYMPTOMS: Do you have any other symptoms? (e.g., fever, breast pain, redness or rash, nipple discharge)     9/10 Pain, no drainage, swelling, breast feels warm under bottom 7. PREGNANCY-BREASTFEEDING: Is there any chance you are pregnant? When was your last menstrual period? Are you breastfeeding?     N/a  Protocols used: Breast Symptoms-A-AH

## 2023-11-17 NOTE — Patient Instructions (Signed)
 Breast Tenderness Breast tenderness is a common problem for women of all ages, but may also occur in men. Breast tenderness has many possible causes, including hormone changes, infections, taking certain medicines, and caffeine intake. In women, the pain usually comes and goes with the menstrual cycle, but it can also be constant. Breast tenderness may range from mild discomfort to severe pain. You may have tests, such as a mammogram or an ultrasound, to check for any unusual findings. Having breast tenderness usually does not mean that you have breast cancer. Follow these instructions at home: Managing pain and discomfort  If directed, put ice on the painful area. To do this: Put ice in a plastic bag. Place a towel between your skin and the bag. Leave the ice on for 20 minutes, 2-3 times a day. If your skin turns bright red, remove the ice right away to prevent skin damage. The risk of skin damage is higher if you cannot feel pain, heat, or cold. Wear a supportive bra or chest support: During exercise. While sleeping, if your breasts are very tender. Medicines Take over-the-counter and prescription medicines only as told by your health care provider. If the cause of your pain is an infection, you may be prescribed an antibiotic medicine. If you were prescribed antibiotics, take them as told by your health care provider. Do not stop using the antibiotic even if you start to feel better. Eating and drinking Decrease the amount of caffeine in your diet. Instead, drink more water and choose caffeine-free drinks. Your health care provider may recommend that you lessen the amount of fat in your diet. You can do this by: Limiting fried foods. Cooking foods using methods such as baking, boiling, grilling, and broiling. General instructions  Keep a log of the days and times when your breasts are most tender. Ask your health care provider how to do breast exams at home. This will help you notice if  you have an unusual growth or lump. Keep all follow-up visits. Contact a health care provider if: Any part of your breast is hard, red, and hot to the touch. This may be a sign of infection. You are a woman and have a new or painful lump in your breast that remains after your menstrual period ends. You are not breastfeeding and you have fluid, especially blood or pus, coming out of your nipples. You have a fever. Your pain does not improve or it gets worse. Your pain is interfering with your daily activities. Summary Breast tenderness may range from mild discomfort to severe pain. Breast tenderness has many possible causes, including hormone changes, infections, taking certain medicines, and caffeine intake. It can be treated with ice, wearing a supportive bra or chest support, and medicines. Make changes to your diet as told by your health care provider. This information is not intended to replace advice given to you by your health care provider. Make sure you discuss any questions you have with your health care provider. Document Revised: 01/05/2022 Document Reviewed: 01/05/2022 Elsevier Patient Education  2024 [employer].

## 2023-11-17 NOTE — Progress Notes (Signed)
 Virtual Visit Consent   MYANA SCHLUP, you are scheduled for a virtual visit with a [facility] provider today. Just as with appointments in the office, your consent must be obtained to participate. Your consent will be active for this visit and any virtual visit you may have with one of our providers in the next 365 days. If you have a MyChart account, a copy of this consent can be sent to you electronically.  As this is a virtual visit, video technology does not allow for your provider to perform a traditional examination. This may limit your provider's ability to fully assess your condition. If your provider identifies any concerns that need to be evaluated in person or the need to arrange testing (such as labs, EKG, etc.), we will make arrangements to do so. Although advances in technology are sophisticated, we cannot ensure that it will always work on either your end or our end. If the connection with a video visit is poor, the visit may have to be switched to a telephone visit. With either a video or telephone visit, we are not always able to ensure that we have a secure connection.  By engaging in this virtual visit, you consent to the provision of healthcare and authorize for your insurance to be billed (if applicable) for the services provided during this visit. Depending on your insurance coverage, you may receive a charge related to this service.  I need to obtain your verbal consent now. Are you willing to proceed with your visit today? Toni Miller has provided verbal consent on 11/17/2023 for a virtual visit (video or telephone). Loa Lamp, FNP  Date: 11/17/2023 5:22 PM  Virtual Visit via Video Note   I, Loa Lamp, connected with  Toni Miller  ([id number], 09-28-1961) on 11/17/23 at  5:15 PM EST by a video-enabled telemedicine application and verified that I am speaking with the correct person using two identifiers.  Location: Patient: Virtual Visit Location Patient:  Home Provider: Virtual Visit Location Provider: Home Office   I discussed the limitations of evaluation and management by telemedicine and the availability of in person appointments. The patient expressed understanding and agreed to proceed.    History of Present Illness: Toni Miller is a 63 y.o. who identifies as a female who was assigned female at birth, and is being seen today for left breast pain, tender to touch, she has a heating pad on it, no bruising, redness or heat. She remembers hitting it at the grocery store last month. Mammogram she reports was in October and normal. Denies chest pain, sob, dizziness and nausea.   HPI: HPI  Problems:  Patient Active Problem List   Diagnosis Date Noted   Acute pharyngitis 09/12/2023   Cough 09/12/2023   Encounter for Medicare annual examination with abnormal findings 08/23/2023   Immunization due 08/23/2023   Hip pain, acute, left 08/23/2023   Dysuria 06/30/2023   Bloating 06/08/2023   Cellulitis and abscess of buttock 04/26/2023   Hot flashes due to menopause 04/26/2023   Great toe pain, unspecified laterality 04/21/2023   Positive self-administered antigen test for COVID-19 12/23/2022   Syncope and collapse 12/09/2022   Encounter for annual general medical examination with abnormal findings in adult 07/10/2022   Right knee pain 09/07/2021   Headache, variant migraine 07/07/2021   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   Menopausal vaginal dryness 10/09/2019   Knee pain, left 04/26/2019   Left hip pain 04/26/2019   Dyspepsia 12/12/2018   FH: breast cancer in first degree relative when <20 years old 06/06/2018   Neck pain 04/21/2016   Arthritis of both knees 04/25/2015   Osteopenia 04/02/2014   Depression, major,  recurrent, in partial remission (HCC) 01/06/2014   H/O abnormal Pap smear 04/20/2013   Dermatitis 12/19/2011   Acute laryngitis 08/16/2011   Thyroid  nodule 08/16/2011   Right thigh pain 06/01/2011   Chronic constipation 06/01/2011   Low back pain with left-sided sciatica 12/29/2009   Allergic rhinitis 05/14/2008   Hyperlipidemia with target LDL less than 100 01/29/2008   GLAUCOMA 01/29/2008   Hypertension 01/29/2008   GERD 01/29/2008    Allergies:  Allergies  Allergen Reactions   Acyclovir  And Related Rash   Medications:  Current Outpatient Medications:    benzonatate  (TESSALON ) 100 MG capsule, Take 1 capsule (100 mg total) by mouth 2 (two) times daily as needed for cough., Disp: 20 capsule, Rfl: 0   cetirizine  (ZYRTEC ) 5 MG chewable tablet, Chew 5 mg by mouth daily., Disp: , Rfl:    cholecalciferol (VITAMIN D3) 25 MCG (1000 UNIT) tablet, Take 1,000 Units by mouth daily., Disp: , Rfl:    conjugated estrogens  (PREMARIN ) vaginal cream, APPLY FINGERTIP AMOUNT VAGINALLY 3 TIMES WEEKLY AS NEEDED, Disp: 30 g, Rfl: 2   cycloSPORINE  (RESTASIS ) 0.05 % ophthalmic emulsion, Place 1 drop into both eyes 2 (two) times daily., Disp: , Rfl:    esomeprazole  (NEXIUM ) 40 MG capsule, TAKE 1 CAPSULE BY MOUTH TWICE  DAILY, Disp: 60 capsule, Rfl: 11   fluticasone  (FLONASE ) 50 MCG/ACT nasal spray, Place 1 spray into both nostrils daily as needed for allergies or rhinitis., Disp: 16 mL, Rfl: 2   gabapentin  (NEURONTIN ) 300 MG capsule, TAKE 1 CAPSULE BY MOUTH TWICE  DAILY, Disp: 60 capsule, Rfl: 11   hydrocortisone  (ANUSOL -HC) 2.5 % rectal cream, Place 1 Application rectally 2 (two) times daily. Per rectum for itching and burning for 5-7 days., Disp: 30 g, Rfl: 1   montelukast  (SINGULAIR ) 10 MG tablet, Take 1 tablet (10 mg total) by mouth daily., Disp: 90 tablet, Rfl: 1   Multiple Vitamin (MULTIVITAMIN) capsule, Take 1 capsule by mouth daily., Disp: , Rfl:    promethazine -dextromethorphan (PROMETHAZINE -DM)  6.25-15 MG/5ML syrup, Half to one teaspoon at bedtime, as needed, for excessive cough, Disp: 1178 mL, Rfl: 0   rosuvastatin  (CRESTOR ) 10 MG tablet, Take 1 tablet (10 mg total) by mouth at bedtime., Disp: 100 tablet, Rfl: 2   TRAVATAN  Z 0.004 % SOLN ophthalmic solution, Place 1 drop into both eyes at bedtime., Disp: , Rfl:    triamterene -hydrochlorothiazide (MAXZIDE) 75-50 MG tablet, TAKE 1 TABLET BY MOUTH DAILY, Disp: 100 tablet, Rfl: 2   venlafaxine  XR (EFFEXOR -XR) 75 MG 24 hr capsule, Take 1 capsule (75 mg total) by mouth daily with breakfast., Disp: 30 capsule, Rfl: 2  Observations/Objective: Patient is well-developed, well-nourished in no acute distress.  Resting comfortably  at home.  Head is normocephalic, atraumatic.  No labored breathing.  Speech is clear and coherent with logical content.  Patient is alert and oriented at baseline.    Assessment and Plan: 1. Breast pain, left (Primary)  2. Injury of left breast, initial encounter  Continue heat, go to be seen at [facility] when weather permits. She does not appear to be in any distress.   Follow  Up Instructions: I discussed the assessment and treatment plan with the patient. The patient was provided an opportunity to ask questions and all were answered. The patient agreed with the plan and demonstrated an understanding of the instructions.  A copy of instructions were sent to the patient via MyChart unless otherwise noted below.     The patient was advised to call back or seek an in-person evaluation if the symptoms worsen or if the condition fails to improve as anticipated.    Kedar Sedano, FNP

## 2023-11-20 NOTE — Telephone Encounter (Signed)
 LVM

## 2023-11-21 ENCOUNTER — Ambulatory Visit: Payer: Self-pay | Admitting: Family Medicine

## 2023-11-21 NOTE — Telephone Encounter (Signed)
 Copied from CRM [id number]. Topic: Clinical - Red Word Triage >> Nov 21, 2023  9:27 AM Susanna ORN wrote: Red Word that prompted transfer to Nurse Triage: Patient states her left breast is swollen & hot underneath. States this has been ongoing for over a week. Also states her fingers on her left hand gets numb at night.   Chief Complaint: L breast swelling and L hand numbness at night Frequency: Ongoing for 1-2 weeks Pertinent Negatives: Patient denies tingling or weakness Disposition: [] ED /[] Urgent Care (no appt availability in office) / [x] Appointment(In office/virtual)/ []  [place] [facility]/ [] Home Care/ [] Refused Recommended Disposition /[] [facility]/ []  Follow-up with PCP  Additional Notes: Patient reported that she is having left breast swelling and she has numbness in left hand. Both symptoms started 1-2 weeks ago. Patient believes the swelling was caused by lifting a heavy gallon of water . The hand numbness occurs at night. Next available appt scheduled on 1/16.     Reason for Disposition  Change in shape or appearance of breast  Answer Assessment - Initial Assessment Questions 1. SYMPTOM: What's the main symptom you're concerned about?  (e.g., lump, pain, rash, nipple discharge)     Left breast swollen  2. LOCATION: Where is the swelling located?     L breast  3. ONSET: When did it  start?     1-2 weeks ago  4. PRIOR HISTORY: Do you have any history of prior problems with your breasts? (e.g., lumps, cancer, fibrocystic breast disease)     Hx of cyst in the past  5. CAUSE: What do you think is causing this symptom?     Patient lifting a heavy gallon of water  and she had swelling in her breast day same day  6. OTHER SYMPTOMS: Do you have any other symptoms? (e.g., fever, breast pain, redness or rash, nipple discharge)     Sometimes has breast pain, warmth underneath breast  Answer Assessment - Initial Assessment Questions 1. SYMPTOM: What is the  main symptom you are concerned about? (e.g., weakness, numbness)     Numbness in L hand at night  2. ONSET: When did this start? (minutes, hours, days; while sleeping)     1-2 weeks ago  3. PATTERN Does this come and go, or has it been constant since it started?  Is it present now?     Comes and goes  6. NEUROLOGIC SYMPTOMS: Have you had any of the following symptoms: headache, dizziness, vision loss, double vision, changes in speech, unsteady on your feet?     No  Protocols used: Breast Symptoms-A-AH, Neurologic Deficit-A-AH

## 2023-11-23 ENCOUNTER — Encounter: Payer: Self-pay | Admitting: Family Medicine

## 2023-11-23 ENCOUNTER — Ambulatory Visit (INDEPENDENT_AMBULATORY_CARE_PROVIDER_SITE_OTHER): Payer: 59 | Admitting: Family Medicine

## 2023-11-23 VITALS — BP 140/93 | HR 94 | Ht 61.0 in | Wt 121.0 lb

## 2023-11-23 DIAGNOSIS — N644 Mastodynia: Secondary | ICD-10-CM | POA: Insufficient documentation

## 2023-11-23 DIAGNOSIS — D509 Iron deficiency anemia, unspecified: Secondary | ICD-10-CM | POA: Diagnosis not present

## 2023-11-23 NOTE — Patient Instructions (Signed)

## 2023-11-23 NOTE — Assessment & Plan Note (Signed)
Left Diagnostic Mammogram ordered for further evaluation  Advise to  wear a well-fitting, supportive bra to reduce discomfort and avoid activities that may worsen the pain. Apply warm or cold compresses as needed and consider over-the-counter pain relievers, such acetaminophen,  Reducing caffeine and maintaining a healthy diet may also help alleviate symptoms.

## 2023-11-23 NOTE — Progress Notes (Signed)
Established Patient Office Visit   Subjective  Patient ID: Toni Miller, female    DOB: 03/04/1961  Age: 63 y.o. MRN: [id number]  Chief Complaint  Patient presents with   Acute Visit    L breast swollen with pain under arm. and L hand numbness and tingling for 1-2 week had mammogram in Oct. Of last year. With no abnormal findings.     She  has a past medical history of Anxiety, Arthritis, Cancer (HCC) (1993), Constipation, Depression, GERD (gastroesophageal reflux disease), Glaucoma (1993), History of kidney stones, Hot flashes, menopausal (06/12/2011), Hyperlipidemia, Hypertension, Neck pain (7/09), and Sinusitis.  Patient reports left breast pain that began two weeks ago. The pain is described as aching and sharp, with a severity of 8/10 and is constant. She notes that the pain is localized to the left breast, with no associated lump or nipple discharge. The left breast is swollen and tender to touch, but there is no redness. The pain worsens when lying on the left side at night and is alleviated by lying on the right side. There are no changes in the nipple, such as discharge, inversion, or crusting, and no skin changes like redness, dimpling, or rash. The patient has experienced fatigue and night sweats but denies fever or weight loss. She reports swelling on the right side of the breast, contributing to a feeling of asymmetry. She has no prior history of breast issues or procedures, and her recent mammogram in October was normal. The patient does have a family history of breast cancer, with two sisters diagnosed with the condition.    Review of Systems  Constitutional:  Negative for chills and fever.  Respiratory:  Negative for shortness of breath.   Cardiovascular:  Negative for chest pain.  Neurological:  Negative for dizziness and headaches.      Objective:     BP (!) 140/93   Pulse 94   Ht 5\' 1"  (1.549 m)   Wt 121 lb 0.6 oz (54.9 kg)   SpO2 95%   BMI 22.87 kg/m  BP  Readings from Last 3 Encounters:  11/23/23 (!) 140/93  11/07/23 (!) 129/91  09/12/23 128/82      Physical Exam Vitals reviewed.  Constitutional:      General: She is not in acute distress.    Appearance: Normal appearance. She is not ill-appearing, toxic-appearing or diaphoretic.  HENT:     Head: Normocephalic.  Eyes:     General:        Right eye: No discharge.        Left eye: No discharge.     Conjunctiva/sclera: Conjunctivae normal.  Cardiovascular:     Rate and Rhythm: Normal rate.     Pulses: Normal pulses.     Heart sounds: Normal heart sounds.  Pulmonary:     Effort: Pulmonary effort is normal. No respiratory distress.     Breath sounds: Normal breath sounds.  Chest:  Breasts:    Right: No swelling, bleeding, mass, nipple discharge, skin change or tenderness.     Left: Swelling and tenderness present. No bleeding, mass, nipple discharge or skin change.  Abdominal:     Tenderness: There is no left CVA tenderness.  Skin:    General: Skin is warm and dry.     Capillary Refill: Capillary refill takes less than 2 seconds.  Neurological:     Mental Status: She is alert.     Coordination: Coordination normal.     Gait: Gait normal.  Psychiatric:        Mood and Affect: Mood normal.        Behavior: Behavior normal.      No results found for any visits on 11/23/23.  The 10-year ASCVD risk score (Arnett DK, et al., 2019) is: 7%    Assessment & Plan:  Iron deficiency anemia, unspecified iron deficiency anemia type -     Vitamin B12 -     Iron, TIBC and Ferritin Panel  Pain of left breast -     MM 3D DIAGNOSTIC MAMMOGRAM UNILATERAL LEFT BREAST; Future  Breast pain, left Assessment & Plan: Left Diagnostic Mammogram ordered for further evaluation  Advise to  wear a well-fitting, supportive bra to reduce discomfort and avoid activities that may worsen the pain. Apply warm or cold compresses as needed and consider over-the-counter pain relievers, such  acetaminophen,  Reducing caffeine and maintaining a healthy diet may also help alleviate symptoms.     Return if symptoms worsen or fail to improve.   Cruzita Lederer Newman Nip, FNP

## 2023-11-24 LAB — IRON,TIBC AND FERRITIN PANEL
Ferritin: 96 ng/mL (ref 15–150)
Iron Saturation: 30 % (ref 15–55)
Iron: 79 ug/dL (ref 27–159)
Total Iron Binding Capacity: 263 ug/dL (ref 250–450)
UIBC: 184 ug/dL (ref 131–425)

## 2023-11-24 LAB — VITAMIN B12: Vitamin B-12: 1783 pg/mL — ABNORMAL HIGH (ref 232–1245)

## 2023-11-27 ENCOUNTER — Other Ambulatory Visit (HOSPITAL_COMMUNITY): Payer: Self-pay | Admitting: Family Medicine

## 2023-11-27 DIAGNOSIS — N644 Mastodynia: Secondary | ICD-10-CM

## 2023-11-28 ENCOUNTER — Telehealth: Payer: Self-pay | Admitting: Psychiatry

## 2023-11-28 NOTE — Telephone Encounter (Signed)
Please advise her that those side effects are not common and it is unlikely that venlafaxine is causing them, as she has been on the same dose for a while without experiencing these issues. Recommend that she reach out to her primary care provider for further evaluation and guidance.

## 2023-11-28 NOTE — Telephone Encounter (Signed)
Patient calling stating her fingers are tingling at night and her left breast is swollen. She is wondering if the anti depressant is causing this. Please advise

## 2023-11-28 NOTE — Telephone Encounter (Signed)
Called patient as instructed by provider to advise her that those side effects are not common and it is unlikely that venlafaxine is causing them, as she has been on the same dose for a while without experiencing these issues. Recommend that she reach out to her primary care provider for further evaluation and guidance patient voiced understanding

## 2023-12-03 ENCOUNTER — Emergency Department (HOSPITAL_COMMUNITY)
Admission: EM | Admit: 2023-12-03 | Discharge: 2023-12-03 | Disposition: A | Discharge status: Home / Self Care | Payer: 59

## 2023-12-03 ENCOUNTER — Emergency Department (HOSPITAL_COMMUNITY): Payer: 59

## 2023-12-03 ENCOUNTER — Other Ambulatory Visit: Payer: Self-pay

## 2023-12-03 ENCOUNTER — Encounter (HOSPITAL_COMMUNITY): Payer: Self-pay

## 2023-12-03 DIAGNOSIS — Z87891 Personal history of nicotine dependence: Secondary | ICD-10-CM | POA: Diagnosis not present

## 2023-12-03 DIAGNOSIS — G8929 Other chronic pain: Secondary | ICD-10-CM | POA: Insufficient documentation

## 2023-12-03 DIAGNOSIS — N644 Mastodynia: Secondary | ICD-10-CM | POA: Insufficient documentation

## 2023-12-03 DIAGNOSIS — M25512 Pain in left shoulder: Secondary | ICD-10-CM | POA: Insufficient documentation

## 2023-12-03 MED ORDER — METHOCARBAMOL 500 MG PO TABS
500.0000 mg | ORAL_TABLET | Freq: Once | ORAL | Status: AC
  Administered 2023-12-03: 500 mg via ORAL
  Filled 2023-12-03: qty 1

## 2023-12-03 MED ORDER — IBUPROFEN 400 MG PO TABS
400.0000 mg | ORAL_TABLET | Freq: Once | ORAL | Status: AC
Start: 2023-12-03 — End: 2023-12-03
  Administered 2023-12-03: 400 mg via ORAL
  Filled 2023-12-03: qty 1

## 2023-12-03 MED ORDER — ACETAMINOPHEN 500 MG PO TABS
1000.0000 mg | ORAL_TABLET | Freq: Once | ORAL | Status: AC
Start: 2023-12-03 — End: 2023-12-03
  Administered 2023-12-03: 1000 mg via ORAL
  Filled 2023-12-03: qty 2

## 2023-12-03 MED ORDER — TRAMADOL HCL 50 MG PO TABS: 50.0000 mg | ORAL_TABLET | Freq: Four times a day (QID) | ORAL | 0 refills | Status: DC | PRN

## 2023-12-03 MED ORDER — TRAMADOL HCL 50 MG PO TABS
50.0000 mg | ORAL_TABLET | Freq: Once | ORAL | Status: AC
  Administered 2023-12-03: 50 mg via ORAL
  Filled 2023-12-03: qty 1

## 2023-12-03 MED ORDER — LIDOCAINE 4 % EX PTCH: 1.0000 | MEDICATED_PATCH | CUTANEOUS | 0 refills | Status: AC

## 2023-12-03 MED ORDER — LIDOCAINE 5 % EX PTCH
1.0000 | MEDICATED_PATCH | CUTANEOUS | Status: DC
  Administered 2023-12-03: 1 via TRANSDERMAL
  Filled 2023-12-03: qty 1

## 2023-12-03 NOTE — Discharge Instructions (Addendum)
As discussed follow-up with your primary doctor for further evaluation of your breast pain with mammogram.  You may take over-the-counter Tylenol alternate with ibuprofen.  We have prescribed you some pain medication for breakthrough pain.  Do not drive or operate heavy machinery while taking.

## 2023-12-03 NOTE — ED Triage Notes (Signed)
Pt complains of left breast pain with visible swelling. Pt states pain is radiating to back. Swelling started about a month ago.

## 2023-12-03 NOTE — ED Provider Notes (Signed)
[facility] EMERGENCY DEPARTMENT AT [facility] Provider Note   CSN: [id number] Arrival date & time: 12/03/23  4098     History  Chief Complaint  Patient presents with   Breast Pain    Toni Miller is a 63 y.o. female.  This is a 63 year old female presenting emergency department for breast pain.  Pain is left-sided.  Ongoing for the past month and a half and seemingly worsening.  She also has some chronic left shoulder pain with intermittent tingling in her hands.  None currently.  Has been told she has a pinched nerve in her neck.  Denies any fevers, no chills, unintentional weight loss, fatigue.  Has a remote history of smoking.  Sister has had breast cancer.  No mass that she can feel.         Home Medications Prior to Admission medications   Medication Sig Start Date End Date Taking? Authorizing Provider  lidocaine 4 % Place 1 patch onto the skin daily for 10 days. 12/03/23 12/13/23 Yes Keilyn Nadal, Harmon Dun, DO  traMADol (ULTRAM) 50 MG tablet Take 1 tablet (50 mg total) by mouth every 6 (six) hours as needed. 12/03/23  Yes Henrick Mcgue, Harmon Dun, DO  benzonatate (TESSALON) 100 MG capsule Take 1 capsule (100 mg total) by mouth 2 (two) times daily as needed for cough. 09/12/23   Kerri Perches, MD  cetirizine (ZYRTEC) 5 MG chewable tablet Chew 5 mg by mouth daily.    [provider]  cholecalciferol (VITAMIN D3) 25 MCG (1000 UNIT) tablet Take 1,000 Units by mouth daily.    [provider]  conjugated estrogens (PREMARIN) vaginal cream APPLY FINGERTIP AMOUNT VAGINALLY 3 TIMES WEEKLY AS NEEDED 07/04/23   Kerri Perches, MD  cycloSPORINE (RESTASIS) 0.05 % ophthalmic emulsion Place 1 drop into both eyes 2 (two) times daily.    [provider]  esomeprazole (NEXIUM) 40 MG capsule TAKE 1 CAPSULE BY MOUTH TWICE  DAILY 04/12/23   Gelene Mink, NP  fluticasone [facility]) 50 MCG/ACT nasal spray Place 1 spray into both nostrils daily as needed for allergies or  rhinitis. 03/15/23   Kerri Perches, MD  gabapentin (NEURONTIN) 300 MG capsule TAKE 1 CAPSULE BY MOUTH TWICE  DAILY 04/17/23   Kerri Perches, MD  hydrocortisone (ANUSOL-HC) 2.5 % rectal cream Place 1 Application rectally 2 (two) times daily. Per rectum for itching and burning for 5-7 days. 09/12/23   Gelene Mink, NP  montelukast (SINGULAIR) 10 MG tablet Take 1 tablet (10 mg total) by mouth daily. 03/28/23   Kerri Perches, MD  Multiple Vitamin (MULTIVITAMIN) capsule Take 1 capsule by mouth daily.    [provider]  promethazine-dextromethorphan (PROMETHAZINE-DM) 6.25-15 MG/5ML syrup Half to one teaspoon at bedtime, as needed, for excessive cough 09/12/23   Kerri Perches, MD  rosuvastatin (CRESTOR) 10 MG tablet Take 1 tablet (10 mg total) by mouth at bedtime. 11/07/23   Kerri Perches, MD  TRAVATAN Z 0.004 % SOLN ophthalmic solution Place 1 drop into both eyes at bedtime. 08/30/22   Gardenia Phlegm, MD  triamterene-hydrochlorothiazide (MAXZIDE) 75-50 MG tablet TAKE 1 TABLET BY MOUTH DAILY 09/13/23   Kerri Perches, MD  venlafaxine XR (EFFEXOR-XR) 75 MG 24 hr capsule Take 1 capsule (75 mg total) by mouth daily with breakfast. 09/26/23 12/25/23  Neysa Hotter, MD      Allergies    Acyclovir and related    Review of Systems   Review of  Systems  Physical Exam Updated Vital Signs BP (!) 158/87 (BP Location: Right Arm)   Pulse 89   Temp 98.4 F (36.9 C) (Oral)   Resp 16   Ht 5\' 1"  (1.549 m)   Wt 54.9 kg   SpO2 100%   BMI 22.86 kg/m  Physical Exam Vitals and nursing note reviewed. Exam conducted with a chaperone present.  Constitutional:      General: She is not in acute distress.    Appearance: She is not toxic-appearing.  HENT:     Head: Normocephalic.     Nose: Nose normal.     Mouth/Throat:     Mouth: Mucous membranes are moist.  Eyes:     Conjunctiva/sclera: Conjunctivae normal.  Cardiovascular:     Rate and Rhythm: Normal rate and regular  rhythm.  Pulmonary:     Effort: Pulmonary effort is normal.     Breath sounds: Normal breath sounds.  Abdominal:     General: Abdomen is flat. There is no distension.     Tenderness: There is no abdominal tenderness. There is no guarding or rebound.  Skin:    General: Skin is warm.     Capillary Refill: Capillary refill takes less than 2 seconds.  Neurological:     Mental Status: She is alert and oriented to person, place, and time.  Psychiatric:        Mood and Affect: Mood normal.        Behavior: Behavior normal.     ED Results / Procedures / Treatments   Labs (all labs ordered are listed, but only abnormal results are displayed) Labs Reviewed - No data to display  EKG None  Radiology DG Chest 2 View Result Date: 12/03/2023 CLINICAL DATA:  Left breast and chest wall pain. EXAM: CHEST - 2 VIEW COMPARISON:  03/07/2021 FINDINGS: The lungs are clear without focal pneumonia, edema, pneumothorax or pleural effusion. The cardiopericardial silhouette is within normal limits for size. No acute bony abnormality. IMPRESSION: No active cardiopulmonary disease. Electronically Signed   By: Kennith Center M.D.   On: 12/03/2023 08:50   DG Shoulder 1 View Left Result Date: 12/03/2023 CLINICAL DATA:  Left breast pain radiates to the left shoulder. EXAM: LEFT SHOULDER COMPARISON:  None Available. FINDINGS: One view study of the left shoulder shows no evidence for an acute fracture. No definite shoulder separation or dislocation. No worrisome lytic or sclerotic osseous abnormality. IMPRESSION: Limited one view study without acute bony abnormality. Electronically Signed   By: Kennith Center M.D.   On: 12/03/2023 08:49    Procedures Procedures    Medications Ordered in ED Medications  lidocaine (LIDODERM) 5 % 1 patch (1 patch Transdermal Patch Applied 12/03/23 0813)  traMADol (ULTRAM) tablet 50 mg (has no administration in time range)  acetaminophen (TYLENOL) tablet 1,000 mg (1,000 mg Oral Given  12/03/23 0813)  ibuprofen (ADVIL) tablet 400 mg (400 mg Oral Given 12/03/23 0813)  methocarbamol (ROBAXIN) tablet 500 mg (500 mg Oral Given 12/03/23 0813)    ED Course/ Medical Decision Making/ A&P Clinical Course as of 12/03/23 0917  Sun Dec 03, 2023  0908 X-ray hands negative.  Still complaining of pain to her shoulder.  Will give tramadol. [TY]    Clinical Course User Index [TY] Coral Spikes, DO                                 Medical Decision Making This  is a 63 year old female presenting emergency department for left breast pain.  She is afebrile nontachycardic hemodynamically stable.  On exam does have some tenderness to the medial aspect of the breast, no palpable masses.  There is some minor dimpling of skin.  She has mammogram pending with PCP.  Will get chest x-ray to exclude deeper structure as cause.  Also complaining of some chronic shoulder pain.  She is neurovascularly intact in that left upper extremity.  Arms are equal sizes, strong pulses.  Low suspicion for DVT as pain seemingly localized to shoulder. However with intermittent tingling she describes question some cervical radiculopathy component. Given multi-modal pain medications here. She has mammogram scheduled with PCP.   Xrs without abnormality. Stable for discharge.   Amount and/or Complexity of Data Reviewed Independent Historian:     Details: Sister notes personal history with breast cancner External Data Reviewed:     Details: Saw pcp recently for same; mamogram ordered Radiology: ordered.  Risk OTC drugs. Prescription drug management.          Final Clinical Impression(s) / ED Diagnoses Final diagnoses:  Breast pain  Chronic left shoulder pain    Rx / DC Orders ED Discharge Orders          Ordered    lidocaine 4 %  Every 24 hours        12/03/23 0910    traMADol (ULTRAM) 50 MG tablet  Every 6 hours PRN        12/03/23 0910              Coral Spikes, DO 12/03/23 [phone]

## 2023-12-03 NOTE — ED Triage Notes (Signed)
Pt c/o left breast pain starting about 2 months ago that has led up to her left shoulder and down left fingers. Slight redness noted to left breast tissue. Pt states saw a provider and was told to get a mammogram that is not scheduled until March.

## 2023-12-07 ENCOUNTER — Encounter: Payer: Self-pay | Admitting: Gastroenterology
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
## 2023-12-13 ENCOUNTER — Encounter: Payer: Self-pay | Admitting: Orthopaedic Surgery

## 2023-12-13 ENCOUNTER — Other Ambulatory Visit (INDEPENDENT_AMBULATORY_CARE_PROVIDER_SITE_OTHER): Payer: 59

## 2023-12-13 ENCOUNTER — Telehealth (INDEPENDENT_AMBULATORY_CARE_PROVIDER_SITE_OTHER): Payer: 59 | Admitting: Psychiatry

## 2023-12-13 ENCOUNTER — Encounter: Payer: Self-pay | Admitting: Psychiatry

## 2023-12-13 ENCOUNTER — Ambulatory Visit (INDEPENDENT_AMBULATORY_CARE_PROVIDER_SITE_OTHER): Payer: 59 | Admitting: Orthopaedic Surgery

## 2023-12-13 VITALS — Ht 61.0 in | Wt 120.0 lb

## 2023-12-13 DIAGNOSIS — M79602 Pain in left arm: Secondary | ICD-10-CM

## 2023-12-13 DIAGNOSIS — M4722 Other spondylosis with radiculopathy, cervical region: Secondary | ICD-10-CM

## 2023-12-13 DIAGNOSIS — F3341 Major depressive disorder, recurrent, in partial remission: Secondary | ICD-10-CM

## 2023-12-13 DIAGNOSIS — M7542 Impingement syndrome of left shoulder: Secondary | ICD-10-CM

## 2023-12-13 MED ORDER — PREDNISONE 5 MG (21) PO TBPK: ORAL_TABLET | ORAL | 0 refills | Status: DC

## 2023-12-13 MED ORDER — LIDOCAINE HCL 1 % IJ SOLN
0.5000 mL | INTRAMUSCULAR | Status: AC | PRN
  Administered 2023-12-13: .5 mL

## 2023-12-13 MED ORDER — BUPIVACAINE HCL 0.25 % IJ SOLN
4.0000 mL | INTRAMUSCULAR | Status: AC | PRN
  Administered 2023-12-13: 4 mL via INTRA_ARTICULAR

## 2023-12-13 MED ORDER — VENLAFAXINE HCL ER 75 MG PO CP24: 75.0000 mg | ORAL_CAPSULE | Freq: Every day | ORAL | 3 refills | Status: DC

## 2023-12-13 MED ORDER — METHYLPREDNISOLONE ACETATE 40 MG/ML IJ SUSP
40.0000 mg | INTRAMUSCULAR | Status: AC | PRN
  Administered 2023-12-13: 40 mg via INTRA_ARTICULAR

## 2023-12-13 NOTE — Progress Notes (Signed)
 Office Visit Note   Patient: Toni Miller           Date of Birth: 1961/06/12           MRN: [id number] Visit Date: 12/13/2023              Requested by: Antonetta Rollene BRAVO, MD [address] [place],  [place] [address] PCP: Antonetta Rollene BRAVO, MD   Assessment & Plan: Visit Diagnoses:  1. Left arm pain   2. Other spondylosis with radiculopathy, cervical region   3. Impingement syndrome of left shoulder     Plan: Shoulder injection performed which she tolerated well.  Will send in Medrol  dose pack 5 mg which she will take with food recheck 1 month.  Follow-Up Instructions: No follow-ups on file.   Orders:  Orders Placed This Encounter  Procedures   Large Joint Inj: L subacromial bursa   XR Cervical Spine 2 or 3 views   XR Shoulder Left   Meds ordered this encounter  Medications   predniSONE  (STERAPRED UNI-PAK 21 TAB) 5 MG (21) TBPK tablet    Sig: Take 6,5,4,3,2,1 one tablet less each day with food    Dispense:  21 tablet    Refill:  0      Procedures: Large Joint Inj: L subacromial bursa on 12/13/2023 10:27 AM Indications: pain Details: 1.5 in  Arthrogram: No  Medications: 4 mL bupivacaine  0.25 %; 40 mg methylPREDNISolone  acetate 40 MG/ML; 0.5 mL lidocaine  1 % Outcome: tolerated well, no immediate complications Procedure, treatment alternatives, risks and benefits explained, specific risks discussed. Consent was given by the patient. Immediately prior to procedure a time out was called to verify the correct patient, procedure, equipment, support staff and site/side marked as required. Patient was prepped and draped in the usual sterile fashion.       Clinical Data: No additional findings.   Subjective: Chief Complaint  Patient presents with   Left Arm - Pain    HPI 63 year old female seen with increasing neck pain left shoulder pain with worse last 6 to 8 weeks.  She difficulty getting left upper elbow over her head also has tingling  radiating into her left fingers.  Past history of epidural injection cervical spine years ago that gave her relief with the single injection.  Previous imaging studies were 15+ years ago.  Denies myelopathic symptoms.  No right upper extremity symptoms.  Review of Systems past history of hypertension.  All systems noncontributory to HPI.   Objective: Vital Signs: Ht 5' 1 (1.549 m)   Wt 120 lb (54.4 kg)   BMI 22.67 kg/m   Physical Exam Constitutional:      Appearance: She is well-developed.  HENT:     Head: Normocephalic.     Right Ear: External ear normal.     Left Ear: External ear normal. There is no impacted cerumen.  Eyes:     Pupils: Pupils are equal, round, and reactive to light.  Neck:     Thyroid : No thyromegaly.     Trachea: No tracheal deviation.  Cardiovascular:     Rate and Rhythm: Normal rate.  Pulmonary:     Effort: Pulmonary effort is normal.  Abdominal:     Palpations: Abdomen is soft.  Musculoskeletal:     Cervical back: No rigidity.  Skin:    General: Skin is warm and dry.  Neurological:     Mental Status: She is alert and oriented to person, place, and time.  Psychiatric:        Behavior: Behavior normal.     Ortho Exam cervical rotation tilting less than 50%.  Patient has some left thoracic curvature approximately 10 to 15 degrees.  Positive brachioplexus tenderness on the left negative on the right positive Spurling on the left.  Mild to moderate impingement she lacks 10 degrees full abduction positive impingement shoulder acromioclavicular joint and biceps long head is normal to exam.  Negative crossarm test.  Please are symmetrical.  Decreased sensation more radial 3 fingers in the ulnar 2 on the left hand only.  No lower extremity clonus normal heel-toe gait.  Specialty Comments:  No specialty comments available.  Imaging: XR Shoulder Left Result Date: 12/13/2023 3 views left shoulder obtained and reviewed mild acromioclavicular narrowing.  No  glenohumeral arthritis.  Lung field visualized was clear. Impression: Normal left shoulder radiographs.  XR Cervical Spine 2 or 3 views Result Date: 12/13/2023 [facility] lateral cervical spine images demonstrates significant multilevel cervical spondylosis from C3-C7 with 80% loss of height and prominent anterior posterior spurring throughout the mid cervical spine with reversal of lordosis. Impression mid cervical spondylosis multilevel C3-C7.    PMFS History: Patient Active Problem List   Diagnosis Date Noted   Other spondylosis with radiculopathy, cervical region 12/13/2023   Impingement syndrome of left shoulder 12/13/2023   Breast pain, left 11/23/2023   Acute pharyngitis 09/12/2023   Cough 09/12/2023   Encounter for Medicare annual examination with abnormal findings 08/23/2023   Immunization due 08/23/2023   Hip pain, acute, left 08/23/2023   Dysuria 06/30/2023   Bloating 06/08/2023   Cellulitis and abscess of buttock 04/26/2023   Hot flashes due to menopause 04/26/2023   Great toe pain, unspecified laterality 04/21/2023   Positive self-administered antigen test for COVID-19 12/23/2022   Syncope and collapse 12/09/2022   Encounter for annual general medical examination with abnormal findings in adult 07/10/2022   Right knee pain 09/07/2021   Headache, variant migraine 07/07/2021   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   Menopausal vaginal dryness 10/09/2019   Knee pain, left 04/26/2019   Left hip pain 04/26/2019   Dyspepsia 12/12/2018   FH: breast cancer in first degree relative when <61 years old 06/06/2018   Neck pain 04/21/2016   Arthritis of both knees 04/25/2015   Osteopenia 04/02/2014   Depression, major, recurrent, in partial remission (HCC) 01/06/2014   H/O  abnormal Pap smear 04/20/2013   Dermatitis 12/19/2011   Acute laryngitis 08/16/2011   Thyroid  nodule 08/16/2011   Right thigh pain 06/01/2011   Chronic constipation 06/01/2011   Low back pain with left-sided sciatica 12/29/2009   Allergic rhinitis 05/14/2008   Hyperlipidemia with target LDL less than 100 01/29/2008   GLAUCOMA 01/29/2008   Hypertension 01/29/2008   GERD 01/29/2008   Past Medical History:  Diagnosis Date   Anxiety    Arthritis    Cancer (HCC) 1993   abnormal pap treated at [facility]   Constipation    Depression    GERD (gastroesophageal reflux disease)    Glaucoma 1993   History of kidney stones    Hot flashes, menopausal 06/12/2011   Hyperlipidemia    Hypertension    Neck pain 7/09   WITH BULGING DISC-----RECIEVING EPIDURALS    Sinusitis     Family History  Adopted: Yes  Problem Relation Age of Onset   Alcohol abuse Mother    Stomach cancer Mother 50   Lung cancer Father    Glaucoma Father    Alcohol abuse Father    Seizures Father    Breast cancer Sister 8   Breast cancer Sister 72   Heart disease Sister    Heart attack Sister    Glaucoma Son    Glaucoma Maternal Grandfather    Cancer Maternal Grandfather        poss leukemia   SIDS Brother    Hyperthyroidism Daughter    Seizures Son        as a development worker, international aid   Kidney disease Maternal Grandmother    Heart disease Maternal Grandmother        visual merchandiser   Leukemia Maternal Grandmother    Colon cancer Neg Hx     Past Surgical History:  Procedure Laterality Date   ABDOMINAL EXPLORATION SURGERY  age 57   bowel obstruction, [facility]   ABDOMINAL HYSTERECTOMY  2007   [facility], [facility]   BILATERAL EYE SURGERY     for glaucoma   BIOPSY  01/09/2019   Procedure: BIOPSY;  Surgeon: Harvey Margo CROME, MD;  [facility];  Service: Endoscopy;;  gstric    bone removed from under tongue     BREAST BIOPSY Left    benign   BREAST CYST EXCISION Left 2004   BREAST SURGERY Left 2004   left partial mastectomy, [facility]    CATARACT EXTRACTION Right 06/04/2015   CATARACT EXTRACTION W/PHACO  05/16/2011   Procedure: CATARACT EXTRACTION PHACO AND INTRAOCULAR LENS PLACEMENT (IOC);  Surgeon: Cherene Mania;  Location: [facility];  Service: Ophthalmology;  Laterality: Right;   COLONOSCOPY N/A 05/17/2013   Dr. Sharla diverticulosis was noted/small internal hemorrhoids   COLONOSCOPY WITH PROPOFOL  N/A 04/12/2021   Colonoscopy June 2022: small anal sphincter s/p rectal surgery, internal hemorrhoids, one 2 mm polyp in cecum (benign). 10 year surveillance.   CYST REMOVED LEFT BREAST /BENIGN  2005   left, [facility]   ESOPHAGOGASTRODUODENOSCOPY N/A 01/09/2019   normal esophagus. Mild NSAID gastritis s/p biopsy. +H.pylori. treated with amoxicillin , biaxin , Nexium .. ERADICATION DOCUMENTED   EYE SURGERY  2010, 1992 approx   bilateral   POLYPECTOMY  04/12/2021   Procedure: POLYPECTOMY;  Surgeon: Cindie Carlin POUR, DO;  [facility];  Service: Endoscopy;;   REPAIR IMPERFORATE ANUS / ANORECTOPLASTY     per patient   TOTAL ABDOMINAL HYSTERECTOMY W/ BILATERAL SALPINGOOPHORECTOMY  2007   [facility], [facility]   TUBAL LIGATION  1991   Social History   Occupational History   Occupation: UNEMPLOYED 2011    Comment: disability    Employer: DISABLED  Tobacco Use   Smoking status: Former    Current packs/day: 0.00    Average packs/day: 3.0 packs/day for 2.0 years (6.0 ttl pk-yrs)    Types: Cigarettes    Start date: 05/11/1989    Quit date: 05/12/1991    Years since quitting: 32.6   Smokeless tobacco: Never  Vaping Use   Vaping status: Never Used  Substance and Sexual Activity   Alcohol use: Yes    Comment: occasionally   Drug use: Never   Sexual activity: Yes    Birth control/protection: Surgical

## 2023-12-13 NOTE — Patient Instructions (Signed)
 Continue venlafaxine  75 mg daily   Next appointment: 6/4 at 8 am

## 2023-12-14 ENCOUNTER — Other Ambulatory Visit: Payer: Self-pay | Admitting: Family Medicine

## 2023-12-19 ENCOUNTER — Telehealth: Payer: Self-pay | Admitting: Psychiatry

## 2023-12-19 NOTE — Telephone Encounter (Signed)
Spoke to patient confirming patient is aware her pcp will take over her prescriptions. Patient states she sees doctor next month and as far as she knows that is the plan. Voiced understanding that her June 2025 appointment will be cancelled but if needs [place] to call back.

## 2024-01-09 ENCOUNTER — Ambulatory Visit (HOSPITAL_COMMUNITY)
Admission: RE | Admit: 2024-01-09 | Discharge: 2024-01-09 | Disposition: A | Discharge status: Home / Self Care | Payer: 59 | Source: Ambulatory Visit | Attending: Family Medicine | Admitting: Family Medicine

## 2024-01-09 ENCOUNTER — Ambulatory Visit (HOSPITAL_COMMUNITY)
Admission: RE | Admit: 2024-01-09 | Discharge: 2024-01-09 | Disposition: A | Discharge status: Home / Self Care | Payer: 59 | Source: Ambulatory Visit | Attending: Family Medicine

## 2024-01-09 ENCOUNTER — Encounter (HOSPITAL_COMMUNITY): Payer: Self-pay

## 2024-01-09 DIAGNOSIS — R92313 Mammographic fatty tissue density, bilateral breasts: Secondary | ICD-10-CM | POA: Diagnosis not present

## 2024-01-09 DIAGNOSIS — N644 Mastodynia: Secondary | ICD-10-CM

## 2024-01-10 ENCOUNTER — Ambulatory Visit: Payer: 59 | Admitting: Orthopaedic Surgery

## 2024-01-11 ENCOUNTER — Encounter: Payer: Self-pay | Admitting: Family Medicine

## 2024-01-11 ENCOUNTER — Ambulatory Visit (INDEPENDENT_AMBULATORY_CARE_PROVIDER_SITE_OTHER): Payer: 59 | Admitting: Family Medicine

## 2024-01-11 VITALS — BP 120/80 | HR 112 | Resp 16 | Ht 61.0 in | Wt 119.1 lb

## 2024-01-11 DIAGNOSIS — F3341 Major depressive disorder, recurrent, in partial remission: Secondary | ICD-10-CM

## 2024-01-11 DIAGNOSIS — Z1231 Encounter for screening mammogram for malignant neoplasm of breast: Secondary | ICD-10-CM | POA: Diagnosis not present

## 2024-01-11 DIAGNOSIS — I10 Essential (primary) hypertension: Secondary | ICD-10-CM | POA: Diagnosis not present

## 2024-01-11 DIAGNOSIS — E785 Hyperlipidemia, unspecified: Secondary | ICD-10-CM | POA: Diagnosis not present

## 2024-01-11 DIAGNOSIS — F411 Generalized anxiety disorder: Secondary | ICD-10-CM

## 2024-01-11 DIAGNOSIS — M7542 Impingement syndrome of left shoulder: Secondary | ICD-10-CM | POA: Diagnosis not present

## 2024-01-11 NOTE — Patient Instructions (Addendum)
 Follow-up in 8 to 10 weeks, call if you need me sooner.  You are referred to therapy here in the office regarding depression and anxiety.  Please reschedule your appointment with Dr. Ophelia Charter regarding shoulder and leg pain.  Please schedule September mammogram at checkout.  Thanks for choosing [facility], we consider it a privelige to serve you.

## 2024-01-14 ENCOUNTER — Encounter: Payer: Self-pay | Admitting: Family Medicine

## 2024-01-14 DIAGNOSIS — F411 Generalized anxiety disorder: Secondary | ICD-10-CM | POA: Insufficient documentation

## 2024-01-14 NOTE — Assessment & Plan Note (Signed)
 Needs ongoing management by Ortho I have advised her of this

## 2024-01-14 NOTE — Progress Notes (Addendum)
 Toni Miller     MRN: [id number]      DOB: Sep 12, 1961  Chief Complaint  Patient presents with   arm numbness    Has been having numbness in her left arm and hand especially at night off and on for a long time    HPI Toni Miller is here for follow up and re-evaluation of chronic medical conditions, medication management and review of any available recent lab and radiology data.  Preventive health is updated, specifically  Cancer screening and Immunization.   Questions or concerns regarding consultations or procedures which the PT has had in the interim are  addressed. The PT denies any adverse reactions to current medications since the last visit.  C/o chronic left neck and upper ext pain and uncontrolled depression and anxiety ROS Denies recent fever or chills. Denies sinus pressure, nasal congestion, ear pain or sore throat. Denies chest congestion, productive cough or wheezing. Denies chest pains, palpitations and leg swelling Denies abdominal pain, nausea, vomiting,diarrhea or constipation.   Denies dysuria, frequency, hesitancy or incontinence. . Reports increased  depression and  anxiety will benefit from therapy and is interested in this Patient and/or legal guardian verbally consented to [facility] services about presenting concerns and psychiatric consultation as appropriate.  The services will be billed as appropriate for the patient . Denies skin break down or rash.   PE  BP 120/80   Pulse (!) 112   Resp 16   Ht 5\' 1"  (1.549 m)   Wt 119 lb 1.9 oz (54 kg)   SpO2 95%   BMI 22.51 kg/m   Patient alert and oriented and in no cardiopulmonary distress.  HEENT: No facial asymmetry, EOMI,     Neck decreased ROM .  Chest: Clear to auscultation bilaterally.  CVS: S1, S2 no murmurs, no S3.Regular rate.  ABD: Soft non tender.   Ext: No edema  MS: Adequate ROM spine, shoulders, hips and knees.  Skin: Intact, no ulcerations or rash  noted.  Psych: Good eye contact, normal affect. Memory intact tearful anxious and mildly depressed appearing.  CNS: CN 2-12 intact, power,  normal throughout.no focal deficits noted.   Assessment & Plan  Depression, major, recurrent, in partial remission (HCC) Not adequately managed , will benefit from therapy and desires this  Hypertension Controlled, no change in medication DASH diet and commitment to daily physical activity for a minimum of 30 minutes discussed and encouraged, as a part of hypertension management. The importance of attaining a healthy weight is also discussed.     01/11/2024    1:37 PM 01/11/2024    1:09 PM 12/13/2023    9:53 AM 12/03/2023    7:29 AM 12/03/2023    7:27 AM 12/03/2023    7:23 AM 11/23/2023    1:01 PM  BP/Weight  Systolic BP 120 133   158 158 478  Diastolic BP 80 86   87 87 93  Wt. (Lbs)  119.12 120 121 121.03  121.04  BMI  22.51 kg/m2 22.67 kg/m2 22.86 kg/m2 22.87 kg/m2  22.87 kg/m2       Hyperlipidemia with target LDL less than 100 Hyperlipidemia:Low fat diet discussed and encouraged.   Lipid Panel  Lab Results  Component Value Date   CHOL 185 10/19/2023   HDL 73 10/19/2023   LDLCALC 95 10/19/2023   TRIG 95 10/19/2023   CHOLHDL 2.5 10/19/2023     Controlled, no change in medication   Impingement syndrome of left shoulder  Needs ongoing management by Ortho I have advised her of this  GAD (generalized anxiety disorder) Uncontrolled , despite medication, refer therapy

## 2024-01-14 NOTE — Assessment & Plan Note (Signed)
 Not adequately managed , will benefit from therapy and desires this

## 2024-01-14 NOTE — Assessment & Plan Note (Signed)
 Hyperlipidemia:Low fat diet discussed and encouraged.   Lipid Panel  Lab Results  Component Value Date   CHOL 185 10/19/2023   HDL 73 10/19/2023   LDLCALC 95 10/19/2023   TRIG 95 10/19/2023   CHOLHDL 2.5 10/19/2023     Controlled, no change in medication

## 2024-01-14 NOTE — Assessment & Plan Note (Signed)
 Controlled, no change in medication DASH diet and commitment to daily physical activity for a minimum of 30 minutes discussed and encouraged, as a part of hypertension management. The importance of attaining a healthy weight is also discussed.     01/11/2024    1:37 PM 01/11/2024    1:09 PM 12/13/2023    9:53 AM 12/03/2023    7:29 AM 12/03/2023    7:27 AM 12/03/2023    7:23 AM 11/23/2023    1:01 PM  BP/Weight  Systolic BP 120 133   158 158 811  Diastolic BP 80 86   87 87 93  Wt. (Lbs)  119.12 120 121 121.03  121.04  BMI  22.51 kg/m2 22.67 kg/m2 22.86 kg/m2 22.87 kg/m2  22.87 kg/m2

## 2024-01-14 NOTE — Assessment & Plan Note (Signed)
 Uncontrolled , despite medication, refer therapy

## 2024-01-18 ENCOUNTER — Ambulatory Visit: Admitting: Orthopaedic Surgery

## 2024-01-24 ENCOUNTER — Ambulatory Visit (INDEPENDENT_AMBULATORY_CARE_PROVIDER_SITE_OTHER): Admitting: Orthopaedic Surgery

## 2024-01-24 DIAGNOSIS — M7542 Impingement syndrome of left shoulder: Secondary | ICD-10-CM

## 2024-01-24 DIAGNOSIS — M4722 Other spondylosis with radiculopathy, cervical region: Secondary | ICD-10-CM | POA: Diagnosis not present

## 2024-01-24 NOTE — Progress Notes (Signed)
 Office Visit Note   Patient: Toni Miller           Date of Birth: 1961/06/11           MRN: [id number] Visit Date: 01/24/2024              Requested by: Kerri Perches, MD [address] [place],  [place] [address] PCP: Kerri Perches, MD   Assessment & Plan: Visit Diagnoses:  1. Other spondylosis with radiculopathy, cervical region   2. Impingement syndrome of left shoulder     Plan: Patient with improvement left shoulder postinjection.  Cervical spondylosis is stable.  She has not had imaging studies in many years.  She was improved with Medrol Dosepak in the past and neck is doing well at the moment.  Continues to have some problems with hand numbness and may have carpal tunnel.  She has a splint at home and she can use this at night and see how she does with that and if it takes care of her symptoms.  If not she could see Dr.Agurwala for her carpal tunnel workup versus seeing Dr. Christell Constant if she has recurrent cervical spine problems.  Follow-Up Instructions: No follow-ups on file.   Orders:  No orders of the defined types were placed in this encounter.  No orders of the defined types were placed in this encounter.     Procedures: No procedures performed   Clinical Data: No additional findings.   Subjective: Chief Complaint  Patient presents with   Left Hand - Numbness    HPI 62 year old female with previous left shoulder injection 12/13/2023 also has a prednisone pack which she states helped her shoulder.  She still has some continual tingling in her hand mostly radial side of her hand.  It bothers her at night.  Shoulder range of motion is significantly improved.  Past history of cervical epidurals many years ago.  She also relates that she had been told she had carpal tunnel in the past.  Does not drop objects.  Review of Systems all other systems are noncontributory HPI.   Objective: Vital Signs: There were no vitals taken for this  visit.  Physical Exam Constitutional:      Appearance: She is well-developed.  HENT:     Head: Normocephalic.     Right Ear: External ear normal.     Left Ear: External ear normal. There is no impacted cerumen.  Eyes:     Pupils: Pupils are equal, round, and reactive to light.  Neck:     Thyroid: No thyromegaly.     Trachea: No tracheal deviation.  Cardiovascular:     Rate and Rhythm: Normal rate.  Pulmonary:     Effort: Pulmonary effort is normal.  Abdominal:     Palpations: Abdomen is soft.  Musculoskeletal:     Cervical back: No rigidity.  Skin:    General: Skin is warm and dry.  Neurological:     Mental Status: She is alert and oriented to person, place, and time.  Psychiatric:        Behavior: Behavior normal.     Ortho Exam good range of motion shoulder negative drop arm test negative impingement.  Some tenderness to the brachial plexus no thenar atrophy.  Some discomfort with carpal compression mildly positive Phalen's test on the right negative on the left.  Specialty Comments:  No specialty comments available.  Imaging: No results found.   PMFS History: Patient Active Problem  List   Diagnosis Date Noted   GAD (generalized anxiety disorder) 01/14/2024   Other spondylosis with radiculopathy, cervical region 12/13/2023   Impingement syndrome of left shoulder 12/13/2023   Breast pain, left 11/23/2023   Cough 09/12/2023   Immunization due 08/23/2023   Bloating 06/08/2023   Hot flashes due to menopause 04/26/2023   Great toe pain, unspecified laterality 04/21/2023   Positive self-administered antigen test for COVID-19 12/23/2022   Syncope and collapse 12/09/2022   Right knee pain 09/07/2021   Headache, variant migraine 07/07/2021   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/24/2020   Menopausal vaginal dryness 10/09/2019   Knee pain, left 04/26/2019   Left hip pain 04/26/2019   Dyspepsia 12/12/2018   FH: breast cancer in first degree relative when <20 years old 06/06/2018   Neck pain 04/21/2016   Arthritis of both knees 04/25/2015   Osteopenia 04/02/2014   Depression, major, recurrent, in partial remission (HCC) 01/06/2014   H/O abnormal Pap smear 04/20/2013   Thyroid nodule 08/16/2011   Right thigh pain 06/01/2011   Chronic constipation 06/01/2011   Low back pain with left-sided sciatica 12/29/2009   Hyperlipidemia with target LDL less than 100 01/29/2008   GLAUCOMA 01/29/2008   Hypertension 01/29/2008   GERD 01/29/2008   Past Medical History:  Diagnosis Date   Anxiety    Arthritis    Cancer (HCC) 1993   abnormal pap [facility] at [facility]   Constipation    Depression    GERD (gastroesophageal reflux disease)    Glaucoma 1993   History of kidney stones    Hot flashes, menopausal 06/12/2011   Hyperlipidemia    Hypertension    Neck pain 7/09   WITH BULGING DISC-----RECIEVING EPIDURALS    Sinusitis     Family History  Adopted: Yes  Problem Relation Age of Onset   Alcohol abuse Mother    Stomach cancer Mother 81   Lung cancer Father    Glaucoma Father    Alcohol abuse Father    Seizures Father    Breast cancer Sister 72   Breast cancer Sister 22   Heart disease Sister    Heart attack Sister    Glaucoma Son    Glaucoma Maternal Grandfather    Cancer Maternal Grandfather        poss leukemia   SIDS Brother    Hyperthyroidism Daughter    Seizures Son        as a Development worker, international aid   Kidney disease Maternal Grandmother    Heart disease Maternal Grandmother        Visual merchandiser   Leukemia Maternal Grandmother    Colon cancer Neg Hx     Past Surgical History:  Procedure Laterality Date   ABDOMINAL EXPLORATION SURGERY  age 63   bowel obstruction, [facility]   ABDOMINAL HYSTERECTOMY  2007   [facility], [facility]   BILATERAL EYE SURGERY     for glaucoma    BIOPSY  01/09/2019   Procedure: BIOPSY;  Surgeon: West Bali, MD;  Location[facility];  Service: Endoscopy;;  gstric    bone removed from under tongue     BREAST BIOPSY Left    benign   BREAST CYST EXCISION Left 2004   BREAST SURGERY Left 2004   left partial mastectomy, [facility]   CATARACT EXTRACTION Right  06/04/2015   CATARACT EXTRACTION W/PHACO  05/16/2011   Procedure: CATARACT EXTRACTION PHACO AND INTRAOCULAR LENS PLACEMENT (IOC);  Surgeon: Gemma Payor;  Location: [facility];  Service: Ophthalmology;  Laterality: Right;   COLONOSCOPY N/A 05/17/2013   Dr. Cyndi Bender diverticulosis was noted/small internal hemorrhoids   COLONOSCOPY WITH PROPOFOL N/A 04/12/2021   Colonoscopy June 2022: small anal sphincter s/p rectal surgery, internal hemorrhoids, one 2 mm polyp in cecum (benign). 10 year surveillance.   CYST REMOVED LEFT BREAST /BENIGN  2005   left, [facility]   ESOPHAGOGASTRODUODENOSCOPY N/A 01/09/2019   normal esophagus. Mild NSAID gastritis s/p biopsy. +H.pylori. treated with amoxicillin, biaxin, Nexium.Marland Kitchen ERADICATION DOCUMENTED   EYE SURGERY  2010, 1992 approx   bilateral   POLYPECTOMY  04/12/2021   Procedure: POLYPECTOMY;  Surgeon: Lanelle Bal, DO;  [facility];  Service: Endoscopy;;   REPAIR IMPERFORATE ANUS / ANORECTOPLASTY     per patient   TOTAL ABDOMINAL HYSTERECTOMY W/ BILATERAL SALPINGOOPHORECTOMY  2007   [facility], [facility]   TUBAL LIGATION  1991   Social History   Occupational History   Occupation: UNEMPLOYED 2011    Comment: disability    Employer: DISABLED  Tobacco Use   Smoking status: Former    Current packs/day: 0.00    Average packs/day: 3.0 packs/day for 2.0 years (6.0 ttl pk-yrs)    Types: Cigarettes    Start date: 05/11/1989    Quit date: 05/12/1991    Years since quitting: 32.7   Smokeless tobacco: Never  Vaping Use   Vaping status: Never Used  Substance and Sexual Activity   Alcohol use: Yes    Comment: occasionally   Drug use: Never    Sexual activity: Yes    Birth control/protection: Surgical

## 2024-01-26 ENCOUNTER — Ambulatory Visit: Admitting: Professional Counselor

## 2024-01-26 DIAGNOSIS — F3341 Major depressive disorder, recurrent, in partial remission: Secondary | ICD-10-CM
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
## 2024-01-30 ENCOUNTER — Ambulatory Visit (INDEPENDENT_AMBULATORY_CARE_PROVIDER_SITE_OTHER): Payer: 59 | Admitting: Gastroenterology

## 2024-01-30 ENCOUNTER — Encounter: Payer: Self-pay | Admitting: Gastroenterology

## 2024-01-30 VITALS — BP 125/82 | HR 84 | Temp 97.1°F | Ht 61.0 in | Wt 119.5 lb

## 2024-01-30 DIAGNOSIS — R140 Abdominal distension (gaseous): Secondary | ICD-10-CM

## 2024-01-30 DIAGNOSIS — K219 Gastro-esophageal reflux disease without esophagitis: Secondary | ICD-10-CM

## 2024-01-30 DIAGNOSIS — K5900 Constipation, unspecified: Secondary | ICD-10-CM

## 2024-01-30 NOTE — Patient Instructions (Addendum)
 Let's try the low FODMAP diet again.   Continue Nexium daily as you are doing.  We will see you in 6 months!  Please call with any concerns in the meantime!  I enjoyed seeing you again today! I value our relationship and want to provide genuine, compassionate, and quality care. You may receive a survey regarding your visit with me, and I welcome your feedback! Thanks so much for taking the time to complete this. I look forward to seeing you again.      Gelene Mink, PhD, ANP-BC [address] [facility]

## 2024-01-30 NOTE — Progress Notes (Signed)
 Gastroenterology Office Note     Primary Care Physician:  Kerri Perches, MD  Primary Gastroenterologist: Dr. Marletta Lor    Chief Complaint   Chief Complaint  Patient presents with   Gastroesophageal Reflux    Patient here today for a follow up on Gerd, which she says she does well with Esomeprazole 40 mg once per day,but it prescribed by Dr. Lodema Hong for bid.    Constipation    Patient has some occasional issues with constipation, but none recently, but does have a lot of gas after eating vegetables.   Hemorrhoids    No current issues with hemorrhoids.     History of Present Illness   Toni Miller is a 63 y.o. female presenting today with a history of chronic constipation, bloating, GERD, +H.yplori 2020 with documented eradication via breath test in Nov 2021. She had banding of all 3 columns, with last in 2023, returning for routine follow-up. Negative celiac serologies in the past.    Weight fluctuates. No abdominal pain. Eats lots of veggies which helps with constipation. Sometimes pill dysphagia but no solid food dysphagia. Good appetite. Notes gas and bloating. She has tried the FODMAP diet before with some improvement with these symptoms. She tends to like foods that are from the higher FODMAP diet.   No recent rectal bleeding. No issues with hemorrhoids.     Colonoscopy 2022: small anal sphincter s/p rectal surgery, internal hemorrhoids, one 2 mm polyp in cecum benign, 10 year surveillance.   Past Medical History:  Diagnosis Date   Anxiety    Arthritis    Cancer (HCC) 1993   abnormal pap [facility] at [facility]   Constipation    Depression    GERD (gastroesophageal reflux disease)    Glaucoma 1993   History of kidney stones    Hot flashes, menopausal 06/12/2011   Hyperlipidemia    Hypertension    Neck pain 7/09   WITH BULGING DISC-----RECIEVING EPIDURALS    Sinusitis     Past Surgical History:  Procedure Laterality Date   ABDOMINAL EXPLORATION SURGERY  age  36   bowel obstruction, [facility]   ABDOMINAL HYSTERECTOMY  2007   [facility], [facility]   BILATERAL EYE SURGERY     for glaucoma   BIOPSY  01/09/2019   Procedure: BIOPSY;  Surgeon: West Bali, MD;  Location: [facility] ENDO SUITE;  Service: Endoscopy;;  gstric    bone removed from under tongue     BREAST BIOPSY Left    benign   BREAST CYST EXCISION Left 2004   BREAST SURGERY Left 2004   left partial mastectomy, [facility]   CATARACT EXTRACTION Right 06/04/2015   CATARACT EXTRACTION W/PHACO  05/16/2011   Procedure: CATARACT EXTRACTION PHACO AND INTRAOCULAR LENS PLACEMENT (IOC);  Surgeon: Gemma Payor;  Location: [facility] ORS;  Service: Ophthalmology;  Laterality: Right;   COLONOSCOPY N/A 05/17/2013   Dr. Cyndi Bender diverticulosis was noted/small internal hemorrhoids   COLONOSCOPY WITH PROPOFOL N/A 04/12/2021   Colonoscopy June 2022: small anal sphincter s/p rectal surgery, internal hemorrhoids, one 2 mm polyp in cecum (benign). 10 year surveillance.   CYST REMOVED LEFT BREAST /BENIGN  2005   left, [facility]   ESOPHAGOGASTRODUODENOSCOPY N/A 01/09/2019   normal esophagus. Mild NSAID gastritis s/p biopsy. +H.pylori. treated with amoxicillin, biaxin, Nexium.Marland Kitchen ERADICATION DOCUMENTED   EYE SURGERY  2010, 1992 approx   bilateral   POLYPECTOMY  04/12/2021   Procedure: POLYPECTOMY;  Surgeon: Lanelle Bal, DO;  Location: [facility];  Service:  Endoscopy;;   REPAIR IMPERFORATE ANUS / ANORECTOPLASTY     per patient   TOTAL ABDOMINAL HYSTERECTOMY W/ BILATERAL SALPINGOOPHORECTOMY  2007   [facility], [place]   TUBAL LIGATION  1991    Current Outpatient Medications  Medication Sig Dispense Refill   cetirizine (ZYRTEC) 5 MG chewable tablet Chew 5 mg by mouth daily.     cholecalciferol (VITAMIN D3) 25 MCG (1000 UNIT) tablet Take 1,000 Units by mouth daily.     conjugated estrogens (PREMARIN) vaginal cream APPLY FINGERTIP AMOUNT VAGINALLY 3 TIMES WEEKLY AS NEEDED 30 g 2   cycloSPORINE (RESTASIS) 0.05 % ophthalmic emulsion Place 1 drop  into both eyes 2 (two) times daily.     esomeprazole (NEXIUM) 40 MG capsule TAKE 1 CAPSULE BY MOUTH TWICE  DAILY 60 capsule 11   fluticasone (FLONASE) 50 MCG/ACT nasal spray Place 1 spray into both nostrils daily as needed for allergies or rhinitis. 16 mL 2   gabapentin (NEURONTIN) 300 MG capsule TAKE 1 CAPSULE BY MOUTH TWICE  DAILY 60 capsule 11   montelukast (SINGULAIR) 10 MG tablet TAKE 1 TABLET BY MOUTH DAILY 80 tablet 3   Multiple Vitamin (MULTIVITAMIN) capsule Take 1 capsule by mouth daily.     rosuvastatin (CRESTOR) 10 MG tablet Take 1 tablet (10 mg total) by mouth at bedtime. 100 tablet 2   TRAVATAN Z 0.004 % SOLN ophthalmic solution Place 1 drop into both eyes at bedtime.     triamterene-hydrochlorothiazide (MAXZIDE) 75-50 MG tablet TAKE 1 TABLET BY MOUTH DAILY 100 tablet 2   venlafaxine XR (EFFEXOR-XR) 75 MG 24 hr capsule Take 1 capsule (75 mg total) by mouth daily with breakfast. 30 capsule 3   No current facility-administered medications for this visit.    Allergies as of 01/30/2024 - Review Complete 01/30/2024  Allergen Reaction Noted   Acyclovir and related Rash 03/02/2015    Family History  Adopted: Yes  Problem Relation Age of Onset   Alcohol abuse Mother    Stomach cancer Mother 42   Lung cancer Father    Glaucoma Father    Alcohol abuse Father    Seizures Father    Breast cancer Sister 79   Breast cancer Sister 24   Heart disease Sister    Heart attack Sister    Glaucoma Son    Glaucoma Maternal Grandfather    Cancer Maternal Grandfather        poss leukemia   SIDS Brother    Hyperthyroidism Daughter    Seizures Son        as a Development worker, international aid   Kidney disease Maternal Grandmother    Heart disease Maternal Grandmother        Visual merchandiser   Leukemia Maternal Grandmother    Colon cancer Neg Hx     Social History   Socioeconomic History   Marital status: Significant Other    Spouse name: Not on file   Number of children: 3   Years of education: 12   Highest  education level: 12th grade  Occupational History   Occupation: UNEMPLOYED 2011    Comment: disability    Employer: DISABLED  Tobacco Use   Smoking status: Former    Current packs/day: 0.00    Average packs/day: 3.0 packs/day for 2.0 years (6.0 ttl pk-yrs)    Types: Cigarettes    Start date: 05/11/1989    Quit date: 05/12/1991    Years since quitting: 32.7   Smokeless tobacco: Never  Vaping Use   Vaping status: Never  Used  Substance and Sexual Activity   Alcohol use: Yes    Comment: occasionally   Drug use: Never   Sexual activity: Yes    Birth control/protection: Surgical  Other Topics Concern   Not on file  Social History Narrative   Lives with son, Viviann Spare. 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   caffien- coffee, 1 cup daily   Social Drivers of Health   Financial Resource Strain: Low Risk  (10/25/2023)   Overall Financial Resource Strain (CARDIA)    Difficulty of Paying Living Expenses: Not hard at all  Food Insecurity: No Food Insecurity (10/25/2023)   Hunger Vital Sign    Worried About Running Out of Food in the Last Year: Never true    Ran Out of Food in the Last Year: Never true  Transportation Needs: No Transportation Needs (10/25/2023)   PRAPARE - Administrator, Civil Service (Medical): No    Lack of Transportation (Non-Medical): No  Physical Activity: Insufficiently Active (10/25/2023)   Exercise Vital Sign    Days of Exercise per Week: 3 days    Minutes of Exercise per Session: 20 min  Stress: Stress Concern Present (10/25/2023)   [employer] of Occupational Health - Occupational Stress Questionnaire    Feeling of Stress : To some extent  Social Connections: Moderately Isolated (10/25/2023)   Social Connection and Isolation Panel [NHANES]    Frequency of Communication with Friends and Family: Three times a week    Frequency of Social Gatherings with Friends and Family: Never    Attends Religious Services: More than 4 times per year    Active Member of [employer]  or Organizations: No    Attends Banker Meetings: Never    Marital Status: Divorced  Catering manager Violence: Not At Risk (10/25/2023)   Humiliation, Afraid, Rape, and Kick questionnaire    Fear of Current or Ex-Partner: No    Emotionally Abused: No    Physically Abused: No    Sexually Abused: No     Review of Systems   Gen: Denies any fever, chills, fatigue, weight loss, lack of appetite.  CV: Denies chest pain, heart palpitations, peripheral edema, syncope.  Resp: Denies shortness of breath at rest or with exertion. Denies wheezing or cough.  GI: Denies dysphagia or odynophagia. Denies jaundice, hematemesis, fecal incontinence. GU : Denies urinary burning, urinary frequency, urinary hesitancy MS: Denies joint pain, muscle weakness, cramps, or limitation of movement.  Derm: Denies rash, itching, dry skin Psych: Denies depression, anxiety, memory loss, and confusion Heme: Denies bruising, bleeding, and enlarged lymph nodes.   Physical Exam   BP 125/82 (BP Location: Left Arm, Patient Position: Sitting, Cuff Size: Normal)   Pulse 84   Temp (!) 97.1 F (36.2 C) (Temporal)   Ht 5\' 1"  (1.549 m)   Wt 119 lb 8 oz (54.2 kg)   BMI 22.58 kg/m  General:   Alert and oriented. Pleasant and cooperative. Well-nourished and well-developed.  Head:  Normocephalic and atraumatic. Eyes:  Without icterus Abdomen:  +BS, soft, non-tender and non-distended. No HSM noted. No guarding or rebound. No masses appreciated.  Rectal:  Deferred  Msk:  Symmetrical without gross deformities. Normal posture. Extremities:  Without edema. Neurologic:  Alert and  oriented x4;  grossly normal neurologically. Skin:  Intact without significant lesions or rashes. Psych:  Alert and cooperative. Normal mood and affect.   Assessment   Toni Miller is a 63 y.o. female presenting today with a history of constipation, bloating,  GERD, hemorrhoids s/p banding, returning for routine  follow-up.  Constipation: doing well with dietary measures.  Bloating: will implement low FODMAP trial again. She has noted improvement with this historically. No alarm signs/symptoms.   GERD: doing well on Nexium daily without alarm signs/symptoms.     PLAN    Continue Nexium daily Trial of low FODMAP diet Return in 6 months or sooner if needed   Gelene Mink, PhD, ANP-BC [facility]astroenter[facility]

## 2024-01-30 NOTE — Patient Instructions (Signed)
 If your symptoms worsen or you have thoughts of suicide/homicide, PLEASE SEEK IMMEDIATE MEDICAL ATTENTION.  You may always call:   National Suicide Hotline: 988 or (913)195-1115 [place] Crisis Line: 343-675-8477 Crisis Recovery in [place]: [phone]     These are available 24 hours a day, 7 days a week.

## 2024-02-07 ENCOUNTER — Telehealth: Payer: Self-pay | Admitting: Professional Counselor

## 2024-02-07 DIAGNOSIS — F3341 Major depressive disorder, recurrent, in partial remission: Secondary | ICD-10-CM
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
## 2024-02-09 ENCOUNTER — Ambulatory Visit

## 2024-02-09 ENCOUNTER — Ambulatory Visit: Admitting: Professional Counselor

## 2024-02-09 VITALS — BP 121/80 | HR 67 | Resp 18 | Ht 62.0 in | Wt 118.0 lb

## 2024-02-09 DIAGNOSIS — F3341 Major depressive disorder, recurrent, in partial remission: Secondary | ICD-10-CM

## 2024-02-09 DIAGNOSIS — S90862A Insect bite (nonvenomous), left foot, initial encounter: Secondary | ICD-10-CM

## 2024-02-09 DIAGNOSIS — S90861A Insect bite (nonvenomous), right foot, initial encounter: Secondary | ICD-10-CM

## 2024-02-09 DIAGNOSIS — W57XXXA Bitten or stung by nonvenomous insect and other nonvenomous arthropods, initial encounter: Secondary | ICD-10-CM | POA: Diagnosis not present

## 2024-02-09 MED ORDER — DOXYCYCLINE HYCLATE 100 MG PO CAPS: 100.0000 mg | ORAL_CAPSULE | Freq: Two times a day (BID) | ORAL | 0 refills | Status: AC

## 2024-02-09 NOTE — BH Specialist Note (Signed)
 [facility] Follow-up  MRN: [id number] NAME: Toni Miller Date: 02/09/24  Start time: Start Time: 1000 End time: Stop Time: 1030 Total time: Total Time in Minutes (Visit): 30 Call number: Visit Number: 3- Third Visit  Reason for call today:  The patient, a 63 year old female, presented for a collaborative care follow-up. During the psychiatric consultation, it was determined that she would benefit more from traditional trauma-informed therapy. As she is already connected to a psychiatrist, continued participation in collaborative care would not be appropriate. Her medications remain stable, but she has unresolved trauma that she wishes to work through. Given this, a referral to a traditional therapist was recommended. The patient agreed with this plan, and the behavioral health counselor placed the referral. With this next step in place, the patient will now be signed off from collaborative care.   PHQ-9 Scores:     02/09/2024   10:58 AM 01/26/2024   10:03 AM 01/11/2024    1:31 PM 10/25/2023    9:36 AM 08/23/2023    1:09 PM  Depression screen PHQ 2/9  Decreased Interest 1 2 2  0 0  Down, Depressed, Hopeless 0 2 1 0 1  PHQ - 2 Score 1 4 3  0 1  Altered sleeping 2 1 2 2 1   Tired, decreased energy 1 1 1 1  0  Change in appetite 2 0 2 0 0  Feeling bad or failure about yourself  1 1 2  0 0  Trouble concentrating 0 0 2 0 0  Moving slowly or fidgety/restless 1 0 0 0 0  Suicidal thoughts 1 1 0 0 0  PHQ-9 Score 9 8 12 3 2   Difficult doing work/chores Somewhat difficult Somewhat difficult Not difficult at all Not difficult at all Somewhat difficult   GAD-7 Scores:     02/09/2024   10:58 AM 01/26/2024   10:16 AM 01/11/2024    1:34 PM 08/23/2023    1:10 PM  GAD 7 : Generalized Anxiety Score  Nervous, Anxious, on Edge 1 2 2 1   Control/stop worrying 0 1 2 1   Worry too much - different things 1 2 2  0  Trouble relaxing 1 1 1  0  Restless 0 0 1 0  Easily annoyed or irritable 0 2 0 1  Afraid -  awful might happen 0 2 1 1   Total GAD 7 Score 3 10 9 4   Anxiety Difficulty Somewhat difficult Not difficult at all  Somewhat difficult    Stress Current stressors:  Guilt Sleep:  Fair Appetite:  Fair Coping ability:  Fair Patient taking medications as prescribed:  Yes  Current medications:  Outpatient Encounter Medications as of 02/09/2024  Medication Sig   cetirizine (ZYRTEC) 5 MG chewable tablet Chew 5 mg by mouth daily.   cholecalciferol (VITAMIN D3) 25 MCG (1000 UNIT) tablet Take 1,000 Units by mouth daily.   conjugated estrogens (PREMARIN) vaginal cream APPLY FINGERTIP AMOUNT VAGINALLY 3 TIMES WEEKLY AS NEEDED   cycloSPORINE (RESTASIS) 0.05 % ophthalmic emulsion Place 1 drop into both eyes 2 (two) times daily.   esomeprazole (NEXIUM) 40 MG capsule TAKE 1 CAPSULE BY MOUTH TWICE  DAILY   fluticasone (FLONASE) 50 MCG/ACT nasal spray Place 1 spray into both nostrils daily as needed for allergies or rhinitis.   gabapentin (NEURONTIN) 300 MG capsule TAKE 1 CAPSULE BY MOUTH TWICE  DAILY   montelukast (SINGULAIR) 10 MG tablet TAKE 1 TABLET BY MOUTH DAILY   Multiple Vitamin (MULTIVITAMIN) capsule Take 1 capsule by mouth daily.  rosuvastatin (CRESTOR) 10 MG tablet Take 1 tablet (10 mg total) by mouth at bedtime.   TRAVATAN Z 0.004 % SOLN ophthalmic solution Place 1 drop into both eyes at bedtime.   triamterene-hydrochlorothiazide (MAXZIDE) 75-50 MG tablet TAKE 1 TABLET BY MOUTH DAILY   venlafaxine XR (EFFEXOR-XR) 75 MG 24 hr capsule Take 1 capsule (75 mg total) by mouth daily with breakfast.   No facility-administered encounter medications on file as of 02/09/2024.     Self-harm Behaviors Risk Assessment Self-harm risk factors:  Past thoughts Patient endorses recent thoughts of harming self:  Denies   Danger to Others Risk Assessment Danger to others risk factors:  None Patient endorses recent thoughts of harming others:  Denies   Substance Use Assessment Patient recently consumed  alcohol:  None  Alcohol Use Disorder Identification Test (AUDIT):     09/02/2020    3:31 PM 09/07/2021   11:01 AM 10/25/2023    9:43 AM  Alcohol Use Disorder Test (AUDIT)  1. How often do you have a drink containing alcohol? 0 0 0  2. How many drinks containing alcohol do you have on a typical day when you are drinking? 0 0 0  3. How often do you have six or more drinks on one occasion? 0 0 0  AUDIT-C Score 0 0 0  Alcohol Brief Interventions/Follow-up Patient Refused      Goals, Interventions and Follow-up Plan Goals: Increase healthy adjustment to current life circumstances Interventions: Supportive Counseling Follow-up Plan: Refer to [facility] Outpatient Therapy    Reuel Boom

## 2024-02-09 NOTE — Progress Notes (Signed)
   Acute Office Visit  Subjective:     Patient ID: Toni Miller, female    DOB: 11-12-1960, 63 y.o.   MRN: [id number]  Chief Complaint  Patient presents with   Insect Bite    Pt presents with tick bite on her left foot since Friday. As well as a possible spider or tick bite on right calf. Noticed the bites x1 week ago, has not grown in size or signs of infection. Denies fever. Pt states it is very itchy.     HPI Patient is in today for tick bite on left foot.  Review of Systems  Constitutional: Negative.  Negative for chills and fever.  Respiratory: Negative.    Cardiovascular: Negative.   Skin:  Positive for itching (at site of tick bite on left foot).  Neurological: Negative.   Psychiatric/Behavioral: Negative.          Objective:    BP 121/80   Pulse 67   Resp 18   Ht 5\' 2"  (1.575 m)   Wt 118 lb (53.5 kg)   SpO2 97%   BMI 21.58 kg/m    Physical Exam Vitals and nursing note reviewed.  Constitutional:      Appearance: Normal appearance.  Skin:    Findings: Lesion (red, raised lesion on top of left foot) present.  Neurological:     Mental Status: She is alert.     No results found for any visits on 02/09/24.      Assessment & Plan:   Problem List Items Addressed This Visit   None Visit Diagnoses       Insect bite of right foot, initial encounter    -  Primary   Add oral doxy given known tick bite.  discussed photosensitivy associated with this antibiotic.  recommend f/u if lesion is not resolving over the next week.   Relevant Medications   doxycycline (VIBRAMYCIN) 100 MG capsule       Meds ordered this encounter  Medications   doxycycline (VIBRAMYCIN) 100 MG capsule    Sig: Take 1 capsule (100 mg total) by mouth 2 (two) times daily for 7 days.    Dispense:  14 capsule    Refill:  0    No follow-ups on file.  Darral Dash, FNP

## 2024-02-13 ENCOUNTER — Encounter: Payer: Self-pay | Admitting: Gastroenterology

## 2024-03-08 ENCOUNTER — Encounter: Payer: Self-pay | Admitting: Radiology

## 2024-03-26 ENCOUNTER — Ambulatory Visit (INDEPENDENT_AMBULATORY_CARE_PROVIDER_SITE_OTHER): Admitting: Family Medicine

## 2024-03-26 ENCOUNTER — Encounter: Payer: Self-pay | Admitting: Family Medicine

## 2024-03-26 VITALS — BP 114/77 | HR 79 | Resp 16 | Ht 61.0 in | Wt 117.0 lb

## 2024-03-26 DIAGNOSIS — F411 Generalized anxiety disorder: Secondary | ICD-10-CM | POA: Diagnosis not present

## 2024-03-26 DIAGNOSIS — I10 Essential (primary) hypertension: Secondary | ICD-10-CM

## 2024-03-26 DIAGNOSIS — E785 Hyperlipidemia, unspecified: Secondary | ICD-10-CM

## 2024-03-26 DIAGNOSIS — R200 Anesthesia of skin: Secondary | ICD-10-CM | POA: Diagnosis not present

## 2024-03-26 DIAGNOSIS — K219 Gastro-esophageal reflux disease without esophagitis: Secondary | ICD-10-CM | POA: Diagnosis not present

## 2024-03-26 DIAGNOSIS — F3341 Major depressive disorder, recurrent, in partial remission: Secondary | ICD-10-CM

## 2024-03-26 DIAGNOSIS — M545 Low back pain, unspecified: Secondary | ICD-10-CM | POA: Diagnosis not present

## 2024-03-26 DIAGNOSIS — R7989 Other specified abnormal findings of blood chemistry: Secondary | ICD-10-CM | POA: Insufficient documentation

## 2024-03-26 DIAGNOSIS — M549 Dorsalgia, unspecified: Secondary | ICD-10-CM | POA: Insufficient documentation

## 2024-03-26 DIAGNOSIS — R202 Paresthesia of skin: Secondary | ICD-10-CM

## 2024-03-26 MED ORDER — MELOXICAM 7.5 MG PO TABS
7.5000 mg | ORAL_TABLET | Freq: Every day | ORAL | 0 refills | Status: DC
Start: 2024-03-26 — End: 2024-04-13

## 2024-03-26 MED ORDER — CYCLOBENZAPRINE HCL 5 MG PO TABS: 5.0000 mg | ORAL_TABLET | Freq: Every day | ORAL | 0 refills | Status: DC

## 2024-03-26 NOTE — Assessment & Plan Note (Signed)
 Controlled, no change in medication

## 2024-03-26 NOTE — Assessment & Plan Note (Signed)
 1 year history, reports worsening , has seen Ortho in the past for this and benefitted from injection, will refer back

## 2024-03-26 NOTE — Assessment & Plan Note (Signed)
 Advised to reduce MVI to once weekly, likely does not need this at all

## 2024-03-26 NOTE — Patient Instructions (Addendum)
 Annual exam 10/17 or after, call if you need me sooner  Fasting LABS DFIRST WEEK IN AUGUST  Meloxicam  and anti inflammatory, and cyclobenzaprine  a muscle relaxant are prescribed for back pain  You are referred to dr Murrel Arnt re numbness and tingling in left hand

## 2024-03-26 NOTE — Assessment & Plan Note (Signed)
 Controlled, no change in medication DASH diet and commitment to daily physical activity for a minimum of 30 minutes discussed and encouraged, as a part of hypertension management. The importance of attaining a healthy weight is also discussed.     03/26/2024   12:59 PM 02/09/2024   11:29 AM 01/30/2024    1:39 PM 01/11/2024    1:37 PM 01/11/2024    1:09 PM 12/13/2023    9:53 AM 12/03/2023    7:29 AM  BP/Weight  Systolic BP 114 121 125 120 133    Diastolic BP 77 80 82 80 86    Wt. (Lbs) 117.04 118 119.5  119.12 120 121  BMI 22.11 kg/m2 21.58 kg/m2 22.58 kg/m2  22.51 kg/m2 22.67 kg/m2 22.86 kg/m2

## 2024-03-26 NOTE — Assessment & Plan Note (Signed)
 2 week history, thinks aggravated by sitting on the floor, noin radiaiting no red flags, meloxicam  and flexeril  x 10 days

## 2024-03-26 NOTE — Assessment & Plan Note (Signed)
 Hyperlipidemia:Low fat diet discussed and encouraged.   Lipid Panel  Lab Results  Component Value Date   CHOL 185 10/19/2023   HDL 73 10/19/2023   LDLCALC 95 10/19/2023   TRIG 95 10/19/2023   CHOLHDL 2.5 10/19/2023     Updated lab needed at/ before next visit.

## 2024-03-26 NOTE — Assessment & Plan Note (Signed)
 Controlled, no change in medication Starting with new therapist

## 2024-03-26 NOTE — Progress Notes (Signed)
 Toni Miller     MRN: [id number]      DOB: [birth year]  Chief Complaint  Patient presents with   Medical Management of Chronic Issues    10 week follow up    Back Pain    Complains of lower back pain after sitting for long periods for last couple weeks     HPI Toni Miller is here for follow up and re-evaluation of chronic medical conditions, medication management and review of any available recent lab and radiology data.  Preventive health is updated, specifically  Cancer screening and Immunization.   Questions or concerns regarding consultations or procedures which the PT has had in the interim are  addressed.Seeing new therapist and declines psych states she remains stable on current meds and was just having a bad day, not suicidal or homicidal concern re 2 week h/o back pain and 1 year h/o progressive numbness and tingling left hand States Ortho in the past mentioned carpal tunnel and also that symptoms may be from C spine and may need to see Specialist [place], had intra articular injection in the past in left shoulder which helped at the time ROS Denies recent fever or chills. Denies sinus pressure, nasal congestion, ear pain or sore throat. Denies chest congestion, productive cough or wheezing. Denies chest pains, palpitations and leg swelling Denies abdominal pain, nausea, vomiting,diarrhea or constipation.   Denies dysuria, frequency, hesitancy or incontinence.  Denies skin break down or rash.   PE  BP 114/77   Pulse 79   Resp 16   Ht 5\' 1"  (1.549 m)   Wt 117 lb 0.6 oz (53.1 kg)   SpO2 98%   BMI 22.11 kg/m   Patient alert and oriented and in no cardiopulmonary distress.  HEENT: No facial asymmetry, EOMI,     Neck supple .  Chest: Clear to auscultation bilaterally.  CVS: S1, S2 no murmurs, no S3.Regular rate.  ABD: Soft non tender.   Ext: No edema  MS: Adequate ROM spine, shoulders, hips and knees.  Skin: Intact, no ulcerations or rash noted.  Psych:  Good eye contact, normal affect. Memory intact not anxious or depressed appearing.  CNS: CN 2-12 intact, power,  normal throughout.no focal deficits noted.   Assessment & Plan  Back pain 2 week history, thinks aggravated by sitting on the floor, noin radiaiting no red flags, meloxicam  and flexeril  x 10 days  Hypertension Controlled, no change in medication DASH diet and commitment to daily physical activity for a minimum of 30 minutes discussed and encouraged, as a part of hypertension management. The importance of attaining a healthy weight is also discussed.     03/26/2024   12:59 PM 02/09/2024   11:29 AM 01/30/2024    1:39 PM 01/11/2024    1:37 PM 01/11/2024    1:09 PM 12/13/2023    9:53 AM 12/03/2023    7:29 AM  BP/Weight  Systolic BP 114 121 125 120 133    Diastolic BP 77 80 82 80 86    Wt. (Lbs) 117.04 118 119.5  119.12 120 121  BMI 22.11 kg/m2 21.58 kg/m2 22.58 kg/m2  22.51 kg/m2 22.67 kg/m2 22.86 kg/m2       Hyperlipidemia with target LDL less than 100 Hyperlipidemia:Low fat diet discussed and encouraged.   Lipid Panel  Lab Results  Component Value Date   CHOL 185 10/19/2023   HDL 73 10/19/2023   LDLCALC 95 10/19/2023   TRIG 95 10/19/2023   CHOLHDL 2.5 10/19/2023  Updated lab needed at/ before next visit.   GAD (generalized anxiety disorder) Controlled, no change in medication   Depression, major, recurrent, in partial remission (HCC) Controlled, no change in medication Starting with new therapist  GERD Controlled, no change in medication   High serum vitamin B12 Advised to reduce MVI to once weekly, likely does not need this at all  Numbness and tingling in left hand 1 year history, reports worsening , has seen Ortho in the past for this and benefitted from injection, will refer back

## 2024-03-29 ENCOUNTER — Other Ambulatory Visit: Payer: Self-pay | Admitting: Family Medicine

## 2024-03-29 MED ORDER — ESOMEPRAZOLE MAGNESIUM 40 MG PO CPDR: DELAYED_RELEASE_CAPSULE | ORAL | 11 refills | Status: DC

## 2024-03-29 MED ORDER — MONTELUKAST SODIUM 10 MG PO TABS: 10.0000 mg | ORAL_TABLET | Freq: Every day | ORAL | 3 refills | Status: DC

## 2024-03-29 MED ORDER — TRIAMTERENE-HCTZ 75-50 MG PO TABS: 1.0000 | ORAL_TABLET | Freq: Every day | ORAL | 2 refills | Status: DC

## 2024-03-29 MED ORDER — TRAVATAN Z 0.004 % OP SOLN: 1.0000 [drp] | Freq: Every day | OPHTHALMIC | Status: AC

## 2024-03-29 MED ORDER — GABAPENTIN 300 MG PO CAPS: 300.0000 mg | ORAL_CAPSULE | Freq: Two times a day (BID) | ORAL | 11 refills | Status: DC

## 2024-03-29 MED ORDER — VENLAFAXINE HCL ER 75 MG PO CP24: 75.0000 mg | ORAL_CAPSULE | Freq: Every day | ORAL | 3 refills | Status: DC

## 2024-03-29 NOTE — Telephone Encounter (Signed)
 Copied from CRM [id number]. Topic: Clinical - Medication Question >> Mar 29, 2024  9:28 AM Sanjuana Crutch wrote: Reason for CRM: [facility] called for this patient to have some medications sent over to address [address] ,[place]  [phone].  The medications are as follows : TRAVATAN  Z 0.004 % SOLN ophthalmic solution montelukast  (SINGULAIR ) 10 MG tablet esomeprazole  (NEXIUM ) 40 MG capsule triamterene -hydrochlorothiazide (MAXZIDE) 75-50 MG tablet venlafaxine  XR (EFFEXOR -XR) 75 MG 24 hr capsule gabapentin  (NEURONTIN ) 300 MG capsule

## 2024-04-05 ENCOUNTER — Other Ambulatory Visit: Payer: Self-pay | Admitting: Family Medicine

## 2024-04-10 ENCOUNTER — Emergency Department (HOSPITAL_COMMUNITY)

## 2024-04-10 ENCOUNTER — Telehealth: Payer: No Typology Code available for payment source | Admitting: Psychiatry

## 2024-04-10 ENCOUNTER — Encounter (HOSPITAL_COMMUNITY): Payer: Self-pay | Admitting: *Deleted

## 2024-04-10 ENCOUNTER — Other Ambulatory Visit: Payer: Self-pay

## 2024-04-10 ENCOUNTER — Inpatient Hospital Stay (HOSPITAL_COMMUNITY)
Admission: EM | Admit: 2024-04-10 | Discharge: 2024-04-13 | DRG: 872 | Disposition: A | Discharge status: Home / Self Care | Attending: Family Medicine | Admitting: Family Medicine

## 2024-04-10 DIAGNOSIS — R519 Headache, unspecified: Secondary | ICD-10-CM | POA: Diagnosis present

## 2024-04-10 DIAGNOSIS — Z1152 Encounter for screening for COVID-19: Secondary | ICD-10-CM

## 2024-04-10 DIAGNOSIS — I10 Essential (primary) hypertension: Secondary | ICD-10-CM | POA: Diagnosis present

## 2024-04-10 DIAGNOSIS — Z90710 Acquired absence of both cervix and uterus: Secondary | ICD-10-CM

## 2024-04-10 DIAGNOSIS — Y92009 Unspecified place in unspecified non-institutional (private) residence as the place of occurrence of the external cause: Secondary | ICD-10-CM

## 2024-04-10 DIAGNOSIS — Z791 Long term (current) use of non-steroidal anti-inflammatories (NSAID): Secondary | ICD-10-CM

## 2024-04-10 DIAGNOSIS — E876 Hypokalemia: Secondary | ICD-10-CM | POA: Diagnosis present

## 2024-04-10 DIAGNOSIS — Z800 Family history of malignant neoplasm of digestive organs: Secondary | ICD-10-CM | POA: Diagnosis not present

## 2024-04-10 DIAGNOSIS — A4151 Sepsis due to Escherichia coli [E. coli]: Principal | ICD-10-CM | POA: Diagnosis present

## 2024-04-10 DIAGNOSIS — K5909 Other constipation: Secondary | ICD-10-CM | POA: Diagnosis not present

## 2024-04-10 DIAGNOSIS — Z806 Family history of leukemia: Secondary | ICD-10-CM | POA: Diagnosis not present

## 2024-04-10 DIAGNOSIS — N959 Unspecified menopausal and perimenopausal disorder: Secondary | ICD-10-CM | POA: Diagnosis present

## 2024-04-10 DIAGNOSIS — N390 Urinary tract infection, site not specified: Principal | ICD-10-CM | POA: Diagnosis present

## 2024-04-10 DIAGNOSIS — J4520 Mild intermittent asthma, uncomplicated: Secondary | ICD-10-CM | POA: Diagnosis present

## 2024-04-10 DIAGNOSIS — W19XXXA Unspecified fall, initial encounter: Secondary | ICD-10-CM | POA: Diagnosis present

## 2024-04-10 DIAGNOSIS — B9620 Unspecified Escherichia coli [E. coli] as the cause of diseases classified elsewhere: Secondary | ICD-10-CM | POA: Diagnosis not present

## 2024-04-10 DIAGNOSIS — K219 Gastro-esophageal reflux disease without esophagitis: Secondary | ICD-10-CM | POA: Diagnosis not present

## 2024-04-10 DIAGNOSIS — H409 Unspecified glaucoma: Secondary | ICD-10-CM | POA: Diagnosis present

## 2024-04-10 DIAGNOSIS — E785 Hyperlipidemia, unspecified: Secondary | ICD-10-CM | POA: Diagnosis present

## 2024-04-10 DIAGNOSIS — R509 Fever, unspecified: Secondary | ICD-10-CM | POA: Diagnosis present

## 2024-04-10 DIAGNOSIS — R0902 Hypoxemia: Secondary | ICD-10-CM | POA: Diagnosis not present

## 2024-04-10 DIAGNOSIS — Z9851 Tubal ligation status: Secondary | ICD-10-CM

## 2024-04-10 DIAGNOSIS — N179 Acute kidney failure, unspecified: Secondary | ICD-10-CM | POA: Diagnosis not present

## 2024-04-10 DIAGNOSIS — E871 Hypo-osmolality and hyponatremia: Secondary | ICD-10-CM | POA: Diagnosis not present

## 2024-04-10 DIAGNOSIS — I959 Hypotension, unspecified: Secondary | ICD-10-CM | POA: Diagnosis present

## 2024-04-10 DIAGNOSIS — F32A Depression, unspecified: Secondary | ICD-10-CM | POA: Diagnosis present

## 2024-04-10 DIAGNOSIS — Z803 Family history of malignant neoplasm of breast: Secondary | ICD-10-CM

## 2024-04-10 DIAGNOSIS — Z888 Allergy status to other drugs, medicaments and biological substances status: Secondary | ICD-10-CM

## 2024-04-10 DIAGNOSIS — Z79899 Other long term (current) drug therapy: Secondary | ICD-10-CM

## 2024-04-10 DIAGNOSIS — R7881 Bacteremia: Secondary | ICD-10-CM | POA: Diagnosis present

## 2024-04-10 DIAGNOSIS — Z8249 Family history of ischemic heart disease and other diseases of the circulatory system: Secondary | ICD-10-CM

## 2024-04-10 DIAGNOSIS — G930 Cerebral cysts: Secondary | ICD-10-CM | POA: Diagnosis not present

## 2024-04-10 DIAGNOSIS — Z860100 Personal history of colon polyps, unspecified: Secondary | ICD-10-CM

## 2024-04-10 DIAGNOSIS — Z87891 Personal history of nicotine dependence: Secondary | ICD-10-CM | POA: Diagnosis not present

## 2024-04-10 DIAGNOSIS — F411 Generalized anxiety disorder: Secondary | ICD-10-CM | POA: Diagnosis present

## 2024-04-10 DIAGNOSIS — Z801 Family history of malignant neoplasm of trachea, bronchus and lung: Secondary | ICD-10-CM

## 2024-04-10 DIAGNOSIS — E860 Dehydration: Secondary | ICD-10-CM | POA: Diagnosis not present

## 2024-04-10 DIAGNOSIS — Z90722 Acquired absence of ovaries, bilateral: Secondary | ICD-10-CM

## 2024-04-10 DIAGNOSIS — R000 Tachycardia, unspecified: Secondary | ICD-10-CM | POA: Diagnosis not present

## 2024-04-10 DIAGNOSIS — G4489 Other headache syndrome: Secondary | ICD-10-CM | POA: Diagnosis not present

## 2024-04-10 DIAGNOSIS — R7401 Elevation of levels of liver transaminase levels: Secondary | ICD-10-CM | POA: Diagnosis not present

## 2024-04-10 DIAGNOSIS — Z811 Family history of alcohol abuse and dependence: Secondary | ICD-10-CM

## 2024-04-10 DIAGNOSIS — F419 Anxiety disorder, unspecified: Secondary | ICD-10-CM | POA: Diagnosis present

## 2024-04-10 DIAGNOSIS — Z83511 Family history of glaucoma: Secondary | ICD-10-CM

## 2024-04-10 DIAGNOSIS — K5900 Constipation, unspecified: Secondary | ICD-10-CM | POA: Diagnosis present

## 2024-04-10 HISTORY — DX: Mononeuropathy, unspecified: G58.9

## 2024-04-10 LAB — URINALYSIS, W/ REFLEX TO CULTURE (INFECTION SUSPECTED)
Bilirubin Urine: NEGATIVE
Glucose, UA: NEGATIVE mg/dL
Ketones, ur: NEGATIVE mg/dL
Nitrite: NEGATIVE
Protein, ur: 100 mg/dL — AB
Specific Gravity, Urine: 1.013 (ref 1.005–1.030)
pH: 5 (ref 5.0–8.0)

## 2024-04-10 LAB — RESP PANEL BY RT-PCR (RSV, FLU A&B, COVID)  RVPGX2
Influenza A by PCR: NEGATIVE
Influenza B by PCR: NEGATIVE
Resp Syncytial Virus by PCR: NEGATIVE
SARS Coronavirus 2 by RT PCR: NEGATIVE

## 2024-04-10 LAB — CBC WITH DIFFERENTIAL/PLATELET
Abs Immature Granulocytes: 0.15 10*3/uL — ABNORMAL HIGH (ref 0.00–0.07)
Basophils Absolute: 0.1 10*3/uL (ref 0.0–0.1)
Basophils Relative: 0 %
Eosinophils Absolute: 0 10*3/uL (ref 0.0–0.5)
Eosinophils Relative: 0 %
HCT: 34.3 % — ABNORMAL LOW (ref 36.0–46.0)
Hemoglobin: 12.2 g/dL (ref 12.0–15.0)
Immature Granulocytes: 1 %
Lymphocytes Relative: 6 %
Lymphs Abs: 0.9 10*3/uL (ref 0.7–4.0)
MCH: 30.8 pg (ref 26.0–34.0)
MCHC: 35.6 g/dL (ref 30.0–36.0)
MCV: 86.6 fL (ref 80.0–100.0)
Monocytes Absolute: 1.7 10*3/uL — ABNORMAL HIGH (ref 0.1–1.0)
Monocytes Relative: 11 %
Neutro Abs: 12.8 10*3/uL — ABNORMAL HIGH (ref 1.7–7.7)
Neutrophils Relative %: 82 %
Platelets: 181 10*3/uL (ref 150–400)
RBC: 3.96 MIL/uL (ref 3.87–5.11)
RDW: 12.7 % (ref 11.5–15.5)
WBC: 15.6 10*3/uL — ABNORMAL HIGH (ref 4.0–10.5)
nRBC: 0 % (ref 0.0–0.2)

## 2024-04-10 LAB — COMPREHENSIVE METABOLIC PANEL WITH GFR
ALT: 61 U/L — ABNORMAL HIGH (ref 0–44)
AST: 59 U/L — ABNORMAL HIGH (ref 15–41)
Albumin: 3.4 g/dL — ABNORMAL LOW (ref 3.5–5.0)
Alkaline Phosphatase: 82 U/L (ref 38–126)
Anion gap: 11 (ref 5–15)
BUN: 19 mg/dL (ref 8–23)
CO2: 24 mmol/L (ref 22–32)
Calcium: 8.3 mg/dL — ABNORMAL LOW (ref 8.9–10.3)
Chloride: 96 mmol/L — ABNORMAL LOW (ref 98–111)
Creatinine, Ser: 1.49 mg/dL — ABNORMAL HIGH (ref 0.44–1.00)
GFR, Estimated: 40 mL/min — ABNORMAL LOW (ref >60)
Glucose, Bld: 130 mg/dL — ABNORMAL HIGH (ref 70–99)
Potassium: 2.6 mmol/L — CL (ref 3.5–5.1)
Sodium: 131 mmol/L — ABNORMAL LOW (ref 135–145)
Total Bilirubin: 1.4 mg/dL — ABNORMAL HIGH (ref 0.0–1.2)
Total Protein: 7.3 g/dL (ref 6.5–8.1)

## 2024-04-10 LAB — MAGNESIUM: Magnesium: 2.1 mg/dL (ref 1.7–2.4)

## 2024-04-10 LAB — HEPATITIS PANEL, ACUTE
HCV Ab: NONREACTIVE
Hep A IgM: NONREACTIVE
Hep B C IgM: NONREACTIVE
Hepatitis B Surface Ag: NONREACTIVE

## 2024-04-10 LAB — PROTIME-INR
INR: 1.3 — ABNORMAL HIGH (ref 0.8–1.2)
Prothrombin Time: 16.3 s — ABNORMAL HIGH (ref 11.4–15.2)

## 2024-04-10 LAB — GAMMA GT: GGT: 41 U/L (ref 7–50)

## 2024-04-10 LAB — LACTIC ACID, PLASMA: Lactic Acid, Venous: 1.2 mmol/L (ref 0.5–1.9)

## 2024-04-10 MED ORDER — DOXYCYCLINE HYCLATE 100 MG PO TABS
100.0000 mg | ORAL_TABLET | Freq: Two times a day (BID) | ORAL | Status: DC
  Administered 2024-04-10 – 2024-04-12 (×6): 100 mg via ORAL
  Filled 2024-04-10 (×6): qty 1

## 2024-04-10 MED ORDER — ACETAMINOPHEN 325 MG PO TABS
650.0000 mg | ORAL_TABLET | Freq: Four times a day (QID) | ORAL | Status: DC
  Administered 2024-04-10 – 2024-04-13 (×11): 650 mg via ORAL
  Filled 2024-04-10 (×11): qty 2

## 2024-04-10 MED ORDER — LATANOPROST 0.005 % OP SOLN
1.0000 [drp] | Freq: Every day | OPHTHALMIC | Status: DC
  Administered 2024-04-10 – 2024-04-12 (×3): 1 [drp] via OPHTHALMIC
  Filled 2024-04-10 (×2): qty 2.5

## 2024-04-10 MED ORDER — FENTANYL CITRATE PF 50 MCG/ML IJ SOSY: 12.5000 ug | PREFILLED_SYRINGE | INTRAMUSCULAR | Status: DC | PRN

## 2024-04-10 MED ORDER — LACTATED RINGERS IV BOLUS
1000.0000 mL | Freq: Once | INTRAVENOUS | Status: AC
  Administered 2024-04-10: 1000 mL via INTRAVENOUS

## 2024-04-10 MED ORDER — VENLAFAXINE HCL ER 75 MG PO CP24
75.0000 mg | ORAL_CAPSULE | Freq: Every day | ORAL | Status: DC
  Administered 2024-04-11 – 2024-04-13 (×3): 75 mg via ORAL
  Filled 2024-04-10 (×3): qty 1

## 2024-04-10 MED ORDER — ACETAMINOPHEN 650 MG RE SUPP: 650.0000 mg | Freq: Four times a day (QID) | RECTAL | Status: DC

## 2024-04-10 MED ORDER — PROCHLORPERAZINE EDISYLATE 10 MG/2ML IJ SOLN: 10.0000 mg | INTRAMUSCULAR | Status: DC | PRN

## 2024-04-10 MED ORDER — CYCLOSPORINE 0.05 % OP EMUL
1.0000 [drp] | Freq: Two times a day (BID) | OPHTHALMIC | Status: DC
  Administered 2024-04-10 – 2024-04-13 (×6): 1 [drp] via OPHTHALMIC
  Filled 2024-04-10 (×6): qty 30

## 2024-04-10 MED ORDER — ONDANSETRON HCL 4 MG/2ML IJ SOLN
4.0000 mg | Freq: Four times a day (QID) | INTRAMUSCULAR | Status: DC | PRN
Start: 2024-04-10 — End: 2024-04-13

## 2024-04-10 MED ORDER — ACETAMINOPHEN-CAFFEINE 500-65 MG PO TABS
2.0000 | ORAL_TABLET | Freq: Once | ORAL | Status: DC
  Filled 2024-04-10: qty 2

## 2024-04-10 MED ORDER — SODIUM CHLORIDE 0.9 % IV SOLN: 2.0000 g | INTRAVENOUS | Status: DC

## 2024-04-10 MED ORDER — MONTELUKAST SODIUM 10 MG PO TABS
10.0000 mg | ORAL_TABLET | Freq: Every day | ORAL | Status: DC
  Administered 2024-04-10 – 2024-04-12 (×3): 10 mg via ORAL
  Filled 2024-04-10 (×3): qty 1

## 2024-04-10 MED ORDER — SODIUM CHLORIDE 0.9 % IV BOLUS
1000.0000 mL | Freq: Once | INTRAVENOUS | Status: AC
  Administered 2024-04-10: 1000 mL via INTRAVENOUS

## 2024-04-10 MED ORDER — OXYCODONE HCL 5 MG PO TABS
5.0000 mg | ORAL_TABLET | Freq: Four times a day (QID) | ORAL | Status: DC | PRN
  Administered 2024-04-12: 5 mg via ORAL
  Filled 2024-04-10 (×2): qty 1

## 2024-04-10 MED ORDER — GABAPENTIN 100 MG PO CAPS
200.0000 mg | ORAL_CAPSULE | Freq: Every day | ORAL | Status: DC
  Administered 2024-04-10 – 2024-04-12 (×3): 200 mg via ORAL
  Filled 2024-04-10 (×3): qty 2

## 2024-04-10 MED ORDER — POTASSIUM CHLORIDE 10 MEQ/100ML IV SOLN
10.0000 meq | Freq: Once | INTRAVENOUS | Status: AC
  Administered 2024-04-10: 10 meq via INTRAVENOUS
  Filled 2024-04-10: qty 100

## 2024-04-10 MED ORDER — POTASSIUM CHLORIDE CRYS ER 20 MEQ PO TBCR
40.0000 meq | EXTENDED_RELEASE_TABLET | Freq: Once | ORAL | Status: AC
  Administered 2024-04-10: 40 meq via ORAL
  Filled 2024-04-10: qty 2

## 2024-04-10 MED ORDER — ROSUVASTATIN CALCIUM 10 MG PO TABS
10.0000 mg | ORAL_TABLET | Freq: Every day | ORAL | Status: DC
  Administered 2024-04-10 – 2024-04-12 (×3): 10 mg via ORAL
  Filled 2024-04-10 (×3): qty 1

## 2024-04-10 MED ORDER — METOCLOPRAMIDE HCL 5 MG/ML IJ SOLN
10.0000 mg | Freq: Once | INTRAMUSCULAR | Status: AC
  Administered 2024-04-10: 10 mg via INTRAVENOUS
  Filled 2024-04-10: qty 2

## 2024-04-10 MED ORDER — ONDANSETRON HCL 4 MG PO TABS: 4.0000 mg | ORAL_TABLET | Freq: Four times a day (QID) | ORAL | Status: DC | PRN

## 2024-04-10 MED ORDER — PANTOPRAZOLE SODIUM 40 MG PO TBEC
40.0000 mg | DELAYED_RELEASE_TABLET | Freq: Two times a day (BID) | ORAL | Status: DC
  Administered 2024-04-10 – 2024-04-13 (×6): 40 mg via ORAL
  Filled 2024-04-10 (×6): qty 1

## 2024-04-10 MED ORDER — HEPARIN SODIUM (PORCINE) 5000 UNIT/ML IJ SOLN
5000.0000 [IU] | Freq: Three times a day (TID) | INTRAMUSCULAR | Status: AC
  Administered 2024-04-10 – 2024-04-11 (×4): 5000 [IU] via SUBCUTANEOUS
  Filled 2024-04-10 (×4): qty 1

## 2024-04-10 MED ORDER — LACTATED RINGERS IV SOLN: INTRAVENOUS | Status: AC

## 2024-04-10 MED ORDER — SODIUM CHLORIDE 0.9 % IV SOLN
2.0000 g | Freq: Once | INTRAVENOUS | Status: AC
  Administered 2024-04-10: 2 g via INTRAVENOUS
  Filled 2024-04-10: qty 20

## 2024-04-10 MED ORDER — METHOCARBAMOL 1000 MG/10ML IJ SOLN
500.0000 mg | Freq: Four times a day (QID) | INTRAMUSCULAR | Status: DC | PRN
  Filled 2024-04-10: qty 10

## 2024-04-10 MED ORDER — BISACODYL 5 MG PO TBEC
5.0000 mg | DELAYED_RELEASE_TABLET | Freq: Every day | ORAL | Status: DC | PRN
Start: 2024-04-10 — End: 2024-04-13

## 2024-04-10 NOTE — ED Notes (Signed)
 Patient transported to CT

## 2024-04-10 NOTE — ED Notes (Signed)
 Pt walked to the bathroom and urinated.

## 2024-04-10 NOTE — ED Triage Notes (Signed)
 Pt brought in by [facility] from home with c/o headache, generalized weakness, diarrhea, and urinary incontinence x 3 days. Upon EMS arrival pt was found to have a temperature of 103. Tylenol  1gm PO was given by EMS at 0855. EMS reports - HR 118, BP 107/58, CBG 156, ST on cardiac monitor. Pt's family reported to EMS that pt has some cognitive issues with short term memory loss, but no true diagnosis. EMS reports pt said she had just ate breakfast at 1000 this morning when they arrived, but the son reported she last ate last night.

## 2024-04-10 NOTE — Progress Notes (Signed)
   04/10/24 2149  TOC Brief Assessment  Insurance and Status Reviewed  Patient has primary care physician Yes  Home environment has been reviewed From home  Prior level of function: Independent  Prior/Current Home Services No current home services  Social Drivers of Health Review SDOH reviewed no interventions necessary  Readmission risk has been reviewed Yes  Transition of care needs no transition of care needs at this time   Transition of Care Department [facility]) has reviewed patient and no TOC needs have been identified at this time. We will continue to monitor patient advancement through interdisciplinary progression rounds. If new patient transition needs arise, please place a TOC consult.

## 2024-04-10 NOTE — H&P (Addendum)
 History and Physical  [facility]  Toni Miller:[id number] DOB: 05/10/1961 DOA: 04/10/2024  PCP: Towanda Fret, MD  Patient coming from: Home  Level of care: Telemetry  I have personally briefly reviewed patient's old medical records in [facility]  Chief Complaint: headache   HPI: Toni Miller is a 63 year old female with history of migraine headaches, GAD, GERD, chronic constipation, glaucoma, postmenopausal hot flashes, hyperlipidemia, chronic low back pain with sciatica, mild intermittent asthma presented to the ED by EMS complaining of an occipital headache that has been present for the last 3 days and associated with fever up to 103 degrees.  She reports that EMS found her to be febrile with temp of 103 and she was mildly hypotensive and tachycardic.  She reported that son removed a tick from her left foot about 1 month ago and the tick had been present for about 3 days.  She also reports foul smell coming from urine and she had had some increased urinary frequency and nocturia for past 4-5 days.  The ED provider had recommended an LP to rule out meningitis however patient and family refused.  She is being admitted for further observation and management.    Past Medical History:  Diagnosis Date   Anxiety    Arthritis    Cancer (HCC) 1993   abnormal pap treated at [facility]   Constipation    Depression    GERD (gastroesophageal reflux disease)    Glaucoma 1993   History of kidney stones    Hot flashes, menopausal 06/12/2011   Hyperlipidemia    Hypertension    Neck pain 05/2008   WITH BULGING DISC-----RECIEVING EPIDURALS    Pinched nerve    back   Sinusitis     Past Surgical History:  Procedure Laterality Date   ABDOMINAL EXPLORATION SURGERY  age 52   bowel obstruction, [facility]   ABDOMINAL HYSTERECTOMY  2007   [facility], [facility]   BILATERAL EYE SURGERY     for glaucoma   BIOPSY  01/09/2019   Procedure: BIOPSY;  Surgeon: Alyce Jubilee, MD;  Location: [facility];  Service: Endoscopy;;  gstric    bone removed from under tongue     BREAST BIOPSY Left    benign   BREAST CYST EXCISION Left 2004   BREAST SURGERY Left 2004   left partial mastectomy, [facility]   CATARACT EXTRACTION Right 06/04/2015   CATARACT EXTRACTION W/PHACO  05/16/2011   Procedure: CATARACT EXTRACTION PHACO AND INTRAOCULAR LENS PLACEMENT (IOC);  Surgeon: Anner Kill;  Location: [facility] ORS;  Service: Ophthalmology;  Laterality: Right;   COLONOSCOPY N/A 05/17/2013   Dr. Beverley Buck diverticulosis was noted/small internal hemorrhoids   COLONOSCOPY WITH PROPOFOL  N/A 04/12/2021   Colonoscopy June 2022: small anal sphincter s/p rectal surgery, internal hemorrhoids, one 2 mm polyp in cecum (benign). 10 year surveillance.   CYST REMOVED LEFT BREAST /BENIGN  2005   left, [facility]   ESOPHAGOGASTRODUODENOSCOPY N/A 01/09/2019   normal esophagus. Mild NSAID gastritis s/p biopsy. +H.pylori. treated with amoxicillin , biaxin , Nexium .. ERADICATION DOCUMENTED   EYE SURGERY  2010, 1992 approx   bilateral   POLYPECTOMY  04/12/2021   Procedure: POLYPECTOMY;  Surgeon: Vinetta Greening, DO;  Location: [facility];  Service: Endoscopy;;   REPAIR IMPERFORATE ANUS / ANORECTOPLASTY     per patient   TOTAL ABDOMINAL HYSTERECTOMY W/ BILATERAL SALPINGOOPHORECTOMY  2007   [facility], [facility]   TUBAL LIGATION  1991     reports that she  quit smoking about 32 years ago. Her smoking use included cigarettes. She started smoking about 34 years ago. She has a 6 pack-year smoking history. She has never used smokeless tobacco. She reports that she does not currently use alcohol. She reports that she does not use drugs.  Allergies  Allergen Reactions   Acyclovir  And Related Rash    Family History  Adopted: Yes  Problem Relation Age of Onset   Alcohol abuse Mother    Stomach cancer Mother 70   Lung cancer Father    Glaucoma Father    Alcohol abuse Father    Seizures Father    Breast cancer Sister 41   Breast cancer Sister  63   Heart disease Sister    Heart attack Sister    Glaucoma Son    Glaucoma Maternal Grandfather    Cancer Maternal Grandfather        poss leukemia   SIDS Brother    Hyperthyroidism Daughter    Seizures Son        as a Development worker, international aid   Kidney disease Maternal Grandmother    Heart disease Maternal Grandmother        pace maker   Leukemia Maternal Grandmother    Colon cancer Neg Hx     Prior to Admission medications   Medication Sig Start Date End Date Taking? Authorizing Provider  cholecalciferol (VITAMIN D3) 25 MCG (1000 UNIT) tablet Take 1,000 Units by mouth daily.   Yes [provider]  cyclobenzaprine  (FLEXERIL ) 5 MG tablet Take 1 tablet (5 mg total) by mouth at bedtime. 03/26/24  Yes Towanda Fret, MD  cycloSPORINE (RESTASIS) 0.05 % ophthalmic emulsion Place 1 drop into both eyes 2 (two) times daily.   Yes [provider]  esomeprazole  (NEXIUM ) 40 MG capsule TAKE 1 CAPSULE BY MOUTH TWICE  DAILY 03/29/24  Yes Towanda Fret, MD  fluticasone  (FLONASE ) 50 MCG/ACT nasal spray Place 1 spray into both nostrils daily as needed for allergies or rhinitis. 03/15/23  Yes Towanda Fret, MD  gabapentin  (NEURONTIN ) 300 MG capsule Take 1 capsule (300 mg total) by mouth 2 (two) times daily. 03/29/24  Yes Towanda Fret, MD  meloxicam  (MOBIC ) 7.5 MG tablet Take 1 tablet (7.5 mg total) by mouth daily. 03/26/24  Yes Towanda Fret, MD  montelukast  (SINGULAIR ) 10 MG tablet Take 1 tablet (10 mg total) by mouth daily. 03/29/24  Yes Towanda Fret, MD  Multiple Vitamin (MULTIVITAMIN) capsule Take 1 capsule by mouth daily.   Yes [provider]  PREMARIN  vaginal cream APPLY FINGERTIP AMOUNT VAGINALLY 3 TIMES WEEKLY AS NEEDED 04/05/24  Yes Towanda Fret, MD  rosuvastatin  (CRESTOR ) 10 MG tablet Take 1 tablet (10 mg total) by mouth at bedtime. 11/07/23  Yes Towanda Fret, MD  TRAVATAN  Z 0.004 % SOLN ophthalmic solution Place 1 drop into both eyes at  bedtime. 03/29/24  Yes Towanda Fret, MD  triamterene -hydrochlorothiazide (MAXZIDE) 75-50 MG tablet Take 1 tablet by mouth daily. 03/29/24  Yes Towanda Fret, MD  venlafaxine  XR (EFFEXOR -XR) 75 MG 24 hr capsule Take 1 capsule (75 mg total) by mouth daily with breakfast. 03/29/24 07/27/24 Yes Towanda Fret, MD    Physical Exam: Vitals:   04/10/24 1400 04/10/24 1500 04/10/24 1505 04/10/24 1509  BP: 112/78     Pulse: (!) 102 (!) 113  (!) 107  Resp: 15 20  (!) 21  Temp:   97.6 F (36.4 C)   TempSrc:   Oral  SpO2: 99% 96%  96%  Weight:      Height:        Constitutional: NAD, calm, comfortable Eyes: PERRL, lids and conjunctivae normal ENMT: Mucous membranes are moist. Posterior pharynx clear of any exudate or lesions.Normal dentition.  Neck: normal, supple, no masses, no thyromegaly. No meningeal signs or symptoms.   Respiratory: clear to auscultation bilaterally, no wheezing, no crackles. Normal respiratory effort. No accessory muscle use.  Cardiovascular: normal s1, s2 sounds, no murmurs / rubs / gallops. No extremity edema. 2+ pedal pulses. No carotid bruits.  Abdomen: no tenderness, no masses palpated. No hepatosplenomegaly. Bowel sounds positive.  Musculoskeletal: no clubbing / cyanosis. No joint deformity upper and lower extremities. Good ROM, no contractures. Normal muscle tone.  Skin: no rashes, lesions, ulcers. No induration Neurologic: CN 2-12 grossly intact. Sensation intact, DTR normal. Strength 5/5 in all 4.  Psychiatric: Normal judgment and insight. Alert and oriented x 3. Normal mood.   Labs on Admission: I have personally reviewed following labs and imaging studies  CBC: Recent Labs  Lab 04/10/24 1015  WBC 15.6*  NEUTROABS 12.8*  HGB 12.2  HCT 34.3*  MCV 86.6  PLT 181   Basic Metabolic Panel: Recent Labs  Lab 04/10/24 1015 04/10/24 1121  NA 131*  --   K 2.6*  --   CL 96*  --   CO2 24  --   GLUCOSE 130*  --   BUN 19  --   CREATININE  1.49*  --   CALCIUM  8.3*  --   MG  --  2.1   GFR: Estimated Creatinine Clearance: 29.9 mL/min (A) (by C-G formula based on SCr of 1.49 mg/dL (H)). Liver Function Tests: Recent Labs  Lab 04/10/24 1015  AST 59*  ALT 61*  ALKPHOS 82  BILITOT 1.4*  PROT 7.3  ALBUMIN 3.4*   No results for input(s): "LIPASE", "AMYLASE" in the last 168 hours. No results for input(s): "AMMONIA" in the last 168 hours. Coagulation Profile: Recent Labs  Lab 04/10/24 1121  INR 1.3*   Cardiac Enzymes: No results for input(s): "CKTOTAL", "CKMB", "CKMBINDEX", "TROPONINI" in the last 168 hours. BNP (last 3 results) No results for input(s): "PROBNP" in the last 8760 hours. HbA1C: No results for input(s): "HGBA1C" in the last 72 hours. CBG: No results for input(s): "GLUCAP" in the last 168 hours. Lipid Profile: No results for input(s): "CHOL", "HDL", "LDLCALC", "TRIG", "CHOLHDL", "LDLDIRECT" in the last 72 hours. Thyroid  Function Tests: No results for input(s): "TSH", "T4TOTAL", "FREET4", "T3FREE", "THYROIDAB" in the last 72 hours. Anemia Panel: No results for input(s): "VITAMINB12", "FOLATE", "FERRITIN", "TIBC", "IRON", "RETICCTPCT" in the last 72 hours. Urine analysis:    Component Value Date/Time   COLORURINE AMBER (A) 04/10/2024 1058   APPEARANCEUR CLOUDY (A) 04/10/2024 1058   LABSPEC 1.013 04/10/2024 1058   PHURINE 5.0 04/10/2024 1058   GLUCOSEU NEGATIVE 04/10/2024 1058   HGBUR MODERATE (A) 04/10/2024 1058   BILIRUBINUR NEGATIVE 04/10/2024 1058   BILIRUBINUR neg 08/07/2017 1338   KETONESUR NEGATIVE 04/10/2024 1058   PROTEINUR 100 (A) 04/10/2024 1058   UROBILINOGEN 0.2 08/07/2017 1338   UROBILINOGEN 0.2 11/04/2013 1849   NITRITE NEGATIVE 04/10/2024 1058   LEUKOCYTESUR MODERATE (A) 04/10/2024 1058    Radiological Exams on Admission: CT Head Wo Contrast Result Date: 04/10/2024 CLINICAL DATA:  Meningitis/CNS infection suspected EXAM: CT HEAD WITHOUT CONTRAST TECHNIQUE: Contiguous axial  images were obtained from the base of the skull through the vertex without intravenous contrast. RADIATION DOSE REDUCTION: This  exam was performed according to the departmental dose-optimization program which includes automated exposure control, adjustment of the mA and/or kV according to patient size and/or use of iterative reconstruction technique. COMPARISON:  October 17, 2018, Mar 26, 2015 FINDINGS: Brain: The ventricles appear age appropriate. No mass effect or midline shift. Gray-white differentiation is preserved without focal attenuation abnormality.No evidence of acute territorial infarction, extra-axial fluid collection, hemorrhage, or mass lesion. 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 The basilar cisterns are patent without downward herniation. The cerebellar hemispheres and vermis are well formed without mass lesion or focal attenuation abnormality. Vascular: No hyperdense vessel. Skull: Normal. Negative for fracture or focal lesion. Sinuses/Orbits: The paranasal sinuses and mastoids are clear. The globes appear intact. No retrobulbar hematoma. Other: None. IMPRESSION: No acute intracranial abnormality, specifically, no acute hemorrhage, territorial infarction, or intracranial mass. Electronically Signed   By: Rance Burrows M.D.   On: 04/10/2024 12:47   DG Chest Port 1 View Result Date: 04/10/2024 CLINICAL DATA:  Fever EXAM: PORTABLE CHEST 1 VIEW COMPARISON:  January 26 25 FINDINGS: The heart size and mediastinal contours are within normal limits. Both lungs are clear. The visualized skeletal structures are unremarkable. IMPRESSION: No active disease. Electronically Signed   By: Fredrich Jefferson M.D.   On: 04/10/2024 10:05    EKG: Independently reviewed. ST  Assessment/Plan Principal Problem:   AKI (acute kidney injury) (HCC) Active Problems:   Elevated transaminase level   GLAUCOMA   Hypertension   GERD   Headache(784.0)   Chronic constipation   GAD  (generalized anxiety disorder)   Fever, unspecified   Hypokalemia   Hyponatremia   Hyperbilirubinemia   AKI -  prerenal suspected, hydrating with IV fluid and rechecking in AM a BMP with Mg  Headaches - will give a migraine cocktail and see if this improves, unfortunately patient and family have refused LP, if headache and fever continues will try again for LP  Glaucoma - resume home eye drop medications   Essential hypertension  - soft BPs for now - hydrating - resume home meds if able pending BPs  GAD - resume home meds and follow   Elevated LFTs - suspect could be from a tick borne illness - check acute hepatitis panel - continue IV fluids and recheck in AM with PT/INR - empirically started on oral doxycycline   Hyponatremia - from dehydration  - continue IV fluids and recheck in AM   Hypokalemia - replacing orally and IV and Mg if needed  - recheck in AM   Fever - cause undetermined - suspect tick borne illness - sent labs for lyme disease - empirically treating with doxycycline  - pt/family had refused LP in ED  DVT prophylaxis: heparin  Code Status: full   Family Communication: daughter at bedside   Disposition Plan: TBD   Consults called:   Admission status: OBV  Time spent: 70 mins   Level of care: Telemetry Deeanne Deininger MD [facility] How to contact the [facility] Attending or Consulting provider 7A - 7P or covering provider during after hours 7P -7A, for this patient?  Check the care team in [facility] and look for a) attending/consulting [facility] provider listed and b) the [facility] team listed Log into [email or web address] and use [place]'s universal password to access. If you do not have the password, please contact the hospital operator. Locate the [facility] provider you are looking for under [facility] and page to a number that you can be directly reached. If  you still have difficulty reaching the provider, please page the [facility] (Director on Call) for the  Hospitalists listed on amion for assistance.   If 7PM-7AM, please contact night-coverage [email or web address] Password [email or web address]  04/10/2024, 4:22 PM

## 2024-04-10 NOTE — ED Provider Notes (Signed)
 [place] EMERGENCY DEPARTMENT AT [facility] Provider Note   CSN: [id number] Arrival date & time: 04/10/24  0865     History  Chief Complaint  Patient presents with   Headache    Toni Miller is a 63 y.o. female.  HPI 63 year old female presents with a chief complaint of headache.  She has had an occipital headache waxing and waning for the past 3 days.  When EMS arrived her temperature was 103 and they gave Tylenol .  She has been taking Tylenol  and ibuprofen  intermittently though did not know she had a fever.  No vision changes or focal weakness.  She denies sore throat, cough, abdominal pain, shortness of breath or cellulitis.  She states a tick was removed from her foot about a month ago by her son.  She denies any rash.  She has been noticing a foul odor to her urine with increased urinary frequency, especially at night, for the past 4 or 5 days.  She had some diarrhea that ended yesterday. However, the headache started before the diarrhea.  Home Medications Prior to Admission medications   Medication Sig Start Date End Date Taking? Authorizing Provider  cholecalciferol (VITAMIN D3) 25 MCG (1000 UNIT) tablet Take 1,000 Units by mouth daily.   Yes [provider]  cyclobenzaprine  (FLEXERIL ) 5 MG tablet Take 1 tablet (5 mg total) by mouth at bedtime. 03/26/24  Yes Towanda Fret, MD  cycloSPORINE (RESTASIS) 0.05 % ophthalmic emulsion Place 1 drop into both eyes 2 (two) times daily.   Yes [provider]  esomeprazole  (NEXIUM ) 40 MG capsule TAKE 1 CAPSULE BY MOUTH TWICE  DAILY 03/29/24  Yes Towanda Fret, MD  fluticasone  (FLONASE ) 50 MCG/ACT nasal spray Place 1 spray into both nostrils daily as needed for allergies or rhinitis. 03/15/23  Yes Towanda Fret, MD  gabapentin  (NEURONTIN ) 300 MG capsule Take 1 capsule (300 mg total) by mouth 2 (two) times daily. 03/29/24  Yes Towanda Fret, MD  meloxicam  (MOBIC ) 7.5 MG tablet Take 1 tablet  (7.5 mg total) by mouth daily. 03/26/24  Yes Towanda Fret, MD  montelukast  (SINGULAIR ) 10 MG tablet Take 1 tablet (10 mg total) by mouth daily. 03/29/24  Yes Towanda Fret, MD  Multiple Vitamin (MULTIVITAMIN) capsule Take 1 capsule by mouth daily.   Yes [provider]  PREMARIN  vaginal cream APPLY FINGERTIP AMOUNT VAGINALLY 3 TIMES WEEKLY AS NEEDED 04/05/24  Yes Towanda Fret, MD  rosuvastatin  (CRESTOR ) 10 MG tablet Take 1 tablet (10 mg total) by mouth at bedtime. 11/07/23  Yes Towanda Fret, MD  TRAVATAN  Z 0.004 % SOLN ophthalmic solution Place 1 drop into both eyes at bedtime. 03/29/24  Yes Towanda Fret, MD  triamterene -hydrochlorothiazide (MAXZIDE) 75-50 MG tablet Take 1 tablet by mouth daily. 03/29/24  Yes Towanda Fret, MD  venlafaxine  XR (EFFEXOR -XR) 75 MG 24 hr capsule Take 1 capsule (75 mg total) by mouth daily with breakfast. 03/29/24 07/27/24 Yes Towanda Fret, MD      Allergies    Acyclovir  and related    Review of Systems   Review of Systems  Constitutional:  Positive for fever.  HENT:  Negative for sore throat.   Respiratory:  Negative for cough and shortness of breath.   Cardiovascular:  Negative for chest pain.  Gastrointestinal:  Negative for abdominal pain and vomiting.  Genitourinary:  Positive for frequency. Negative for dysuria.  Musculoskeletal:  Negative for neck pain and neck stiffness.  Neurological:  Positive for headaches. Negative for weakness and numbness.    Physical Exam Updated Vital Signs BP 112/78   Pulse (!) 102   Temp 98.7 F (37.1 C) (Oral)   Resp 15   Ht 5\' 1"  (1.549 m)   Wt 53.1 kg   SpO2 99%   BMI 22.11 kg/m  Physical Exam Vitals and nursing note reviewed.  Constitutional:      Appearance: She is well-developed.  HENT:     Head: Normocephalic and atraumatic.   Eyes:     Extraocular Movements: Extraocular movements intact.     Pupils: Pupils are equal, round, and reactive to light.   Cardiovascular:     Rate and Rhythm: Normal rate and regular rhythm.     Heart sounds: Normal heart sounds.  Pulmonary:     Effort: Pulmonary effort is normal.     Breath sounds: Normal breath sounds.  Abdominal:     Palpations: Abdomen is soft.     Tenderness: There is no abdominal tenderness.  Musculoskeletal:     Cervical back: Normal range of motion. No rigidity.  Skin:    General: Skin is warm and dry.  Neurological:     Mental Status: She is alert and oriented to person, place, and time.     Comments: CN 3-12 grossly intact. 5/5 strength in all 4 extremities. Grossly normal sensation. Normal finger to nose.      ED Results / Procedures / Treatments   Labs (all labs ordered are listed, but only abnormal results are displayed) Labs Reviewed  COMPREHENSIVE METABOLIC PANEL WITH GFR - Abnormal; Notable for the following components:      Result Value   Sodium 131 (*)    Potassium 2.6 (*)    Chloride 96 (*)    Glucose, Bld 130 (*)    Creatinine, Ser 1.49 (*)    Calcium  8.3 (*)    Albumin 3.4 (*)    AST 59 (*)    ALT 61 (*)    Total Bilirubin 1.4 (*)    GFR, Estimated 40 (*)    All other components within normal limits  CBC WITH DIFFERENTIAL/PLATELET - Abnormal; Notable for the following components:   WBC 15.6 (*)    HCT 34.3 (*)    Neutro Abs 12.8 (*)    Monocytes Absolute 1.7 (*)    Abs Immature Granulocytes 0.15 (*)    All other components within normal limits  URINALYSIS, W/ REFLEX TO CULTURE (INFECTION SUSPECTED) - Abnormal; Notable for the following components:   Color, Urine AMBER (*)    APPearance CLOUDY (*)    Hgb urine dipstick MODERATE (*)    Protein, ur 100 (*)    Leukocytes,Ua MODERATE (*)    Bacteria, UA RARE (*)    All other components within normal limits  PROTIME-INR - Abnormal; Notable for the following components:   Prothrombin Time 16.3 (*)    INR 1.3 (*)    All other components within normal limits  RESP PANEL BY RT-PCR (RSV, FLU A&B,  COVID)  [id number]  CULTURE, BLOOD (ROUTINE X 2)  CULTURE, BLOOD (ROUTINE X 2)  URINE CULTURE  LACTIC ACID, PLASMA  MAGNESIUM   LYME DISEASE SEROLOGY W/REFLEX    EKG EKG Interpretation Date/Time:  Wednesday April 10 2024 10:03:53 EDT Ventricular Rate:  111 PR Interval:  120 QRS Duration:  72 QT Interval:  306 QTC Calculation: 416 R Axis:   10  Text Interpretation: Sinus tachycardia Borderline T abnormalities, anterior leads Confirmed  by Jerilynn Montenegro [phone]) on 04/10/2024 10:12:38 AM  Radiology CT Head Wo Contrast Result Date: 04/10/2024 CLINICAL DATA:  Meningitis/CNS infection suspected EXAM: CT HEAD WITHOUT CONTRAST TECHNIQUE: Contiguous axial images were obtained from the base of the skull through the vertex without intravenous contrast. RADIATION DOSE REDUCTION: This exam was performed according to the departmental dose-optimization program which includes automated exposure control, adjustment of the mA and/or kV according to patient size and/or use of iterative reconstruction technique. COMPARISON:  October 17, 2018, Mar 26, 2015 FINDINGS: Brain: The ventricles appear age appropriate. No mass effect or midline shift. Gray-white differentiation is preserved without focal attenuation abnormality.No evidence of acute territorial infarction, extra-axial fluid collection, hemorrhage, or mass lesion. 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 The basilar cisterns are patent without downward herniation. The cerebellar hemispheres and vermis are well formed without mass lesion or focal attenuation abnormality. Vascular: No hyperdense vessel. Skull: Normal. Negative for fracture or focal lesion. Sinuses/Orbits: The paranasal sinuses and mastoids are clear. The globes appear intact. No retrobulbar hematoma. Other: None. IMPRESSION: No acute intracranial abnormality, specifically, no acute hemorrhage, territorial infarction, or intracranial mass. Electronically Signed   By:  Rance Burrows M.D.   On: 04/10/2024 12:47   DG Chest Port 1 View Result Date: 04/10/2024 CLINICAL DATA:  Fever EXAM: PORTABLE CHEST 1 VIEW COMPARISON:  January 26 25 FINDINGS: The heart size and mediastinal contours are within normal limits. Both lungs are clear. The visualized skeletal structures are unremarkable. IMPRESSION: No active disease. Electronically Signed   By: Fredrich Jefferson M.D.   On: 04/10/2024 10:05    Procedures .Critical Care  Performed by: Jerilynn Montenegro, MD Authorized by: Jerilynn Montenegro, MD   Critical care provider statement:    Critical care time (minutes):  30   Critical care time was exclusive of:  Separately billable procedures and treating other patients   Critical care was necessary to treat or prevent imminent or life-threatening deterioration of the following conditions:  Metabolic crisis   Critical care was time spent personally by me on the following activities:  Development of treatment plan with patient or surrogate, discussions with consultants, evaluation of patient's response to treatment, examination of patient, ordering and review of laboratory studies, ordering and review of radiographic studies, ordering and performing treatments and interventions, pulse oximetry, re-evaluation of patient's condition and review of old charts     Medications Ordered in ED Medications  doxycycline  (VIBRA -TABS) tablet 100 mg (100 mg Oral Given 04/10/24 1411)  sodium chloride  0.9 % bolus 1,000 mL (0 mLs Intravenous Stopped 04/10/24 1123)  potassium chloride  SA (KLOR-CON  M) CR tablet 40 mEq (40 mEq Oral Given 04/10/24 1121)  lactated ringers  bolus 1,000 mL (0 mLs Intravenous Stopped 04/10/24 1335)  cefTRIAXone  (ROCEPHIN ) 2 g in sodium chloride  0.9 % 100 mL IVPB (2 g Intravenous New Bag/Given 04/10/24 1340)  potassium chloride  10 mEq in 100 mL IVPB (10 mEq Intravenous New Bag/Given 04/10/24 1338)    ED Course/ Medical Decision Making/ A&P                                 Medical  Decision Making Amount and/or Complexity of Data Reviewed Labs: ordered.    Details: Hypokalemia.  Has a leukocytosis but a normal lactate.  Does have a mild AKI. Radiology: ordered and independent interpretation performed.    Details: No pneumonia or head bleed ECG/medicine tests: ordered and independent  interpretation performed.    Details: Tachycardia  Risk Prescription drug management. Decision regarding hospitalization.   Patient presents with fever and headache.  She is not altered here.  No meningismus.  Has some soft blood pressures with tachycardia, given some fluids.  Workup shows a leukocytosis and moderate leukocytes in the urine but no WBCs and no bacteria.  While she does have some urinary symptoms, this is unclear for UTI.  Given her headache I discussed with her and family over the phone and discussed doing a lumbar puncture.  I discussed the potential risks, benefits, and after discussion they have declined.  Patient is not altered at this time and I think okay to refuse this exam.  Will give Rocephin  for possible UTI.  She will still need admission given her hypokalemia, AKI, and fever.  Discussed with Dr. Lincoln Renshaw for admission.        Final Clinical Impression(s) / ED Diagnoses Final diagnoses:  Acute kidney injury (HCC)  Hypokalemia    Rx / DC Orders ED Discharge Orders     None         Jerilynn Montenegro, MD 04/10/24 1502

## 2024-04-10 NOTE — Hospital Course (Signed)
 63 year old female with history of migraine headaches, GAD, GERD, chronic constipation, glaucoma, postmenopausal hot flashes, hyperlipidemia, chronic low back pain with sciatica, mild intermittent asthma presented to the ED by EMS complaining of an occipital headache that has been present for the last 3 days and associated with fever up to 103 degrees.  She reports that EMS found her to be febrile with temp of 103 and she was mildly hypotensive and tachycardic.  She reported that son removed a tick from her left foot about 1 month ago and the tick had been present for about 3 days.  She also reports foul smell coming from urine and she had had some increased urinary frequency and nocturia for past 4-5 days.  The ED provider had recommended an LP to rule out meningitis however patient and family refused.  She is being admitted for further observation and management.

## 2024-04-11 ENCOUNTER — Ambulatory Visit: Admitting: Orthopaedic Surgery

## 2024-04-11 DIAGNOSIS — F32A Depression, unspecified: Secondary | ICD-10-CM | POA: Diagnosis present

## 2024-04-11 DIAGNOSIS — A4151 Sepsis due to Escherichia coli [E. coli]: Secondary | ICD-10-CM | POA: Diagnosis present

## 2024-04-11 DIAGNOSIS — Z801 Family history of malignant neoplasm of trachea, bronchus and lung: Secondary | ICD-10-CM | POA: Diagnosis not present

## 2024-04-11 DIAGNOSIS — R509 Fever, unspecified: Secondary | ICD-10-CM | POA: Diagnosis not present

## 2024-04-11 DIAGNOSIS — R7881 Bacteremia: Secondary | ICD-10-CM | POA: Diagnosis present

## 2024-04-11 DIAGNOSIS — W19XXXA Unspecified fall, initial encounter: Secondary | ICD-10-CM | POA: Diagnosis present

## 2024-04-11 DIAGNOSIS — R7401 Elevation of levels of liver transaminase levels: Secondary | ICD-10-CM | POA: Diagnosis not present

## 2024-04-11 DIAGNOSIS — B9620 Unspecified Escherichia coli [E. coli] as the cause of diseases classified elsewhere: Secondary | ICD-10-CM | POA: Diagnosis present

## 2024-04-11 DIAGNOSIS — E860 Dehydration: Secondary | ICD-10-CM | POA: Diagnosis present

## 2024-04-11 DIAGNOSIS — M25532 Pain in left wrist: Secondary | ICD-10-CM | POA: Diagnosis not present

## 2024-04-11 DIAGNOSIS — Z806 Family history of leukemia: Secondary | ICD-10-CM | POA: Diagnosis not present

## 2024-04-11 DIAGNOSIS — E871 Hypo-osmolality and hyponatremia: Secondary | ICD-10-CM | POA: Diagnosis present

## 2024-04-11 DIAGNOSIS — Z87891 Personal history of nicotine dependence: Secondary | ICD-10-CM | POA: Diagnosis not present

## 2024-04-11 DIAGNOSIS — I10 Essential (primary) hypertension: Secondary | ICD-10-CM | POA: Diagnosis present

## 2024-04-11 DIAGNOSIS — Z1152 Encounter for screening for COVID-19: Secondary | ICD-10-CM | POA: Diagnosis not present

## 2024-04-11 DIAGNOSIS — Y92009 Unspecified place in unspecified non-institutional (private) residence as the place of occurrence of the external cause: Secondary | ICD-10-CM | POA: Diagnosis not present

## 2024-04-11 DIAGNOSIS — R519 Headache, unspecified: Secondary | ICD-10-CM | POA: Diagnosis not present

## 2024-04-11 DIAGNOSIS — N390 Urinary tract infection, site not specified: Secondary | ICD-10-CM | POA: Diagnosis present

## 2024-04-11 DIAGNOSIS — J4520 Mild intermittent asthma, uncomplicated: Secondary | ICD-10-CM | POA: Diagnosis present

## 2024-04-11 DIAGNOSIS — Z800 Family history of malignant neoplasm of digestive organs: Secondary | ICD-10-CM | POA: Diagnosis not present

## 2024-04-11 DIAGNOSIS — E876 Hypokalemia: Secondary | ICD-10-CM | POA: Diagnosis present

## 2024-04-11 DIAGNOSIS — Z8249 Family history of ischemic heart disease and other diseases of the circulatory system: Secondary | ICD-10-CM | POA: Diagnosis not present

## 2024-04-11 DIAGNOSIS — F411 Generalized anxiety disorder: Secondary | ICD-10-CM | POA: Diagnosis present

## 2024-04-11 DIAGNOSIS — K5909 Other constipation: Secondary | ICD-10-CM | POA: Diagnosis present

## 2024-04-11 DIAGNOSIS — Z811 Family history of alcohol abuse and dependence: Secondary | ICD-10-CM | POA: Diagnosis not present

## 2024-04-11 DIAGNOSIS — N179 Acute kidney failure, unspecified: Secondary | ICD-10-CM | POA: Diagnosis present

## 2024-04-11 DIAGNOSIS — Z803 Family history of malignant neoplasm of breast: Secondary | ICD-10-CM | POA: Diagnosis not present

## 2024-04-11 DIAGNOSIS — K219 Gastro-esophageal reflux disease without esophagitis: Secondary | ICD-10-CM | POA: Diagnosis present

## 2024-04-11 DIAGNOSIS — E785 Hyperlipidemia, unspecified: Secondary | ICD-10-CM | POA: Diagnosis present

## 2024-04-11 DIAGNOSIS — H409 Unspecified glaucoma: Secondary | ICD-10-CM | POA: Diagnosis present

## 2024-04-11 LAB — COMPREHENSIVE METABOLIC PANEL WITH GFR
ALT: 45 U/L — ABNORMAL HIGH (ref 0–44)
AST: 36 U/L (ref 15–41)
Albumin: 2.7 g/dL — ABNORMAL LOW (ref 3.5–5.0)
Alkaline Phosphatase: 83 U/L (ref 38–126)
Anion gap: 9 (ref 5–15)
BUN: 12 mg/dL (ref 8–23)
CO2: 24 mmol/L (ref 22–32)
Calcium: 7.8 mg/dL — ABNORMAL LOW (ref 8.9–10.3)
Chloride: 105 mmol/L (ref 98–111)
Creatinine, Ser: 1.05 mg/dL — ABNORMAL HIGH (ref 0.44–1.00)
GFR, Estimated: >60 mL/min (ref >60)
Glucose, Bld: 104 mg/dL — ABNORMAL HIGH (ref 70–99)
Potassium: 3.3 mmol/L — ABNORMAL LOW (ref 3.5–5.1)
Sodium: 138 mmol/L (ref 135–145)
Total Bilirubin: 0.7 mg/dL (ref 0.0–1.2)
Total Protein: 6.1 g/dL — ABNORMAL LOW (ref 6.5–8.1)

## 2024-04-11 LAB — BLOOD CULTURE ID PANEL (REFLEXED) - BCID2

## 2024-04-11 LAB — CBC WITH DIFFERENTIAL/PLATELET
Abs Immature Granulocytes: 0.1 10*3/uL — ABNORMAL HIGH (ref 0.00–0.07)
Basophils Absolute: 0 10*3/uL (ref 0.0–0.1)
Basophils Relative: 0 %
Eosinophils Absolute: 0 10*3/uL (ref 0.0–0.5)
Eosinophils Relative: 0 %
HCT: 30.5 % — ABNORMAL LOW (ref 36.0–46.0)
Hemoglobin: 10.7 g/dL — ABNORMAL LOW (ref 12.0–15.0)
Lymphocytes Relative: 15 %
Lymphs Abs: 2 10*3/uL (ref 0.7–4.0)
MCH: 31.5 pg (ref 26.0–34.0)
MCHC: 35.1 g/dL (ref 30.0–36.0)
MCV: 89.7 fL (ref 80.0–100.0)
Metamyelocytes Relative: 1 %
Monocytes Absolute: 1.1 10*3/uL — ABNORMAL HIGH (ref 0.1–1.0)
Monocytes Relative: 8 %
Neutro Abs: 10.3 10*3/uL — ABNORMAL HIGH (ref 1.7–7.7)
Neutrophils Relative %: 76 %
Platelets: 163 10*3/uL (ref 150–400)
RBC: 3.4 MIL/uL — ABNORMAL LOW (ref 3.87–5.11)
RDW: 13.2 % (ref 11.5–15.5)
Smear Review: NORMAL
WBC: 13.5 10*3/uL — ABNORMAL HIGH (ref 4.0–10.5)
nRBC: 0 % (ref 0.0–0.2)

## 2024-04-11 LAB — HIV ANTIBODY (ROUTINE TESTING W REFLEX): HIV Screen 4th Generation wRfx: NONREACTIVE

## 2024-04-11 LAB — LYME DISEASE SEROLOGY W/REFLEX: Lyme Total Antibody EIA: NEGATIVE

## 2024-04-11 LAB — MAGNESIUM: Magnesium: 2.1 mg/dL (ref 1.7–2.4)

## 2024-04-11 LAB — PROTIME-INR
INR: 1.3 — ABNORMAL HIGH (ref 0.8–1.2)
Prothrombin Time: 16.1 s — ABNORMAL HIGH (ref 11.4–15.2)

## 2024-04-11 MED ORDER — SODIUM CHLORIDE 0.9 % IV SOLN
2.0000 g | INTRAVENOUS | Status: AC
  Administered 2024-04-11: 2 g via INTRAVENOUS
  Filled 2024-04-11: qty 12.5

## 2024-04-11 MED ORDER — SODIUM CHLORIDE 0.9 % IV SOLN: 2.0000 g | Freq: Two times a day (BID) | INTRAVENOUS | Status: DC

## 2024-04-11 MED ORDER — IBUPROFEN 600 MG PO TABS
600.0000 mg | ORAL_TABLET | Freq: Once | ORAL | Status: AC
  Administered 2024-04-11: 600 mg via ORAL
  Filled 2024-04-11: qty 1

## 2024-04-11 MED ORDER — LACTATED RINGERS IV SOLN: INTRAVENOUS | Status: DC

## 2024-04-11 MED ORDER — SODIUM CHLORIDE 0.9 % IV SOLN
2.0000 g | INTRAVENOUS | Status: DC
  Administered 2024-04-11 – 2024-04-12 (×2): 2 g via INTRAVENOUS
  Filled 2024-04-11 (×2): qty 20

## 2024-04-11 MED ORDER — POTASSIUM CHLORIDE CRYS ER 20 MEQ PO TBCR
40.0000 meq | EXTENDED_RELEASE_TABLET | Freq: Once | ORAL | Status: AC
  Administered 2024-04-11: 40 meq via ORAL
  Filled 2024-04-11: qty 2

## 2024-04-11 MED ORDER — ENOXAPARIN SODIUM 40 MG/0.4ML IJ SOSY
40.0000 mg | PREFILLED_SYRINGE | INTRAMUSCULAR | Status: DC
  Administered 2024-04-12 – 2024-04-13 (×2): 40 mg via SUBCUTANEOUS
  Filled 2024-04-11 (×2): qty 0.4

## 2024-04-11 MED ORDER — ENSURE PLUS HIGH PROTEIN PO LIQD
237.0000 mL | Freq: Two times a day (BID) | ORAL | Status: DC
  Administered 2024-04-11 – 2024-04-13 (×3): 237 mL via ORAL

## 2024-04-11 NOTE — Plan of Care (Signed)
  Problem: Education: Goal: Knowledge of General Education information will improve Description: Including pain rating scale, medication(s)/side effects and non-pharmacologic comfort measures 04/11/2024 0518 by Sherrye Dominion, RN Outcome: Progressing 04/11/2024 0518 by Sherrye Dominion, RN Outcome: Progressing   Problem: Health Behavior/Discharge Planning: Goal: Ability to manage health-related needs will improve 04/11/2024 0518 by Sherrye Dominion, RN Outcome: Progressing 04/11/2024 0518 by Sherrye Dominion, RN Outcome: Progressing   Problem: Clinical Measurements: Goal: Ability to maintain clinical measurements within normal limits will improve 04/11/2024 0518 by Sherrye Dominion, RN Outcome: Progressing 04/11/2024 0518 by Sarahgrace Broman, RN Outcome: Progressing Goal: Will remain free from infection 04/11/2024 0518 by Sherrye Dominion, RN Outcome: Progressing 04/11/2024 0518 by Sherrye Dominion, RN Outcome: Progressing Goal: Diagnostic test results will improve 04/11/2024 0518 by Sherrye Dominion, RN Outcome: Progressing 04/11/2024 0518 by Sherrye Dominion, RN Outcome: Progressing Goal: Respiratory complications will improve 04/11/2024 0518 by Sherrye Dominion, RN Outcome: Progressing 04/11/2024 0518 by Adelae Yodice, RN Outcome: Progressing Goal: Cardiovascular complication will be avoided 04/11/2024 0518 by Sherrye Dominion, RN Outcome: Progressing 04/11/2024 0518 by Sherrye Dominion, RN Outcome: Progressing   Problem: Activity: Goal: Risk for activity intolerance will decrease 04/11/2024 0518 by Sherrye Dominion, RN Outcome: Progressing 04/11/2024 0518 by Sherrye Dominion, RN Outcome: Progressing   Problem: Nutrition: Goal: Adequate nutrition will be maintained 04/11/2024 0518 by Sherrye Dominion, RN Outcome: Progressing 04/11/2024 0518 by Sherrye Dominion, RN Outcome: Progressing   Problem: Coping: Goal: Level of anxiety will decrease 04/11/2024 0518 by Sherrye Dominion, RN Outcome: Progressing 04/11/2024 0518 by Sherrye Dominion, RN Outcome: Progressing   Problem: Elimination: Goal: Will not  experience complications related to bowel motility 04/11/2024 0518 by Sherrye Dominion, RN Outcome: Progressing 04/11/2024 0518 by Lennis Korb, RN Outcome: Progressing Goal: Will not experience complications related to urinary retention 04/11/2024 0518 by Sherrye Dominion, RN Outcome: Progressing 04/11/2024 0518 by Sherrye Dominion, RN Outcome: Progressing   Problem: Pain Managment: Goal: General experience of comfort will improve and/or be controlled 04/11/2024 0518 by Sherrye Dominion, RN Outcome: Progressing 04/11/2024 0518 by Sherrye Dominion, RN Outcome: Progressing   Problem: Safety: Goal: Ability to remain free from injury will improve 04/11/2024 0518 by Sherrye Dominion, RN Outcome: Progressing 04/11/2024 0518 by Sherrye Dominion, RN Outcome: Progressing   Problem: Skin Integrity: Goal: Risk for impaired skin integrity will decrease 04/11/2024 0518 by Sherrye Dominion, RN Outcome: Progressing 04/11/2024 0518 by Sherrye Dominion, RN Outcome: Progressing

## 2024-04-11 NOTE — Progress Notes (Signed)
 PHARMACY - PHYSICIAN COMMUNICATION CRITICAL VALUE ALERT - BLOOD CULTURE IDENTIFICATION (BCID)  Toni Miller is an 63 y.o. female who presented to [facility] on 04/10/2024 with a chief complaint of headache.  Assessment:  Patient now with ecoli bacteremia in 1/4 bottles. No known source of infection with history of tick bite a month ago and possible UTI earlier in the week.   Name of physician (or Provider) Contacted: Lincoln Renshaw  Current antibiotics: cefepime  Changes to prescribed antibiotics recommended:  Recommendations accepted by provider, will narrow to ceftriaxone   Results for orders placed or performed during the hospital encounter of 04/10/24  Blood Culture ID Panel (Reflexed) (Collected: 04/10/2024 10:20 AM)  Result Value Ref Range   Enterococcus faecalis NOT DETECTED NOT DETECTED   Enterococcus Faecium NOT DETECTED NOT DETECTED   Listeria monocytogenes NOT DETECTED NOT DETECTED   Staphylococcus species NOT DETECTED NOT DETECTED   Staphylococcus aureus (BCID) NOT DETECTED NOT DETECTED   Staphylococcus epidermidis NOT DETECTED NOT DETECTED   Staphylococcus lugdunensis NOT DETECTED NOT DETECTED   Streptococcus species NOT DETECTED NOT DETECTED   Streptococcus agalactiae NOT DETECTED NOT DETECTED   Streptococcus pneumoniae NOT DETECTED NOT DETECTED   Streptococcus pyogenes NOT DETECTED NOT DETECTED   A.calcoaceticus-baumannii NOT DETECTED NOT DETECTED   Bacteroides fragilis NOT DETECTED NOT DETECTED   Enterobacterales DETECTED (A) NOT DETECTED   Enterobacter cloacae complex NOT DETECTED NOT DETECTED   Escherichia coli DETECTED (A) NOT DETECTED   Klebsiella aerogenes NOT DETECTED NOT DETECTED   Klebsiella oxytoca NOT DETECTED NOT DETECTED   Klebsiella pneumoniae NOT DETECTED NOT DETECTED   Proteus species NOT DETECTED NOT DETECTED   Salmonella species NOT DETECTED NOT DETECTED   Serratia marcescens NOT DETECTED NOT DETECTED   Haemophilus influenzae NOT DETECTED NOT DETECTED    Neisseria meningitidis NOT DETECTED NOT DETECTED   Pseudomonas aeruginosa NOT DETECTED NOT DETECTED   Stenotrophomonas maltophilia NOT DETECTED NOT DETECTED   Candida albicans NOT DETECTED NOT DETECTED   Candida auris NOT DETECTED NOT DETECTED   Candida glabrata NOT DETECTED NOT DETECTED   Candida krusei NOT DETECTED NOT DETECTED   Candida parapsilosis NOT DETECTED NOT DETECTED   Candida tropicalis NOT DETECTED NOT DETECTED   Cryptococcus neoformans/gattii NOT DETECTED NOT DETECTED   CTX-M ESBL NOT DETECTED NOT DETECTED   Carbapenem resistance IMP NOT DETECTED NOT DETECTED   Carbapenem resistance KPC NOT DETECTED NOT DETECTED   Carbapenem resistance NDM NOT DETECTED NOT DETECTED   Carbapenem resist OXA 48 LIKE NOT DETECTED NOT DETECTED   Carbapenem resistance VIM NOT DETECTED NOT DETECTED    Audra Blend PharmD., BCPS Clinical Pharmacist 04/11/2024 11:24 AM

## 2024-04-11 NOTE — Plan of Care (Signed)
   Problem: Education: Goal: Knowledge of General Education information will improve Description: Including pain rating scale, medication(s)/side effects and non-pharmacologic comfort measures Outcome: Progressing   Problem: Pain Managment: Goal: General experience of comfort will improve and/or be controlled Outcome: Progressing   Problem: Safety: Goal: Ability to remain free from injury will improve Outcome: Progressing

## 2024-04-11 NOTE — Progress Notes (Signed)
 PROGRESS NOTE   Toni Miller  WUJ:[id number] DOB: Feb 07, 1961 DOA: 04/10/2024 PCP: Towanda Fret, MD   Chief Complaint  Patient presents with   Headache   Level of care: Telemetry  Brief Admission History:  63 year old female with history of migraine headaches, GAD, GERD, chronic constipation, glaucoma, postmenopausal hot flashes, hyperlipidemia, chronic low back pain with sciatica, mild intermittent asthma presented to the ED by EMS complaining of an occipital headache that has been present for the last 3 days and associated with fever up to 103 degrees.  She reports that EMS found her to be febrile with temp of 103 and she was mildly hypotensive and tachycardic.  She reported that son removed a tick from her left foot about 1 month ago and the tick had been present for about 3 days.  She also reports foul smell coming from urine and she had had some increased urinary frequency and nocturia for past 4-5 days.  The ED provider had recommended an LP to rule out meningitis however patient and family refused.  She is being admitted for further observation and management.     Assessment and Plan:  AKI -  prerenal suspected, hydrating with IV fluid and rechecking in AM a BMP with Mg, improving with IV fluid hydration   E coli Bacteremia - source not clear, reported tick bite earlier in the month and UTI symptoms - follow up urine culture - follow up ID&sensitivities - continue ceftriaxone  IV every 24 hours pending C&S findings   Headaches - improving; pt had declined LP on admission   Glaucoma - resumed home eye drop medications    Essential hypertension  - soft BPs for now - hydrating - resume home meds if able pending BPs   GAD - resume home meds and follow    Elevated LFTs - suspect could be from a tick borne illness - check acute hepatitis panel - continue IV fluids and recheck in AM with PT/INR - empirically started on oral doxycycline  - LFTs trending down with  hydration - possibly from E coli bacteremia   Hyponatremia - resolved  - from dehydration  - continue IV fluids and recheck in AM    Hypokalemia - replacing orally and IV and Mg if needed  - recheck in AM    Fever - secondary to bacteremia  - suspect tick borne illness - sent labs for lyme disease - empirically treating with doxycycline  - pt/family had refused LP in ED - continue IV antibiotics as ordered   DVT prophylaxis: sq heparin -- enoxaparin starting 6/6 Code Status: Full  Family Communication:  Disposition: anticipating home in next 1-2 days   Consultants:   Procedures:   Antimicrobials:  Ceftriaxone  6/4>>> Cefepime 6/5-6/5   Subjective: Pt reports that her headache is starting to get much better   Objective: Vitals:   04/11/24 0443 04/11/24 0647 04/11/24 0846 04/11/24 1207  BP: 103/71  96/68 107/69  Pulse: (!) 115  88 93  Resp: 20  19 17   Temp: (!) 100.7 F (38.2 C) (!) 100.5 F (38.1 C) 98.1 F (36.7 C) 99.1 F (37.3 C)  TempSrc: Oral Oral Oral Oral  SpO2: 95%   98%  Weight:      Height:        Intake/Output Summary (Last 24 hours) at 04/11/2024 1616 Last data filed at 04/11/2024 0923 Gross per 24 hour  Intake 1061.18 ml  Output --  Net 1061.18 ml   Filed Weights   04/10/24 0908  Weight: 53.1 kg   Examination:  General exam: Appears calm and comfortable  Respiratory system: Clear to auscultation. Respiratory effort normal. Cardiovascular system: normal S1 & S2 heard. No JVD, murmurs, rubs, gallops or clicks. No pedal edema. Gastrointestinal system: Abdomen is nondistended, soft and nontender. No organomegaly or masses felt. Normal bowel sounds heard. Central nervous system: Alert and oriented. No focal neurological deficits. Extremities: Symmetric 5 x 5 power. Skin: No rashes, lesions or ulcers. Psychiatry: Judgement and insight appear normal. Mood & affect appropriate.   Data Reviewed: I have personally reviewed following labs and  imaging studies  CBC: Recent Labs  Lab 04/10/24 1015 04/11/24 0501  WBC 15.6* 13.5*  NEUTROABS 12.8* 10.3*  HGB 12.2 10.7*  HCT 34.3* 30.5*  MCV 86.6 89.7  PLT 181 163    Basic Metabolic Panel: Recent Labs  Lab 04/10/24 1015 04/10/24 1121 04/11/24 0501  NA 131*  --  138  K 2.6*  --  3.3*  CL 96*  --  105  CO2 24  --  24  GLUCOSE 130*  --  104*  BUN 19  --  12  CREATININE 1.49*  --  1.05*  CALCIUM  8.3*  --  7.8*  MG  --  2.1 2.1    CBG: No results for input(s): "GLUCAP" in the last 168 hours.  Recent Results (from the past 240 hours)  Blood Culture (routine x 2)     Status: None (Preliminary result)   Collection Time: 04/10/24 10:15 AM   Specimen: BLOOD  Result Value Ref Range Status   Specimen Description BLOOD BLOOD LEFT ARM  Final   Special Requests   Final    Blood Culture adequate volume BOTTLES DRAWN AEROBIC AND ANAEROBIC   Culture   Final    NO GROWTH < 24 HOURS Performed at [facility], [address], [place], [place] [address]    Report Status PENDING  Incomplete  Blood Culture (routine x 2)     Status: None (Preliminary result)   Collection Time: 04/10/24 10:20 AM   Specimen: BLOOD  Result Value Ref Range Status   Specimen Description   Final    BLOOD RIGHT ANTECUBITAL Performed at [facility], [address], [place], [place] [address]    Special Requests   Final    BOTTLES DRAWN AEROBIC AND ANAEROBIC Blood Culture results may not be optimal due to an inadequate volume of blood received in culture bottles Performed at [facility], [address], [place], [place] [address]    Culture  Setup Time   Final    GRAM NEGATIVE RODS ANAEROBIC BOTTLE ONLY Gram Stain Report Called to,Read Back By and Verified With: JADA MUSE,RN AT 0232 04/11/24 BY A. SNYDER CRITICAL RESULT CALLED TO, READ BACK BY AND VERIFIED WITH: Valdene Garret on [phone] @1034  by SM Performed at [facility], [address], [place], [place] [address]    Culture GRAM  NEGATIVE RODS  Final   Report Status PENDING  Incomplete  Blood Culture ID Panel (Reflexed)     Status: Abnormal   Collection Time: 04/10/24 10:20 AM  Result Value Ref Range Status   Enterococcus faecalis NOT DETECTED NOT DETECTED Final   Enterococcus Faecium NOT DETECTED NOT DETECTED Final   Listeria monocytogenes NOT DETECTED NOT DETECTED Final   Staphylococcus species NOT DETECTED NOT DETECTED Final   Staphylococcus aureus (BCID) NOT DETECTED NOT DETECTED Final   Staphylococcus epidermidis NOT DETECTED NOT DETECTED Final   Staphylococcus  lugdunensis NOT DETECTED NOT DETECTED Final   Streptococcus species NOT DETECTED NOT DETECTED Final   Streptococcus agalactiae NOT DETECTED NOT DETECTED Final   Streptococcus pneumoniae NOT DETECTED NOT DETECTED Final   Streptococcus pyogenes NOT DETECTED NOT DETECTED Final   A.calcoaceticus-baumannii NOT DETECTED NOT DETECTED Final   Bacteroides fragilis NOT DETECTED NOT DETECTED Final   Enterobacterales DETECTED (A) NOT DETECTED Final    Comment: Enterobacterales represent a large order of gram negative bacteria, not a single organism. CRITICAL RESULT CALLED TO, READ BACK BY AND VERIFIED WITH: PHARMD Joey Muster on [phone] @1034  by SM    Enterobacter cloacae complex NOT DETECTED NOT DETECTED Final   Escherichia coli DETECTED (A) NOT DETECTED Final    Comment: CRITICAL RESULT CALLED TO, READ BACK BY AND VERIFIED WITH: Valdene Garret on [phone] @1034  by SM    Klebsiella aerogenes NOT DETECTED NOT DETECTED Final   Klebsiella oxytoca NOT DETECTED NOT DETECTED Final   Klebsiella pneumoniae NOT DETECTED NOT DETECTED Final   Proteus species NOT DETECTED NOT DETECTED Final   Salmonella species NOT DETECTED NOT DETECTED Final   Serratia marcescens NOT DETECTED NOT DETECTED Final   Haemophilus influenzae NOT DETECTED NOT DETECTED Final   Neisseria meningitidis NOT DETECTED NOT DETECTED Final   Pseudomonas aeruginosa NOT DETECTED NOT DETECTED Final    Stenotrophomonas maltophilia NOT DETECTED NOT DETECTED Final   Candida albicans NOT DETECTED NOT DETECTED Final   Candida auris NOT DETECTED NOT DETECTED Final   Candida glabrata NOT DETECTED NOT DETECTED Final   Candida krusei NOT DETECTED NOT DETECTED Final   Candida parapsilosis NOT DETECTED NOT DETECTED Final   Candida tropicalis NOT DETECTED NOT DETECTED Final   Cryptococcus neoformans/gattii NOT DETECTED NOT DETECTED Final   CTX-M ESBL NOT DETECTED NOT DETECTED Final   Carbapenem resistance IMP NOT DETECTED NOT DETECTED Final   Carbapenem resistance KPC NOT DETECTED NOT DETECTED Final   Carbapenem resistance NDM NOT DETECTED NOT DETECTED Final   Carbapenem resist OXA 48 LIKE NOT DETECTED NOT DETECTED Final   Carbapenem resistance VIM NOT DETECTED NOT DETECTED Final    Comment: Performed at [facility], [address] [place], [place] [address]  Urine Culture     Status: Abnormal (Preliminary result)   Collection Time: 04/10/24 12:09 PM   Specimen: Urine, Clean Catch  Result Value Ref Range Status   Specimen Description   Final    URINE, CLEAN CATCH Performed at [facility], [address], [place], [place] [address]    Special Requests   Final    NONE Performed at [facility], [address], [place], [place] [address]    Culture >=100,000 COLONIES/mL ESCHERICHIA COLI (A)  Final   Report Status PENDING  Incomplete  Resp panel by RT-PCR (RSV, Flu A&B, Covid) Anterior Nasal Swab     Status: None   Collection Time: 04/10/24  1:16 PM   Specimen: Anterior Nasal Swab  Result Value Ref Range Status   SARS Coronavirus 2 by RT PCR NEGATIVE NEGATIVE Final    Comment: (NOTE) SARS-CoV-2 target nucleic acids are NOT DETECTED.  The SARS-CoV-2 RNA is generally detectable in upper respiratory specimens during the acute phase of infection. The lowest concentration of SARS-CoV-2 viral copies this assay can detect is 138 copies/mL. A negative result does not preclude  SARS-Cov-2 infection and should not be used as the sole basis for treatment or other patient management decisions. A negative result may occur with  improper specimen collection/handling, submission  of specimen other than nasopharyngeal swab, presence of viral mutation(s) within the areas targeted by this assay, and inadequate number of viral copies(<138 copies/mL). A negative result must be combined with clinical observations, patient history, and epidemiological information. The expected result is Negative.  Fact Sheet for Patients:  BloggerCourse.com  Fact Sheet for Healthcare Providers:  SeriousBroker.it  This test is no t yet approved or cleared by the [place]  FDA and  has been authorized for detection and/or diagnosis of SARS-CoV-2 by FDA under an Emergency Use Authorization (EUA). This EUA will remain  in effect (meaning this test can be used) for the duration of the COVID-19 declaration under Section 564(b)(1) of the Act, 21 U.S.C.section 360bbb-3(b)(1), unless the authorization is terminated  or revoked sooner.       Influenza A by PCR NEGATIVE NEGATIVE Final   Influenza B by PCR NEGATIVE NEGATIVE Final    Comment: (NOTE) The Xpert Xpress SARS-CoV-2/FLU/RSV plus assay is intended as an aid in the diagnosis of influenza from Nasopharyngeal swab specimens and should not be used as a sole basis for treatment. Nasal washings and aspirates are unacceptable for Xpert Xpress SARS-CoV-2/FLU/RSV testing.  Fact Sheet for Patients: BloggerCourse.com  Fact Sheet for Healthcare Providers: SeriousBroker.it  This test is not yet approved or cleared by the [place]  FDA and has been authorized for detection and/or diagnosis of SARS-CoV-2 by FDA under an Emergency Use Authorization (EUA). This EUA will remain in effect (meaning this test can be used) for the duration of  the COVID-19 declaration under Section 564(b)(1) of the Act, 21 U.S.C. section 360bbb-3(b)(1), unless the authorization is terminated or revoked.     Resp Syncytial Virus by PCR NEGATIVE NEGATIVE Final    Comment: (NOTE) Fact Sheet for Patients: BloggerCourse.com  Fact Sheet for Healthcare Providers: SeriousBroker.it  This test is not yet approved or cleared by the [place]  FDA and has been authorized for detection and/or diagnosis of SARS-CoV-2 by FDA under an Emergency Use Authorization (EUA). This EUA will remain in effect (meaning this test can be used) for the duration of the COVID-19 declaration under Section 564(b)(1) of the Act, 21 U.S.C. section 360bbb-3(b)(1), unless the authorization is terminated or revoked.  Performed at [facility], [address] [place], [place] [address]      Radiology Studies: CT Head Wo Contrast Result Date: 04/10/2024 CLINICAL DATA:  Meningitis/CNS infection suspected EXAM: CT HEAD WITHOUT CONTRAST TECHNIQUE: Contiguous axial images were obtained from the base of the skull through the vertex without intravenous contrast. RADIATION DOSE REDUCTION: This exam was performed according to the departmental dose-optimization program which includes automated exposure control, adjustment of the mA and/or kV according to patient size and/or use of iterative reconstruction technique. COMPARISON:  October 17, 2018, Mar 26, 2015 FINDINGS: Brain: The ventricles appear age appropriate. No mass effect or midline shift. Gray-white differentiation is preserved without focal attenuation abnormality.No evidence of acute territorial infarction, extra-axial fluid collection, hemorrhage, or mass lesion. 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 The basilar cisterns are patent without downward herniation. The cerebellar hemispheres and vermis are well formed without mass lesion or focal  attenuation abnormality. Vascular: No hyperdense vessel. Skull: Normal. Negative for fracture or focal lesion. Sinuses/Orbits: The paranasal sinuses and mastoids are clear. The globes appear intact. No retrobulbar hematoma. Other: None. IMPRESSION: No acute intracranial abnormality, specifically, no acute hemorrhage, territorial infarction, or intracranial mass. Electronically Signed   By: Rance Burrows M.D.   On: 04/10/2024 12:47  DG Chest Port 1 View Result Date: 04/10/2024 CLINICAL DATA:  Fever EXAM: PORTABLE CHEST 1 VIEW COMPARISON:  January 26 25 FINDINGS: The heart size and mediastinal contours are within normal limits. Both lungs are clear. The visualized skeletal structures are unremarkable. IMPRESSION: No active disease. Electronically Signed   By: Fredrich Jefferson M.D.   On: 04/10/2024 10:05    Scheduled Meds:  acetaminophen   650 mg Oral Q6H   Or   acetaminophen   650 mg Rectal Q6H   acetaminophen -caffeine   2 tablet Oral Once   cycloSPORINE  1 drop Both Eyes BID   doxycycline   100 mg Oral Q12H   feeding supplement  237 mL Oral BID BM   gabapentin   200 mg Oral QHS   heparin  5,000 Units Subcutaneous Q8H   latanoprost  1 drop Both Eyes QHS   montelukast   10 mg Oral QHS   pantoprazole  40 mg Oral BID   potassium chloride   40 mEq Oral Once   rosuvastatin   10 mg Oral QHS   venlafaxine  XR  75 mg Oral Q breakfast   Continuous Infusions:  cefTRIAXone  (ROCEPHIN )  IV     lactated ringers        LOS: 0 days   Time spent: 57 mins  Idrees Quam Lincoln Renshaw, MD How to contact the [facility] Attending or Consulting provider 7A - 7P or covering provider during after hours 7P -7A, for this patient?  Check the care team in [facility] and look for a) attending/consulting [facility] provider listed and b) the [facility] team listed Log into [email or web address] to find provider on call.  Locate the [facility] provider you are looking for under [facility] and page to a number that you can be directly reached. If you still have  difficulty reaching the provider, please page the [facility] (Director on Call) for the Hospitalists listed on amion for assistance.  04/11/2024, 4:16 PM

## 2024-04-11 NOTE — Significant Event (Signed)
 Notified of Bcx + GNR in anerobic bottle. Additional set pending. BCID will need to be sent out. Currently on Ceftriaxone  2gIV. Will broaden to Cefepime 2g IV q 12 hr while awaiting additional speciation / sensitivities.  Arnulfo Larch, MD  [facility]

## 2024-04-12 ENCOUNTER — Inpatient Hospital Stay (HOSPITAL_COMMUNITY)

## 2024-04-12 DIAGNOSIS — H409 Unspecified glaucoma: Secondary | ICD-10-CM | POA: Diagnosis not present

## 2024-04-12 DIAGNOSIS — R519 Headache, unspecified: Secondary | ICD-10-CM | POA: Diagnosis not present

## 2024-04-12 DIAGNOSIS — N179 Acute kidney failure, unspecified: Secondary | ICD-10-CM | POA: Diagnosis not present

## 2024-04-12 DIAGNOSIS — E871 Hypo-osmolality and hyponatremia: Secondary | ICD-10-CM | POA: Diagnosis not present

## 2024-04-12 LAB — CBC WITH DIFFERENTIAL/PLATELET
Abs Immature Granulocytes: 0.07 10*3/uL (ref 0.00–0.07)
Basophils Absolute: 0 10*3/uL (ref 0.0–0.1)
Basophils Relative: 0 %
Eosinophils Absolute: 0.3 10*3/uL (ref 0.0–0.5)
Eosinophils Relative: 3 %
HCT: 27.6 % — ABNORMAL LOW (ref 36.0–46.0)
Hemoglobin: 9.4 g/dL — ABNORMAL LOW (ref 12.0–15.0)
Immature Granulocytes: 1 %
Lymphocytes Relative: 11 %
Lymphs Abs: 1.3 10*3/uL (ref 0.7–4.0)
MCH: 29.7 pg (ref 26.0–34.0)
MCHC: 34.1 g/dL (ref 30.0–36.0)
MCV: 87.3 fL (ref 80.0–100.0)
Monocytes Absolute: 1.2 10*3/uL — ABNORMAL HIGH (ref 0.1–1.0)
Monocytes Relative: 10 %
Neutro Abs: 8.5 10*3/uL — ABNORMAL HIGH (ref 1.7–7.7)
Neutrophils Relative %: 75 %
Platelets: 172 10*3/uL (ref 150–400)
RBC: 3.16 MIL/uL — ABNORMAL LOW (ref 3.87–5.11)
RDW: 13.2 % (ref 11.5–15.5)
WBC: 11.3 10*3/uL — ABNORMAL HIGH (ref 4.0–10.5)
nRBC: 0 % (ref 0.0–0.2)

## 2024-04-12 LAB — COMPREHENSIVE METABOLIC PANEL WITH GFR
ALT: 67 U/L — ABNORMAL HIGH (ref 0–44)
AST: 63 U/L — ABNORMAL HIGH (ref 15–41)
Albumin: 2.2 g/dL — ABNORMAL LOW (ref 3.5–5.0)
Alkaline Phosphatase: 102 U/L (ref 38–126)
Anion gap: 7 (ref 5–15)
BUN: 10 mg/dL (ref 8–23)
CO2: 25 mmol/L (ref 22–32)
Calcium: 7.7 mg/dL — ABNORMAL LOW (ref 8.9–10.3)
Chloride: 107 mmol/L (ref 98–111)
Creatinine, Ser: 0.72 mg/dL (ref 0.44–1.00)
GFR, Estimated: >60 mL/min (ref >60)
Glucose, Bld: 93 mg/dL (ref 70–99)
Potassium: 3.3 mmol/L — ABNORMAL LOW (ref 3.5–5.1)
Sodium: 139 mmol/L (ref 135–145)
Total Bilirubin: 0.5 mg/dL (ref 0.0–1.2)
Total Protein: 5.5 g/dL — ABNORMAL LOW (ref 6.5–8.1)

## 2024-04-12 LAB — MAGNESIUM: Magnesium: 1.8 mg/dL (ref 1.7–2.4)

## 2024-04-12 MED ORDER — DIPHENHYDRAMINE-ZINC ACETATE 2-0.1 % EX CREA: TOPICAL_CREAM | Freq: Every day | CUTANEOUS | Status: DC | PRN

## 2024-04-12 MED ORDER — POTASSIUM CHLORIDE CRYS ER 20 MEQ PO TBCR
40.0000 meq | EXTENDED_RELEASE_TABLET | Freq: Once | ORAL | Status: AC
  Administered 2024-04-12: 40 meq via ORAL
  Filled 2024-04-12: qty 2

## 2024-04-12 MED ORDER — LACTATED RINGERS IV SOLN: INTRAVENOUS | Status: AC

## 2024-04-12 MED ORDER — MAGNESIUM SULFATE 2 GM/50ML IV SOLN
2.0000 g | Freq: Once | INTRAVENOUS | Status: AC
  Administered 2024-04-12: 2 g via INTRAVENOUS
  Filled 2024-04-12: qty 50

## 2024-04-12 NOTE — Progress Notes (Signed)
 PROGRESS NOTE   Toni Miller  ZOX:[id number] DOB: May 03, 1961 DOA: 04/10/2024 PCP: Towanda Fret, MD   Chief Complaint  Patient presents with   Headache   Level of care: Med-Surg  Brief Admission History:  63 year old female with history of migraine headaches, GAD, GERD, chronic constipation, glaucoma, postmenopausal hot flashes, hyperlipidemia, chronic low back pain with sciatica, mild intermittent asthma presented to the ED by EMS complaining of an occipital headache that has been present for the last 3 days and associated with fever up to 103 degrees.  She reports that EMS found her to be febrile with temp of 103 and she was mildly hypotensive and tachycardic.  She reported that son removed a tick from her left foot about 1 month ago and the tick had been present for about 3 days.  She also reports foul smell coming from urine and she had had some increased urinary frequency and nocturia for past 4-5 days.  The ED provider had recommended an LP to rule out meningitis however patient and family refused.  She is being admitted for further observation and management.     Assessment and Plan:  Prerenal AKI -  RESOLVED  hydrated with IV fluid -- creatinine normalized today   E coli Bacteremia - source coming from URINE - urine culture positive for E coli - follow up ID&sensitivities - continue ceftriaxone  IV every 24 hours with plan to transition to oral cephalexin  starting 04/13/24 and discharge home   Headaches - improving; pt had declined LP on admission   Glaucoma - resumed home eye drop medications    Essential hypertension  - soft BPs  - hydrating   GAD - resume home meds and follow    Elevated LFTs  - suspect could be from a tick borne illness - acute hepatitis panel - negative  - continue IV fluids and recheck in AM with PT/INR - empirically started on oral doxycycline  - LFTs trending down with hydration - possibly from E coli bacteremia   Hyponatremia -  resolved  - from dehydration  - continue IV fluids    Hypokalemia - replacing orally and IV Mg being given   Fever - RESOVED - secondary to E coli bacteremia and UTI - sent labs for lyme disease work up (pending) - empirically treating with doxycycline  - pt/family had refused LP in ED - continue antibiotics   DVT prophylaxis: enoxaparin Code Status: Full  Family Communication:  Disposition: anticipating home tomorrow   Consultants:   Procedures:   Antimicrobials:  Ceftriaxone  6/4>>> Cefepime 6/5-6/5   Subjective: Pt having left wrist pain after fall at home prior to arrival.  Headaches improving.    Objective: Vitals:   04/11/24 0846 04/11/24 1207 04/11/24 2100 04/12/24 0456  BP: 96/68 107/69 97/67 117/78  Pulse: 88 93 82 76  Resp: 19 17 18 18   Temp: 98.1 F (36.7 C) 99.1 F (37.3 C) 98.4 F (36.9 C) 98.3 F (36.8 C)  TempSrc: Oral Oral Oral Oral  SpO2:  98% 97% 98%  Weight:      Height:        Intake/Output Summary (Last 24 hours) at 04/12/2024 1137 Last data filed at 04/12/2024 0541 Gross per 24 hour  Intake 1978.97 ml  Output --  Net 1978.97 ml   Filed Weights   04/10/24 0908  Weight: 53.1 kg   Examination:  General exam: Appears calm and comfortable  Respiratory system: Clear to auscultation. Respiratory effort normal. Cardiovascular system: normal S1 & S2 heard.  No JVD, murmurs, rubs, gallops or clicks. No pedal edema. Gastrointestinal system: Abdomen is nondistended, soft and nontender. No organomegaly or masses felt. Normal bowel sounds heard. Central nervous system: Alert and oriented. No focal neurological deficits. Extremities: left wrist tenderness, no edema seen. FROM left wrist. Symmetric 5 x 5 power. Skin: No rashes, lesions or ulcers. Psychiatry: Judgement and insight appear normal. Mood & affect appropriate.   Data Reviewed: I have personally reviewed following labs and imaging studies  CBC: Recent Labs  Lab 04/10/24 1015  04/11/24 0501 04/12/24 0526  WBC 15.6* 13.5* 11.3*  NEUTROABS 12.8* 10.3* 8.5*  HGB 12.2 10.7* 9.4*  HCT 34.3* 30.5* 27.6*  MCV 86.6 89.7 87.3  PLT 181 163 172    Basic Metabolic Panel: Recent Labs  Lab 04/10/24 1015 04/10/24 1121 04/11/24 0501 04/12/24 0526  NA 131*  --  138 139  K 2.6*  --  3.3* 3.3*  CL 96*  --  105 107  CO2 24  --  24 25  GLUCOSE 130*  --  104* 93  BUN 19  --  12 10  CREATININE 1.49*  --  1.05* 0.72  CALCIUM  8.3*  --  7.8* 7.7*  MG  --  2.1 2.1 1.8    CBG: No results for input(s): "GLUCAP" in the last 168 hours.  Recent Results (from the past 240 hours)  Blood Culture (routine x 2)     Status: None (Preliminary result)   Collection Time: 04/10/24 10:15 AM   Specimen: BLOOD  Result Value Ref Range Status   Specimen Description BLOOD BLOOD LEFT ARM  Final   Special Requests   Final    Blood Culture adequate volume BOTTLES DRAWN AEROBIC AND ANAEROBIC   Culture   Final    NO GROWTH 2 DAYS Performed at [facility], [address] [place], [place] [address]    Report Status PENDING  Incomplete  Blood Culture (routine x 2)     Status: Abnormal (Preliminary result)   Collection Time: 04/10/24 10:20 AM   Specimen: BLOOD  Result Value Ref Range Status   Specimen Description   Final    BLOOD RIGHT ANTECUBITAL Performed at [facility], [address] [place], [place] [address]    Special Requests   Final    BOTTLES DRAWN AEROBIC AND ANAEROBIC Blood Culture results may not be optimal due to an inadequate volume of blood received in culture bottles Performed at [facility], [address] [place], [place] [address]    Culture  Setup Time   Final    GRAM NEGATIVE RODS ANAEROBIC BOTTLE ONLY Gram Stain Report Called to,Read Back By and Verified With: JADA MUSE,RN AT 0232 04/11/24 BY A. SNYDER CRITICAL RESULT CALLED TO, READ BACK BY AND VERIFIED WITH: PHARMD Joey Muster on [id number] @1034  by SM    Culture (A)  Final    ESCHERICHIA  COLI SUSCEPTIBILITIES TO FOLLOW Performed at [facility], [address], [place], [place] [address]    Report Status PENDING  Incomplete  Blood Culture ID Panel (Reflexed)     Status: Abnormal   Collection Time: 04/10/24 10:20 AM  Result Value Ref Range Status   Enterococcus faecalis NOT DETECTED NOT DETECTED Final   Enterococcus Faecium NOT DETECTED NOT DETECTED Final   Listeria monocytogenes NOT DETECTED NOT DETECTED Final   Staphylococcus species NOT DETECTED NOT DETECTED Final   Staphylococcus aureus (BCID) NOT DETECTED NOT DETECTED Final   Staphylococcus epidermidis NOT DETECTED NOT  DETECTED Final   Staphylococcus lugdunensis NOT DETECTED NOT DETECTED Final   Streptococcus species NOT DETECTED NOT DETECTED Final   Streptococcus agalactiae NOT DETECTED NOT DETECTED Final   Streptococcus pneumoniae NOT DETECTED NOT DETECTED Final   Streptococcus pyogenes NOT DETECTED NOT DETECTED Final   A.calcoaceticus-baumannii NOT DETECTED NOT DETECTED Final   Bacteroides fragilis NOT DETECTED NOT DETECTED Final   Enterobacterales DETECTED (A) NOT DETECTED Final    Comment: Enterobacterales represent a large order of gram negative bacteria, not a single organism. CRITICAL RESULT CALLED TO, READ BACK BY AND VERIFIED WITH: Valdene Garret on [id number] @1034  by SM    Enterobacter cloacae complex NOT DETECTED NOT DETECTED Final   Escherichia coli DETECTED (A) NOT DETECTED Final    Comment: CRITICAL RESULT CALLED TO, READ BACK BY AND VERIFIED WITH: Valdene Garret on [id number] @1034  by SM    Klebsiella aerogenes NOT DETECTED NOT DETECTED Final   Klebsiella oxytoca NOT DETECTED NOT DETECTED Final   Klebsiella pneumoniae NOT DETECTED NOT DETECTED Final   Proteus species NOT DETECTED NOT DETECTED Final   Salmonella species NOT DETECTED NOT DETECTED Final   Serratia marcescens NOT DETECTED NOT DETECTED Final   Haemophilus influenzae NOT DETECTED NOT DETECTED Final   Neisseria meningitidis NOT  DETECTED NOT DETECTED Final   Pseudomonas aeruginosa NOT DETECTED NOT DETECTED Final   Stenotrophomonas maltophilia NOT DETECTED NOT DETECTED Final   Candida albicans NOT DETECTED NOT DETECTED Final   Candida auris NOT DETECTED NOT DETECTED Final   Candida glabrata NOT DETECTED NOT DETECTED Final   Candida krusei NOT DETECTED NOT DETECTED Final   Candida parapsilosis NOT DETECTED NOT DETECTED Final   Candida tropicalis NOT DETECTED NOT DETECTED Final   Cryptococcus neoformans/gattii NOT DETECTED NOT DETECTED Final   CTX-M ESBL NOT DETECTED NOT DETECTED Final   Carbapenem resistance IMP NOT DETECTED NOT DETECTED Final   Carbapenem resistance KPC NOT DETECTED NOT DETECTED Final   Carbapenem resistance NDM NOT DETECTED NOT DETECTED Final   Carbapenem resist OXA 48 LIKE NOT DETECTED NOT DETECTED Final   Carbapenem resistance VIM NOT DETECTED NOT DETECTED Final    Comment: Performed at [facility], [address] [place], [place] [address]  Urine Culture     Status: Abnormal (Preliminary result)   Collection Time: 04/10/24 12:09 PM   Specimen: Urine, Clean Catch  Result Value Ref Range Status   Specimen Description URINE, CLEAN CATCH  Final   Special Requests NONE  Final   Culture >=100,000 COLONIES/mL ESCHERICHIA COLI (A)  Final   Report Status PENDING  Incomplete   Organism ID, Bacteria ESCHERICHIA COLI (A)  Final      Susceptibility   Escherichia coli - MIC*    AMPICILLIN >=32 RESISTANT Resistant     CEFAZOLIN <=4 SENSITIVE Sensitive     CEFEPIME <=0.12 SENSITIVE Sensitive     CEFTRIAXONE  <=0.25 SENSITIVE Sensitive     CIPROFLOXACIN  <=0.25 SENSITIVE Sensitive     GENTAMICIN >=16 RESISTANT Resistant     IMIPENEM <=0.25 SENSITIVE Sensitive     NITROFURANTOIN <=16 SENSITIVE Sensitive     TRIMETH/SULFA <=20 SENSITIVE Sensitive     AMPICILLIN/SULBACTAM 8 SENSITIVE Sensitive     PIP/TAZO Value in next row Sensitive ug/mL     <=4 SENSITIVEPerformed at [facility],  [address], [place], [place] [address]    * >=100,000 COLONIES/mL ESCHERICHIA COLI  Resp panel by RT-PCR (RSV, Flu A&B, Covid) Anterior Nasal Swab  Status: None   Collection Time: 04/10/24  1:16 PM   Specimen: Anterior Nasal Swab  Result Value Ref Range Status   SARS Coronavirus 2 by RT PCR NEGATIVE NEGATIVE Final    Comment: (NOTE) SARS-CoV-2 target nucleic acids are NOT DETECTED.  The SARS-CoV-2 RNA is generally detectable in upper respiratory specimens during the acute phase of infection. The lowest concentration of SARS-CoV-2 viral copies this assay can detect is 138 copies/mL. A negative result does not preclude SARS-Cov-2 infection and should not be used as the sole basis for treatment or other patient management decisions. A negative result may occur with  improper specimen collection/handling, submission of specimen other than nasopharyngeal swab, presence of viral mutation(s) within the areas targeted by this assay, and inadequate number of viral copies(<138 copies/mL). A negative result must be combined with clinical observations, patient history, and epidemiological information. The expected result is Negative.  Fact Sheet for Patients:  BloggerCourse.com  Fact Sheet for Healthcare Providers:  SeriousBroker.it  This test is no t yet approved or cleared by the [place]  FDA and  has been authorized for detection and/or diagnosis of SARS-CoV-2 by FDA under an Emergency Use Authorization (EUA). This EUA will remain  in effect (meaning this test can be used) for the duration of the COVID-19 declaration under Section 564(b)(1) of the Act, 21 U.S.C.section 360bbb-3(b)(1), unless the authorization is terminated  or revoked sooner.       Influenza A by PCR NEGATIVE NEGATIVE Final   Influenza B by PCR NEGATIVE NEGATIVE Final    Comment: (NOTE) The Xpert Xpress SARS-CoV-2/FLU/RSV plus assay is intended as an aid in the  diagnosis of influenza from Nasopharyngeal swab specimens and should not be used as a sole basis for treatment. Nasal washings and aspirates are unacceptable for Xpert Xpress SARS-CoV-2/FLU/RSV testing.  Fact Sheet for Patients: BloggerCourse.com  Fact Sheet for Healthcare Providers: SeriousBroker.it  This test is not yet approved or cleared by the [place]  FDA and has been authorized for detection and/or diagnosis of SARS-CoV-2 by FDA under an Emergency Use Authorization (EUA). This EUA will remain in effect (meaning this test can be used) for the duration of the COVID-19 declaration under Section 564(b)(1) of the Act, 21 U.S.C. section 360bbb-3(b)(1), unless the authorization is terminated or revoked.     Resp Syncytial Virus by PCR NEGATIVE NEGATIVE Final    Comment: (NOTE) Fact Sheet for Patients: BloggerCourse.com  Fact Sheet for Healthcare Providers: SeriousBroker.it  This test is not yet approved or cleared by the [place]  FDA and has been authorized for detection and/or diagnosis of SARS-CoV-2 by FDA under an Emergency Use Authorization (EUA). This EUA will remain in effect (meaning this test can be used) for the duration of the COVID-19 declaration under Section 564(b)(1) of the Act, 21 U.S.C. section 360bbb-3(b)(1), unless the authorization is terminated or revoked.  Performed at [facility], [address], [place], [place] [address]      Radiology Studies: DG Wrist 2 Views Left Result Date: 04/12/2024 CLINICAL DATA:  Acute left wrist pain EXAM: LEFT WRIST - 2 VIEW COMPARISON:  None Available. FINDINGS: There is no evidence of fracture or dislocation. There is no evidence of arthropathy or other focal bone abnormality. Soft tissues are unremarkable. IMPRESSION: Negative. Electronically Signed   By: Fredrich Jefferson M.D.   On: 04/12/2024 10:13    Scheduled  Meds:  acetaminophen   650 mg Oral Q6H   Or   acetaminophen   650 mg Rectal Q6H   cycloSPORINE  1 drop Both Eyes BID   doxycycline   100 mg Oral Q12H   enoxaparin (LOVENOX) injection  40 mg Subcutaneous Q24H   feeding supplement  237 mL Oral BID BM   gabapentin   200 mg Oral QHS   latanoprost  1 drop Both Eyes QHS   montelukast   10 mg Oral QHS   pantoprazole  40 mg Oral BID   rosuvastatin   10 mg Oral QHS   venlafaxine  XR  75 mg Oral Q breakfast   Continuous Infusions:  cefTRIAXone  (ROCEPHIN )  IV 2 g (04/11/24 1635)   lactated ringers      magnesium  sulfate bolus IVPB       LOS: 1 day   Time spent: 53 mins  Lark Langenfeld Lincoln Renshaw, MD How to contact the [facility] Attending or Consulting provider 7A - 7P or covering provider during after hours 7P -7A, for this patient?  Check the care team in [facility] and look for a) attending/consulting [facility] provider listed and b) the [facility] team listed Log into [email or web address] to find provider on call.  Locate the [facility] provider you are looking for under [facility] and page to a number that you can be directly reached. If you still have difficulty reaching the provider, please page the [facility] (Director on Call) for the Hospitalists listed on amion for assistance.  04/12/2024, 11:37 AM

## 2024-04-12 NOTE — Care Management Important Message (Signed)
 Important Message  Patient Details  Name: Toni Miller MRN: [id number] Date of Birth: [birth year]   Important Message Given:  Yes - Medicare IM     Wyat Infinger L Maurisa Tesmer 04/12/2024, 2:17 PM

## 2024-04-12 NOTE — Plan of Care (Signed)

## 2024-04-13 DIAGNOSIS — R7401 Elevation of levels of liver transaminase levels: Secondary | ICD-10-CM | POA: Diagnosis not present

## 2024-04-13 DIAGNOSIS — R509 Fever, unspecified: Secondary | ICD-10-CM

## 2024-04-13 DIAGNOSIS — H409 Unspecified glaucoma: Secondary | ICD-10-CM | POA: Diagnosis not present

## 2024-04-13 DIAGNOSIS — N179 Acute kidney failure, unspecified: Secondary | ICD-10-CM | POA: Diagnosis not present

## 2024-04-13 DIAGNOSIS — E871 Hypo-osmolality and hyponatremia: Secondary | ICD-10-CM | POA: Diagnosis not present

## 2024-04-13 LAB — CULTURE, BLOOD (ROUTINE X 2)

## 2024-04-13 LAB — URINE CULTURE: Culture: >=100000 — AB

## 2024-04-13 MED ORDER — DOXYCYCLINE HYCLATE 100 MG PO TABS: 100.0000 mg | ORAL_TABLET | Freq: Two times a day (BID) | ORAL | 0 refills | Status: DC

## 2024-04-13 MED ORDER — POTASSIUM CHLORIDE CRYS ER 20 MEQ PO TBCR: 20.0000 meq | EXTENDED_RELEASE_TABLET | Freq: Every day | ORAL | 0 refills | Status: DC

## 2024-04-13 MED ORDER — MELOXICAM 7.5 MG PO TABS: 7.5000 mg | ORAL_TABLET | Freq: Every day | ORAL | Status: DC | PRN

## 2024-04-13 MED ORDER — CEPHALEXIN 500 MG PO CAPS: 500.0000 mg | ORAL_CAPSULE | Freq: Three times a day (TID) | ORAL | 0 refills | Status: DC

## 2024-04-13 MED ORDER — SACCHAROMYCES BOULARDII 250 MG PO CAPS: 250.0000 mg | ORAL_CAPSULE | Freq: Two times a day (BID) | ORAL | 0 refills | Status: AC

## 2024-04-13 NOTE — Discharge Summary (Signed)
 Physician Discharge Summary  Toni Miller:[id number] DOB: 1961/08/07 DOA: 04/10/2024  PCP: Towanda Fret, MD  Admit date: 04/10/2024 Discharge date: 04/13/2024  Admitted From:  HOME Disposition:  HOME   Recommendations for Outpatient Follow-up:  Follow up with PCP in 1 weeks Please obtain CMP in 1-2 weeks to follow up LFTs Please follow up on repeat BP testing in office (BP meds held due to soft BPs in hospital)  Home Health: NA  Discharge Condition: STABLE   CODE STATUS: FULL DIET: regular heart healthy   Brief Hospitalization Summary: Please see all hospital notes, images, labs for full details of the hospitalization. Admission provider HPI:  63 year old female with history of migraine headaches, GAD, GERD, chronic constipation, glaucoma, postmenopausal hot flashes, hyperlipidemia, chronic low back pain with sciatica, mild intermittent asthma presented to the ED by EMS complaining of an occipital headache that has been present for the last 3 days and associated with fever up to 103 degrees.  She reports that EMS found her to be febrile with temp of 103 and she was mildly hypotensive and tachycardic.  She reported that son removed a tick from her left foot about 1 month ago and the tick had been present for about 3 days.  She also reports foul smell coming from urine and she had had some increased urinary frequency and nocturia for past 4-5 days.  The ED provider had recommended an LP to rule out meningitis however patient and family refused.  She is being admitted for further observation and management.    HOSPITAL COURSE BY LISTED PROBLEMS ADDRESSED  Prerenal AKI -  RESOLVED  hydrated with IV fluid -- creatinine normalized now   E coli Bacteremia - source coming from URINE - urine culture positive for E coli - follow up ID&sensitivities confirmed appropriate antibiotics were used - continue ceftriaxone  IV every 24 hours with plan to transition to oral cephalexin  starting  04/13/24 and discharge home to complete full 7 day course of treatment   Headaches - resolved now; pt had declined LP on admission   Glaucoma - resumed home eye drop medications    Essential hypertension  - soft BPs  - hydrated in hospital  - hold home BP meds at DC due to soft BPs in hospital  - follow up with PCP to recheck and decide if needed to resume   GAD - resumed home meds and follow up with PCP   Elevated LFTs  - suspect could be from a tick borne illness - acute hepatitis panel - tested negative  - treated with IV fluids for supportive measures - empirically started on oral doxycycline  for tick borne illness tx - LFTs trended down with hydration - possibly from E coli bacteremia - recheck LFTs with PCP in 1-2 weeks recommended    Hyponatremia - resolved  - from dehydration  - treated with IV fluids    Hypokalemia - replacing orally and IV Mg was given   Fever - RESOVED - secondary to E coli bacteremia and UTI - sent labs for lyme disease work up (pending) - empirically treating with doxycycline  - pt/family had refused LP in ED - continue antibiotics for full 7 day course     Discharge Diagnoses:  Principal Problem:   AKI (acute kidney injury) (HCC) Active Problems:   Elevated transaminase level   GLAUCOMA   Hypertension   GERD   Headache(784.0)   Chronic constipation   GAD (generalized anxiety disorder)   Fever, unspecified  Hypokalemia   Hyponatremia   Hyperbilirubinemia   E coli bacteremia   Discharge Instructions:  Allergies as of 04/13/2024       Reactions   Acyclovir  And Related Rash        Medication List     STOP taking these medications    triamterene -hydrochlorothiazide 75-50 MG tablet Commonly known as: MAXZIDE       TAKE these medications    cephALEXin  500 MG capsule Commonly known as: KEFLEX  Take 1 capsule (500 mg total) by mouth 3 (three) times daily for 4 days.   cholecalciferol 25 MCG (1000 UNIT)  tablet Commonly known as: VITAMIN D3 Take 1,000 Units by mouth daily.   cyclobenzaprine  5 MG tablet Commonly known as: FLEXERIL  Take 1 tablet (5 mg total) by mouth at bedtime.   cycloSPORINE  0.05 % ophthalmic emulsion Commonly known as: RESTASIS  Place 1 drop into both eyes 2 (two) times daily.   doxycycline  100 MG tablet Commonly known as: VIBRA -TABS Take 1 tablet (100 mg total) by mouth every 12 (twelve) hours for 4 days.   esomeprazole  40 MG capsule Commonly known as: NEXIUM  TAKE 1 CAPSULE BY MOUTH TWICE  DAILY   fluticasone  50 MCG/ACT nasal spray Commonly known as: FLONASE  Place 1 spray into both nostrils daily as needed for allergies or rhinitis.   gabapentin  300 MG capsule Commonly known as: NEURONTIN  Take 1 capsule (300 mg total) by mouth 2 (two) times daily.   meloxicam  7.5 MG tablet Commonly known as: MOBIC  Take 1 tablet (7.5 mg total) by mouth daily as needed for pain. What changed:  when to take this reasons to take this   montelukast  10 MG tablet Commonly known as: SINGULAIR  Take 1 tablet (10 mg total) by mouth daily.   multivitamin capsule Take 1 capsule by mouth daily.   potassium chloride  SA 20 MEQ tablet Commonly known as: KLOR-CON  M Take 1 tablet (20 mEq total) by mouth daily for 5 days.   Premarin  vaginal cream Generic drug: conjugated estrogens  APPLY FINGERTIP AMOUNT VAGINALLY 3 TIMES WEEKLY AS NEEDED   rosuvastatin  10 MG tablet Commonly known as: CRESTOR  Take 1 tablet (10 mg total) by mouth at bedtime.   saccharomyces boulardii 250 MG capsule Commonly known as: Florastor Take 1 capsule (250 mg total) by mouth 2 (two) times daily for 14 days.   Travatan  Z 0.004 % Soln ophthalmic solution Generic drug: Travoprost  (BAK Free) Place 1 drop into both eyes at bedtime.   venlafaxine  XR 75 MG 24 hr capsule Commonly known as: EFFEXOR -XR Take 1 capsule (75 mg total) by mouth daily with breakfast.        Follow-up Information     Towanda Fret, MD. Schedule an appointment as soon as possible for a visit in 1 week(s).   Specialty: Family Medicine Why: Hospital Follow Up Contact information: [address], [address] [place] [address] [phone]                Allergies  Allergen Reactions   Acyclovir  And Related Rash   Allergies as of 04/13/2024       Reactions   Acyclovir  And Related Rash        Medication List     STOP taking these medications    triamterene -hydrochlorothiazide 75-50 MG tablet Commonly known as: MAXZIDE       TAKE these medications    cephALEXin  500 MG capsule Commonly known as: KEFLEX  Take 1 capsule (500 mg total) by mouth 3 (three) times  daily for 4 days.   cholecalciferol 25 MCG (1000 UNIT) tablet Commonly known as: VITAMIN D3 Take 1,000 Units by mouth daily.   cyclobenzaprine  5 MG tablet Commonly known as: FLEXERIL  Take 1 tablet (5 mg total) by mouth at bedtime.   cycloSPORINE  0.05 % ophthalmic emulsion Commonly known as: RESTASIS  Place 1 drop into both eyes 2 (two) times daily.   doxycycline  100 MG tablet Commonly known as: VIBRA -TABS Take 1 tablet (100 mg total) by mouth every 12 (twelve) hours for 4 days.   esomeprazole  40 MG capsule Commonly known as: NEXIUM  TAKE 1 CAPSULE BY MOUTH TWICE  DAILY   fluticasone  50 MCG/ACT nasal spray Commonly known as: FLONASE  Place 1 spray into both nostrils daily as needed for allergies or rhinitis.   gabapentin  300 MG capsule Commonly known as: NEURONTIN  Take 1 capsule (300 mg total) by mouth 2 (two) times daily.   meloxicam  7.5 MG tablet Commonly known as: MOBIC  Take 1 tablet (7.5 mg total) by mouth daily as needed for pain. What changed:  when to take this reasons to take this   montelukast  10 MG tablet Commonly known as: SINGULAIR  Take 1 tablet (10 mg total) by mouth daily.   multivitamin capsule Take 1 capsule by mouth daily.   potassium chloride  SA 20 MEQ tablet Commonly known as: KLOR-CON   M Take 1 tablet (20 mEq total) by mouth daily for 5 days.   Premarin  vaginal cream Generic drug: conjugated estrogens  APPLY FINGERTIP AMOUNT VAGINALLY 3 TIMES WEEKLY AS NEEDED   rosuvastatin  10 MG tablet Commonly known as: CRESTOR  Take 1 tablet (10 mg total) by mouth at bedtime.   saccharomyces boulardii 250 MG capsule Commonly known as: Florastor Take 1 capsule (250 mg total) by mouth 2 (two) times daily for 14 days.   Travatan  Z 0.004 % Soln ophthalmic solution Generic drug: Travoprost  (BAK Free) Place 1 drop into both eyes at bedtime.   venlafaxine  XR 75 MG 24 hr capsule Commonly known as: EFFEXOR -XR Take 1 capsule (75 mg total) by mouth daily with breakfast.        Procedures/Studies: DG Wrist 2 Views Left Result Date: 04/12/2024 CLINICAL DATA:  Acute left wrist pain EXAM: LEFT WRIST - 2 VIEW COMPARISON:  None Available. FINDINGS: There is no evidence of fracture or dislocation. There is no evidence of arthropathy or other focal bone abnormality. Soft tissues are unremarkable. IMPRESSION: Negative. Electronically Signed   By: Fredrich Jefferson M.D.   On: 04/12/2024 10:13   CT Head Wo Contrast Result Date: 04/10/2024 CLINICAL DATA:  Meningitis/CNS infection suspected EXAM: CT HEAD WITHOUT CONTRAST TECHNIQUE: Contiguous axial images were obtained from the base of the skull through the vertex without intravenous contrast. RADIATION DOSE REDUCTION: This exam was performed according to the departmental dose-optimization program which includes automated exposure control, adjustment of the mA and/or kV according to patient size and/or use of iterative reconstruction technique. COMPARISON:  October 17, 2018, Mar 26, 2015 FINDINGS: Brain: The ventricles appear age appropriate. No mass effect or midline shift. Gray-white differentiation is preserved without focal attenuation abnormality.No evidence of acute territorial infarction, extra-axial fluid collection, hemorrhage, or mass lesion.  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 The basilar cisterns are patent without downward herniation. The cerebellar hemispheres and vermis are well formed without mass lesion or focal attenuation abnormality. Vascular: No hyperdense vessel. Skull: Normal. Negative for fracture or focal lesion. Sinuses/Orbits: The paranasal sinuses and mastoids are clear. The globes appear intact. No retrobulbar hematoma.  Other: None. IMPRESSION: No acute intracranial abnormality, specifically, no acute hemorrhage, territorial infarction, or intracranial mass. Electronically Signed   By: Rance Burrows M.D.   On: 04/10/2024 12:47   DG Chest Port 1 View Result Date: 04/10/2024 CLINICAL DATA:  Fever EXAM: PORTABLE CHEST 1 VIEW COMPARISON:  January 26 25 FINDINGS: The heart size and mediastinal contours are within normal limits. Both lungs are clear. The visualized skeletal structures are unremarkable. IMPRESSION: No active disease. Electronically Signed   By: Fredrich Jefferson M.D.   On: 04/10/2024 10:05     Subjective: Pt say she is feeling well today, she is not having a headache   Discharge Exam: Vitals:   04/12/24 2027 04/13/24 0630  BP: 123/76 127/71  Pulse: 79 77  Resp: 17 16  Temp: 98.9 F (37.2 C) 99.1 F (37.3 C)  SpO2: 97% 97%   Vitals:   04/12/24 0456 04/12/24 1421 04/12/24 2027 04/13/24 0630  BP: 117/78 122/78 123/76 127/71  Pulse: 76 87 79 77  Resp: 18  17 16   Temp: 98.3 F (36.8 C) 98.2 F (36.8 C) 98.9 F (37.2 C) 99.1 F (37.3 C)  TempSrc: Oral  Oral Oral  SpO2: 98% 98% 97% 97%  Weight:      Height:       General: Pt is alert, awake, not in acute distress Cardiovascular: RRR, S1/S2 +, no rubs, no gallops Respiratory: CTA bilaterally, no wheezing, no rhonchi Abdominal: Soft, NT, ND, bowel sounds + Extremities: no edema, no cyanosis   The results of significant diagnostics from this hospitalization (including imaging, microbiology, ancillary and  laboratory) are listed below for reference.     Microbiology: Recent Results (from the past 240 hours)  Blood Culture (routine x 2)     Status: None (Preliminary result)   Collection Time: 04/10/24 10:15 AM   Specimen: BLOOD  Result Value Ref Range Status   Specimen Description BLOOD BLOOD LEFT ARM  Final   Special Requests   Final    Blood Culture adequate volume BOTTLES DRAWN AEROBIC AND ANAEROBIC   Culture   Final    NO GROWTH 3 DAYS Performed at [facility], [address] [place], [place] [address]    Report Status PENDING  Incomplete  Blood Culture (routine x 2)     Status: Abnormal (Preliminary result)   Collection Time: 04/10/24 10:20 AM   Specimen: BLOOD  Result Value Ref Range Status   Specimen Description   Final    BLOOD RIGHT ANTECUBITAL Performed at [facility], [address], [place], [place] [address]    Special Requests   Final    BOTTLES DRAWN AEROBIC AND ANAEROBIC Blood Culture results may not be optimal due to an inadequate volume of blood received in culture bottles Performed at [facility], [address], [place], [place] [address]    Culture  Setup Time   Final    GRAM NEGATIVE RODS ANAEROBIC BOTTLE ONLY Gram Stain Report Called to,Read Back By and Verified With: JADA MUSE,RN AT 0232 04/11/24 BY A. SNYDER CRITICAL RESULT CALLED TO, READ BACK BY AND VERIFIED WITH: PHARMD Joey Muster on [id number] @1034  by SM    Culture (A)  Final    ESCHERICHIA COLI SUSCEPTIBILITIES TO FOLLOW Performed at [facility], [address] [place], [place] [address]    Report Status PENDING  Incomplete  Blood Culture ID Panel (Reflexed)     Status: Abnormal   Collection Time: 04/10/24 10:20 AM  Result Value  Ref Range Status   Enterococcus faecalis NOT DETECTED NOT DETECTED Final   Enterococcus Faecium NOT DETECTED NOT DETECTED Final   Listeria monocytogenes NOT DETECTED NOT DETECTED Final   Staphylococcus species NOT DETECTED NOT DETECTED Final    Staphylococcus aureus (BCID) NOT DETECTED NOT DETECTED Final   Staphylococcus epidermidis NOT DETECTED NOT DETECTED Final   Staphylococcus lugdunensis NOT DETECTED NOT DETECTED Final   Streptococcus species NOT DETECTED NOT DETECTED Final   Streptococcus agalactiae NOT DETECTED NOT DETECTED Final   Streptococcus pneumoniae NOT DETECTED NOT DETECTED Final   Streptococcus pyogenes NOT DETECTED NOT DETECTED Final   A.calcoaceticus-baumannii NOT DETECTED NOT DETECTED Final   Bacteroides fragilis NOT DETECTED NOT DETECTED Final   Enterobacterales DETECTED (A) NOT DETECTED Final    Comment: Enterobacterales represent a large order of gram negative bacteria, not a single organism. CRITICAL RESULT CALLED TO, READ BACK BY AND VERIFIED WITH: PHARMD Joey Muster on [id number] @1034  by SM    Enterobacter cloacae complex NOT DETECTED NOT DETECTED Final   Escherichia coli DETECTED (A) NOT DETECTED Final    Comment: CRITICAL RESULT CALLED TO, READ BACK BY AND VERIFIED WITH: Valdene Garret on [id number] @1034  by SM    Klebsiella aerogenes NOT DETECTED NOT DETECTED Final   Klebsiella oxytoca NOT DETECTED NOT DETECTED Final   Klebsiella pneumoniae NOT DETECTED NOT DETECTED Final   Proteus species NOT DETECTED NOT DETECTED Final   Salmonella species NOT DETECTED NOT DETECTED Final   Serratia marcescens NOT DETECTED NOT DETECTED Final   Haemophilus influenzae NOT DETECTED NOT DETECTED Final   Neisseria meningitidis NOT DETECTED NOT DETECTED Final   Pseudomonas aeruginosa NOT DETECTED NOT DETECTED Final   Stenotrophomonas maltophilia NOT DETECTED NOT DETECTED Final   Candida albicans NOT DETECTED NOT DETECTED Final   Candida auris NOT DETECTED NOT DETECTED Final   Candida glabrata NOT DETECTED NOT DETECTED Final   Candida krusei NOT DETECTED NOT DETECTED Final   Candida parapsilosis NOT DETECTED NOT DETECTED Final   Candida tropicalis NOT DETECTED NOT DETECTED Final   Cryptococcus neoformans/gattii NOT DETECTED  NOT DETECTED Final   CTX-M ESBL NOT DETECTED NOT DETECTED Final   Carbapenem resistance IMP NOT DETECTED NOT DETECTED Final   Carbapenem resistance KPC NOT DETECTED NOT DETECTED Final   Carbapenem resistance NDM NOT DETECTED NOT DETECTED Final   Carbapenem resist OXA 48 LIKE NOT DETECTED NOT DETECTED Final   Carbapenem resistance VIM NOT DETECTED NOT DETECTED Final    Comment: Performed at [facility], [address] [place], [place] [address]  Urine Culture     Status: Abnormal   Collection Time: 04/10/24 12:09 PM   Specimen: Urine, Clean Catch  Result Value Ref Range Status   Specimen Description   Final    URINE, CLEAN CATCH Performed at [facility], [address] [place], [place] [address]    Special Requests   Final    NONE Performed at [facility], [address], [place], [place] [address]    Culture (A)  Final    >=100,000 COLONIES/mL ESCHERICHIA COLI 50,000 COLONIES/mL KLEBSIELLA PNEUMONIAE    Report Status 04/13/2024 FINAL  Final   Organism ID, Bacteria ESCHERICHIA COLI (A)  Final   Organism ID, Bacteria KLEBSIELLA PNEUMONIAE (A)  Final      Susceptibility   Escherichia coli - MIC*    AMPICILLIN >=32 RESISTANT Resistant     CEFAZOLIN <=4 SENSITIVE Sensitive     CEFEPIME  <=0.12 SENSITIVE Sensitive  CEFTRIAXONE  <=0.25 SENSITIVE Sensitive     CIPROFLOXACIN  <=0.25 SENSITIVE Sensitive     GENTAMICIN >=16 RESISTANT Resistant     IMIPENEM <=0.25 SENSITIVE Sensitive     NITROFURANTOIN <=16 SENSITIVE Sensitive     TRIMETH/SULFA <=20 SENSITIVE Sensitive     AMPICILLIN/SULBACTAM 8 SENSITIVE Sensitive     PIP/TAZO <=4 SENSITIVE Sensitive ug/mL    * >=100,000 COLONIES/mL ESCHERICHIA COLI   Klebsiella pneumoniae - MIC*    AMPICILLIN RESISTANT Resistant     CEFAZOLIN <=4 SENSITIVE Sensitive     CEFEPIME  <=0.12 SENSITIVE Sensitive     CEFTRIAXONE  <=0.25 SENSITIVE Sensitive     CIPROFLOXACIN  <=0.25 SENSITIVE Sensitive     GENTAMICIN <=1 SENSITIVE Sensitive      IMIPENEM <=0.25 SENSITIVE Sensitive     NITROFURANTOIN 64 INTERMEDIATE Intermediate     TRIMETH/SULFA <=20 SENSITIVE Sensitive     AMPICILLIN/SULBACTAM <=2 SENSITIVE Sensitive     PIP/TAZO <=4 SENSITIVE Sensitive ug/mL    * 50,000 COLONIES/mL KLEBSIELLA PNEUMONIAE  Resp panel by RT-PCR (RSV, Flu A&B, Covid) Anterior Nasal Swab     Status: None   Collection Time: 04/10/24  1:16 PM   Specimen: Anterior Nasal Swab  Result Value Ref Range Status   SARS Coronavirus 2 by RT PCR NEGATIVE NEGATIVE Final    Comment: (NOTE) SARS-CoV-2 target nucleic acids are NOT DETECTED.  The SARS-CoV-2 RNA is generally detectable in upper respiratory specimens during the acute phase of infection. The lowest concentration of SARS-CoV-2 viral copies this assay can detect is 138 copies/mL. A negative result does not preclude SARS-Cov-2 infection and should not be used as the sole basis for treatment or other patient management decisions. A negative result may occur with  improper specimen collection/handling, submission of specimen other than nasopharyngeal swab, presence of viral mutation(s) within the areas targeted by this assay, and inadequate number of viral copies(<138 copies/mL). A negative result must be combined with clinical observations, patient history, and epidemiological information. The expected result is Negative.  Fact Sheet for Patients:  BloggerCourse.com  Fact Sheet for Healthcare Providers:  SeriousBroker.it  This test is no t yet approved or cleared by the [place]  FDA and  has been authorized for detection and/or diagnosis of SARS-CoV-2 by FDA under an Emergency Use Authorization (EUA). This EUA will remain  in effect (meaning this test can be used) for the duration of the COVID-19 declaration under Section 564(b)(1) of the Act, 21 U.S.C.section 360bbb-3(b)(1), unless the authorization is terminated  or revoked sooner.        Influenza A by PCR NEGATIVE NEGATIVE Final   Influenza B by PCR NEGATIVE NEGATIVE Final    Comment: (NOTE) The Xpert Xpress SARS-CoV-2/FLU/RSV plus assay is intended as an aid in the diagnosis of influenza from Nasopharyngeal swab specimens and should not be used as a sole basis for treatment. Nasal washings and aspirates are unacceptable for Xpert Xpress SARS-CoV-2/FLU/RSV testing.  Fact Sheet for Patients: BloggerCourse.com  Fact Sheet for Healthcare Providers: SeriousBroker.it  This test is not yet approved or cleared by the [place]  FDA and has been authorized for detection and/or diagnosis of SARS-CoV-2 by FDA under an Emergency Use Authorization (EUA). This EUA will remain in effect (meaning this test can be used) for the duration of the COVID-19 declaration under Section 564(b)(1) of the Act, 21 U.S.C. section 360bbb-3(b)(1), unless the authorization is terminated or revoked.     Resp Syncytial Virus by PCR NEGATIVE NEGATIVE Final    Comment: (NOTE) Fact Sheet for Patients:  BloggerCourse.com  Fact Sheet for Healthcare Providers: SeriousBroker.it  This test is not yet approved or cleared by the [place]  FDA and has been authorized for detection and/or diagnosis of SARS-CoV-2 by FDA under an Emergency Use Authorization (EUA). This EUA will remain in effect (meaning this test can be used) for the duration of the COVID-19 declaration under Section 564(b)(1) of the Act, 21 U.S.C. section 360bbb-3(b)(1), unless the authorization is terminated or revoked.  Performed at [facility], [address] [place], [place] [address]      Labs: BNP (last 3 results) No results for input(s): "BNP" in the last 8760 hours. Basic Metabolic Panel: Recent Labs  Lab 04/10/24 1015 04/10/24 1121 04/11/24 0501 04/12/24 0526  NA 131*  --  138 139  K 2.6*  --  3.3* 3.3*  CL 96*   --  105 107  CO2 24  --  24 25  GLUCOSE 130*  --  104* 93  BUN 19  --  12 10  CREATININE 1.49*  --  1.05* 0.72  CALCIUM  8.3*  --  7.8* 7.7*  MG  --  2.1 2.1 1.8   Liver Function Tests: Recent Labs  Lab 04/10/24 1015 04/11/24 0501 04/12/24 0526  AST 59* 36 63*  ALT 61* 45* 67*  ALKPHOS 82 83 102  BILITOT 1.4* 0.7 0.5  PROT 7.3 6.1* 5.5*  ALBUMIN 3.4* 2.7* 2.2*   No results for input(s): "LIPASE", "AMYLASE" in the last 168 hours. No results for input(s): "AMMONIA" in the last 168 hours. CBC: Recent Labs  Lab 04/10/24 1015 04/11/24 0501 04/12/24 0526  WBC 15.6* 13.5* 11.3*  NEUTROABS 12.8* 10.3* 8.5*  HGB 12.2 10.7* 9.4*  HCT 34.3* 30.5* 27.6*  MCV 86.6 89.7 87.3  PLT 181 163 172   Cardiac Enzymes: No results for input(s): "CKTOTAL", "CKMB", "CKMBINDEX", "TROPONINI" in the last 168 hours. BNP: Invalid input(s): "POCBNP" CBG: No results for input(s): "GLUCAP" in the last 168 hours. D-Dimer No results for input(s): "DDIMER" in the last 72 hours. Hgb A1c No results for input(s): "HGBA1C" in the last 72 hours. Lipid Profile No results for input(s): "CHOL", "HDL", "LDLCALC", "TRIG", "CHOLHDL", "LDLDIRECT" in the last 72 hours. Thyroid  function studies No results for input(s): "TSH", "T4TOTAL", "T3FREE", "THYROIDAB" in the last 72 hours.  Invalid input(s): "FREET3" Anemia work up No results for input(s): "VITAMINB12", "FOLATE", "FERRITIN", "TIBC", "IRON", "RETICCTPCT" in the last 72 hours. Urinalysis    Component Value Date/Time   COLORURINE AMBER (A) 04/10/2024 1058   APPEARANCEUR CLOUDY (A) 04/10/2024 1058   LABSPEC 1.013 04/10/2024 1058   PHURINE 5.0 04/10/2024 1058   GLUCOSEU NEGATIVE 04/10/2024 1058   HGBUR MODERATE (A) 04/10/2024 1058   BILIRUBINUR NEGATIVE 04/10/2024 1058   BILIRUBINUR neg 08/07/2017 1338   KETONESUR NEGATIVE 04/10/2024 1058   PROTEINUR 100 (A) 04/10/2024 1058   UROBILINOGEN 0.2 08/07/2017 1338   UROBILINOGEN 0.2 11/04/2013 1849    NITRITE NEGATIVE 04/10/2024 1058   LEUKOCYTESUR MODERATE (A) 04/10/2024 1058   Sepsis Labs Recent Labs  Lab 04/10/24 1015 04/11/24 0501 04/12/24 0526  WBC 15.6* 13.5* 11.3*   Microbiology Recent Results (from the past 240 hours)  Blood Culture (routine x 2)     Status: None (Preliminary result)   Collection Time: 04/10/24 10:15 AM   Specimen: BLOOD  Result Value Ref Range Status   Specimen Description BLOOD BLOOD LEFT ARM  Final   Special Requests   Final    Blood Culture adequate volume BOTTLES DRAWN AEROBIC AND  ANAEROBIC   Culture   Final    NO GROWTH 3 DAYS Performed at [facility], [address] [place], [place] [address]    Report Status PENDING  Incomplete  Blood Culture (routine x 2)     Status: Abnormal (Preliminary result)   Collection Time: 04/10/24 10:20 AM   Specimen: BLOOD  Result Value Ref Range Status   Specimen Description   Final    BLOOD RIGHT ANTECUBITAL Performed at [facility], [address] [place], [place] [address]    Special Requests   Final    BOTTLES DRAWN AEROBIC AND ANAEROBIC Blood Culture results may not be optimal due to an inadequate volume of blood received in culture bottles Performed at [facility], [address], [place], [place] [address]    Culture  Setup Time   Final    GRAM NEGATIVE RODS ANAEROBIC BOTTLE ONLY Gram Stain Report Called to,Read Back By and Verified With: JADA MUSE,RN AT 0232 04/11/24 BY A. SNYDER CRITICAL RESULT CALLED TO, READ BACK BY AND VERIFIED WITH: PHARMD Joey Muster on [id number] @1034  by SM    Culture (A)  Final    ESCHERICHIA COLI SUSCEPTIBILITIES TO FOLLOW Performed at [facility], [address] [place], [place] [address]    Report Status PENDING  Incomplete  Blood Culture ID Panel (Reflexed)     Status: Abnormal   Collection Time: 04/10/24 10:20 AM  Result Value Ref Range Status   Enterococcus faecalis NOT DETECTED NOT DETECTED Final   Enterococcus Faecium NOT DETECTED NOT DETECTED  Final   Listeria monocytogenes NOT DETECTED NOT DETECTED Final   Staphylococcus species NOT DETECTED NOT DETECTED Final   Staphylococcus aureus (BCID) NOT DETECTED NOT DETECTED Final   Staphylococcus epidermidis NOT DETECTED NOT DETECTED Final   Staphylococcus lugdunensis NOT DETECTED NOT DETECTED Final   Streptococcus species NOT DETECTED NOT DETECTED Final   Streptococcus agalactiae NOT DETECTED NOT DETECTED Final   Streptococcus pneumoniae NOT DETECTED NOT DETECTED Final   Streptococcus pyogenes NOT DETECTED NOT DETECTED Final   A.calcoaceticus-baumannii NOT DETECTED NOT DETECTED Final   Bacteroides fragilis NOT DETECTED NOT DETECTED Final   Enterobacterales DETECTED (A) NOT DETECTED Final    Comment: Enterobacterales represent a large order of gram negative bacteria, not a single organism. CRITICAL RESULT CALLED TO, READ BACK BY AND VERIFIED WITH: PHARMD Joey Muster on [id number] @1034  by SM    Enterobacter cloacae complex NOT DETECTED NOT DETECTED Final   Escherichia coli DETECTED (A) NOT DETECTED Final    Comment: CRITICAL RESULT CALLED TO, READ BACK BY AND VERIFIED WITH: Valdene Garret on [id number] @1034  by SM    Klebsiella aerogenes NOT DETECTED NOT DETECTED Final   Klebsiella oxytoca NOT DETECTED NOT DETECTED Final   Klebsiella pneumoniae NOT DETECTED NOT DETECTED Final   Proteus species NOT DETECTED NOT DETECTED Final   Salmonella species NOT DETECTED NOT DETECTED Final   Serratia marcescens NOT DETECTED NOT DETECTED Final   Haemophilus influenzae NOT DETECTED NOT DETECTED Final   Neisseria meningitidis NOT DETECTED NOT DETECTED Final   Pseudomonas aeruginosa NOT DETECTED NOT DETECTED Final   Stenotrophomonas maltophilia NOT DETECTED NOT DETECTED Final   Candida albicans NOT DETECTED NOT DETECTED Final   Candida auris NOT DETECTED NOT DETECTED Final   Candida glabrata NOT DETECTED NOT DETECTED Final   Candida krusei NOT DETECTED NOT DETECTED Final   Candida parapsilosis NOT  DETECTED NOT DETECTED Final   Candida tropicalis NOT DETECTED NOT DETECTED Final  Cryptococcus neoformans/gattii NOT DETECTED NOT DETECTED Final   CTX-M ESBL NOT DETECTED NOT DETECTED Final   Carbapenem resistance IMP NOT DETECTED NOT DETECTED Final   Carbapenem resistance KPC NOT DETECTED NOT DETECTED Final   Carbapenem resistance NDM NOT DETECTED NOT DETECTED Final   Carbapenem resist OXA 48 LIKE NOT DETECTED NOT DETECTED Final   Carbapenem resistance VIM NOT DETECTED NOT DETECTED Final    Comment: Performed at [facility], [address], [place], [place] [address]  Urine Culture     Status: Abnormal   Collection Time: 04/10/24 12:09 PM   Specimen: Urine, Clean Catch  Result Value Ref Range Status   Specimen Description   Final    URINE, CLEAN CATCH Performed at [facility], [address] [place], [place] [address]    Special Requests   Final    NONE Performed at [facility], [address] [place], [place] [address]    Culture (A)  Final    >=100,000 COLONIES/mL ESCHERICHIA COLI 50,000 COLONIES/mL KLEBSIELLA PNEUMONIAE    Report Status 04/13/2024 FINAL  Final   Organism ID, Bacteria ESCHERICHIA COLI (A)  Final   Organism ID, Bacteria KLEBSIELLA PNEUMONIAE (A)  Final      Susceptibility   Escherichia coli - MIC*    AMPICILLIN >=32 RESISTANT Resistant     CEFAZOLIN <=4 SENSITIVE Sensitive     CEFEPIME  <=0.12 SENSITIVE Sensitive     CEFTRIAXONE  <=0.25 SENSITIVE Sensitive     CIPROFLOXACIN  <=0.25 SENSITIVE Sensitive     GENTAMICIN >=16 RESISTANT Resistant     IMIPENEM <=0.25 SENSITIVE Sensitive     NITROFURANTOIN <=16 SENSITIVE Sensitive     TRIMETH/SULFA <=20 SENSITIVE Sensitive     AMPICILLIN/SULBACTAM 8 SENSITIVE Sensitive     PIP/TAZO <=4 SENSITIVE Sensitive ug/mL    * >=100,000 COLONIES/mL ESCHERICHIA COLI   Klebsiella pneumoniae - MIC*    AMPICILLIN RESISTANT Resistant     CEFAZOLIN <=4 SENSITIVE Sensitive     CEFEPIME  <=0.12 SENSITIVE Sensitive      CEFTRIAXONE  <=0.25 SENSITIVE Sensitive     CIPROFLOXACIN  <=0.25 SENSITIVE Sensitive     GENTAMICIN <=1 SENSITIVE Sensitive     IMIPENEM <=0.25 SENSITIVE Sensitive     NITROFURANTOIN 64 INTERMEDIATE Intermediate     TRIMETH/SULFA <=20 SENSITIVE Sensitive     AMPICILLIN/SULBACTAM <=2 SENSITIVE Sensitive     PIP/TAZO <=4 SENSITIVE Sensitive ug/mL    * 50,000 COLONIES/mL KLEBSIELLA PNEUMONIAE  Resp panel by RT-PCR (RSV, Flu A&B, Covid) Anterior Nasal Swab     Status: None   Collection Time: 04/10/24  1:16 PM   Specimen: Anterior Nasal Swab  Result Value Ref Range Status   SARS Coronavirus 2 by RT PCR NEGATIVE NEGATIVE Final    Comment: (NOTE) SARS-CoV-2 target nucleic acids are NOT DETECTED.  The SARS-CoV-2 RNA is generally detectable in upper respiratory specimens during the acute phase of infection. The lowest concentration of SARS-CoV-2 viral copies this assay can detect is 138 copies/mL. A negative result does not preclude SARS-Cov-2 infection and should not be used as the sole basis for treatment or other patient management decisions. A negative result may occur with  improper specimen collection/handling, submission of specimen other than nasopharyngeal swab, presence of viral mutation(s) within the areas targeted by this assay, and inadequate number of viral copies(<138 copies/mL). A negative result must be combined with clinical observations, patient history, and epidemiological information. The expected result is Negative.  Fact Sheet for Patients:  BloggerCourse.com  Fact Sheet for Healthcare Providers:  SeriousBroker.it  This test is no t yet approved or cleared by the [place]  FDA and  has been authorized for detection and/or diagnosis of SARS-CoV-2 by FDA under an Emergency Use Authorization (EUA). This EUA will remain  in effect (meaning this test can be used) for the duration of the COVID-19 declaration under  Section 564(b)(1) of the Act, 21 U.S.C.section 360bbb-3(b)(1), unless the authorization is terminated  or revoked sooner.       Influenza A by PCR NEGATIVE NEGATIVE Final   Influenza B by PCR NEGATIVE NEGATIVE Final    Comment: (NOTE) The Xpert Xpress SARS-CoV-2/FLU/RSV plus assay is intended as an aid in the diagnosis of influenza from Nasopharyngeal swab specimens and should not be used as a sole basis for treatment. Nasal washings and aspirates are unacceptable for Xpert Xpress SARS-CoV-2/FLU/RSV testing.  Fact Sheet for Patients: BloggerCourse.com  Fact Sheet for Healthcare Providers: SeriousBroker.it  This test is not yet approved or cleared by the [place]  FDA and has been authorized for detection and/or diagnosis of SARS-CoV-2 by FDA under an Emergency Use Authorization (EUA). This EUA will remain in effect (meaning this test can be used) for the duration of the COVID-19 declaration under Section 564(b)(1) of the Act, 21 U.S.C. section 360bbb-3(b)(1), unless the authorization is terminated or revoked.     Resp Syncytial Virus by PCR NEGATIVE NEGATIVE Final    Comment: (NOTE) Fact Sheet for Patients: BloggerCourse.com  Fact Sheet for Healthcare Providers: SeriousBroker.it  This test is not yet approved or cleared by the [place]  FDA and has been authorized for detection and/or diagnosis of SARS-CoV-2 by FDA under an Emergency Use Authorization (EUA). This EUA will remain in effect (meaning this test can be used) for the duration of the COVID-19 declaration under Section 564(b)(1) of the Act, 21 U.S.C. section 360bbb-3(b)(1), unless the authorization is terminated or revoked.  Performed at [facility], [address] [place], [place] [address]    Time coordinating discharge:  43 mins   SIGNED:  Faustino Hook, MD  [facility] 04/13/2024, 9:13  AM How to contact the [facility] Attending or Consulting provider 7A - 7P or covering provider during after hours 7P -7A, for this patient?  Check the care team in [facility] and look for a) attending/consulting [facility] provider listed and b) the TRH team listed Log into [email or web address] and use [employer]'s universal password to access. If you do not have the password, please contact the hospital operator. Locate the TRH provider you are looking for under [facility] and page to a number that you can be directly reached. If you still have difficulty reaching the provider, please page the [facility] (Director on Call) for the Hospitalists listed on amion for assistance.

## 2024-04-13 NOTE — Discharge Instructions (Signed)
IMPORTANT INFORMATION: PAY CLOSE ATTENTION   PHYSICIAN DISCHARGE INSTRUCTIONS  Follow with Primary care provider  Toni Helper, MD  and other consultants as instructed by your Hospitalist Physician  [place] IF SYMPTOMS COME BACK, WORSEN OR NEW PROBLEM DEVELOPS   Please note: You were cared for by a hospitalist during your hospital stay. Every effort will be made to forward records to your primary care provider.  You can request that your primary care provider send for your hospital records if they have not received them.  Once you are discharged, your primary care physician will handle any further medical issues. Please note that NO REFILLS for any discharge medications will be authorized once you are discharged, as it is imperative that you return to your primary care physician (or establish a relationship with a primary care physician if you do not have one) for your post hospital discharge needs so that they can reassess your need for medications and monitor your lab values.  Please get a complete blood count and chemistry panel checked by your Primary MD at your next visit, and again as instructed by your Primary MD.  Get Medicines reviewed and adjusted: Please take all your medications with you for your next visit with your Primary MD  Laboratory/radiological data: Please request your Primary MD to go over all hospital tests and procedure/radiological results at the follow up, please ask your primary care provider to get all Hospital records sent to his/her office.  In some cases, they will be blood work, cultures and biopsy results pending at the time of your discharge. Please request that your primary care provider follow up on these results.  If you are diabetic, please bring your blood sugar readings with you to your follow up appointment with primary care.    Please call and make your follow up appointments as soon as possible.    Also  Note the following: If you experience worsening of your admission symptoms, develop shortness of breath, life threatening emergency, suicidal or homicidal thoughts you must seek medical attention immediately by calling 911 or calling your MD immediately  if symptoms less severe.  You must read complete instructions/literature along with all the possible adverse reactions/side effects for all the Medicines you take and that have been prescribed to you. Take any new Medicines after you have completely understood and accpet all the possible adverse reactions/side effects.   Do not drive when taking Pain medications or sleeping medications (Benzodiazepines)  Do not take more than prescribed Pain, Sleep and Anxiety Medications. It is not advisable to combine anxiety,sleep and pain medications without talking with your primary care practitioner  Special Instructions: If you have smoked or chewed Tobacco  in the last 2 yrs please stop smoking, stop any regular Alcohol  and or any Recreational drug use.  Wear Seat belts while driving.  Do not drive if taking any narcotic, mind altering or controlled substances or recreational drugs or alcohol.

## 2024-04-15 ENCOUNTER — Telehealth: Payer: Self-pay

## 2024-04-15 ENCOUNTER — Other Ambulatory Visit: Payer: Self-pay | Admitting: Family Medicine

## 2024-04-15 LAB — CULTURE, BLOOD (ROUTINE X 2)
Culture: NO GROWTH
Special Requests: ADEQUATE

## 2024-04-15 MED ORDER — SULFAMETHOXAZOLE-TRIMETHOPRIM 800-160 MG PO TABS
1.0000 | ORAL_TABLET | Freq: Two times a day (BID) | ORAL | 0 refills | Status: AC
Start: 2024-04-15 — End: 2024-04-20

## 2024-04-15 NOTE — Progress Notes (Signed)
 04/15/2024 10:05 AM  Recent discharge from hospital, I received blood culture sensitivities on 04/15/24 reporting E coli in blood had only intermediate sensitivity to cefazolin.  Will change antibiotic therapy to Bactrim DS which reports as sensitive. New Rx sent to pharmacy and called patient with message to stop cephalexin  and change therapy to bactrim DS and pick Rx up from pharmacy.    Olga Berthold, MD How to contact the [facility] Attending or Consulting provider 7A - 7P or covering provider during after hours 7P -7A, for this patient?  Check the care team in [facility] and look for a) attending/consulting TRH provider listed and b) the TRH team listed Log into [email or web address] and use [employer]'s universal password to access. If you do not have the password, please contact the hospital operator. Locate the TR[facility] provider you are looking for under [facility] and page to a number that you can be directly reached. If you still have difficulty reaching the provider, please page the [facility] (Director on Call) for the Hospitalists listed on amion for assistance.

## 2024-04-15 NOTE — Transitions of Care (Post Inpatient/ED Visit) (Signed)
   04/15/2024  Name: Toni Miller MRN: [id number] DOB: [birth year]  Today's TOC FU Call Status: Today's TOC FU Call Status:: Unsuccessful Call (1st Attempt) Unsuccessful Call (1st Attempt) Date: 04/15/24  Attempted to reach the patient regarding the most recent Inpatient/ED visit.  Follow Up Plan: Additional outreach attempts will be made to reach the patient to complete the Transitions of Care (Post Inpatient/ED visit) call.   Orpha Blade, RN, BSN, CEN [employer]- Transition of Care Team.  [facility] [phone]

## 2024-04-16 ENCOUNTER — Telehealth: Payer: Self-pay

## 2024-04-16 NOTE — Transitions of Care (Post Inpatient/ED Visit) (Signed)
 04/16/2024  Name: Toni Miller MRN: [id number] DOB: 1961-09-20  Today's TOC FU Call Status: Today's TOC FU Call Status:: Successful TOC FU Call Completed TOC FU Call Complete Date: 04/16/24 Patient's Name and Date of Birth confirmed.  Transition Care Management Follow-up Telephone Call How have you been since you were released from the hospital?: Better (reports feeling well.) Any questions or concerns?: No  Items Reviewed: Did you receive and understand the discharge instructions provided?: Yes Medications obtained,verified, and reconciled?: Yes (Medications Reviewed) Any new allergies since your discharge?: Yes Dietary orders reviewed?: No Do you have support at home?: Yes People in Home [RPT]: child(ren), adult Name of Support/Comfort Primary Source: Landon Pinion  Medications Reviewed Today: Medications Reviewed Today     Reviewed by Vanetta Generous, RN (Registered Nurse) on 04/16/24 at 1201  Med List Status: <None>   Medication Order Taking? Sig Documenting Provider Last Dose Status Informant  cephALEXin  (KEFLEX ) 500 MG capsule [id number] Yes Take 500 mg by mouth 3 (three) times daily. For 4 days. [provider] Taking Active   cholecalciferol (VITAMIN D3) 25 MCG (1000 UNIT) tablet [id number] Yes Take 1,000 Units by mouth daily. [provider] Taking Active Self, Pharmacy Records, Child  cyclobenzaprine  (FLEXERIL ) 5 MG tablet [id number] Yes Take 1 tablet (5 mg total) by mouth at bedtime. Towanda Fret, MD Taking Active Self, Pharmacy Records, Child  cycloSPORINE  (RESTASIS ) 0.05 % ophthalmic emulsion [id number] Yes Place 1 drop into both eyes 2 (two) times daily. [provider] Taking Active Self, Pharmacy Records, Child  doxycycline  (VIBRAMYCIN ) 100 MG capsule [id number] Yes Take 100 mg by mouth 2 (two) times daily. For 4 days [provider] Taking Active   esomeprazole  (NEXIUM ) 40 MG capsule [id number] Yes TAKE 1 CAPSULE BY MOUTH TWICE   DAILY Towanda Fret, MD Taking Active Self, Pharmacy Records, Child  fluticasone  (FLONASE ) 50 MCG/ACT nasal spray [id number] Yes Place 1 spray into both nostrils daily as needed for allergies or rhinitis. Towanda Fret, MD Taking Active Self, Pharmacy Records, Child  gabapentin  (NEURONTIN ) 300 MG capsule [id number] Yes Take 1 capsule (300 mg total) by mouth 2 (two) times daily. Towanda Fret, MD Taking Active Self, Pharmacy Records, Child  meloxicam  (MOBIC ) 7.5 MG tablet [id number] Yes Take 1 tablet (7.5 mg total) by mouth daily as needed for pain. Rayfield Cairo, MD Taking Active   montelukast  (SINGULAIR ) 10 MG tablet [id number] Yes Take 1 tablet (10 mg total) by mouth daily. Towanda Fret, MD Taking Active Self, Pharmacy Records, Child  Multiple Vitamin (MULTIVITAMIN) capsule [id number]  Take 1 capsule by mouth daily. [provider]  Active Self, Pharmacy Records, Child           Med Note (ROSE, Jacquese Hackman U   Tue Apr 16, 2024 11:41 AM) Takes every other day.   potassium chloride  SA (KLOR-CON  M) 20 MEQ tablet [id number] Yes Take 1 tablet (20 mEq total) by mouth daily for 5 days. Rayfield Cairo, MD Taking Active   PREMARIN  vaginal cream [id number] Yes APPLY FINGERTIP AMOUNT VAGINALLY 3 TIMES WEEKLY AS NEEDED Towanda Fret, MD Taking Active Self, Pharmacy Records, Child  rosuvastatin  (CRESTOR ) 10 MG tablet [id number] Yes Take 1 tablet (10 mg total) by mouth at bedtime. Towanda Fret, MD Taking Active Self, Pharmacy Records, Child  saccharomyces boulardii [facility]) 250 MG capsule [id number] Yes Take 1 capsule (250 mg total) by mouth 2 (two) times daily for 14 days. Rayfield Cairo, MD Taking Active  sulfamethoxazole-trimethoprim (BACTRIM DS) 800-160 MG tablet [id number] No Take 1 tablet by mouth 2 (two) times daily for 5 days.  Patient not taking: Reported on 04/16/2024   Rayfield Cairo, MD Not Taking Active   TRAVATAN  Z 0.004 % SOLN ophthalmic  solution [id number] Yes Place 1 drop into both eyes at bedtime. Towanda Fret, MD Taking Active Self, Pharmacy Records, Child  venlafaxine  XR (EFFEXOR -XR) 75 MG 24 hr capsule [id number] Yes Take 1 capsule (75 mg total) by mouth daily with breakfast. Towanda Fret, MD Taking Active Self, Pharmacy Records, Child            Home Care and Equipment/Supplies: Were Home Health Services Ordered?: No Any new equipment or medical supplies ordered?: No  Functional Questionnaire: Do you need assistance with bathing/showering or dressing?: No Do you need assistance with meal preparation?: No Do you need assistance with eating?: No Do you have difficulty maintaining continence: No Do you need assistance with getting out of bed/getting out of a chair/moving?: No Do you have difficulty managing or taking your medications?: No  Follow up appointments reviewed: PCP Follow-up appointment confirmed?: Yes Date of PCP follow-up appointment?: 04/25/24 Follow-up Provider: FNP  at [facility] Follow-up appointment confirmed?: No Reason Specialist Follow-Up Not Confirmed: Patient has Specialist Provider Number and will Call for Appointment Do you need transportation to your follow-up appointment?: No Do you understand care options if your condition(s) worsen?: Yes-patient verbalized understanding  SDOH Interventions Today    Flowsheet Row Most Recent Value  SDOH Interventions   Food Insecurity Interventions Intervention Not Indicated  Housing Interventions Intervention Not Indicated  Transportation Interventions Intervention Not Indicated  Utilities Interventions Intervention Not Indicated     Patient reports that she is doing well. Reports no urinary problems.  Reports taking her medications as prescribed.   Interventions: Reviewed with patient to continue to take her medications as prescribed.  Reviewed signs or return infection. Encouraged patient to drink plenty of water   and finish her antibiotics.   Reviewed and offered 30 day TOC program and patient declined.  Provided my contact information for patient to call me if needed.  Orpha Blade, RN, BSN, CEN [employer]- Transition of Care Team.  [facility] [phone]

## 2024-04-18 ENCOUNTER — Other Ambulatory Visit: Payer: Self-pay | Admitting: Family Medicine

## 2024-04-18 NOTE — Telephone Encounter (Signed)
 Copied from CRM [id number]. Topic: Clinical - Medication Refill >> Apr 18, 2024  2:07 PM Fredrica W wrote: Medication:  cephALEXin  (KEFLEX ) 500 MG capsule - prescribed by hospital - pt states not really having symptoms but wanted it refills - states she was giving two antibiotics at hospital potassium chloride  SA (KLOR-CON  M) 20 MEQ tablet doxycycline  (VIBRAMYCIN ) 100 MG capsule  Has the patient contacted their pharmacy? No (Agent: If no, request that the patient contact the pharmacy for the refill. If patient does not wish to contact the pharmacy document the reason why and proceed with request.) (Agent: If yes, when and what did the pharmacy advise?)  This is the patient's preferred pharmacy:  [facility] - [place], [place] - [address] [place] [address] Phone: [phone] Fax: [phone]   Is this the correct pharmacy for this prescription? Yes If no, delete pharmacy and type the correct one.   Has the prescription been filled recently? Yes - filled at hospital   Is the patient out of the medication? Yes  Has the patient been seen for an appointment in the last year OR does the patient have an upcoming appointment? Yes  Can we respond through MyChart? No  Agent: Please be advised that Rx refills may take up to 3 business days. We ask that you follow-up with your pharmacy.

## 2024-04-24 ENCOUNTER — Other Ambulatory Visit: Payer: Self-pay

## 2024-04-24 ENCOUNTER — Telehealth: Payer: Self-pay

## 2024-04-24 MED ORDER — MONTELUKAST SODIUM 10 MG PO TABS: 10.0000 mg | ORAL_TABLET | Freq: Every day | ORAL | 3 refills | Status: AC

## 2024-04-24 MED ORDER — ROSUVASTATIN CALCIUM 10 MG PO TABS: 10.0000 mg | ORAL_TABLET | Freq: Every day | ORAL | 2 refills | Status: AC

## 2024-04-24 NOTE — Telephone Encounter (Signed)
 Copied from CRM [id number]. Topic: Clinical - Prescription Issue >> Apr 24, 2024 10:51 AM Felicitas Horse wrote: Reason for CRM: Pt sent a refill request on 06/12 and has not had a response.    Pt would also like a refill on medication  Triamt-hydrochlorothiazide , do not see this on pt chart. Pt would like a refill for all medications needed.    Please reach out [phone]

## 2024-04-25 ENCOUNTER — Ambulatory Visit: Payer: Self-pay

## 2024-04-25 VITALS — BP 136/88 | HR 94 | Ht 61.0 in | Wt 116.1 lb

## 2024-04-25 DIAGNOSIS — M7542 Impingement syndrome of left shoulder: Secondary | ICD-10-CM | POA: Diagnosis not present

## 2024-04-25 DIAGNOSIS — R748 Abnormal levels of other serum enzymes: Secondary | ICD-10-CM | POA: Diagnosis not present

## 2024-04-25 MED ORDER — MELOXICAM 7.5 MG PO TABS
7.5000 mg | ORAL_TABLET | Freq: Every day | ORAL | 3 refills | Status: AC | PRN
Start: 2024-04-25 — End: ?

## 2024-04-25 NOTE — Progress Notes (Signed)
 Established Patient Office Visit  Subjective   Patient ID: Toni Miller, female    DOB: [birth year]  Age: 63 y.o. MRN: [id number]  Chief Complaint  Patient presents with   Medical Management of Chronic Issues    Pt here for Hospital follow up for ecoli    HPI Follow up Hospitalization  Brief Hospitalization Summary for 04/10/24-04/13/24 Please see all hospital notes, images, labs for full details of the hospitalization. HOSPITAL COURSE BY LISTED PROBLEMS ADDRESSED   Prerenal AKI -  RESOLVED  hydrated with IV fluid -- creatinine normalized now   E coli Bacteremia - source coming from URINE - urine culture positive for E coli - follow up ID&sensitivities confirmed appropriate antibiotics were used - continue ceftriaxone  IV every 24 hours with plan to transition to oral cephalexin  starting 04/13/24 and discharge home to complete full 7 day course of treatment   Headaches - resolved now; pt had declined LP on admission   Glaucoma - resumed home eye drop medications    Essential hypertension  - soft BPs  - hydrated in hospital  - hold home BP meds at DC due to soft BPs in hospital  - follow up with PCP to recheck and decide if needed to resume    GAD - resumed home meds and follow up with PCP   Elevated LFTs  - suspect could be from a tick borne illness - acute hepatitis panel - tested negative  - treated with IV fluids for supportive measures - empirically started on oral doxycycline  for tick borne illness tx - LFTs trended down with hydration - possibly from E coli bacteremia - recheck LFTs with PCP in 1-2 weeks recommended    Hyponatremia - resolved  - from dehydration  - treated with IV fluids    Hypokalemia - replacing orally and IV Mg was given   Fever - RESOVED - secondary to E coli bacteremia and UTI - sent labs for lyme disease work up (pending) - empirically treating with doxycycline  - pt/family had refused LP in ED - continue antibiotics for full 7  day course     Discharge Diagnoses:  Principal Problem:   AKI (acute kidney injury) (HCC) Active Problems:   Elevated transaminase level   GLAUCOMA   Hypertension   GERD   Headache(784.0)   Chronic constipation   GAD (generalized anxiety disorder)   Fever, unspecified   Hypokalemia   Hyponatremia   Hyperbilirubinemia   E coli bacteremia  Recommendations for Outpatient Follow-up:  Follow up with PCP in 1 weeks Please obtain CMP in 1-2 weeks to follow up LFTs Please follow up on repeat BP testing in office (BP meds held due to soft BPs in hospital)  Patient Active Problem List   Diagnosis Date Noted   Elevated liver enzymes 04/27/2024   E coli bacteremia 04/11/2024   AKI (acute kidney injury) (HCC) 04/10/2024   Fever, unspecified 04/10/2024   Hypokalemia 04/10/2024   Hyponatremia 04/10/2024   Elevated transaminase level 04/10/2024   Hyperbilirubinemia 04/10/2024   Back pain 03/26/2024   High serum vitamin B12 03/26/2024   Numbness and tingling in left hand 03/26/2024   GAD (generalized anxiety disorder) 01/14/2024   Other spondylosis with radiculopathy, cervical region 12/13/2023   Impingement syndrome of left shoulder 12/13/2023   Breast pain, left 11/23/2023   Cough 09/12/2023   Immunization due 08/23/2023   Bloating 06/08/2023   Hot flashes due to menopause 04/26/2023   Great toe pain, unspecified laterality 04/21/2023  Positive self-administered antigen test for COVID-19 12/23/2022   Syncope and collapse 12/09/2022   Right knee pain 09/07/2021   Headache, variant migraine 07/07/2021   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   Menopausal vaginal dryness 10/09/2019   Knee pain, left 04/26/2019   Left hip pain 04/26/2019   Dyspepsia 12/12/2018   FH:  breast cancer in first degree relative when <34 years old 06/06/2018   Neck pain 04/21/2016   Arthritis of both knees 04/25/2015   Osteopenia 04/02/2014   Depression, major, recurrent, in partial remission (HCC) 01/06/2014   H/O abnormal Pap smear 04/20/2013   Thyroid  nodule 08/16/2011   Right thigh pain 06/01/2011   Chronic constipation 06/01/2011   Low back pain with left-sided sciatica 12/29/2009   Headache(784.0) 09/26/2008   Hyperlipidemia with target LDL less than 100 01/29/2008   GLAUCOMA 01/29/2008   Hypertension 01/29/2008   GERD 01/29/2008      ROS    Objective:     BP 136/88   Pulse 94   Ht 5' 1 (1.549 m)   Wt 116 lb 1.9 oz (52.7 kg)   SpO2 96%   BMI 21.94 kg/m  BP Readings from Last 3 Encounters:  04/25/24 136/88  04/13/24 127/71  03/26/24 114/77      Physical Exam Vitals and nursing note reviewed.  Constitutional:      Appearance: Normal appearance.  HENT:     Head: Normocephalic.   Eyes:     Extraocular Movements: Extraocular movements intact.     Pupils: Pupils are equal, round, and reactive to light.    Cardiovascular:     Rate and Rhythm: Normal rate and regular rhythm.  Pulmonary:     Effort: Pulmonary effort is normal.     Breath sounds: Normal breath sounds.   Musculoskeletal:     Right shoulder: Normal.     Left shoulder: Decreased range of motion. Decreased strength.     Cervical back: Normal range of motion and neck supple.   Skin:    General: Skin is warm and dry.   Neurological:     Mental Status: She is alert and oriented to person, place, and time.   Psychiatric:        Mood and Affect: Mood normal.        Thought Content: Thought content normal.      Results for orders placed or performed in visit on 04/25/24  CMP14+EGFR  Result Value Ref Range   Glucose 76 70 - 99 mg/dL   BUN 23 8 - 27 mg/dL   Creatinine, Ser 8.99 0.57 - 1.00 mg/dL   eGFR 64 >40 fO/fpw/8.26   BUN/Creatinine Ratio 23 12 - 28   Sodium 138  134 - 144 mmol/L   Potassium 5.2 3.5 - 5.2 mmol/L   Chloride 97 96 - 106 mmol/L   CO2 23 20 - 29 mmol/L   Calcium  9.7 8.7 - 10.3 mg/dL   Total Protein 7.6 6.0 - 8.5 g/dL   Albumin 4.6 3.9 - 4.9 g/dL   Globulin, Total 3.0 1.5 - 4.5 g/dL   Bilirubin Total 0.2 0.0 - 1.2 mg/dL   Alkaline Phosphatase 149 (H) 44 - 121 IU/L   AST 17 0 - 40 IU/L   ALT 21 0 - 32 IU/L      The 10-year ASCVD  risk score (Arnett DK, et al., 2019) is: 5.4%    Assessment & Plan:   Problem List Items Addressed This Visit       Musculoskeletal and Integument   Impingement syndrome of left shoulder   Agree to refill Meloxicam  7.5 mg for prn relief of shoulder pain.       Relevant Medications   meloxicam  (MOBIC ) 7.5 MG tablet     Other   Elevated liver enzymes - Primary   Elevated during recent hospitalization.  Will recheck labs as recommended.   F/U according to lab results.       Relevant Orders   CMP14+EGFR (Completed)    No follow-ups on file.    Leita Longs, FNP

## 2024-04-26 LAB — CMP14+EGFR
ALT: 21 IU/L (ref 0–32)
AST: 17 IU/L (ref 0–40)
Albumin: 4.6 g/dL (ref 3.9–4.9)
Alkaline Phosphatase: 149 IU/L — ABNORMAL HIGH (ref 44–121)
BUN/Creatinine Ratio: 23 (ref 12–28)
BUN: 23 mg/dL (ref 8–27)
Bilirubin Total: 0.2 mg/dL (ref 0.0–1.2)
CO2: 23 mmol/L (ref 20–29)
Calcium: 9.7 mg/dL (ref 8.7–10.3)
Chloride: 97 mmol/L (ref 96–106)
Creatinine, Ser: 1 mg/dL (ref 0.57–1.00)
Globulin, Total: 3 g/dL (ref 1.5–4.5)
Glucose: 76 mg/dL (ref 70–99)
Potassium: 5.2 mmol/L (ref 3.5–5.2)
Sodium: 138 mmol/L (ref 134–144)
Total Protein: 7.6 g/dL (ref 6.0–8.5)
eGFR: 64 mL/min/{1.73_m2} (ref >59)

## 2024-04-26 NOTE — Telephone Encounter (Signed)
 Lvm to cb

## 2024-04-27 DIAGNOSIS — R748 Abnormal levels of other serum enzymes: Secondary | ICD-10-CM | POA: Insufficient documentation

## 2024-04-27 NOTE — Assessment & Plan Note (Signed)
 Elevated during recent hospitalization.  Will recheck labs as recommended.   F/U according to lab results.

## 2024-04-27 NOTE — Assessment & Plan Note (Signed)
 Agree to refill Meloxicam  7.5 mg for prn relief of shoulder pain.

## 2024-04-29 NOTE — Telephone Encounter (Signed)
 Lvm to cb

## 2024-04-29 NOTE — Telephone Encounter (Signed)
 Pt informed. Pt states she does not remember the hospital telling her to stop this. Pt wanting clarification on why she is to stop this and if she is supposed to be taking another bp medication in its place? Please advise

## 2024-05-01 ENCOUNTER — Encounter: Payer: Self-pay | Admitting: Surgical

## 2024-05-01 ENCOUNTER — Telehealth: Payer: Self-pay

## 2024-05-01 ENCOUNTER — Ambulatory Visit (INDEPENDENT_AMBULATORY_CARE_PROVIDER_SITE_OTHER): Admitting: Surgical

## 2024-05-01 DIAGNOSIS — M25532 Pain in left wrist: Secondary | ICD-10-CM

## 2024-05-01 NOTE — Telephone Encounter (Signed)
 Pls explain recent blood pressure reading at the office did not warrants blood pressure medication though the lower number was slightly elevated.  Please arrange  r office visit with me in 2 to 4 weeks for blood pressure reassessment She should check blood pressure once weekly at the pharmacy or daily at home, if consistently 145/90  or more, call for nurse visit  in office

## 2024-05-01 NOTE — Progress Notes (Signed)
 Office Visit Note   Patient: Toni Miller           Date of Birth: [birth year]           MRN: [id number] Visit Date: 05/01/2024 Requested by: Antonetta Rollene BRAVO, MD [address] [place],  [place] [address] PCP: Antonetta Rollene BRAVO, MD  Subjective: Chief Complaint  Patient presents with   Other    Left hand and wrist pain for greater than 3 months. Patient reports that her hand has tingling sensations.     HPI: Toni Miller is a 63 y.o. female who presents to the office reporting left hand/wrist pain.  Patient states she has chronic numbness and tingling in her left hand and has been told she has carpal tunnel syndrome.  More recently, she fell on her left arm sustaining a FOOSH injury to the wrist 1 to 2 months ago by her history.  She states that she has had increased wrist pain since this fall and some swelling.  Using wrist brace.  Had radiographs taken on 04/12/2024 demonstrating no fracture or acute osseous injury.  She describes primarily radial sided pain and dorsal wrist pain.  No history of prior hand or wrist surgery.  Takes Tylenol  with some relief.  Has difficulty with twisting bilateral and any rotation of the wrist or even flexion/extension of the wrist.  Taking occasional Tylenol  with mild relief.  Her carpal tunnel symptoms will wake her up from sleep at night with numbness and tingling of all 5 fingers in the hand.  She does not have radicular pain extending through the rest of the arm and only has pain distal to the wrist.  She has had prior cervical spine ESI injections with last injection about 8 to 9 years ago to her memory.  Has had prior nerve conduction study that was actually negative for any pathology on 08/13/2019.  Has occasional spasm in the trapezius and scapular regions but no significant neck pain.  She is on disability for glaucoma and enjoys spending time with cats and dogs that friends have.  No history of diabetes or smoking.  She is right-hand  dominant..                ROS: All systems reviewed are negative as they relate to the chief complaint within the history of present illness.  Patient denies fevers or chills.  Assessment & Plan: Visit Diagnoses:  1. Pain in left wrist     Plan: Patient is a 63 year old female who presents for evaluation of chronic left hand numbness/tingling as well as new left wrist pain following a fall several months ago.  Her symptoms do correlate with carpal tunnel syndrome but she has had nerve conduction study in 2020 that was negative.  Does have a little bit of thenar atrophy compared with the contralateral hand.  However, the bigger concern is the continued wrist pain and swelling since a FOOSH injury that she sustained 1 to 2 months ago.  She has had radiographs on 04/12/2024 demonstrating no acute osseous injury.  She has tenderness over the anatomic snuffbox and residual swelling and stiffness of the wrist concerning for occult fracture or ligamentous injury.  Plan for MRI arthrogram of the left wrist to further evaluate and follow-up after MRI to review results.  Follow-Up Instructions: No follow-ups on file.   Orders:  Orders Placed This Encounter  Procedures   Arthrogram   MR Wrist Left w/ contrast   No  orders of the defined types were placed in this encounter.     Procedures: No procedures performed   Clinical Data: No additional findings.  Objective: Vital Signs: There were no vitals taken for this visit.  Physical Exam:  Constitutional: Patient appears well-developed HEENT:  Head: Normocephalic Eyes:EOM are normal Neck: Normal range of motion Cardiovascular: Normal rate Pulmonary/chest: Effort normal Neurologic: Patient is alert Skin: Skin is warm Psychiatric: Patient has normal mood and affect  Ortho Exam: Ortho exam demonstrates left wrist with mild to moderate swelling compared with the contralateral wrist.  There is decreased passive extension and flexion compared  with the right wrist.  Pain reproduced with passive extension and flexion as well as pronation/supination.  She has tenderness over the distal radius particularly over the radial styloid as well as tenderness in the anatomic snuffbox.  She has tenderness over the scaphoid tubercle as well.  No tenderness over the DRUJ or ulnar fovea.  Negative Tinel sign.  Negative Durkan sign.  Intact EPL, FPL, finger abduction, pronation/supination.  Able to make full fist.  There is slight amount of thenar atrophy compared with the contralateral hand.  Specialty Comments:  No specialty comments available.  Imaging: No results found.   PMFS History: Patient Active Problem List   Diagnosis Date Noted   Elevated liver enzymes 04/27/2024   E coli bacteremia 04/11/2024   AKI (acute kidney injury) (HCC) 04/10/2024   Fever, unspecified 04/10/2024   Hypokalemia 04/10/2024   Hyponatremia 04/10/2024   Elevated transaminase level 04/10/2024   Hyperbilirubinemia 04/10/2024   Back pain 03/26/2024   High serum vitamin B12 03/26/2024   Numbness and tingling in left hand 03/26/2024   GAD (generalized anxiety disorder) 01/14/2024   Other spondylosis with radiculopathy, cervical region 12/13/2023   Impingement syndrome of left shoulder 12/13/2023   Breast pain, left 11/23/2023   Cough 09/12/2023   Immunization due 08/23/2023   Bloating 06/08/2023   Hot flashes due to menopause 04/26/2023   Great toe pain, unspecified laterality 04/21/2023   Positive self-administered antigen test for COVID-19 12/23/2022   Syncope and collapse 12/09/2022   Right knee pain 09/07/2021   Headache, variant migraine 07/07/2021   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   Menopausal vaginal dryness 10/09/2019   Knee pain,  left 04/26/2019   Left hip pain 04/26/2019   Dyspepsia 12/12/2018   FH: breast cancer in first degree relative when <37 years old 06/06/2018   Neck pain 04/21/2016   Arthritis of both knees 04/25/2015   Osteopenia 04/02/2014   Depression, major, recurrent, in partial remission (HCC) 01/06/2014   H/O abnormal Pap smear 04/20/2013   Thyroid  nodule 08/16/2011   Right thigh pain 06/01/2011   Chronic constipation 06/01/2011   Low back pain with left-sided sciatica 12/29/2009   Headache(784.0) 09/26/2008   Hyperlipidemia with target LDL less than 100 01/29/2008   GLAUCOMA 01/29/2008   Hypertension 01/29/2008   GERD 01/29/2008   Past Medical History:  Diagnosis Date   Anxiety    Arthritis    Cancer (HCC) 1993   abnormal pap treated at [facility]   Constipation    Depression    GERD (gastroesophageal reflux disease)    Glaucoma 1993   History of kidney stones    Hot flashes, menopausal 06/12/2011   Hyperlipidemia    Hypertension  Neck pain 05/2008   WITH BULGING DISC-----RECIEVING EPIDURALS    Pinched nerve    back   Sinusitis     Family History  Adopted: Yes  Problem Relation Age of Onset   Alcohol abuse Mother    Stomach cancer Mother 47   Lung cancer Father    Glaucoma Father    Alcohol abuse Father    Seizures Father    Breast cancer Sister 23   Breast cancer Sister 30   Heart disease Sister    Heart attack Sister    Glaucoma Son    Glaucoma Maternal Grandfather    Cancer Maternal Grandfather        poss leukemia   SIDS Brother    Hyperthyroidism Daughter    Seizures Son        as a Development worker, international aid   Kidney disease Maternal Grandmother    Heart disease Maternal Grandmother        Visual merchandiser   Leukemia Maternal Grandmother    Colon cancer Neg Hx     Past Surgical History:  Procedure Laterality Date   ABDOMINAL EXPLORATION SURGERY  age 80   bowel obstruction, [facility]   ABDOMINAL HYSTERECTOMY  2007   [facility], [facility]   BILATERAL EYE SURGERY     for glaucoma   BIOPSY   01/09/2019   Procedure: BIOPSY;  Surgeon: Harvey Margo CROME, MD;  Location: [facility];  Service: Endoscopy;;  gstric    bone removed from under tongue     BREAST BIOPSY Left    benign   BREAST CYST EXCISION Left 2004   BREAST SURGERY Left 2004   left partial mastectomy, [facility]   CATARACT EXTRACTION Right 06/04/2015   CATARACT EXTRACTION W/PHACO  05/16/2011   Procedure: CATARACT EXTRACTION PHACO AND INTRAOCULAR LENS PLACEMENT (IOC);  Surgeon: Cherene Mania;  Location: [facility];  Service: Ophthalmology;  Laterality: Right;   COLONOSCOPY N/A 05/17/2013   Dr. Sharla diverticulosis was noted/small internal hemorrhoids   COLONOSCOPY WITH PROPOFOL  N/A 04/12/2021   Colonoscopy June 2022: small anal sphincter s/p rectal surgery, internal hemorrhoids, one 2 mm polyp in cecum (benign). 10 year surveillance.   CYST REMOVED LEFT BREAST /BENIGN  2005   left, [facility]   ESOPHAGOGASTRODUODENOSCOPY N/A 01/09/2019   normal esophagus. Mild NSAID gastritis s/p biopsy. +H.pylori. treated with amoxicillin , biaxin , Nexium .. ERADICATION DOCUMENTED   EYE SURGERY  2010, 1992 approx   bilateral   POLYPECTOMY  04/12/2021   Procedure: POLYPECTOMY;  Surgeon: Cindie Carlin POUR, DO;  Location: [facility];  Service: Endoscopy;;   REPAIR IMPERFORATE ANUS / ANORECTOPLASTY     per patient   TOTAL ABDOMINAL HYSTERECTOMY W/ BILATERAL SALPINGOOPHORECTOMY  2007   [facility], [facility]   TUBAL LIGATION  1991   Social History   Occupational History   Occupation: UNEMPLOYED 2011    Comment: disability    Employer: DISABLED  Tobacco Use   Smoking status: Former    Current packs/day: 0.00    Average packs/day: 3.0 packs/day for 2.0 years (6.0 ttl pk-yrs)    Types: Cigarettes    Start date: 05/11/1989    Quit date: 05/12/1991    Years since quitting: 32.9   Smokeless tobacco: Never  Vaping Use   Vaping status: Never Used  Substance and Sexual Activity   Alcohol use: Not Currently   Drug use: Never   Sexual activity: Yes    Birth  control/protection: Surgical

## 2024-05-01 NOTE — Telephone Encounter (Signed)
 Copied from CRM [id number]. Topic: Clinical - Medication Question >> May 01, 2024 10:18 AM Toni Miller wrote: Reason for CRM: Patient asking if Dr Antonetta is going to put back on blood pressure meds- please call [phone]

## 2024-05-01 NOTE — Telephone Encounter (Signed)
 She seen Toni Miller on 6/19 and her BP at visit was 136/88. Wants to know if you are going to put her back on blood pressure meds. Pls advise

## 2024-05-02 NOTE — Telephone Encounter (Signed)
 Straight to vm. LVM to CB

## 2024-05-02 NOTE — Telephone Encounter (Signed)
 Appt reminder in mail

## 2024-05-07 ENCOUNTER — Telehealth: Payer: Self-pay

## 2024-05-07 ENCOUNTER — Ambulatory Visit (INDEPENDENT_AMBULATORY_CARE_PROVIDER_SITE_OTHER): Admitting: Psychiatry

## 2024-05-07 DIAGNOSIS — F411 Generalized anxiety disorder: Secondary | ICD-10-CM

## 2024-05-07 NOTE — Telephone Encounter (Signed)
 Copied from CRM [id number]. Topic: General - Other >> May 07, 2024  1:08 PM Marissa P wrote: Reason for CRM: Patient was giving Leotis a call back please follow up
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
## 2024-05-14 ENCOUNTER — Telehealth: Payer: Self-pay | Admitting: Radiology

## 2024-05-14 NOTE — Telephone Encounter (Signed)
 Patient called to follow up about MRI for left wrist. Advised order has been placed looks like no PA is required, noted on 6/30.  Patient still has not received call from [facility] yet to schedule.    Advised patient it would be 2 coordinated appointments to have contrast injected into wrist then immediately sent to MRI department, this may be the delay in scheduling with the past holiday weekend. Patient stated she would wait for scheduling call from [facility].

## 2024-05-14 NOTE — Telephone Encounter (Signed)
 Message sent to April Pait and andree cosgrove advising of the order for [employer].

## 2024-05-16 ENCOUNTER — Ambulatory Visit: Payer: Self-pay

## 2024-05-17 ENCOUNTER — Encounter: Payer: Self-pay | Admitting: Surgical

## 2024-05-22 ENCOUNTER — Ambulatory Visit (HOSPITAL_COMMUNITY)
Admission: RE | Admit: 2024-05-22 | Discharge: 2024-05-22 | Disposition: A | Discharge status: Home / Self Care | Source: Ambulatory Visit | Attending: Surgical | Admitting: Surgical

## 2024-05-22 DIAGNOSIS — S52502A Unspecified fracture of the lower end of left radius, initial encounter for closed fracture: Secondary | ICD-10-CM | POA: Diagnosis not present

## 2024-05-22 DIAGNOSIS — X58XXXA Exposure to other specified factors, initial encounter: Secondary | ICD-10-CM | POA: Diagnosis not present

## 2024-05-22 DIAGNOSIS — M25532 Pain in left wrist: Secondary | ICD-10-CM | POA: Diagnosis not present

## 2024-05-22 DIAGNOSIS — M19032 Primary osteoarthritis, left wrist: Secondary | ICD-10-CM | POA: Diagnosis not present

## 2024-05-22 DIAGNOSIS — S52592A Other fractures of lower end of left radius, initial encounter for closed fracture: Secondary | ICD-10-CM | POA: Diagnosis not present

## 2024-05-22 MED ORDER — GADOBUTROL 1 MMOL/ML IV SOLN
0.5000 mL | Freq: Once | INTRAVENOUS | Status: AC | PRN
  Administered 2024-05-22: 0.5 mL

## 2024-05-22 MED ORDER — IOHEXOL 180 MG/ML  SOLN
10.0000 mL | Freq: Once | INTRAMUSCULAR | Status: AC | PRN
  Administered 2024-05-22: 10 mL

## 2024-05-22 MED ORDER — LIDOCAINE HCL (PF) 1 % IJ SOLN
5.0000 mL | Freq: Once | INTRAMUSCULAR | Status: AC
  Administered 2024-05-22: 5 mL

## 2024-05-22 MED ORDER — LIDOCAINE HCL (PF) 1 % IJ SOLN
INTRAMUSCULAR | Status: AC
  Filled 2024-05-22: qty 5

## 2024-05-24 ENCOUNTER — Ambulatory Visit: Payer: Self-pay | Admitting: Surgical

## 2024-05-24 NOTE — Progress Notes (Signed)
 Attempted to call patient to discuss MRI results.  Could not get a hold of her.  Left voicemail.  Plan to try again later.

## 2024-05-27 ENCOUNTER — Encounter: Payer: Self-pay | Admitting: Surgical

## 2024-05-27 ENCOUNTER — Ambulatory Visit (INDEPENDENT_AMBULATORY_CARE_PROVIDER_SITE_OTHER): Admitting: Surgical

## 2024-05-27 DIAGNOSIS — S52502A Unspecified fracture of the lower end of left radius, initial encounter for closed fracture: Secondary | ICD-10-CM | POA: Diagnosis not present

## 2024-05-27 NOTE — Progress Notes (Signed)
 Can we have Tonita come to clinic this afternoon to discuss scan and likely apply short arm cast?

## 2024-05-27 NOTE — Progress Notes (Signed)
 Office Visit Note   Patient: Toni Miller           Date of Birth: 08-Dec-1960           MRN: [id number] Visit Date: 05/27/2024 Requested by: Antonetta Rollene BRAVO, MD [address], [address] [place],  [place] [address] PCP: Antonetta Rollene BRAVO, MD  Subjective: Chief Complaint  Patient presents with   Left Wrist - Pain, Follow-up    HPI: Toni Miller is a 63 y.o. female who presents to the office for MRI review. Patient denies any changes in symptoms.  Continues to complain mainly of left wrist pain and swelling.  Diffuse wrist pain continues.  Still has some tingling in her fingers.  Here to review MRI scan  MRI results revealed: MR Wrist Left w/ contrast Result Date: 05/23/2024 EXAM DESCRIPTION: MR WRIST LEFT W CONTRAST CLINICAL HISTORY: 63 years, Female, MRI arthrogram left wrist, eval occult fracture vs SL ligament tear COMPARISON: None TECHNIQUE: MRI of the wrist was performed with multiplanar multi sequence imaging according to our usual protocol. FINDINGS: There is an acute/subacute distal radius fracture with moderate marrow edema. The fracture is largely transverse and extends to the distal radial ulnar joint. No other fracture. Mild degenerative change to the lunate with mild marrow edema and a small subchondral cysts. The marrow signal is otherwise unremarkable. There is a small perforation towards the radial attachment of the TFC with extravasation of contrast into the distal radial ulnar joint. The ligaments are otherwise unremarkable. The carpal tunnel and flexor tendons are unremarkable. The extensor tendons and musculature are unremarkable. IMPRESSION: Acute/subacute nondisplaced distal radius fracture which is largely transverse and extends to the distal radial ulnar joint. Moderate to severe marrow edema. Small perforation towards the radial attachment of the TFC with extravasation of contrast into the distal radioulnar joint. This is of uncertain clinical significance.  Mild focal degenerative change to the lunate. Electronically signed by: Reyes Frees MD 05/23/2024 04:31 PM EDT RP Workstation: [id number]                 ROS: All systems reviewed are negative as they relate to the chief complaint within the history of present illness.  Patient denies fevers or chills.  Assessment & Plan: Visit Diagnoses:  1. Closed fracture of distal end of left radius, unspecified fracture morphology, initial encounter     Plan: Toni Miller is a 63 y.o. female who presents to the office for review of left wrist MRI.  She sustained a fall in early June and had radiographs done in the hospital during a hospitalization at [facility].  These left wrist radiographs were negative for any acute abnormality.  She had ongoing wrist swelling and pain and saw me several weeks ago.  We ordered left wrist MRI arthrogram which has now demonstrated subacute nondisplaced distal radius fracture.  Plan to place patient in short arm cast.  She tolerated this well today.  We will see her back in 2 weeks in [place] for clinical recheck with radiographs at that time.  Need to evaluate her soft tissue and potentially consider placing back in short arm cast at that next appointment for another 1 to 2 weeks.  Follow-Up Instructions: No follow-ups on file.   Orders:  No orders of the defined types were placed in this encounter.  No orders of the defined types were placed in this encounter.     Procedures: No procedures performed   Clinical Data: No additional  findings.  Objective: Vital Signs: There were no vitals taken for this visit.  Physical Exam:  Constitutional: Patient appears well-developed HEENT:  Head: Normocephalic Eyes:EOM are normal Neck: Normal range of motion Cardiovascular: Normal rate Pulmonary/chest: Effort normal Neurologic: Patient is alert Skin: Skin is warm Psychiatric: Patient has normal mood and affect  Ortho Exam: Ortho exam demonstrates 2+ radial  pulse of the left upper extremity.  Intact EPL, FPL, finger abduction, grip strength.  She has tenderness fairly diffusely throughout the left wrist with notable swelling compared with the right wrist.  Her maximal tenderness is over the dorsal aspect of the distal radius with a little bit of tenderness over the ulnar fovea as well.  Specialty Comments:  No specialty comments available.  Imaging: No results found.   PMFS History: Patient Active Problem List   Diagnosis Date Noted   Elevated liver enzymes 04/27/2024   E coli bacteremia 04/11/2024   AKI (acute kidney injury) (HCC) 04/10/2024   Fever, unspecified 04/10/2024   Hypokalemia 04/10/2024   Hyponatremia 04/10/2024   Elevated transaminase level 04/10/2024   Hyperbilirubinemia 04/10/2024   Back pain 03/26/2024   High serum vitamin B12 03/26/2024   Numbness and tingling in left hand 03/26/2024   GAD (generalized anxiety disorder) 01/14/2024   Other spondylosis with radiculopathy, cervical region 12/13/2023   Impingement syndrome of left shoulder 12/13/2023   Breast pain, left 11/23/2023   Cough 09/12/2023   Immunization due 08/23/2023   Bloating 06/08/2023   Hot flashes due to menopause 04/26/2023   Great toe pain, unspecified laterality 04/21/2023   Positive self-administered antigen test for COVID-19 12/23/2022   Syncope and collapse 12/09/2022   Right knee pain 09/07/2021   Headache, variant migraine 07/07/2021   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   Menopausal vaginal dryness 10/09/2019   Knee pain, left 04/26/2019   Left hip pain 04/26/2019   Dyspepsia 12/12/2018   FH: breast cancer in first degree relative when <36 years old 06/06/2018   Neck pain 04/21/2016   Arthritis of both knees 04/25/2015    Osteopenia 04/02/2014   Depression, major, recurrent, in partial remission (HCC) 01/06/2014   H/O abnormal Pap smear 04/20/2013   Thyroid  nodule 08/16/2011   Right thigh pain 06/01/2011   Chronic constipation 06/01/2011   Low back pain with left-sided sciatica 12/29/2009   Headache(784.0) 09/26/2008   Hyperlipidemia with target LDL less than 100 01/29/2008   GLAUCOMA 01/29/2008   Hypertension 01/29/2008   GERD 01/29/2008   Past Medical History:  Diagnosis Date   Anxiety    Arthritis    Cancer (HCC) 1993   abnormal pap treated at [facility]   Constipation    Depression    GERD (gastroesophageal reflux disease)    Glaucoma 1993   History of kidney stones    Hot flashes, menopausal 06/12/2011   Hyperlipidemia    Hypertension    Neck pain 05/2008   WITH BULGING DISC-----RECIEVING EPIDURALS    Pinched nerve    back   Sinusitis     Family History  Adopted: Yes  Problem Relation Age of Onset   Alcohol abuse Mother    Stomach cancer Mother 28   Lung cancer Father    Glaucoma Father    Alcohol abuse Father    Seizures Father    Breast cancer Sister  32   Breast cancer Sister 45   Heart disease Sister    Heart attack Sister    Glaucoma Son    Glaucoma Maternal Grandfather    Cancer Maternal Grandfather        poss leukemia   SIDS Brother    Hyperthyroidism Daughter    Seizures Son        as a Development worker, international aid   Kidney disease Maternal Grandmother    Heart disease Maternal Grandmother        Visual merchandiser   Leukemia Maternal Grandmother    Colon cancer Neg Hx     Past Surgical History:  Procedure Laterality Date   ABDOMINAL EXPLORATION SURGERY  age 42   bowel obstruction, [facility]   ABDOMINAL HYSTERECTOMY  2007   [facility], [facility]   BILATERAL EYE SURGERY     for glaucoma   BIOPSY  01/09/2019   Procedure: BIOPSY;  Surgeon: Harvey Margo CROME, MD;  Location: [facility];  Service: Endoscopy;;  gstric    bone removed from under tongue     BREAST BIOPSY Left    benign   BREAST CYST EXCISION  Left 2004   BREAST SURGERY Left 2004   left partial mastectomy, [facility]   CATARACT EXTRACTION Right 06/04/2015   CATARACT EXTRACTION W/PHACO  05/16/2011   Procedure: CATARACT EXTRACTION PHACO AND INTRAOCULAR LENS PLACEMENT (IOC);  Surgeon: Cherene Mania;  Location: [facility] ORS;  Service: Ophthalmology;  Laterality: Right;   COLONOSCOPY N/A 05/17/2013   Dr. Sharla diverticulosis was noted/small internal hemorrhoids   COLONOSCOPY WITH PROPOFOL  N/A 04/12/2021   Colonoscopy June 2022: small anal sphincter s/p rectal surgery, internal hemorrhoids, one 2 mm polyp in cecum (benign). 10 year surveillance.   CYST REMOVED LEFT BREAST /BENIGN  2005   left, [facility]   ESOPHAGOGASTRODUODENOSCOPY N/A 01/09/2019   normal esophagus. Mild NSAID gastritis s/p biopsy. +H.pylori. treated with amoxicillin , biaxin , Nexium .. ERADICATION DOCUMENTED   EYE SURGERY  2010, 1992 approx   bilateral   POLYPECTOMY  04/12/2021   Procedure: POLYPECTOMY;  Surgeon: Cindie Carlin POUR, DO;  Location: [facility];  Service: Endoscopy;;   REPAIR IMPERFORATE ANUS / ANORECTOPLASTY     per patient   TOTAL ABDOMINAL HYSTERECTOMY W/ BILATERAL SALPINGOOPHORECTOMY  2007   [facility], [facility]   TUBAL LIGATION  1991   Social History   Occupational History   Occupation: UNEMPLOYED 2011    Comment: disability    Employer: DISABLED  Tobacco Use   Smoking status: Former    Current packs/day: 0.00    Average packs/day: 3.0 packs/day for 2.0 years (6.0 ttl pk-yrs)    Types: Cigarettes    Start date: 05/11/1989    Quit date: 05/12/1991    Years since quitting: 33.0   Smokeless tobacco: Never  Vaping Use   Vaping status: Never Used  Substance and Sexual Activity   Alcohol use: Not Currently   Drug use: Never   Sexual activity: Yes    Birth control/protection: Surgical

## 2024-05-30 ENCOUNTER — Encounter: Payer: Self-pay | Admitting: Family Medicine

## 2024-05-30 ENCOUNTER — Ambulatory Visit: Admitting: Family Medicine

## 2024-05-30 VITALS — BP 132/84 | HR 81 | Resp 18 | Ht 61.0 in | Wt 118.1 lb

## 2024-05-30 DIAGNOSIS — I10 Essential (primary) hypertension: Secondary | ICD-10-CM | POA: Diagnosis not present

## 2024-05-30 DIAGNOSIS — R7881 Bacteremia: Secondary | ICD-10-CM | POA: Diagnosis not present

## 2024-05-30 DIAGNOSIS — B9620 Unspecified Escherichia coli [E. coli] as the cause of diseases classified elsewhere: Secondary | ICD-10-CM

## 2024-05-30 DIAGNOSIS — N179 Acute kidney failure, unspecified: Secondary | ICD-10-CM | POA: Diagnosis not present

## 2024-05-30 NOTE — Patient Instructions (Addendum)
 Annual exam as cheduled  No need for B P medication at this time  Check and record blood pressure  twice weekly if higher than 140  more than twice call for blod pressure check appointment please  Labs today  Thanks for choosing [facility], we consider it a privelige to serve you.

## 2024-05-30 NOTE — Assessment & Plan Note (Signed)
 RE EVAL LABS

## 2024-05-30 NOTE — Assessment & Plan Note (Signed)
 LEUKOCYTOSIS WITH SHIFT, RE EVAL

## 2024-05-31 ENCOUNTER — Ambulatory Visit: Payer: Self-pay | Admitting: Family Medicine

## 2024-05-31 LAB — CBC WITH DIFFERENTIAL/PLATELET
Basophils Absolute: 0.1 x10E3/uL (ref 0.0–0.2)
Basos: 1 %
EOS (ABSOLUTE): 0.1 x10E3/uL (ref 0.0–0.4)
Eos: 2 %
Hematocrit: 40.6 % (ref 34.0–46.6)
Hemoglobin: 13 g/dL (ref 11.1–15.9)
Immature Grans (Abs): 0 x10E3/uL (ref 0.0–0.1)
Immature Granulocytes: 0 %
Lymphocytes Absolute: 2.2 x10E3/uL (ref 0.7–3.1)
Lymphs: 41 %
MCH: 30 pg (ref 26.6–33.0)
MCHC: 32 g/dL (ref 31.5–35.7)
MCV: 94 fL (ref 79–97)
Monocytes Absolute: 0.5 x10E3/uL (ref 0.1–0.9)
Monocytes: 9 %
Neutrophils Absolute: 2.6 x10E3/uL (ref 1.4–7.0)
Neutrophils: 47 %
Platelets: 249 x10E3/uL (ref 150–450)
RBC: 4.34 x10E6/uL (ref 3.77–5.28)
RDW: 13.3 % (ref 11.7–15.4)
WBC: 5.4 x10E3/uL (ref 3.4–10.8)

## 2024-05-31 LAB — CMP14+EGFR
ALT: 23 IU/L (ref 0–32)
AST: 20 IU/L (ref 0–40)
Albumin: 4.3 g/dL (ref 3.9–4.9)
Alkaline Phosphatase: 73 IU/L (ref 44–121)
BUN/Creatinine Ratio: 21 (ref 12–28)
BUN: 17 mg/dL (ref 8–27)
Bilirubin Total: <0.2 mg/dL (ref 0.0–1.2)
CO2: 25 mmol/L (ref 20–29)
Calcium: 9.1 mg/dL (ref 8.7–10.3)
Chloride: 102 mmol/L (ref 96–106)
Creatinine, Ser: 0.8 mg/dL (ref 0.57–1.00)
Globulin, Total: 2.6 g/dL (ref 1.5–4.5)
Glucose: 77 mg/dL (ref 70–99)
Potassium: 4.1 mmol/L (ref 3.5–5.2)
Sodium: 140 mmol/L (ref 134–144)
Total Protein: 6.9 g/dL (ref 6.0–8.5)
eGFR: 84 mL/min/1.73 (ref >59)

## 2024-06-02 NOTE — Assessment & Plan Note (Signed)
 Normotensive off medication,;lifestyle management only and home BP checks at tleast twice weekly , call for re eval if perisitently above 140 DASH diet and commitment to daily physical activity for a minimum of 30 minutes discussed and encouraged, as a part of hypertension management. The importance of attaining a healthy weight is also discussed.     05/30/2024    1:18 PM 05/30/2024    1:00 PM 04/25/2024   11:00 AM 04/13/2024    6:30 AM 04/12/2024    8:27 PM 04/12/2024    2:21 PM 04/12/2024    4:56 AM  BP/Weight  Systolic BP 132 143 136 127 123 122 117  Diastolic BP 84 83 88 71 76 78 78  Wt. (Lbs)  118.08 116.12      BMI  22.31 kg/m2 21.94 kg/m2

## 2024-06-02 NOTE — Progress Notes (Signed)
   Toni Miller     MRN: [id number]      DOB: 01-04-1961  Chief Complaint  Patient presents with   Hypertension    Pt here to evaluate blood pressure     HPI Toni Miller is here for follow up and specifically re evaluate her blood pressure and potential need for re in instituting meds  Has recorded home blood pressure ;log which reflects most values to be under 140 Generally feels well, no concerns/ complaints ROS Denies recent fever or chills. Denies sinus pressure, nasal congestion, ear pain or sore throat. Denies chest congestion, productive cough or wheezing. Denies chest pains, palpitations and leg swelling Denies abdominal pain, nausea, vomiting,diarrhea or constipation.   Denies dysuria, frequency, hesitancy or incontinence. Denies joint pain, swelling and limitation in mobility. Denies headaches, seizures, numbness, or tingling. Denies uncontrolled depression, anxiety or insomnia. Denies skin break down or rash.   PE  BP 132/84   Pulse 81   Resp 18   Ht 5' 1 (1.549 m)   Wt 118 lb 1.3 oz (53.6 kg)   SpO2 97%   BMI 22.31 kg/m   Patient alert and oriented and in no cardiopulmonary distress.  HEENT: No facial asymmetry, EOMI,     Neck supple .  Chest: Clear to auscultation bilaterally.  CVS: S1, S2 no murmurs, no S3.Regular rate.  ABD: Soft non tender.   Ext: No edema  MS: Adequate ROM spine, shoulders, hips and knees.  Skin: Intact, no ulcerations or rash noted.  Psych: Good eye contact, normal affect. Memory intact not anxious or depressed appearing.  CNS: CN 2-12 intact, power,  normal throughout.no focal deficits noted.   Assessment & Plan  AKI (acute kidney injury) (HCC) RE EVAL LABS  E coli bacteremia LEUKOCYTOSIS WITH SHIFT, RE EVAL   Hypertension Normotensive off medication,;lifestyle management only and home BP checks at tleast twice weekly , call for re eval if perisitently above 140 DASH diet and commitment to daily physical  activity for a minimum of 30 minutes discussed and encouraged, as a part of hypertension management. The importance of attaining a healthy weight is also discussed.     05/30/2024    1:18 PM 05/30/2024    1:00 PM 04/25/2024   11:00 AM 04/13/2024    6:30 AM 04/12/2024    8:27 PM 04/12/2024    2:21 PM 04/12/2024    4:56 AM  BP/Weight  Systolic BP 132 143 136 127 123 122 117  Diastolic BP 84 83 88 71 76 78 78  Wt. (Lbs)  118.08 116.12      BMI  22.31 kg/m2 21.94 kg/m2

## 2024-06-04 ENCOUNTER — Ambulatory Visit: Payer: Self-pay

## 2024-06-04 ENCOUNTER — Telehealth (INDEPENDENT_AMBULATORY_CARE_PROVIDER_SITE_OTHER): Payer: Self-pay

## 2024-06-04 DIAGNOSIS — S52102D Unspecified fracture of upper end of left radius, subsequent encounter for closed fracture with routine healing: Secondary | ICD-10-CM

## 2024-06-04 MED ORDER — TRAMADOL HCL 50 MG PO TABS: 50.0000 mg | ORAL_TABLET | Freq: Three times a day (TID) | ORAL | 0 refills | Status: AC | PRN

## 2024-06-04 NOTE — Progress Notes (Signed)
 Virtual Visit via Video Note  I connected with [name] on 06/04/24 at  1:40 PM EDT by a video enabled telemedicine application and verified that I am speaking with the correct person using two identifiers.  Patient Location: Home Provider Location: Office/Clinic  I discussed the limitations, risks, security, and privacy concerns of performing an evaluation and management service by video and the availability of in person appointments. I also discussed with the patient that there may be a patient responsible charge related to this service. The patient expressed understanding and agreed to proceed.  Subjective: PCP: Antonetta Rollene BRAVO, MD  Chief Complaint  Patient presents with   Wrist Pain   HPI Plan: BERENICE OEHLERT is a 63 y.o. female who sustained a fall in early June and had radiographs done in the hospital during a hospitalization at [facility].  These left wrist radiographs were negative for any acute abnormality.  She had ongoing wrist swelling and pain and saw me several weeks ago.  Her orthopedic, Dr. Shirly, ordered left wrist MRI arthrogram which has now demonstrated subacute nondisplaced distal radius fracture.  She was seen in his office last week, on 05/27/24, and had short arm cast placed.  She tolerated this well.  She has a f/u appt with him on 06/12/24.  She requested a virtual visit today for pain r/t left wrist fracture.  She denies any increase in pain or swelling, but states that she is not getting good pain control with over-the-counter medications.  ROS: Per HPI  Current Outpatient Medications:    traMADol  (ULTRAM ) 50 MG tablet, Take 1 tablet (50 mg total) by mouth every 8 (eight) hours as needed for up to 5 days for moderate pain (pain score 4-6)., Disp: 15 tablet, Rfl: 0   cholecalciferol (VITAMIN D3) 25 MCG (1000 UNIT) tablet, Take 1,000 Units by mouth daily., Disp: , Rfl:    cycloSPORINE  (RESTASIS ) 0.05 % ophthalmic emulsion, Place 1 drop into both  eyes 2 (two) times daily., Disp: , Rfl:    esomeprazole  (NEXIUM ) 40 MG capsule, TAKE 1 CAPSULE BY MOUTH TWICE  DAILY, Disp: 60 capsule, Rfl: 11   fluticasone  (FLONASE ) 50 MCG/ACT nasal spray, Place 1 spray into both nostrils daily as needed for allergies or rhinitis., Disp: 16 mL, Rfl: 2   gabapentin  (NEURONTIN ) 300 MG capsule, Take 1 capsule (300 mg total) by mouth 2 (two) times daily., Disp: 60 capsule, Rfl: 11   meloxicam  (MOBIC ) 7.5 MG tablet, Take 1 tablet (7.5 mg total) by mouth daily as needed for pain., Disp: 30 tablet, Rfl: 3   montelukast  (SINGULAIR ) 10 MG tablet, Take 1 tablet (10 mg total) by mouth daily., Disp: 80 tablet, Rfl: 3   Multiple Vitamin (MULTIVITAMIN) capsule, Take 1 capsule by mouth daily., Disp: , Rfl:    PREMARIN  vaginal cream, APPLY FINGERTIP AMOUNT VAGINALLY 3 TIMES WEEKLY AS NEEDED, Disp: 30 g, Rfl: 2   rosuvastatin  (CRESTOR ) 10 MG tablet, Take 1 tablet (10 mg total) by mouth at bedtime., Disp: 100 tablet, Rfl: 2   TRAVATAN  Z 0.004 % SOLN ophthalmic solution, Place 1 drop into both eyes at bedtime., Disp: , Rfl:    venlafaxine  XR (EFFEXOR -XR) 75 MG 24 hr capsule, Take 1 capsule (75 mg total) by mouth daily with breakfast., Disp: 30 capsule, Rfl: 3  Observations/Objective: There were no vitals filed for this visit. Physical Exam  Assessment and Plan: Closed fracture of proximal end of left radius with routine healing, unspecified fracture morphology, subsequent encounter -  traMADol  HCl; Take 1 tablet (50 mg total) by mouth every 8 (eight) hours as needed for up to 5 days for moderate pain (pain score 4-6).  Dispense: 15 tablet; Refill: 0    Follow Up Instructions: No follow-ups on file.   I discussed the assessment and treatment plan with the patient. The patient was provided an opportunity to ask questions, and all were answered. The patient agreed with the plan and demonstrated an understanding of the instructions.   The patient was advised to call back  or seek an in-person evaluation if the symptoms worsen or if the condition fails to improve as anticipated.  The above assessment and management plan was discussed with the patient. The patient verbalized understanding of and has agreed to the management plan.   Leita Longs, FNP

## 2024-06-04 NOTE — Telephone Encounter (Signed)
 FYI Only or Action Required?: FYI only for provider.  Patient was last seen in primary care on 05/30/2024 by Antonetta Rollene BRAVO, MD.  Called Nurse Triage reporting Pain.  Symptoms began several weeks ago.  Interventions attempted: Other: Went to ortho has a cast on arm.  Symptoms are: Worsening - pain is increasing. [place] if swelling wrist is in cast.   Triage Disposition: See PCP When Office is Open (Within 3 Days)  Patient/caregiver understands and will follow disposition?: Yes - Pt will call ortho and if unable to schedule with them will have VV visit this afternoon. Unable to come into the office.                Copied from CRM #[id number]. Topic: Clinical - Red Word Triage >> Jun 04, 2024 11:56 AM Elle L wrote: Red Word that prompted transfer to Nurse Triage: The patient has a cast on her arm from a fall. However, the pain is worsening. Reason for Disposition  [1] MODERATE pain (e.g., interferes with normal activities) AND [2] present > 3 days  Answer Assessment - Initial Assessment Questions 1. ONSET: When did the pain start?     Pain is worsening 2. LOCATION: Where is the pain located?     Left wrist 3. PAIN: How bad is the pain? (Scale 1-10; or mild, moderate, severe)     moderate 4. WORK OR EXERCISE: Has there been any recent work or exercise that involved this part (i.e., hand or wrist) of the body?     Fracture 5. CAUSE: What do you think is causing the pain?     Movement 6. AGGRAVATING FACTORS: What makes the pain worse? (e.g., using computer)     Movement 7. OTHER SYMPTOMS: Do you have any other symptoms? (e.g., fever, neck pain, numbness or tingling, rash, swelling)     pain  Protocols used: Wrist Pain-A-AH

## 2024-06-11 DIAGNOSIS — H43393 Other vitreous opacities, bilateral: Secondary | ICD-10-CM | POA: Diagnosis not present

## 2024-06-11 DIAGNOSIS — H4089 Other specified glaucoma: Secondary | ICD-10-CM | POA: Diagnosis not present

## 2024-06-11 DIAGNOSIS — Z961 Presence of intraocular lens: Secondary | ICD-10-CM | POA: Diagnosis not present

## 2024-06-12 ENCOUNTER — Ambulatory Visit: Admitting: Surgical

## 2024-06-12 ENCOUNTER — Other Ambulatory Visit: Payer: Self-pay

## 2024-06-12 ENCOUNTER — Ambulatory Visit (INDEPENDENT_AMBULATORY_CARE_PROVIDER_SITE_OTHER): Admitting: Surgical

## 2024-06-12 DIAGNOSIS — S52502A Unspecified fracture of the lower end of left radius, initial encounter for closed fracture: Secondary | ICD-10-CM

## 2024-06-12 DIAGNOSIS — M25532 Pain in left wrist: Secondary | ICD-10-CM

## 2024-06-16 ENCOUNTER — Encounter: Payer: Self-pay | Admitting: Surgical

## 2024-06-16 NOTE — Progress Notes (Signed)
 Post-fracture Visit Note   Patient: Toni Miller           Date of Birth: 05-26-1961           MRN: [id number] Visit Date: 06/12/2024 PCP: Antonetta Rollene BRAVO, MD   Assessment & Plan:  Chief Complaint:  Chief Complaint  Patient presents with   left wrist fracture follow up   Visit Diagnoses:  1. Closed fracture of distal end of left radius, unspecified fracture morphology, initial encounter   2. Pain in left wrist     Plan: Patient is a 63 year old female who presents for reevaluation of subacute left distal radius fracture.  Has been in a cast for 2 weeks.  Returns today for reevaluation with new radiographs.  There is slight displacement compared with initial radiographs from the time of her injury in early June.  No new fracture or dislocation.  Having continued pain with tenderness over the fracture site on exam today.  Swelling in the left wrist is compatible with prior exam.  2+ radial pulse of the left upper extremity.  There are no ulcerations noted at the cast site.  Cast was removed and replaced with new short arm cast.  Plan for another 2 weeks of immobilization and return visit with transition to short arm brace.  If there is any major concern for nonunion at that point or in the future, consider follow-up with Dr. Agarwala.  Follow-Up Instructions: No follow-ups on file.   Orders:  Orders Placed This Encounter  Procedures   DG Wrist Complete Left   No orders of the defined types were placed in this encounter.   Imaging: No results found.  PMFS History: Patient Active Problem List   Diagnosis Date Noted   Elevated liver enzymes 04/27/2024   E coli bacteremia 04/11/2024   AKI (acute kidney injury) (HCC) 04/10/2024   Fever, unspecified 04/10/2024   Hypokalemia 04/10/2024   Hyponatremia 04/10/2024   Elevated transaminase level 04/10/2024   Hyperbilirubinemia 04/10/2024   Back pain 03/26/2024   High serum vitamin B12 03/26/2024   Numbness and tingling in  left hand 03/26/2024   GAD (generalized anxiety disorder) 01/14/2024   Other spondylosis with radiculopathy, cervical region 12/13/2023   Impingement syndrome of left shoulder 12/13/2023   Breast pain, left 11/23/2023   Cough 09/12/2023   Immunization due 08/23/2023   Bloating 06/08/2023   Hot flashes due to menopause 04/26/2023   Great toe pain, unspecified laterality 04/21/2023   Positive self-administered antigen test for COVID-19 12/23/2022   Syncope and collapse 12/09/2022   Right knee pain 09/07/2021   Headache, variant migraine 07/07/2021   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/06/2014   H/O abnormal Pap smear 04/20/2013   Thyroid  nodule 08/16/2011   Right thigh pain 06/01/2011   Chronic constipation 06/01/2011   Low back pain with left-sided sciatica 12/29/2009   Headache(784.0) 09/26/2008   Hyperlipidemia with target LDL less than 100 01/29/2008   GLAUCOMA 01/29/2008  Hypertension 01/29/2008   GERD 01/29/2008   Past Medical History:  Diagnosis Date   Anxiety    Arthritis    Cancer (HCC) 1993   abnormal pap [facility]   Constipation    Depression    GERD (gastroesophageal reflux disease)    Glaucoma 1993   History of kidney stones    Hot flashes, menopausal 06/12/2011   Hyperlipidemia    Hypertension    Neck pain 05/2008   WITH BULGING DISC-----RECIEVING EPIDURALS    Pinched nerve    back   Sinusitis      Family History  Adopted: Yes  Problem Relation Age of Onset   Alcohol abuse Mother    Stomach cancer Mother 13   Lung cancer Father    Glaucoma Father    Alcohol abuse Father    Seizures Father    Breast cancer Sister 62   Breast cancer Sister 35   Heart disease Sister    Heart attack Sister    Glaucoma Son    Glaucoma Maternal Grandfather    Cancer Maternal Grandfather        poss leukemia   SIDS Brother    Hyperthyroidism Daughter    Seizures Son        as a Development worker, international aid   Kidney disease Maternal Grandmother    Heart disease Maternal Grandmother        Visual merchandiser   Leukemia Maternal Grandmother    Colon cancer Neg Hx     Past Surgical History:  Procedure Laterality Date   ABDOMINAL EXPLORATION SURGERY  age 36   bowel obstruction, [facility]   ABDOMINAL HYSTERECTOMY  2007   [facility], [facility]   BILATERAL EYE SURGERY     for glaucoma   BIOPSY  01/09/2019   Procedure: BIOPSY;  Surgeon: Harvey Margo CROME, MD;  Location: [facility];  Service: Endoscopy;;  gstric    bone removed from under tongue     BREAST BIOPSY Left    benign   BREAST CYST EXCISION Left 2004   BREAST SURGERY Left 2004   left partial mastectomy, [facility]   CATARACT EXTRACTION Right 06/04/2015   CATARACT EXTRACTION W/PHACO  05/16/2011   Procedure: CATARACT EXTRACTION PHACO AND INTRAOCULAR LENS PLACEMENT (IOC);  Surgeon: Cherene Mania;  Location: [facility];  Service: Ophthalmology;  Laterality: Right;   COLONOSCOPY N/A 05/17/2013   Dr. Sharla diverticulosis was noted/small internal hemorrhoids   COLONOSCOPY WITH PROPOFOL  N/A 04/12/2021   Colonoscopy June 2022: small anal sphincter s/p rectal surgery, internal hemorrhoids, one 2 mm polyp in cecum (benign). 10 year surveillance.   CYST REMOVED LEFT BREAST /BENIGN  2005   left, [facility]   ESOPHAGOGASTRODUODENOSCOPY N/A 01/09/2019   normal esophagus. Mild NSAID gastritis s/p biopsy. +H.pylori. treated with amoxicillin , biaxin , Nexium .. ERADICATION DOCUMENTED   EYE SURGERY  2010, 1992  approx   bilateral   POLYPECTOMY  04/12/2021   Procedure: POLYPECTOMY;  Surgeon: Cindie Carlin POUR, DO;  Location: [facility];  Service: Endoscopy;;   REPAIR IMPERFORATE ANUS / ANORECTOPLASTY     per patient   TOTAL ABDOMINAL HYSTERECTOMY W/ BILATERAL SALPINGOOPHORECTOMY  2007   [facility], [facility]   TUBAL LIGATION  1991   Social History   Occupational History   Occupation: UNEMPLOYED 2011    Comment: disability    Employer: DISABLED  Tobacco Use   Smoking status: Former    Current packs/day: 0.00    Average packs/day: 3.0 packs/day for 2.0 years (6.0 ttl pk-yrs)  Types: Cigarettes    Start date: 05/11/1989    Quit date: 05/12/1991    Years since quitting: 33.1   Smokeless tobacco: Never  Vaping Use   Vaping status: Never Used  Substance and Sexual Activity   Alcohol use: Not Currently   Drug use: Never   Sexual activity: Yes    Birth control/protection: Surgical

## 2024-06-21 ENCOUNTER — Telehealth: Payer: Self-pay

## 2024-06-21 ENCOUNTER — Ambulatory Visit (INDEPENDENT_AMBULATORY_CARE_PROVIDER_SITE_OTHER): Admitting: Psychiatry

## 2024-06-21 DIAGNOSIS — F411 Generalized anxiety disorder: Secondary | ICD-10-CM | POA: Diagnosis not present

## 2024-06-21 NOTE — Progress Notes (Signed)
 Virtual Visit via Telephone Note  I connected with Toni Miller on 06/21/24 at  8:00 AM EDT by telephone and verified that I am speaking with the correct person using two identifiers.  Location: Patient: Home Provider: Home Office   I discussed the limitations, risks, security and privacy concerns of performing an evaluation and management service by telephone and the availability of in person appointments. I also discussed with the patient that there may be a patient responsible charge related to this service. The patient expressed understanding and agreed to proceed.   I provided 41 minutes of non-face-to-face time during this encounter.   Winton FORBES Rubinstein, LCSW    THERAPIST PROGRESS NOTE  Session Time: Friday  8?15/2025  8:10 AM - 8:51 AM  Participation Level: Active  Behavioral Response: AlertAnxious  Type of Therapy: Individual Therapy  Treatment Goals addressed: Establish therapeutic alliance, learn and implement relaxation techniques  ProgressTowards Goals: Formal treatment plan will be developed next session  Interventions: Supportive/CBT  Summary: Toni Miller is a 63 y.o. female who   is referred for services by Redell Corn, LCSW.   She is a returning patient to this clinician as she was seen about 5 years ago. . She denies any psychiatric hospitalizations.  Patient reports worrying a lot beginning about 4 to 5 months ago due to experiencing bowel issues.  It per her report, issues are better now but memories of BMP done in childhood due to bowel issues have been triggered.  She reports additional stress related to concerns about daughter who has thyroid  issues and her son who has high blood pressure.  Patient worries about how to support and help her children.  Patient reports fatigue, irritability, and sleep difficulty.  She reports additional worries related to her living situation as her apartment needs repair but landlord has not made repairs despite patient's  repeated requests per her report.  Patient last was seen about 6 weeks ago for the assessment appointment.  She reports continued stress since last session. She reports bowel issues have continued to decrease and expresses increased acceptance of her condition.  She reports feeling less down.  She reports additional stress related to a recent fall resulting in fracturing her hand.  Patient now is wearing a cast.  She also reports stress related to having hot flashes and expresses anxiety about coping with these.  She also continues to have concerns about her living situation as landlord still has not made needed repairs.   Suicidal/Homicidal: No      Therapist Response: Reviewed symptoms, gather more information from patient, discussed stressors, facilitated expression of thoughts and feelings, validated feelings, assisted patient identify strengths used in previous adversity and ways to use these strengths in current situation, discussed patient's use of spirituality and her support system, encouraged continued use, assisted patient identify how to use spirituality to develop coping statements, assisted patient identify effects of acceptance of her current condition regarding issues beyond her control, also discussed patient's problem solving efforts, discussed rationale for and developed plan with patient to practice deep breathing 3 to 5 minutes 2 times per day  Plan: Return again in 2 weeks.  Diagnosis: GAD (generalized anxiety disorder)  Collaboration of Care: Primary Care Provider AEB patient sees PCP Dr. Rollene Pesa for medication management  Patient/Guardian was advised Release of Information must be obtained prior to any record release in order to collaborate their care with an outside provider. Patient/Guardian was advised if they have not already done so  to contact the registration department to sign all necessary forms in order for us  to release information regarding their care.    Consent: Patient/Guardian gives verbal consent for treatment and assignment of benefits for services provided during this visit. Patient/Guardian expressed understanding and agreed to proceed.   Winton FORBES Rubinstein, LCSW 06/21/2024

## 2024-06-21 NOTE — Telephone Encounter (Signed)
 Copied from CRM #[id number]. Topic: Clinical - Medical Advice >> Jun 21, 2024 10:11 AM Tonda B wrote: Reason for CRM: pt is calling in saying that she got a call about a life alert and she is wondering if she needs one or not she has some questions concerning the life alert please call patient back  [phone] (M

## 2024-06-24 NOTE — Telephone Encounter (Signed)
 Lvm to cb

## 2024-06-24 NOTE — Telephone Encounter (Signed)
 Done

## 2024-06-26 ENCOUNTER — Ambulatory Visit: Admitting: Surgical

## 2024-06-26 ENCOUNTER — Telehealth: Payer: Self-pay

## 2024-06-26 ENCOUNTER — Other Ambulatory Visit: Payer: Self-pay

## 2024-06-26 ENCOUNTER — Encounter: Payer: Self-pay | Admitting: Surgical

## 2024-06-26 DIAGNOSIS — S52502A Unspecified fracture of the lower end of left radius, initial encounter for closed fracture: Secondary | ICD-10-CM

## 2024-06-26 DIAGNOSIS — M25532 Pain in left wrist: Secondary | ICD-10-CM

## 2024-06-26 NOTE — Telephone Encounter (Signed)
 Pt called stating that she had received a call from us  to schedule a procedure. I looked in her chart and was unable to find the call. I found her appointment for 07/24/24 with Herlene in [place] and made her aware.

## 2024-06-26 NOTE — Progress Notes (Signed)
 Post-fracture Visit Note   Patient: Toni Miller           Date of Birth: 1961/08/22           MRN: [id number] Visit Date: 06/26/2024 PCP: Antonetta Rollene BRAVO, MD   Assessment & Plan:  Chief Complaint:  Chief Complaint  Patient presents with   left wrist fracture follow up   Visit Diagnoses:  1. Pain in left wrist   2. Closed fracture of distal end of left radius, unspecified fracture morphology, initial encounter     Plan: Patient is a 63 year old female who returns for reevaluation of left distal radius fracture.  Has been in short arm cast for total of 4 weeks.  This was discontinued today and new radiographs taken demonstrating no change in fracture alignment since previous radiographs.  She has less tenderness over the fracture site.  Still having some soreness primarily in the small finger and the dorsum of her hand.  She has predictable stiffness of the left wrist joint compared with the right wrist.  Right wrist has extension to 50 degrees and flexion to 70 degrees.  Left wrist has extension to 20 degrees and flexion to 30 degrees.  Plan at this time is to have Danisha work with occupational therapy for left wrist range of motion.  Caution her against any significant lifting more than about 2 pounds.  She will use volar wrist splint at all times aside from sleeping.  Also with her continued numbness and tingling that has been ongoing since before her injury, we will plan for nerve conduction study of the left upper extremity to evaluate for carpal tunnel syndrome.  Follow-up in 4 weeks for clinical recheck to see how occupational therapy is going and how her pain is trending as well as to review nerve conduction study results.  Follow-Up Instructions: No follow-ups on file.   Orders:  Orders Placed This Encounter  Procedures   DG Wrist Complete Left   Ambulatory referral to Physical Medicine Rehab   Ambulatory referral to Occupational Therapy   No orders of the defined  types were placed in this encounter.   Imaging: No results found.  PMFS History: Patient Active Problem List   Diagnosis Date Noted   Elevated liver enzymes 04/27/2024   E coli bacteremia 04/11/2024   AKI (acute kidney injury) (HCC) 04/10/2024   Fever, unspecified 04/10/2024   Hypokalemia 04/10/2024   Hyponatremia 04/10/2024   Elevated transaminase level 04/10/2024   Hyperbilirubinemia 04/10/2024   Back pain 03/26/2024   High serum vitamin B12 03/26/2024   Numbness and tingling in left hand 03/26/2024   GAD (generalized anxiety disorder) 01/14/2024   Other spondylosis with radiculopathy, cervical region 12/13/2023   Impingement syndrome of left shoulder 12/13/2023   Breast pain, left 11/23/2023   Cough 09/12/2023   Immunization due 08/23/2023   Bloating 06/08/2023   Hot flashes due to menopause 04/26/2023   Great toe pain, unspecified laterality 04/21/2023   Positive self-administered antigen test for COVID-19 12/23/2022   Syncope and collapse 12/09/2022   Right knee pain 09/07/2021   Headache, variant migraine 07/07/2021   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   Menopausal vaginal dryness 10/09/2019   Knee pain, left 04/26/2019   Left hip pain 04/26/2019   Dyspepsia 12/12/2018   FH: breast cancer  in first degree relative when <82 years old 06/06/2018   Neck pain 04/21/2016   Arthritis of both knees 04/25/2015   Osteopenia 04/02/2014   Depression, major, recurrent, in partial remission (HCC) 01/06/2014   H/O abnormal Pap smear 04/20/2013   Thyroid  nodule 08/16/2011   Right thigh pain 06/01/2011   Chronic constipation 06/01/2011   Low back pain with left-sided sciatica 12/29/2009   Headache(784.0) 09/26/2008   Hyperlipidemia with target LDL less than 100  01/29/2008   GLAUCOMA 01/29/2008   Hypertension 01/29/2008   GERD 01/29/2008   Past Medical History:  Diagnosis Date   Anxiety    Arthritis    Cancer (HCC) 1993   abnormal pap [facility]   Constipation    Depression    GERD (gastroesophageal reflux disease)    Glaucoma 1993   History of kidney stones    Hot flashes, menopausal 06/12/2011   Hyperlipidemia    Hypertension    Neck pain 05/2008   WITH BULGING DISC-----RECIEVING EPIDURALS    Pinched nerve    back   Sinusitis     Family History  Adopted: Yes  Problem Relation Age of Onset   Alcohol abuse Mother    Stomach cancer Mother 5   Lung cancer Father    Glaucoma Father    Alcohol abuse Father    Seizures Father    Breast cancer Sister 7   Breast cancer Sister 40   Heart disease Sister    Heart attack Sister    Glaucoma Son    Glaucoma Maternal Grandfather    Cancer Maternal Grandfather        poss leukemia   SIDS Brother    Hyperthyroidism Daughter    Seizures Son        as a Development worker, international aid   Kidney disease Maternal Grandmother    Heart disease Maternal Grandmother        Visual merchandiser   Leukemia Maternal Grandmother    Colon cancer Neg Hx     Past Surgical History:  Procedure Laterality Date   ABDOMINAL EXPLORATION SURGERY  age 75   bowel obstruction, [facility]   ABDOMINAL HYSTERECTOMY  2007   [facility], [facility]   BILATERAL EYE SURGERY     for glaucoma   BIOPSY  01/09/2019   Procedure: BIOPSY;  Surgeon: Harvey Margo CROME, MD;  Location: [facility];  Service: Endoscopy;;  gstric    bone removed from under tongue     BREAST BIOPSY Left    benign   BREAST CYST EXCISION Left 2004   BREAST SURGERY Left 2004   left partial mastectomy, [facility]   CATARACT EXTRACTION Right 06/04/2015   CATARACT EXTRACTION W/PHACO  05/16/2011   Procedure: CATARACT EXTRACTION PHACO AND INTRAOCULAR LENS PLACEMENT (IOC);  Surgeon: Cherene Mania;  Location: [facility];  Service: Ophthalmology;  Laterality: Right;   COLONOSCOPY N/A 05/17/2013   Dr.  Sharla diverticulosis was noted/small internal hemorrhoids   COLONOSCOPY WITH PROPOFOL  N/A 04/12/2021   Colonoscopy June 2022: small anal sphincter s/p rectal surgery, internal hemorrhoids, one 2 mm polyp in cecum (benign). 10 year surveillance.   CYST REMOVED LEFT BREAST /BENIGN  2005   left, [facility]   ESOPHAGOGASTRODUODENOSCOPY N/A 01/09/2019   normal esophagus. Mild NSAID gastritis s/p biopsy. +H.pylori. treated with amoxicillin , biaxin , Nexium .. ERADICATION DOCUMENTED   EYE SURGERY  2010, 1992 approx   bilateral   POLYPECTOMY  04/12/2021   Procedure: POLYPECTOMY;  Surgeon: Cindie Carlin POUR, DO;  Location: [facility];  Service:  Endoscopy;;   REPAIR IMPERFORATE ANUS / ANORECTOPLASTY     per patient   TOTAL ABDOMINAL HYSTERECTOMY W/ BILATERAL SALPINGOOPHORECTOMY  2007   [facility], [facility]   TUBAL LIGATION  1991   Social History   Occupational History   Occupation: UNEMPLOYED 2011    Comment: disability    Employer: DISABLED  Tobacco Use   Smoking status: Former    Current packs/day: 0.00    Average packs/day: 3.0 packs/day for 2.0 years (6.0 ttl pk-yrs)    Types: Cigarettes    Start date: 05/11/1989    Quit date: 05/12/1991    Years since quitting: 33.1   Smokeless tobacco: Never  Vaping Use   Vaping status: Never Used  Substance and Sexual Activity   Alcohol use: Not Currently   Drug use: Never   Sexual activity: Yes    Birth control/protection: Surgical

## 2024-06-28 ENCOUNTER — Encounter: Payer: Self-pay | Admitting: Radiology

## 2024-07-05 ENCOUNTER — Ambulatory Visit (HOSPITAL_COMMUNITY): Admitting: Psychiatry

## 2024-07-05 DIAGNOSIS — F411 Generalized anxiety disorder: Secondary | ICD-10-CM | POA: Diagnosis not present

## 2024-07-05 NOTE — Progress Notes (Signed)
 Virtual Visit via Telephone Note  I connected with [name] on 07/05/24 at 9:09 AM by telephone and verified that I am speaking with the correct person using two identifiers.  Location: Patient: Home Provider: Home Office   I discussed the limitations, risks, security and privacy concerns of performing an evaluation and management service by telephone and the availability of in person appointments. I also discussed with the patient that there may be a patient responsible charge related to this service. The patient expressed understanding and agreed to proceed.   I provided 43 minutes of non-face-to-face time during this encounter.   Winton FORBES Rubinstein, LCSW    THERAPIST PROGRESS NOTE  Session Time: Friday  07/05/2024  8:10 AM -  9:53 AM  Participation Level: Active  Behavioral Response: AlertAnxious  Type of Therapy: Individual Therapy  Treatment Goals addressed: Establish therapeutic alliance, learn and implement relaxation techniques  ProgressTowards Goals: Formal treatment plan will be developed next session  Interventions: Supportive/CBT  Summary: Toni Miller is a 63 y.o. female who   is referred for services by Redell Corn, LCSW.   She is a returning patient to this clinician as she was seen about 5 years ago. . She denies any psychiatric hospitalizations.  Patient reports worrying a lot beginning about 4 to 5 months ago due to experiencing bowel issues.  It per her report, issues are better now but memories of BMP done in childhood due to bowel issues have been triggered.  She reports additional stress related to concerns about daughter who has thyroid  issues and her son who has high blood pressure.  Patient worries about how to support and help her children.  Patient reports fatigue, irritability, and sleep difficulty.  She reports additional worries related to her living situation as her apartment needs repair but landlord has not made repairs despite patient's  repeated requests per her report.  Patient last was seen about 2 weeks ago.  Patient reports continued stress and anxiety but coping better since last session.  Patient reports using deep breathing to manage stress and anxiety and reports this has been helpful.  She has experienced 1 nightmare since last session.  She reports increased sadness and frustration regarding having bowel issues but says frequency of accidents have decreased to about 3 times per week.  Per patient's report this triggers memories of her childhood, thoughts of not being worried that he, feelings of shame and embarrassment, and wanting to isolate self.    Suicidal/Homicidal: No      Therapist Response: Reviewed symptoms, discussed stressors, facilitated expression of thoughts and feelings, validated feelings, praised and reinforced patient's efforts to practice deep breathing, discussed effects, assisted patient identify thoughts/feelings/behaviors experienced when she thinks about her medical condition, began to do parts work with patient regarding identifying the child's self and the feelings/thoughts that self experiences, assisted patient verbalize feelings of the child's self, assisted patient identify ways to nurture and have compassion, assisted patient identify strengths, develop plan with patient to utilize strategies discussed in session, also developed plan with patient to continue practicing deep breathing Plan: Return again in 2 weeks.  Diagnosis: Generalized anxiety disorder  Collaboration of Care: Primary Care Provider AEB patient sees PCP Dr. Rollene Pesa for medication management  Patient/Guardian was advised Release of Information must be obtained prior to any record release in order to collaborate their care with an outside provider. Patient/Guardian was advised if they have not already done so to contact the registration department to sign  all necessary forms in order for us  to release information regarding  their care.   Consent: Patient/Guardian gives verbal consent for treatment and assignment of benefits for services provided during this visit. Patient/Guardian expressed understanding and agreed to proceed.   Winton FORBES Rubinstein, LCSW 07/05/2024

## 2024-07-09 ENCOUNTER — Ambulatory Visit (INDEPENDENT_AMBULATORY_CARE_PROVIDER_SITE_OTHER): Admitting: Physical Medicine and Rehabilitation

## 2024-07-09 DIAGNOSIS — M79642 Pain in left hand: Secondary | ICD-10-CM | POA: Diagnosis not present

## 2024-07-09 DIAGNOSIS — R29898 Other symptoms and signs involving the musculoskeletal system: Secondary | ICD-10-CM | POA: Diagnosis not present

## 2024-07-09 DIAGNOSIS — R202 Paresthesia of skin: Secondary | ICD-10-CM | POA: Diagnosis not present

## 2024-07-09 NOTE — Progress Notes (Signed)
 Pain Scale   Average Pain 8 Patient advising she has numbness/tingling, weakness in her left hand and fingers. Patient advising she is Right hand dominate        +Driver, -BT, -Dye Allergies.

## 2024-07-09 NOTE — Progress Notes (Signed)
 Toni Miller - 63 y.o. female MRN [id number]  Date of birth: 1961/08/11  Office Visit Note: Visit Date: 07/09/2024 PCP: Antonetta Rollene BRAVO, MD Referred by: Shirly Carlin CROME, PA-C  Subjective: Chief Complaint  Patient presents with   Left Hand - Pain, Numbness, Weakness   HPI: Toni Miller is a 63 y.o. female who comes in today at the request of Herlene Shirly, PA-C for evaluation and management of chronic, worsening and severe pain, numbness and tingling in the Left upper extremities.  Patient is Right hand dominant.  She is status post left distal radius fracture with routine healing at this point.  Continues to have stiffness and weakness in the left hand.  She reports numbness and tingling feeling since the injury.  She reports that it may be more of the middle 3 digits but it is somewhat global and nondermatomal.  She does not get any frank radicular symptoms down the arms.  No right-sided complaint.  She denies any specific neck pain or other features of this numbness and tingling.  She does have a history of low back pain and neck pain chronically.  She does feel weak in the left hand with grip strength and dropping things.  She has been wearing a splint.        Review of Systems  Musculoskeletal:  Positive for joint pain and neck pain.  Neurological:  Positive for tingling and focal weakness.  All other systems reviewed and are negative.  Otherwise per HPI.  Assessment & Plan: Visit Diagnoses:    ICD-10-CM   1. Paresthesia of skin  R20.2 NCV with EMG (electromyography)    2. Pain in left hand  M79.642     3. Left hand weakness  R29.898        Plan: Impression: Clinically still in recovery from distal radius fracture with stiffness from immobility but also could have underlying carpal tunnel syndrome.  Electrodiagnostic study performed today.  The above electrodiagnostic study is ABNORMAL and reveals evidence of a mild left median nerve entrapment at the wrist  (carpal tunnel syndrome) affecting sensory components.   There is no significant electrodiagnostic evidence of any other focal nerve entrapment, brachial plexopathy or cervical radiculopathy.   Recommendations: 1.  Follow-up with referring physician. 2.  Continue current management of symptoms. 3.  Continue use of resting splint at night-time and as needed during the day.  Meds & Orders: No orders of the defined types were placed in this encounter.   Orders Placed This Encounter  Procedures   NCV with EMG (electromyography)    Follow-up: Return for [employer], PA-C.   Procedures: No procedures performed  EMG & NCV Findings: Evaluation of the left median (across palm) sensory nerve showed prolonged distal peak latency (Wrist, 4.0 ms) and prolonged distal peak latency (Palm, 2.2 ms).  All remaining nerves (as indicated in the following tables) were within normal limits.    All examined muscles (as indicated in the following table) showed no evidence of electrical instability.    Impression: The above electrodiagnostic study is ABNORMAL and reveals evidence of a mild left median nerve entrapment at the wrist (carpal tunnel syndrome) affecting sensory components.   There is no significant electrodiagnostic evidence of any other focal nerve entrapment, brachial plexopathy or cervical radiculopathy.   Recommendations: 1.  Follow-up with referring physician. 2.  Continue current management of symptoms. 3.  Continue use of resting splint at night-time and as needed during the day.  ___________________________ Prentice  Eldonna BETTERS Board Certified, American Board of Physical Medicine and Rehabilitation    Nerve Conduction Studies Anti Sensory Summary Table   Stim Site NR Peak (ms) Norm Peak (ms) P-T Amp (V) Norm P-T Amp Site1 Site2 Delta-P (ms) Dist (cm) Vel (m/s) Norm Vel (m/s)  Left Median Acr Palm Anti Sensory (2nd Digit)  28.6C  Wrist    *4.0 <3.6 65.2 >10 Wrist Palm 1.8 0.0     Palm    *2.2 <2.0 79.5         Left Radial Anti Sensory (Base 1st Digit)  28.6C  Wrist    2.3 <3.1 38.8  Wrist Base 1st Digit 2.3 0.0    Left Ulnar Anti Sensory (5th Digit)  29.1C  Wrist    3.7 <3.7 46.6 >15.0 Wrist 5th Digit 3.7 14.0 38 >38   Motor Summary Table   Stim Site NR Onset (ms) Norm Onset (ms) O-P Amp (mV) Norm O-P Amp Site1 Site2 Delta-0 (ms) Dist (cm) Vel (m/s) Norm Vel (m/s)  Left Median Motor (Abd Poll Brev)  29.2C  Wrist    3.6 <4.2 7.3 >5 Elbow Wrist 3.3 18.5 56 >50  Elbow    6.9  7.4         Left Ulnar Motor (Abd Dig Min)  29.4C  Wrist    3.4 <4.2 14.0 >3 B Elbow Wrist 2.9 17.0 59 >53  B Elbow    6.3  12.9  A Elbow B Elbow 1.4 9.0 64 >53  A Elbow    7.7  12.6          EMG   Side Muscle Nerve Root Ins Act Fibs Psw Amp Dur Poly Recrt Int Bruna Comment  Left Abd Poll Brev Median C8-T1 Nml Nml Nml Nml Nml 0 Nml Nml   Left 1stDorInt Ulnar C8-T1 Nml Nml Nml Nml Nml 0 Nml Nml   Left PronatorTeres Median C6-7 Nml Nml Nml Nml Nml 0 Nml Nml   Left Biceps Musculocut C5-6 Nml Nml Nml Nml Nml 0 Nml Nml   Left Deltoid Axillary C5-6 Nml Nml Nml Nml Nml 0 Nml Nml     Nerve Conduction Studies Anti Sensory Left/Right Comparison   Stim Site L Lat (ms) R Lat (ms) L-R Lat (ms) L Amp (V) R Amp (V) L-R Amp (%) Site1 Site2 L Vel (m/s) R Vel (m/s) L-R Vel (m/s)  Median Acr Palm Anti Sensory (2nd Digit)  28.6C  Wrist *4.0   65.2   Wrist Palm     Palm *2.2   79.5         Radial Anti Sensory (Base 1st Digit)  28.6C  Wrist 2.3   38.8   Wrist Base 1st Digit     Ulnar Anti Sensory (5th Digit)  29.1C  Wrist 3.7   46.6   Wrist 5th Digit 38     Motor Left/Right Comparison   Stim Site L Lat (ms) R Lat (ms) L-R Lat (ms) L Amp (mV) R Amp (mV) L-R Amp (%) Site1 Site2 L Vel (m/s) R Vel (m/s) L-R Vel (m/s)  Median Motor (Abd Poll Brev)  29.2C  Wrist 3.6   7.3   Elbow Wrist 56    Elbow 6.9   7.4         Ulnar Motor (Abd Dig Min)  29.4C  Wrist 3.4   14.0   B Elbow Wrist 59    B  Elbow 6.3   12.9   A Elbow B Elbow 64  A Elbow 7.7   12.6            Waveforms:            Clinical History: 08/08/2019 EMG/NCS bilater UE  Impression: essentially NORMAL.  [name], MD [facility] Neurology   She reports that she quit smoking about 33 years ago. Her smoking use included cigarettes. She started smoking about 35 years ago. She has a 6 pack-year smoking history. She has never used smokeless tobacco. No results for input(s): HGBA1C, LABURIC in the last 8760 hours.  Objective:  VS:  HT:    WT:   BMI:     BP:   HR: bpm  TEMP: ( )  RESP:  Physical Exam Vitals and nursing note reviewed.  Constitutional:      General: She is not in acute distress.    Appearance: Normal appearance. She is well-developed. She is not ill-appearing.  HENT:     Head: Normocephalic and atraumatic.  Eyes:     Conjunctiva/sclera: Conjunctivae normal.     Pupils: Pupils are equal, round, and reactive to light.  Cardiovascular:     Rate and Rhythm: Normal rate.     Pulses: Normal pulses.  Pulmonary:     Effort: Pulmonary effort is normal.     Breath sounds: Rhonchi present.  Musculoskeletal:        General: Tenderness present. No swelling or deformity.     Right lower leg: No edema.     Left lower leg: No edema.     Comments: Inspection reveals no atrophy of the bilateral APB or FDI or hand intrinsics. There is no swelling, color changes, allodynia or dystrophic changes. There is 5 out of 5 strength in the bilateral wrist extension, finger abduction and long finger flexion. There is intact sensation to light touch in all dermatomal and peripheral nerve distributions. There is a negative Froment's test bilaterally. There is a negative Tinel's test at the bilateral wrist and elbow. There is a negative Phalen's test bilaterally. There is a negative Hoffmann's test bilaterally.  Skin:    General: Skin is warm and dry.     Findings: No erythema or rash.  Neurological:     General:  No focal deficit present.     Mental Status: She is alert and oriented to person, place, and time.     Cranial Nerves: No cranial nerve deficit.     Sensory: No sensory deficit.     Motor: No weakness or abnormal muscle tone.     Coordination: Coordination normal.     Gait: Gait normal.  Psychiatric:        Mood and Affect: Mood normal.        Behavior: Behavior normal.     Ortho Exam  Imaging: No results found.  Past Medical/Family/Surgical/Social History: Medications & Allergies reviewed per EMR, new medications updated. Patient Active Problem List   Diagnosis Date Noted   Elevated liver enzymes 04/27/2024   E coli bacteremia 04/11/2024   AKI (acute kidney injury) (HCC) 04/10/2024   Fever, unspecified 04/10/2024   Hypokalemia 04/10/2024   Hyponatremia 04/10/2024   Elevated transaminase level 04/10/2024   Hyperbilirubinemia 04/10/2024   Back pain 03/26/2024   High serum vitamin B12 03/26/2024   Numbness and tingling in left hand 03/26/2024   GAD (generalized anxiety disorder) 01/14/2024   Other spondylosis with radiculopathy, cervical region 12/13/2023   Impingement syndrome of left shoulder 12/13/2023   Breast pain, left 11/23/2023   Cough 09/12/2023  Immunization due 08/23/2023   Bloating 06/08/2023   Hot flashes due to menopause 04/26/2023   Great toe pain, unspecified laterality 04/21/2023   Positive self-administered antigen test for COVID-19 12/23/2022   Syncope and collapse 12/09/2022   Right knee pain 09/07/2021   Headache, variant migraine 07/07/2021   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/06/2014   H/O abnormal Pap smear 04/20/2013   Thyroid  nodule 08/16/2011   Right thigh pain 06/01/2011   Chronic constipation 06/01/2011   Low back pain with left-sided sciatica 12/29/2009   Headache(784.0) 09/26/2008   Hyperlipidemia with target LDL less than 100 01/29/2008   GLAUCOMA 01/29/2008   Hypertension 01/29/2008   GERD 01/29/2008   Past Medical History:  Diagnosis Date   Anxiety    Arthritis    Cancer (HCC) 1993   abnormal pap [facility]   Constipation    Depression    GERD (gastroesophageal reflux disease)    Glaucoma 1993   History of kidney stones    Hot flashes, menopausal 06/12/2011   Hyperlipidemia    Hypertension    Neck pain 05/2008   WITH BULGING DISC-----RECIEVING EPIDURALS    Pinched nerve    back   Sinusitis    Family History  Adopted: Yes  Problem Relation Age of Onset   Alcohol abuse Mother    Stomach cancer Mother 46   Lung cancer Father    Glaucoma Father    Alcohol abuse Father    Seizures Father    Breast cancer Sister 40   Breast cancer Sister 4   Heart disease Sister    Heart attack Sister    Glaucoma Son    Glaucoma Maternal Grandfather    Cancer Maternal Grandfather        poss leukemia   SIDS Brother    Hyperthyroidism Daughter    Seizures Son        as a Development worker, international aid   Kidney disease Maternal Grandmother    Heart disease Maternal Grandmother        Visual merchandiser   Leukemia Maternal Grandmother    Colon cancer Neg Hx    Past Surgical History:  Procedure Laterality Date   ABDOMINAL EXPLORATION SURGERY  age 14   bowel obstruction, [facility]   ABDOMINAL HYSTERECTOMY  2007   [facility], [facility]   BILATERAL EYE SURGERY     for glaucoma   BIOPSY  01/09/2019   Procedure: BIOPSY;  Surgeon: Harvey Margo CROME, MD;  Location: [facility];  Service: Endoscopy;;   gstric    bone removed from under tongue     BREAST BIOPSY Left    benign   BREAST CYST EXCISION Left 2004   BREAST SURGERY Left 2004   left partial mastectomy, [facility]   CATARACT EXTRACTION Right 06/04/2015   CATARACT EXTRACTION W/PHACO  05/16/2011   Procedure: CATARACT EXTRACTION PHACO AND INTRAOCULAR LENS PLACEMENT (IOC);  Surgeon: Cherene Mania;  Location: [facility] OR[facility];  Service: Ophthalmology;  Laterality:  Right;   COLONOSCOPY N/A 05/17/2013   Dr. Sharla diverticulosis was noted/small internal hemorrhoids   COLONOSCOPY WITH PROPOFOL  N/A 04/12/2021   Colonoscopy June 2022: small anal sphincter s/p rectal surgery, internal hemorrhoids, one 2 mm polyp in cecum (benign). 10 year surveillance.   CYST REMOVED LEFT BREAST /BENIGN  2005   left, [facility]   ESOPHAGOGASTRODUODENOSCOPY N/A 01/09/2019   normal esophagus. Mild NSAID gastritis s/p biopsy. +H.pylori. treated with amoxicillin , biaxin , Nexium .SABRA ERADICATION DOCUMENTED   EYE SURGERY  2010, 1992 approx   bilateral   POLYPECTOMY  04/12/2021   Procedure: POLYPECTOMY;  Surgeon: Cindie Carlin POUR, DO;  Location: [facility];  Service: Endoscopy;;   REPAIR IMPERFORATE ANUS / ANORECTOPLASTY     per patient   TOTAL ABDOMINAL HYSTERECTOMY W/ BILATERAL SALPINGOOPHORECTOMY  2007   [facility], [facility]   TUBAL LIGATION  1991   Social History   Occupational History   Occupation: UNEMPLOYED 2011    Comment: disability    Employer: DISABLED  Tobacco Use   Smoking status: Former    Current packs/day: 0.00    Average packs/day: 3.0 packs/day for 2.0 years (6.0 ttl pk-yrs)    Types: Cigarettes    Start date: 05/11/1989    Quit date: 05/12/1991    Years since quitting: 33.2   Smokeless tobacco: Never  Vaping Use   Vaping status: Never Used  Substance and Sexual Activity   Alcohol use: Not Currently   Drug use: Never   Sexual activity: Yes    Birth control/protection: Surgical

## 2024-07-10 NOTE — Procedures (Signed)
 EMG & NCV Findings: Evaluation of the left median (across palm) sensory nerve showed prolonged distal peak latency (Wrist, 4.0 ms) and prolonged distal peak latency (Palm, 2.2 ms).  All remaining nerves (as indicated in the following tables) were within normal limits.    All examined muscles (as indicated in the following table) showed no evidence of electrical instability.    Impression: The above electrodiagnostic study is ABNORMAL and reveals evidence of a mild left median nerve entrapment at the wrist (carpal tunnel syndrome) affecting sensory components.   There is no significant electrodiagnostic evidence of any other focal nerve entrapment, brachial plexopathy or cervical radiculopathy.   Recommendations: 1.  Follow-up with referring physician. 2.  Continue current management of symptoms. 3.  Continue use of resting splint at night-time and as needed during the day.  ___________________________ Prentice Eldonna BETTERS Board Certified, American Board of Physical Medicine and Rehabilitation    Nerve Conduction Studies Anti Sensory Summary Table   Stim Site NR Peak (ms) Norm Peak (ms) P-T Amp (V) Norm P-T Amp Site1 Site2 Delta-P (ms) Dist (cm) Vel (m/s) Norm Vel (m/s)  Left Median Acr Palm Anti Sensory (2nd Digit)  28.6C  Wrist    *4.0 <3.6 65.2 >10 Wrist Palm 1.8 0.0    Palm    *2.2 <2.0 79.5         Left Radial Anti Sensory (Base 1st Digit)  28.6C  Wrist    2.3 <3.1 38.8  Wrist Base 1st Digit 2.3 0.0    Left Ulnar Anti Sensory (5th Digit)  29.1C  Wrist    3.7 <3.7 46.6 >15.0 Wrist 5th Digit 3.7 14.0 38 >38   Motor Summary Table   Stim Site NR Onset (ms) Norm Onset (ms) O-P Amp (mV) Norm O-P Amp Site1 Site2 Delta-0 (ms) Dist (cm) Vel (m/s) Norm Vel (m/s)  Left Median Motor (Abd Poll Brev)  29.2C  Wrist    3.6 <4.2 7.3 >5 Elbow Wrist 3.3 18.5 56 >50  Elbow    6.9  7.4         Left Ulnar Motor (Abd Dig Min)  29.4C  Wrist    3.4 <4.2 14.0 >3 B Elbow Wrist 2.9 17.0 59 >53   B Elbow    6.3  12.9  A Elbow B Elbow 1.4 9.0 64 >53  A Elbow    7.7  12.6          EMG   Side Muscle Nerve Root Ins Act Fibs Psw Amp Dur Poly Recrt Int Bruna Comment  Left Abd Poll Brev Median C8-T1 Nml Nml Nml Nml Nml 0 Nml Nml   Left 1stDorInt Ulnar C8-T1 Nml Nml Nml Nml Nml 0 Nml Nml   Left PronatorTeres Median C6-7 Nml Nml Nml Nml Nml 0 Nml Nml   Left Biceps Musculocut C5-6 Nml Nml Nml Nml Nml 0 Nml Nml   Left Deltoid Axillary C5-6 Nml Nml Nml Nml Nml 0 Nml Nml     Nerve Conduction Studies Anti Sensory Left/Right Comparison   Stim Site L Lat (ms) R Lat (ms) L-R Lat (ms) L Amp (V) R Amp (V) L-R Amp (%) Site1 Site2 L Vel (m/s) R Vel (m/s) L-R Vel (m/s)  Median Acr Palm Anti Sensory (2nd Digit)  28.6C  Wrist *4.0   65.2   Wrist Palm     Palm *2.2   79.5         Radial Anti Sensory (Base 1st Digit)  28.6C  Wrist 2.3  38.8   Wrist Base 1st Digit     Ulnar Anti Sensory (5th Digit)  29.1C  Wrist 3.7   46.6   Wrist 5th Digit 38     Motor Left/Right Comparison   Stim Site L Lat (ms) R Lat (ms) L-R Lat (ms) L Amp (mV) R Amp (mV) L-R Amp (%) Site1 Site2 L Vel (m/s) R Vel (m/s) L-R Vel (m/s)  Median Motor (Abd Poll Brev)  29.2C  Wrist 3.6   7.3   Elbow Wrist 56    Elbow 6.9   7.4         Ulnar Motor (Abd Dig Min)  29.4C  Wrist 3.4   14.0   B Elbow Wrist 59    B Elbow 6.3   12.9   A Elbow B Elbow 64    A Elbow 7.7   12.6            Waveforms:

## 2024-07-15 ENCOUNTER — Ambulatory Visit (HOSPITAL_COMMUNITY): Attending: Surgical | Admitting: Occupational Therapy

## 2024-07-15 ENCOUNTER — Encounter (HOSPITAL_COMMUNITY): Payer: Self-pay | Admitting: Occupational Therapy

## 2024-07-15 ENCOUNTER — Encounter: Payer: Self-pay | Admitting: Physical Medicine and Rehabilitation

## 2024-07-15 DIAGNOSIS — S52502A Unspecified fracture of the lower end of left radius, initial encounter for closed fracture: Secondary | ICD-10-CM | POA: Insufficient documentation

## 2024-07-15 DIAGNOSIS — M25632 Stiffness of left wrist, not elsewhere classified: Secondary | ICD-10-CM | POA: Diagnosis not present

## 2024-07-15 DIAGNOSIS — R29898 Other symptoms and signs involving the musculoskeletal system: Secondary | ICD-10-CM | POA: Diagnosis not present

## 2024-07-15 DIAGNOSIS — M25532 Pain in left wrist: Secondary | ICD-10-CM | POA: Diagnosis not present

## 2024-07-15 NOTE — Patient Instructions (Signed)
AROM Exercises   1) Wrist Flexion  Start with wrist at edge of table, palm facing up. With wrist hanging slightly off table, curl wrist upward, and back down.      2) Wrist Extension  Start with wrist at edge of table, palm facing down. With wrist slightly off the edge of the table, curl wrist up and back down.      3) Radial Deviations  Start with forearm flat against a table, wrist hanging slightly off the edge, and palm facing the wall. Bending at the wrist only, and keeping palm facing the wall, bend wrist so fist is pointing towards the floor, back up to start position, and up towards the ceiling. Return to start.        4) WRIST PRONATION  Turn your forearm towards palm face down.  Keep your elbow bent and by the side of your  Body.      5) WRIST SUPINATION  Turn your forearm towards palm face up.  Keep your elbow bent and by the side of your  Body.      *Complete exercises ______ times each, _______ times per day*   

## 2024-07-15 NOTE — Therapy (Unsigned)
 OUTPATIENT OCCUPATIONAL THERAPY ORTHO EVALUATION  Patient Name: Toni Miller MRN: [id number] DOB:05/21/1961, 63 y.o., female Today's Date: 07/16/2024  PCP: Antonetta Quant, MD REFERRING PROVIDER: Shirly Dunnings, PA-C  END OF SESSION:  OT End of Session - 07/16/24 1033     Visit Number 1    Number of Visits 8    Date for OT Re-Evaluation 08/16/24    Authorization Type [employer] Dual Complete    OT Start Time 1310    OT Stop Time 1342    OT Time Calculation (min) 32 min    Activity Tolerance Patient tolerated treatment well    Behavior During Therapy WFL for tasks assessed/performed          Past Medical History:  Diagnosis Date   Anxiety    Arthritis    Cancer (HCC) 1993   abnormal pap treated at [facility]   Constipation    Depression    GERD (gastroesophageal reflux disease)    Glaucoma 1993   History of kidney stones    Hot flashes, menopausal 06/12/2011   Hyperlipidemia    Hypertension    Neck pain 05/2008   WITH BULGING DISC-----RECIEVING EPIDURALS    Pinched nerve    back   Sinusitis    Past Surgical History:  Procedure Laterality Date   ABDOMINAL EXPLORATION SURGERY  age 24   bowel obstruction, [facility]   ABDOMINAL HYSTERECTOMY  2007   [facility], [facility]   BILATERAL EYE SURGERY     for glaucoma   BIOPSY  01/09/2019   Procedure: BIOPSY;  Surgeon: Harvey Margo CROME, MD;  Location: [facility];  Service: Endoscopy;;  gstric    bone removed from under tongue     BREAST BIOPSY Left    benign   BREAST CYST EXCISION Left 2004   BREAST SURGERY Left 2004   left partial mastectomy, [facility]   CATARACT EXTRACTION Right 06/04/2015   CATARACT EXTRACTION W/PHACO  05/16/2011   Procedure: CATARACT EXTRACTION PHACO AND INTRAOCULAR LENS PLACEMENT (IOC);  Surgeon: Cherene Mania;  Location: [facility];  Service: Ophthalmology;  Laterality: Right;   COLONOSCOPY N/A 05/17/2013   Dr. Sharla diverticulosis was noted/small internal hemorrhoids   COLONOSCOPY WITH PROPOFOL  N/A 04/12/2021    Colonoscopy June 2022: small anal sphincter s/p rectal surgery, internal hemorrhoids, one 2 mm polyp in cecum (benign). 10 year surveillance.   CYST REMOVED LEFT BREAST /BENIGN  2005   left, [facility]   ESOPHAGOGASTRODUODENOSCOPY N/A 01/09/2019   normal esophagus. Mild NSAID gastritis s/p biopsy. +H.pylori. treated with amoxicillin , biaxin , Nexium .SABRA ERADICATION DOCUMENTED   EYE SURGERY  2010, 1992 approx   bilateral   POLYPECTOMY  04/12/2021   Procedure: POLYPECTOMY;  Surgeon: Cindie Dunnings POUR, DO;  Location: [facility];  Service: Endoscopy;;   REPAIR IMPERFORATE ANUS / ANORECTOPLASTY     per patient   TOTAL ABDOMINAL HYSTERECTOMY W/ BILATERAL SALPINGOOPHORECTOMY  2007   [facility], [facility]   TUBAL LIGATION  1991   Patient Active Problem List   Diagnosis Date Noted   Elevated liver enzymes 04/27/2024   E coli bacteremia 04/11/2024   AKI (acute kidney injury) (HCC) 04/10/2024   Fever, unspecified 04/10/2024   Hypokalemia 04/10/2024   Hyponatremia 04/10/2024   Elevated transaminase level 04/10/2024   Hyperbilirubinemia 04/10/2024   Back pain 03/26/2024   High serum vitamin B12 03/26/2024   Numbness and tingling in left hand 03/26/2024   GAD (generalized anxiety disorder) 01/14/2024   Other spondylosis with radiculopathy, cervical region 12/13/2023   Impingement syndrome of  left shoulder 12/13/2023   Breast pain, left 11/23/2023   Cough 09/12/2023   Immunization due 08/23/2023   Bloating 06/08/2023   Hot flashes due to menopause 04/26/2023   Great toe pain, unspecified laterality 04/21/2023   Positive self-administered antigen test for COVID-19 12/23/2022   Syncope and collapse 12/09/2022   Right knee pain 09/07/2021   Headache, variant migraine 07/07/2021   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/24/2020   Menopausal vaginal dryness 10/09/2019   Knee pain, left 04/26/2019   Left hip pain 04/26/2019   Dyspepsia 12/12/2018   FH: breast cancer in first degree relative when <76 years old 06/06/2018   Neck pain 04/21/2016   Arthritis of both knees 04/25/2015   Osteopenia 04/02/2014   Depression, major, recurrent, in partial remission (HCC) 01/06/2014   H/O abnormal Pap smear 04/20/2013   Thyroid  nodule 08/16/2011   Right thigh pain 06/01/2011   Chronic constipation 06/01/2011   Low back pain with left-sided sciatica 12/29/2009   Headache(784.0) 09/26/2008   Hyperlipidemia with target LDL less than 100 01/29/2008   GLAUCOMA 01/29/2008   Hypertension 01/29/2008   GERD 01/29/2008    ONSET DATE: 04/2024 - fall  REFERRING DIAG:  M25.532 (ICD-10-CM) - Pain in left wrist  S52.502A (ICD-10-CM) - Closed fracture of distal end of left radius, unspecified fracture morphology, initial encounter    THERAPY DIAG:  Pain in left wrist  Stiffness of left wrist, not elsewhere classified  Other symptoms and signs involving the musculoskeletal system  Rationale for Evaluation and Treatment: Rehabilitation  SUBJECTIVE:   SUBJECTIVE STATEMENT: I'm having a lot of tingling Pt accompanied by: self  PERTINENT HISTORY: Pt has chronic L wrist pain, x-rays were negative, however MRI indicated that she has a L distal radius fracture.   PRECAUTIONS: None  WEIGHT BEARING RESTRICTIONS: Yes >2lbs  PAIN:  Are you having pain? Yes: NPRS scale: 6/10 Pain location: dorsal/ulnar side of wrist Pain description: aching and tingling Aggravating factors: movement Relieving factors: Nothing  FALLS: Has patient fallen in last 6 months? Yes. Number of falls 1  PLOF: Independent  PATIENT GOALS: To improve pain and tingling  NEXT MD VISIT: 07/24/24  OBJECTIVE:  Note: Objective measures were completed at Evaluation unless otherwise noted.  HAND DOMINANCE:  Right  ADLs: Overall ADLs: Pt unable to fasten her bra or tie her shoe laces. Also due to pain and restrictions she is unable to lift pots or pans for cooking or cleaning items.   FUNCTIONAL OUTCOME MEASURES: Quick Dash: 68.18  UPPER EXTREMITY ROM:     Active ROM Left eval  Wrist flexion 43  Wrist extension 33  Wrist ulnar deviation 18  Wrist radial deviation 2  Wrist pronation WFL  Wrist supination WFL  (Blank rows = not tested)  UPPER EXTREMITY MMT:     MMT Left eval  Wrist flexion   Wrist extension   Wrist ulnar deviation   Wrist radial deviation   Wrist pronation   Wrist supination   (Blank rows = not tested)  HAND FUNCTION: Grip strength: Right: 52 lbs; Left: 19 lbs, Lateral pinch: Right: 12 lbs, Left: 7 lbs, and 3 point pinch: Right: 9 lbs, Left: 4 lbs  COORDINATION: 9 Hole Peg test: Right: 32.22 sec; Left: 42.07 sec  SENSATION: Light touch: Impaired  Proprioception: Impaired   EDEMA:  L forearm is swollen, as well as distal aspect right above the wrist.   OBSERVATIONS: moderate fascial restrictions along the forearm and palmar region of hand.    TREATMENT DATE:   07/15/24 -Wrist ROM: Flexion, extension, ulnar/radial deviation, supination/pronation, x10                                                                                                                             PATIENT EDUCATION: Education details: Wrist ROM Person educated: Patient Education method: Explanation, Demonstration, and Handouts Education comprehension: verbalized understanding and returned demonstration  HOME EXERCISE PROGRAM: 9/8: Wrist ROM  GOALS: Goals reviewed with patient? Yes  SHORT TERM GOALS: Target date: 08/23/24  Pt will be educated and provided a LUE HEP for L wrist mobility and ADL completion.   Goal status: INITIAL  2.  Pt will decrease LUE pain to 2/10 or less in order to sleep for 3+ consecutive hours.   Goal status: INITIAL  3.  Pt will decrease  LUE fascial restrictions to minimal/trace amounts in order to decrease pain and improve ROM.  Goal status: INITIAL  4.  Pt will increase LUE ROM by 10* or more in order to reach and manipulate items during dressing and bathing.   Goal status: INITIAL  5.  Pt will increase LUE strength to 4+/5 in order to hold and carry objects during cooking and cleaning tasks.   Goal status: INITIAL  6.  Pt will improve LUE coordination to completing 9 hole peg test in 33 seconds or less in order to manipulate buttons, zippers, and clasps independently.   Goal status: INITIAL   ASSESSMENT:  CLINICAL IMPRESSION: Patient is a 63 y.o. female who was seen today for occupational therapy evaluation for L distal radius fracture. She presents with increased pain, fascial restrictions, decreased ROM and strength, as well as poor fine motor skills.    PERFORMANCE DEFICITS: in functional skills including ADLs, IADLs, coordination, edema, ROM, strength, pain, fascial restrictions, Fine motor control, body mechanics, and UE functional use.   IMPAIRMENTS: are limiting patient from ADLs, IADLs, rest and sleep, work, leisure, and social participation.   COMORBIDITIES: has no other co-morbidities that affects occupational performance. Patient will benefit from skilled OT to address above impairments and improve overall function.  MODIFICATION OR ASSISTANCE TO COMPLETE EVALUATION: No modification of tasks or assist necessary to complete an evaluation.  OT OCCUPATIONAL PROFILE AND HISTORY: Problem focused assessment: Including review of records relating to presenting problem.  CLINICAL DECISION MAKING: LOW - limited treatment options, no task modification necessary  REHAB POTENTIAL: Good  EVALUATION COMPLEXITY: Low      PLAN:  OT FREQUENCY: 2x/week  OT DURATION: 4 weeks  PLANNED INTERVENTIONS: 97168 OT Re-evaluation, 97535 self care/ADL training, 02889 therapeutic exercise, 97530 therapeutic activity,  97112 neuromuscular re-education, 97140 manual therapy, 97035 ultrasound, 97018 paraffin, 02989 moist heat, 97032 electrical stimulation (manual), passive range of motion, functional mobility training, energy conservation, coping strategies training, patient/family  education, and DME and/or AE instructions  RECOMMENDED OTHER SERVICES: N/A  CONSULTED AND AGREED WITH PLAN OF CARE: Patient  PLAN FOR NEXT SESSION: Manual Therapy, ROM, stretching, fine motor skills   Valentin Nightingale, OTR/L [facility] [phone] Valentin Jillyn Nightingale, OT 07/16/2024, 10:47 AM

## 2024-07-19 ENCOUNTER — Ambulatory Visit (HOSPITAL_COMMUNITY): Admitting: Psychiatry

## 2024-07-19 ENCOUNTER — Encounter (HOSPITAL_COMMUNITY): Payer: Self-pay

## 2024-07-19 DIAGNOSIS — F411 Generalized anxiety disorder: Secondary | ICD-10-CM

## 2024-07-19 NOTE — Progress Notes (Signed)
 Virtual Visit via Telephone Note  I connected with [name] on 07/19/24 at 10:10 AM by telephone and verified that I am speaking with the correct person using two identifiers.  Location: Patient: Home Provider: Home Office   I discussed the limitations, risks, security and privacy concerns of performing an evaluation and management service by telephone and the availability of in person appointments. I also discussed with the patient that there may be a patient responsible charge related to this service. The patient expressed understanding and agreed to proceed.   I provided 48 minutes of non-face-to-face time during this encounter.   Winton FORBES Rubinstein, LCSW    THERAPIST PROGRESS NOTE  Session Time: Friday  Friday 07/19/2024  10:10 AM -  10:58 AM  Participation Level: Active  Behavioral Response: AlertAnxious  Type of Therapy: Individual Therapy  Treatment Goals addressed:  Decrease worry episodes to 1 x per week Decrease worry episodes to 1 x per week  Learn and implement relaxation techniques, practice a technique daily        ProgressTowards Goals:  Initial  Interventions: Supportive/CBT  Summary: GORGEOUS NEWLUN is a 63 y.o. female who   is referred for services by Redell Corn, LCSW.   She is a returning patient to this clinician as she was seen about 5 years ago. . She denies any psychiatric hospitalizations.  Patient reports worrying a lot beginning about 4 to 5 months ago due to experiencing bowel issues.  It per her report, issues are better now but memories of BMP done in childhood due to bowel issues have been triggered.  She reports additional stress related to concerns about daughter who has thyroid  issues and her son who has high blood pressure.  Patient worries about how to support and help her children.  Patient reports fatigue, irritability, and sleep difficulty.  She reports additional worries related to her living situation as her apartment needs repair but  landlord has not made repairs despite patient's repeated requests per her report.  Patient last was seen about 2 weeks ago.  Patient reports decreased stress and anxiety since last session.  Per patient's report, bowel issues and frequency of accidents have decreased to about 1 time per week.  Patient reports being less ashamed and decreased avoidance of involvement with others.  She has been using coping statements as well as nurturing self.  She has been relying on her spirituality.  She also reports using deep breathing and says this is helpful.  Patient also has tried to increase involvement in physical activity and reported recently going walking with her children.  Suicidal/Homicidal: No      Therapist Response: Developed treatment plan, sent signature page and treatment plan to patient via MyChart, reviewed symptoms, discussed stressors, facilitated expression of thoughts and feelings, validated feelings, praised and reinforced patient's efforts to use healthy coping strategies discussed last session, discussed effects of the use on her mood/thoughts/behavior, developed a plan with patient to continue using strategies consistently, discussed rationale for and assisted patient practice a beach visualization as a relaxation technique, developed plan with patient to practice daily, checked out interactive audio activity to patient and provided with access code to assist her in her efforts   Diagnosis: Generalized anxiety disorder  Collaboration of Care: Primary Care Provider AEB patient sees PCP Dr. Rollene Pesa for medication management  Patient/Guardian was advised Release of Information must be obtained prior to any record release in order to collaborate their care with an outside provider. Patient/Guardian  was advised if they have not already done so to contact the registration department to sign all necessary forms in order for us  to release information regarding their care.   Consent:  Patient/Guardian gives verbal consent for treatment and assignment of benefits for services provided during this visit. Patient/Guardian expressed understanding and agreed to proceed.   Winton FORBES Rubinstein, LCSW 07/19/2024

## 2024-07-22 ENCOUNTER — Encounter (HOSPITAL_COMMUNITY): Payer: Self-pay | Admitting: Occupational Therapy

## 2024-07-22 ENCOUNTER — Ambulatory Visit (HOSPITAL_COMMUNITY): Admitting: Occupational Therapy

## 2024-07-22 DIAGNOSIS — R29898 Other symptoms and signs involving the musculoskeletal system: Secondary | ICD-10-CM

## 2024-07-22 DIAGNOSIS — M25632 Stiffness of left wrist, not elsewhere classified: Secondary | ICD-10-CM | POA: Diagnosis not present

## 2024-07-22 DIAGNOSIS — M25532 Pain in left wrist: Secondary | ICD-10-CM | POA: Diagnosis not present

## 2024-07-22 DIAGNOSIS — S52502A Unspecified fracture of the lower end of left radius, initial encounter for closed fracture: Secondary | ICD-10-CM | POA: Diagnosis not present

## 2024-07-22 NOTE — Patient Instructions (Signed)

## 2024-07-22 NOTE — Therapy (Signed)
 OUTPATIENT OCCUPATIONAL THERAPY ORTHO TREATMENT NOTE  Patient Name: Toni Miller MRN: [id number] DOB:May 15, 1961, 63 y.o., female Today's Date: 07/22/2024  PCP: Antonetta Quant, MD REFERRING PROVIDER: Shirly Dunnings, PA-C  END OF SESSION:  OT End of Session - 07/22/24 1413     Visit Number 2    Number of Visits 8    Date for OT Re-Evaluation 08/16/24    Authorization Type [employer] Dual Complete    OT Start Time 1312    OT Stop Time 1353    OT Time Calculation (min) 41 min    Activity Tolerance Patient tolerated treatment well    Behavior During Therapy WFL for tasks assessed/performed           Past Medical History:  Diagnosis Date   Anxiety    Arthritis    Cancer (HCC) 1993   abnormal pap treated at [facility]   Constipation    Depression    GERD (gastroesophageal reflux disease)    Glaucoma 1993   History of kidney stones    Hot flashes, menopausal 06/12/2011   Hyperlipidemia    Hypertension    Neck pain 05/2008   WITH BULGING DISC-----RECIEVING EPIDURALS    Pinched nerve    back   Sinusitis    Past Surgical History:  Procedure Laterality Date   ABDOMINAL EXPLORATION SURGERY  age 45   bowel obstruction, [facility]   ABDOMINAL HYSTERECTOMY  2007   [facility], [facility]   BILATERAL EYE SURGERY     for glaucoma   BIOPSY  01/09/2019   Procedure: BIOPSY;  Surgeon: Harvey Margo CROME, MD;  Location: [facility];  Service: Endoscopy;;  gstric    bone removed from under tongue     BREAST BIOPSY Left    benign   BREAST CYST EXCISION Left 2004   BREAST SURGERY Left 2004   left partial mastectomy, [facility]   CATARACT EXTRACTION Right 06/04/2015   CATARACT EXTRACTION W/PHACO  05/16/2011   Procedure: CATARACT EXTRACTION PHACO AND INTRAOCULAR LENS PLACEMENT (IOC);  Surgeon: Cherene Mania;  Location: [facility];  Service: Ophthalmology;  Laterality: Right;   COLONOSCOPY N/A 05/17/2013   Dr. Sharla diverticulosis was noted/small internal hemorrhoids   COLONOSCOPY WITH PROPOFOL  N/A 04/12/2021    Colonoscopy June 2022: small anal sphincter s/p rectal surgery, internal hemorrhoids, one 2 mm polyp in cecum (benign). 10 year surveillance.   CYST REMOVED LEFT BREAST /BENIGN  2005   left, [facility]   ESOPHAGOGASTRODUODENOSCOPY N/A 01/09/2019   normal esophagus. Mild NSAID gastritis s/p biopsy. +H.pylori. treated with amoxicillin , biaxin , Nexium .. ERADICATION DOCUMENTED   EYE SURGERY  2010, 1992 approx   bilateral   POLYPECTOMY  04/12/2021   Procedure: POLYPECTOMY;  Surgeon: Cindie Dunnings POUR, DO;  Location: [facility];  Service: Endoscopy;;   REPAIR IMPERFORATE ANUS / ANORECTOPLASTY     per patient   TOTAL ABDOMINAL HYSTERECTOMY W/ BILATERAL SALPINGOOPHORECTOMY  2007   [facility], [facility]   TUBAL LIGATION  1991   Patient Active Problem List   Diagnosis Date Noted   Elevated liver enzymes 04/27/2024   E coli bacteremia 04/11/2024   AKI (acute kidney injury) (HCC) 04/10/2024   Fever, unspecified 04/10/2024   Hypokalemia 04/10/2024   Hyponatremia 04/10/2024   Elevated transaminase level 04/10/2024   Hyperbilirubinemia 04/10/2024   Back pain 03/26/2024   High serum vitamin B12 03/26/2024   Numbness and tingling in left hand 03/26/2024   GAD (generalized anxiety disorder) 01/14/2024   Other spondylosis with radiculopathy, cervical region 12/13/2023   Impingement  syndrome of left shoulder 12/13/2023   Breast pain, left 11/23/2023   Cough 09/12/2023   Immunization due 08/23/2023   Bloating 06/08/2023   Hot flashes due to menopause 04/26/2023   Great toe pain, unspecified laterality 04/21/2023   Positive self-administered antigen test for COVID-19 12/23/2022   Syncope and collapse 12/09/2022   Right knee pain 09/07/2021   Headache, variant migraine 07/07/2021   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/24/2020   Menopausal vaginal dryness 10/09/2019   Knee pain, left 04/26/2019   Left hip pain 04/26/2019   Dyspepsia 12/12/2018   FH: breast cancer in first degree relative when <41 years old 06/06/2018   Neck pain 04/21/2016   Arthritis of both knees 04/25/2015   Osteopenia 04/02/2014   Depression, major, recurrent, in partial remission (HCC) 01/06/2014   H/O abnormal Pap smear 04/20/2013   Thyroid  nodule 08/16/2011   Right thigh pain 06/01/2011   Chronic constipation 06/01/2011   Low back pain with left-sided sciatica 12/29/2009   Headache(784.0) 09/26/2008   Hyperlipidemia with target LDL less than 100 01/29/2008   GLAUCOMA 01/29/2008   Hypertension 01/29/2008   GERD 01/29/2008    ONSET DATE: 04/2024 - fall  REFERRING DIAG:  M25.532 (ICD-10-CM) - Pain in left wrist  S52.502A (ICD-10-CM) - Closed fracture of distal end of left radius, unspecified fracture morphology, initial encounter    THERAPY DIAG:  Pain in left wrist  Stiffness of left wrist, not elsewhere classified  Other symptoms and signs involving the musculoskeletal system  Rationale for Evaluation and Treatment: Rehabilitation  SUBJECTIVE:   SUBJECTIVE STATEMENT: The numbness and tingling are really bothering me Pt accompanied by: self  PERTINENT HISTORY: Pt has chronic L wrist pain, x-rays were negative, however MRI indicated that she has a L distal radius fracture.   PRECAUTIONS: None  WEIGHT BEARING RESTRICTIONS: Yes >2lbs  PAIN:  Are you having pain? Yes: NPRS scale: 8/10 Pain location: dorsal/ulnar side of wrist Pain description: aching and tingling Aggravating factors: movement Relieving factors: Nothing  FALLS: Has patient fallen in last 6 months? Yes. Number of falls 1  PLOF: Independent  PATIENT GOALS: To improve pain and tingling  NEXT MD VISIT: 07/24/24  OBJECTIVE:  Note: Objective measures were completed at Evaluation unless otherwise noted.  HAND  DOMINANCE: Right  ADLs: Overall ADLs: Pt unable to fasten her bra or tie her shoe laces. Also due to pain and restrictions she is unable to lift pots or pans for cooking or cleaning items.   FUNCTIONAL OUTCOME MEASURES: Quick Dash: 68.18  UPPER EXTREMITY ROM:     Active ROM Left eval  Wrist flexion 43  Wrist extension 33  Wrist ulnar deviation 18  Wrist radial deviation 2  Wrist pronation WFL  Wrist supination WFL  (Blank rows = not tested)  UPPER EXTREMITY MMT:     MMT Left eval  Wrist flexion   Wrist extension   Wrist ulnar deviation   Wrist radial deviation   Wrist pronation   Wrist supination   (Blank rows = not tested)  HAND FUNCTION: Grip strength: Right: 52 lbs; Left: 19 lbs, Lateral pinch: Right: 12 lbs, Left: 7 lbs, and 3 point pinch: Right: 9 lbs, Left: 4 lbs  COORDINATION: 9 Hole Peg test: Right: 32.22 sec; Left: 42.07 sec  SENSATION: Light touch: Impaired  Proprioception:  Impaired   EDEMA: L forearm is swollen, as well as distal aspect right above the wrist.   OBSERVATIONS: moderate fascial restrictions along the forearm and palmar region of hand.    TREATMENT DATE:   07/22/24 -Manual Therapy: myofascial release applied to forearm and palm in order to reduce fascial restrictions and pain, as well as improve ROM.  -Digit ROM: composite flexion, abduction, opposition, finger taps, x10 -Towel Crumple, x5 -Wrist ROM: Flexion, extension, ulnar/radial deviation, supination/pronation, x10  -Sponges: 8, 10, 12  07/15/24 -Wrist ROM: Flexion, extension, ulnar/radial deviation, supination/pronation, x10                                                                                                                             PATIENT EDUCATION: Education details: Digit ROM Person educated: Patient Education method: Explanation, Demonstration, and Handouts Education comprehension: verbalized understanding and returned demonstration  HOME EXERCISE  PROGRAM: 9/8: Wrist ROM 9/15: Digit ROM  GOALS: Goals reviewed with patient? Yes  SHORT TERM GOALS: Target date: 08/23/24  Pt will be educated and provided a LUE HEP for L wrist mobility and ADL completion.   Goal status: IN PROGRESS  2.  Pt will decrease LUE pain to 2/10 or less in order to sleep for 3+ consecutive hours.   Goal status: IN PROGRESS  3.  Pt will decrease LUE fascial restrictions to minimal/trace amounts in order to decrease pain and improve ROM.  Goal status: IN PROGRESS  4.  Pt will increase LUE ROM by 10* or more in order to reach and manipulate items during dressing and bathing.   Goal status: IN PROGRESS  5.  Pt will increase LUE strength to 4+/5 in order to hold and carry objects during cooking and cleaning tasks.   Goal status: IN PROGRESS  6.  Pt will improve LUE coordination to completing 9 hole peg test in 33 seconds or less in order to manipulate buttons, zippers, and clasps independently.   Goal status: IN PROGRESS   ASSESSMENT:  CLINICAL IMPRESSION: This session pt continuing to have severe pain in the dorsal aspect of her hand and ulnar aspect of her wrist. She is demonstrating good ROM however experiencing pain and increased tingling with increased movements throughout the session. OT providing verbal and tactile cuing for positioning and technique throughout the session.   PERFORMANCE DEFICITS: in functional skills including ADLs, IADLs, coordination, edema, ROM, strength, pain, fascial restrictions, Fine motor control, body mechanics, and UE functional use.    PLAN:  OT FREQUENCY: 2x/week  OT DURATION: 4 weeks  PLANNED INTERVENTIONS: 97168 OT Re-evaluation, 97535 self care/ADL training, 02889 therapeutic exercise, 97530 therapeutic activity, 97112 neuromuscular re-education, 97140 manual therapy, 97035 ultrasound, 97018 paraffin, 02989 moist heat, 97032 electrical stimulation (manual), passive range of motion, functional mobility  training, energy conservation, coping strategies training, patient/family education, and DME and/or AE instructions  RECOMMENDED OTHER SERVICES: N/A  CONSULTED AND AGREED WITH PLAN OF CARE: Patient  PLAN FOR NEXT  SESSION: Manual Therapy, ROM, stretching, fine motor skills   Valentin Nightingale, OTR/L [facility] [phone] Valentin Jillyn Nightingale, OT 07/22/2024, 2:14 PM

## 2024-07-23 ENCOUNTER — Encounter (HOSPITAL_COMMUNITY): Payer: Self-pay | Admitting: Emergency Medicine

## 2024-07-23 ENCOUNTER — Emergency Department (HOSPITAL_COMMUNITY)
Admission: EM | Admit: 2024-07-23 | Discharge: 2024-07-23 | Disposition: A | Discharge status: Home / Self Care | Attending: Emergency Medicine | Admitting: Emergency Medicine

## 2024-07-23 ENCOUNTER — Other Ambulatory Visit: Payer: Self-pay

## 2024-07-23 DIAGNOSIS — Z859 Personal history of malignant neoplasm, unspecified: Secondary | ICD-10-CM | POA: Insufficient documentation

## 2024-07-23 DIAGNOSIS — I10 Essential (primary) hypertension: Secondary | ICD-10-CM | POA: Insufficient documentation

## 2024-07-23 DIAGNOSIS — R519 Headache, unspecified: Secondary | ICD-10-CM | POA: Diagnosis not present

## 2024-07-23 LAB — CBC WITH DIFFERENTIAL/PLATELET
Abs Immature Granulocytes: 0.01 K/uL (ref 0.00–0.07)
Basophils Absolute: 0 K/uL (ref 0.0–0.1)
Basophils Relative: 1 %
Eosinophils Absolute: 0.1 K/uL (ref 0.0–0.5)
Eosinophils Relative: 2 %
HCT: 42.3 % (ref 36.0–46.0)
Hemoglobin: 14.4 g/dL (ref 12.0–15.0)
Immature Granulocytes: 0 %
Lymphocytes Relative: 41 %
Lymphs Abs: 2.6 K/uL (ref 0.7–4.0)
MCH: 29.8 pg (ref 26.0–34.0)
MCHC: 34 g/dL (ref 30.0–36.0)
MCV: 87.6 fL (ref 80.0–100.0)
Monocytes Absolute: 0.5 K/uL (ref 0.1–1.0)
Monocytes Relative: 8 %
Neutro Abs: 3.1 K/uL (ref 1.7–7.7)
Neutrophils Relative %: 48 %
Platelets: 268 K/uL (ref 150–400)
RBC: 4.83 MIL/uL (ref 3.87–5.11)
RDW: 12.6 % (ref 11.5–15.5)
WBC: 6.3 K/uL (ref 4.0–10.5)
nRBC: 0 % (ref 0.0–0.2)

## 2024-07-23 LAB — COMPREHENSIVE METABOLIC PANEL WITH GFR
ALT: 24 U/L (ref 0–44)
AST: 19 U/L (ref 15–41)
Albumin: 4.5 g/dL (ref 3.5–5.0)
Alkaline Phosphatase: 62 U/L (ref 38–126)
Anion gap: 9 (ref 5–15)
BUN: 14 mg/dL (ref 8–23)
CO2: 28 mmol/L (ref 22–32)
Calcium: 9.6 mg/dL (ref 8.9–10.3)
Chloride: 101 mmol/L (ref 98–111)
Creatinine, Ser: 0.85 mg/dL (ref 0.44–1.00)
GFR, Estimated: >60 mL/min (ref >60)
Glucose, Bld: 88 mg/dL (ref 70–99)
Potassium: 4 mmol/L (ref 3.5–5.1)
Sodium: 138 mmol/L (ref 135–145)
Total Bilirubin: 0.8 mg/dL (ref 0.0–1.2)
Total Protein: 8 g/dL (ref 6.5–8.1)

## 2024-07-23 MED ORDER — KETOROLAC TROMETHAMINE 15 MG/ML IJ SOLN
15.0000 mg | Freq: Once | INTRAMUSCULAR | Status: AC
  Administered 2024-07-23: 15 mg via INTRAVENOUS
  Filled 2024-07-23: qty 1

## 2024-07-23 MED ORDER — PROCHLORPERAZINE EDISYLATE 10 MG/2ML IJ SOLN
5.0000 mg | Freq: Once | INTRAMUSCULAR | Status: AC
  Administered 2024-07-23: 5 mg via INTRAVENOUS
  Filled 2024-07-23: qty 2

## 2024-07-23 NOTE — ED Triage Notes (Signed)
 Pt to the ED with complaints of a headache that began this morning. She states she has taken tylenol  2x with no relief.  Pt denies falling and hitting head.

## 2024-07-23 NOTE — ED Provider Notes (Signed)
 [place] [facility] DEPARTMENT AT [facility] Provider Note   CSN: [id number] Arrival date & time: 07/23/24  1410     Patient presents with: Headache   Toni Miller is a 63 y.o. female.    Headache Presents to headache.  Morning.  Left occipital area.  Similar previous headaches.  May have had some chills at home.  Does have a history of migraines.  Also has had E. coli bacteremia from UTI around 3 months ago.  Had headaches at that time.  There was talk of an LP being done then but had refused.  Has been doing well since then. No pain with moving neck.  No vision change.   Past Medical History:  Diagnosis Date   Anxiety    Arthritis    Cancer (HCC) 1993   abnormal pap [facility] at [facility]   Constipation    Depression    GERD (gastroesophageal reflux disease)    Glaucoma 1993   History of kidney stones    Hot flashes, menopausal 06/12/2011   Hyperlipidemia    Hypertension    Neck pain 05/2008   WITH BULGING DISC-----RECIEVING EPIDURALS    Pinched nerve    back   Sinusitis     Prior to Admission medications   Medication Sig Start Date End Date Taking? Authorizing Provider  cholecalciferol (VITAMIN D3) 25 MCG (1000 UNIT) tablet Take 1,000 Units by mouth daily.    [provider]  cycloSPORINE  (RESTASIS ) 0.05 % ophthalmic emulsion Place 1 drop into both eyes 2 (two) times daily.    [provider]  esomeprazole  (NEXIUM ) 40 MG capsule TAKE 1 CAPSULE BY MOUTH TWICE  DAILY 03/29/24   Antonetta Rollene BRAVO, MD  fluticasone  (FLONASE ) 50 MCG/ACT nasal spray Place 1 spray into both nostrils daily as needed for allergies or rhinitis. 03/15/23   Antonetta Rollene BRAVO, MD  gabapentin  (NEURONTIN ) 300 MG capsule Take 1 capsule (300 mg total) by mouth 2 (two) times daily. 03/29/24   Antonetta Rollene BRAVO, MD  meloxicam  (MOBIC ) 7.5 MG tablet Take 1 tablet (7.5 mg total) by mouth daily as needed for pain. 04/25/24   Bevely Doffing, FNP  montelukast  (SINGULAIR ) 10 MG  tablet Take 1 tablet (10 mg total) by mouth daily. 04/24/24   Antonetta Rollene BRAVO, MD  Multiple Vitamin (MULTIVITAMIN) capsule Take 1 capsule by mouth daily.    [provider]  PREMARIN  vaginal cream APPLY FINGERTIP AMOUNT VAGINALLY 3 TIMES WEEKLY AS NEEDED 04/05/24   Antonetta Rollene BRAVO, MD  rosuvastatin  (CRESTOR ) 10 MG tablet Take 1 tablet (10 mg total) by mouth at bedtime. 04/24/24   Antonetta Rollene BRAVO, MD  TRAVATAN  Z 0.004 % SOLN ophthalmic solution Place 1 drop into both eyes at bedtime. 03/29/24   Antonetta Rollene BRAVO, MD  venlafaxine  XR (EFFEXOR -XR) 75 MG 24 hr capsule Take 1 capsule (75 mg total) by mouth daily with breakfast. 03/29/24 07/27/24  Antonetta Rollene BRAVO, MD    Allergies: Acyclovir  and related    Review of Systems  Neurological:  Positive for headaches.    Updated Vital Signs BP (!) 133/96   Pulse 64   Temp 98.4 F (36.9 C) (Oral)   Resp 16   Ht 5' 2 (1.575 m)   Wt 53.1 kg   SpO2 98%   BMI 21.40 kg/m   Physical Exam Vitals and nursing note reviewed.  Cardiovascular:     Rate and Rhythm: Regular rhythm.  Pulmonary:     Breath sounds: No wheezing or  rhonchi.  Abdominal:     General: There is no distension.  Musculoskeletal:     Cervical back: Neck supple. No rigidity.  Neurological:     Mental Status: She is alert.     (all labs ordered are listed, but only abnormal results are displayed) Labs Reviewed  CBC WITH DIFFERENTIAL/PLATELET  COMPREHENSIVE METABOLIC PANEL WITH GFR    EKG: None  Radiology: No results found.   Procedures   Medications Ordered in the ED  prochlorperazine  (COMPAZINE ) injection 5 mg (5 mg Intravenous Given 07/23/24 2000)  ketorolac  (TORADOL ) 15 MG/ML injection 15 mg (15 mg Intravenous Given 07/23/24 2000)  prochlorperazine  (COMPAZINE ) injection 5 mg (5 mg Intravenous Given 07/23/24 2051)                                    Medical Decision Making Amount and/or Complexity of Data Reviewed Labs:  ordered.  Risk Prescription drug management.   Patient with headache.  History of same.  Does have some pain in the back of her head.  No meningismus.  Questionably raised temperature at home but afebrile here.  Does have some chronic tingling in her left wrist after an injury.  Meningitis felt less likely.  Will get basic blood work.  Will treat symptomatically with Toradol  and Compazine .  Reviewed recent discharge notes.  Reviewed PCP notes.  White count normal.  Doubt a meningitis.  Initial Toradol  and Compazine  had some mild relief.  Second Compazine  given and feeling much better.  Will discharge home.      Final diagnoses:  Acute nonintractable headache, unspecified headache type    ED Discharge Orders     None          Patsey Lot, MD 07/23/24 2311

## 2024-07-24 ENCOUNTER — Ambulatory Visit (INDEPENDENT_AMBULATORY_CARE_PROVIDER_SITE_OTHER): Admitting: Surgical

## 2024-07-24 ENCOUNTER — Ambulatory Visit: Admitting: Gastroenterology

## 2024-07-24 DIAGNOSIS — G5602 Carpal tunnel syndrome, left upper limb: Secondary | ICD-10-CM

## 2024-07-24 DIAGNOSIS — S52502A Unspecified fracture of the lower end of left radius, initial encounter for closed fracture: Secondary | ICD-10-CM

## 2024-07-25 ENCOUNTER — Ambulatory Visit (HOSPITAL_COMMUNITY): Admitting: Occupational Therapy

## 2024-07-25 ENCOUNTER — Encounter (HOSPITAL_COMMUNITY): Payer: Self-pay | Admitting: Occupational Therapy

## 2024-07-25 DIAGNOSIS — R29898 Other symptoms and signs involving the musculoskeletal system: Secondary | ICD-10-CM | POA: Diagnosis not present

## 2024-07-25 DIAGNOSIS — M25632 Stiffness of left wrist, not elsewhere classified: Secondary | ICD-10-CM

## 2024-07-25 DIAGNOSIS — M25532 Pain in left wrist: Secondary | ICD-10-CM | POA: Diagnosis not present

## 2024-07-25 DIAGNOSIS — S52502A Unspecified fracture of the lower end of left radius, initial encounter for closed fracture: Secondary | ICD-10-CM | POA: Diagnosis not present

## 2024-07-25 NOTE — Therapy (Signed)
 OUTPATIENT OCCUPATIONAL THERAPY ORTHO TREATMENT NOTE  Patient Name: Toni Miller MRN: [id number] DOB:[birth year], 63 y.o., female Today's Date: 07/25/2024  PCP: Antonetta Quant, MD REFERRING PROVIDER: Shirly Dunnings, PA-C  OCCUPATIONAL THERAPY DISCHARGE SUMMARY  Visits from Start of Care: 3  Current functional level related to goals / functional outcomes: Pt was provided comprehensive HEP.    Remaining deficits: She continues to have pain, weakness, and limited ROM/coordination   Education / Equipment: Pt provided comprehensive HEP.    Plan: Patient discharging at this time due to upcoming carpal tunnel surgery. Will return once cleared by MD.        END OF SESSION:  OT End of Session - 07/25/24 1547     Visit Number 3    Number of Visits 8    Date for Recertification  08/16/24    Authorization Type [employer] Dual Complete    OT Start Time 1348    OT Stop Time 1430    OT Time Calculation (min) 42 min    Activity Tolerance Patient tolerated treatment well    Behavior During Therapy WFL for tasks assessed/performed            Past Medical History:  Diagnosis Date   Anxiety    Arthritis    Cancer (HCC) 1993   abnormal pap treated at [facility]   Constipation    Depression    GERD (gastroesophageal reflux disease)    Glaucoma 1993   History of kidney stones    Hot flashes, menopausal 06/12/2011   Hyperlipidemia    Hypertension    Neck pain 05/2008   WITH BULGING DISC-----RECIEVING EPIDURALS    Pinched nerve    back   Sinusitis    Past Surgical History:  Procedure Laterality Date   ABDOMINAL EXPLORATION SURGERY  age 52   bowel obstruction, [facility]   ABDOMINAL HYSTERECTOMY  2007   [facility], [facility]   BILATERAL EYE SURGERY     for glaucoma   BIOPSY  01/09/2019   Procedure: BIOPSY;  Surgeon: Harvey Margo CROME, MD;  [facility]: [facility];  Service: Endoscopy;;  gstric    bone removed from under tongue     BREAST BIOPSY Left    benign   BREAST CYST EXCISION Left  2004   BREAST SURGERY Left 2004   left partial mastectomy, [facility]   CATARACT EXTRACTION Right 06/04/2015   CATARACT EXTRACTION W/PHACO  05/16/2011   Procedure: CATARACT EXTRACTION PHACO AND INTRAOCULAR LENS PLACEMENT (IOC);  Surgeon: Cherene Mania;  Location: [facility];  Service: Ophthalmology;  Laterality: Right;   COLONOSCOPY N/A 05/17/2013   Dr. Sharla diverticulosis was noted/small internal hemorrhoids   COLONOSCOPY WITH PROPOFOL  N/A 04/12/2021   Colonoscopy June 2022: small anal sphincter s/p rectal surgery, internal hemorrhoids, one 2 mm polyp in cecum (benign). 10 year surveillance.   CYST REMOVED LEFT BREAST /BENIGN  2005   left, [facility]   ESOPHAGOGASTRODUODENOSCOPY N/A 01/09/2019   normal esophagus. Mild NSAID gastritis s/p biopsy. +H.pylori. treated with amoxicillin , biaxin , Nexium .SABRA ERADICATION DOCUMENTED   EYE SURGERY  2010, 1992 approx   bilateral   POLYPECTOMY  04/12/2021   Procedure: POLYPECTOMY;  Surgeon: Cindie Dunnings POUR, DO;  Location: [facility];  Service: Endoscopy;;   REPAIR IMPERFORATE ANUS / ANORECTOPLASTY     per patient   TOTAL ABDOMINAL HYSTERECTOMY W/ BILATERAL SALPINGOOPHORECTOMY  2007   [facility], [facility]   TUBAL LIGATION  1991   Patient Active Problem List   Diagnosis Date Noted   Elevated liver enzymes  04/27/2024   E coli bacteremia 04/11/2024   AKI (acute kidney injury) (HCC) 04/10/2024   Fever, unspecified 04/10/2024   Hypokalemia 04/10/2024   Hyponatremia 04/10/2024   Elevated transaminase level 04/10/2024   Hyperbilirubinemia 04/10/2024   Back pain 03/26/2024   High serum vitamin B12 03/26/2024   Numbness and tingling in left hand 03/26/2024   GAD (generalized anxiety disorder) 01/14/2024   Other spondylosis with radiculopathy, cervical region 12/13/2023   Impingement syndrome of left shoulder 12/13/2023   Breast pain, left 11/23/2023   Cough 09/12/2023   Immunization due 08/23/2023   Bloating 06/08/2023   Hot flashes due to menopause 04/26/2023    Great toe pain, unspecified laterality 04/21/2023   Positive self-administered antigen test for COVID-19 12/23/2022   Syncope and collapse 12/09/2022   Right knee pain 09/07/2021   Headache, variant migraine 07/07/2021   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   Menopausal vaginal dryness 10/09/2019   Knee pain, left 04/26/2019   Left hip pain 04/26/2019   Dyspepsia 12/12/2018   FH: breast cancer in first degree relative when <85 years old 06/06/2018   Neck pain 04/21/2016   Arthritis of both knees 04/25/2015   Osteopenia 04/02/2014   Depression, major, recurrent, in partial remission (HCC) 01/06/2014   H/O abnormal Pap smear 04/20/2013   Thyroid  nodule 08/16/2011   Right thigh pain 06/01/2011   Chronic constipation 06/01/2011   Low back pain with left-sided sciatica 12/29/2009   Headache(784.0) 09/26/2008   Hyperlipidemia with target LDL less than 100 01/29/2008   GLAUCOMA 01/29/2008   Hypertension 01/29/2008   GERD 01/29/2008    ONSET DATE: 04/2024 - fall  REFERRING DIAG:  M25.532 (ICD-10-CM) - Pain in left wrist  S52.502A (ICD-10-CM) - Closed fracture of distal end of left radius, unspecified fracture morphology, initial encounter    THERAPY DIAG:  Pain in left wrist  Stiffness of left wrist, not elsewhere classified  Other symptoms and signs involving the musculoskeletal system  Rationale for Evaluation and Treatment: Rehabilitation  SUBJECTIVE:   SUBJECTIVE STATEMENT: It's not changing Pt accompanied by: self  PERTINENT HISTORY: Pt has chronic L wrist pain, x-rays were negative, however MRI indicated that she has a L distal radius fracture.   PRECAUTIONS: None  WEIGHT BEARING RESTRICTIONS: Yes >2lbs  PAIN:  Are you having pain? Yes: NPRS  scale: 8/10 Pain location: dorsal/ulnar side of wrist Pain description: aching and tingling Aggravating factors: movement Relieving factors: Nothing  FALLS: Has patient fallen in last 6 months? Yes. Number of falls 1  PLOF: Independent  PATIENT GOALS: To improve pain and tingling  NEXT MD VISIT: 07/24/24  OBJECTIVE:  Note: Objective measures were completed at Evaluation unless otherwise noted.  HAND DOMINANCE: Right  ADLs: Overall ADLs: Pt unable to fasten her bra or tie her shoe laces. Also due to pain and restrictions she is unable to lift pots or pans for cooking or cleaning items.   FUNCTIONAL OUTCOME MEASURES: Quick Dash: 68.18  UPPER EXTREMITY ROM:     Active ROM Left eval  Wrist flexion 43  Wrist extension 33  Wrist ulnar deviation 18  Wrist radial deviation 2  Wrist pronation WFL  Wrist supination WFL  (Blank rows = not tested)  UPPER EXTREMITY MMT:     MMT Left eval  Wrist flexion   Wrist extension  Wrist ulnar deviation   Wrist radial deviation   Wrist pronation   Wrist supination   (Blank rows = not tested)  HAND FUNCTION: Grip strength: Right: 52 lbs; Left: 19 lbs, Lateral pinch: Right: 12 lbs, Left: 7 lbs, and 3 point pinch: Right: 9 lbs, Left: 4 lbs  COORDINATION: 9 Hole Peg test: Right: 32.22 sec; Left: 42.07 sec  SENSATION: Light touch: Impaired  Proprioception: Impaired   EDEMA: L forearm is swollen, as well as distal aspect right above the wrist.   OBSERVATIONS: moderate fascial restrictions along the forearm and palmar region of hand.    TREATMENT DATE:   07/25/24 -Manual Therapy: myofascial release applied to forearm and palm in order to reduce fascial restrictions and pain, as well as improve ROM.  -Digit ROM: composite flexion, abduction, opposition, finger taps, x10 -Tendon glides in hand, x10 -Wrist ROM: Flexion, extension, ulnar/radial deviation, supination/pronation, x10  -Digiflex: 3#, full squeeze x10, each digit  x6 -Sponges: 23, 22 -Pinch strengthening: red resistance clips, stacking 3 towers of 5 cubes  07/22/24 -Manual Therapy: myofascial release applied to forearm and palm in order to reduce fascial restrictions and pain, as well as improve ROM.  -Digit ROM: composite flexion, abduction, opposition, finger taps, x10 -Towel Crumple, x5 -Wrist ROM: Flexion, extension, ulnar/radial deviation, supination/pronation, x10  -Sponges: 8, 10, 12  07/15/24 -Wrist ROM: Flexion, extension, ulnar/radial deviation, supination/pronation, x10                                                                                                                             PATIENT EDUCATION: Education details: Continue HEP Person educated: Patient Education method: Explanation, Demonstration, and Handouts Education comprehension: verbalized understanding and returned demonstration  HOME EXERCISE PROGRAM: 9/8: Wrist ROM 9/15: Digit ROM  GOALS: Goals reviewed with patient? Yes  SHORT TERM GOALS: Target date: 08/23/24  Pt will be educated and provided a LUE HEP for L wrist mobility and ADL completion.   Goal status: MET  2.  Pt will decrease LUE pain to 2/10 or less in order to sleep for 3+ consecutive hours.   Goal status:  NOT MET  3.  Pt will decrease LUE fascial restrictions to minimal/trace amounts in order to decrease pain and improve ROM.  Goal status: NOT MET  4.  Pt will increase LUE ROM by 10* or more in order to reach and manipulate items during dressing and bathing.   Goal status: MET  5.  Pt will increase LUE strength to 4+/5 in order to hold and carry objects during cooking and cleaning tasks.   Goal status: NOT MET  6.  Pt will improve LUE coordination to completing 9 hole peg test in 33 seconds or less in order to manipulate buttons, zippers, and clasps independently.   Goal status: NOT MET   ASSESSMENT:  CLINICAL IMPRESSION: Pt working on improving her ROM and strength this  session. She attempted to complete nerve glides however was too tight  in her MCPs to effectively complete it. At the end of the session pt got a call informing her of her carpal tunnel surgery. Pt is going to discharge from OT until after her surgery.   PERFORMANCE DEFICITS: in functional skills including ADLs, IADLs, coordination, edema, ROM, strength, pain, fascial restrictions, Fine motor control, body mechanics, and UE functional use.    PLAN:  OT FREQUENCY: 2x/week  OT DURATION: 4 weeks  PLANNED INTERVENTIONS: 97168 OT Re-evaluation, 97535 self care/ADL training, 02889 therapeutic exercise, 97530 therapeutic activity, 97112 neuromuscular re-education, 97140 manual therapy, 97035 ultrasound, 97018 paraffin, 02989 moist heat, 97032 electrical stimulation (manual), passive range of motion, functional mobility training, energy conservation, coping strategies training, patient/family education, and DME and/or AE instructions  RECOMMENDED OTHER SERVICES: N/A  CONSULTED AND AGREED WITH PLAN OF CARE: Patient  PLAN FOR NEXT SESSION: Discharge   Tamma Brigandi Thelbert, OTR/L [facility] [phone] Zoe Goonan Jillyn Thelbert, OT 07/25/2024, 3:49 PM

## 2024-07-27 ENCOUNTER — Encounter: Payer: Self-pay | Admitting: Surgical

## 2024-07-27 NOTE — Progress Notes (Signed)
 Post-fracture Note   Patient: Toni Miller           Date of Birth: [birth year]           MRN: [id number] Visit Date: 07/24/2024 PCP: Antonetta Rollene BRAVO, MD   Assessment & Plan:  Chief Complaint:  Chief Complaint  Patient presents with   left wrist fracture follow up    EMG/NCV review   Visit Diagnoses:  1. Carpal tunnel syndrome, left upper limb   2. Closed fracture of distal end of left radius, unspecified fracture morphology, initial encounter     Plan: Patient is a 63 year old female who returns for reevaluation of left distal radius fracture and review of nerve conduction study.  She states that pain is slightly better but still causing her significant discomfort and limits her activities with the left hand.  She has an occasional therapy and she feels this is somewhat helpful.  Using removable wrist splint at this point.  She was previously casted for about 4 weeks after she had delayed finding of left distal radius fracture about 4 to 6 weeks after the initial injury that was only really noticeable on MRI scan.  In addition to the wrist pain both in the dorsal aspect of the radial wrist and the ulnar aspect of the wrist, she has continued numbness and tingling in the median nerve distribution.  She would like this addressed.  Nerve conduction study to demonstrate carpal tunnel syndrome.  Will plan to refer patient to Dr. Erwin for management of carpal tunnel syndrome and evaluation of left distal radius fracture to get his opinion on if this is progressively healing or more of a nonunion situation.  Patient agreed with plan.  Follow-Up Instructions: No follow-ups on file.   Orders:  Orders Placed This Encounter  Procedures   Ambulatory referral to Orthopedic Surgery   No orders of the defined types were placed in this encounter.   Imaging: No results found.  PMFS History: Patient Active Problem List   Diagnosis Date Noted   Elevated liver enzymes 04/27/2024   E  coli bacteremia 04/11/2024   AKI (acute kidney injury) (HCC) 04/10/2024   Fever, unspecified 04/10/2024   Hypokalemia 04/10/2024   Hyponatremia 04/10/2024   Elevated transaminase level 04/10/2024   Hyperbilirubinemia 04/10/2024   Back pain 03/26/2024   High serum vitamin B12 03/26/2024   Numbness and tingling in left hand 03/26/2024   GAD (generalized anxiety disorder) 01/14/2024   Other spondylosis with radiculopathy, cervical region 12/13/2023   Impingement syndrome of left shoulder 12/13/2023   Breast pain, left 11/23/2023   Cough 09/12/2023   Immunization due 08/23/2023   Bloating 06/08/2023   Hot flashes due to menopause 04/26/2023   Great toe pain, unspecified laterality 04/21/2023   Positive self-administered antigen test for COVID-19 12/23/2022   Syncope and collapse 12/09/2022   Right knee pain 09/07/2021   Headache, variant migraine 07/07/2021   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   Menopausal vaginal dryness 10/09/2019   Knee pain, left 04/26/2019   Left hip pain 04/26/2019   Dyspepsia 12/12/2018   FH: breast cancer in first degree relative when <17 years old 06/06/2018   Neck pain 04/21/2016   Arthritis of both knees 04/25/2015   Osteopenia 04/02/2014   Depression, major, recurrent, in partial remission (HCC) 01/06/2014  H/O abnormal Pap smear 04/20/2013   Thyroid  nodule 08/16/2011   Right thigh pain 06/01/2011   Chronic constipation 06/01/2011   Low back pain with left-sided sciatica 12/29/2009   Headache(784.0) 09/26/2008   Hyperlipidemia with target LDL less than 100 01/29/2008   GLAUCOMA 01/29/2008   Hypertension 01/29/2008   GERD 01/29/2008   Past Medical History:  Diagnosis Date   Anxiety    Arthritis    Cancer (HCC) 1993   abnormal  pap treated at [facility]   Constipation    Depression    GERD (gastroesophageal reflux disease)    Glaucoma 1993   History of kidney stones    Hot flashes, menopausal 06/12/2011   Hyperlipidemia    Hypertension    Neck pain 05/2008   WITH BULGING DISC-----RECIEVING EPIDURALS    Pinched nerve    back   Sinusitis     Family History  Adopted: Yes  Problem Relation Age of Onset   Alcohol abuse Mother    Stomach cancer Mother 93   Lung cancer Father    Glaucoma Father    Alcohol abuse Father    Seizures Father    Breast cancer Sister 9   Breast cancer Sister 69   Heart disease Sister    Heart attack Sister    Glaucoma Son    Glaucoma Maternal Grandfather    Cancer Maternal Grandfather        poss leukemia   SIDS Brother    Hyperthyroidism Daughter    Seizures Son        as a Development worker, international aid   Kidney disease Maternal Grandmother    Heart disease Maternal Grandmother        Visual merchandiser   Leukemia Maternal Grandmother    Colon cancer Neg Hx     Past Surgical History:  Procedure Laterality Date   ABDOMINAL EXPLORATION SURGERY  age 7   bowel obstruction, [facility]   ABDOMINAL HYSTERECTOMY  2007   [facility], [facility]   BILATERAL EYE SURGERY     for glaucoma   BIOPSY  01/09/2019   Procedure: BIOPSY;  Surgeon: Harvey Margo CROME, MD;  [facility];  Service: Endoscopy;;  gstric    bone removed from under tongue     BREAST BIOPSY Left    benign   BREAST CYST EXCISION Left 2004   BREAST SURGERY Left 2004   left partial mastectomy, [facility]   CATARACT EXTRACTION Right 06/04/2015   CATARACT EXTRACTION W/PHACO  05/16/2011   Procedure: CATARACT EXTRACTION PHACO AND INTRAOCULAR LENS PLACEMENT (IOC);  Surgeon: Cherene Mania;  Location: [facility];  Service: Ophthalmology;  Laterality: Right;   COLONOSCOPY N/A 05/17/2013   Dr. Sharla diverticulosis was noted/small internal hemorrhoids   COLONOSCOPY WITH PROPOFOL  N/A 04/12/2021   Colonoscopy June 2022: small anal sphincter s/p rectal surgery, internal  hemorrhoids, one 2 mm polyp in cecum (benign). 10 year surveillance.   CYST REMOVED LEFT BREAST /BENIGN  2005   left, [facility]   ESOPHAGOGASTRODUODENOSCOPY N/A 01/09/2019   normal esophagus. Mild NSAID gastritis s/p biopsy. +H.pylori. treated with amoxicillin , biaxin , Nexium .. ERADICATION DOCUMENTED   EYE SURGERY  2010, 1992 approx   bilateral   POLYPECTOMY  04/12/2021   Procedure: POLYPECTOMY;  Surgeon: Cindie Carlin POUR, DO;  Location: [facility];  Service: Endoscopy;;   REPAIR IMPERFORATE ANUS / ANORECTOPLASTY     per patient   TOTAL ABDOMINAL HYSTERECTOMY W/ BILATERAL SALPINGOOPHORECTOMY  2007   [facility], [facility]   TUBAL LIGATION  1991   Social  History   Occupational History   Occupation: UNEMPLOYED 2011    Comment: disability    Employer: DISABLED  Tobacco Use   Smoking status: Former    Current packs/day: 0.00    Average packs/day: 3.0 packs/day for 2.0 years (6.0 ttl pk-yrs)    Types: Cigarettes    Start date: 05/11/1989    Quit date: 05/12/1991    Years since quitting: 33.2   Smokeless tobacco: Never  Vaping Use   Vaping status: Never Used  Substance and Sexual Activity   Alcohol use: Not Currently   Drug use: Never   Sexual activity: Yes    Birth control/protection: Surgical

## 2024-07-30 ENCOUNTER — Encounter (HOSPITAL_COMMUNITY): Admitting: Occupational Therapy

## 2024-07-31 ENCOUNTER — Encounter: Payer: Self-pay | Admitting: Gastroenterology

## 2024-07-31 ENCOUNTER — Ambulatory Visit (INDEPENDENT_AMBULATORY_CARE_PROVIDER_SITE_OTHER): Admitting: Gastroenterology

## 2024-07-31 VITALS — BP 101/70 | HR 99 | Temp 97.2°F | Ht 60.5 in | Wt 116.3 lb

## 2024-07-31 DIAGNOSIS — Z860100 Personal history of colon polyps, unspecified: Secondary | ICD-10-CM | POA: Diagnosis not present

## 2024-07-31 DIAGNOSIS — K219 Gastro-esophageal reflux disease without esophagitis: Secondary | ICD-10-CM | POA: Diagnosis not present

## 2024-07-31 DIAGNOSIS — Z1212 Encounter for screening for malignant neoplasm of rectum: Secondary | ICD-10-CM

## 2024-07-31 DIAGNOSIS — Z1231 Encounter for screening mammogram for malignant neoplasm of breast: Secondary | ICD-10-CM

## 2024-07-31 MED ORDER — PANTOPRAZOLE SODIUM 40 MG PO TBEC: 40.0000 mg | DELAYED_RELEASE_TABLET | Freq: Every day | ORAL | 3 refills | Status: AC

## 2024-07-31 MED ORDER — SACCHAROMYCES BOULARDII 250 MG PO CAPS: 250.0000 mg | ORAL_CAPSULE | Freq: Two times a day (BID) | ORAL | 3 refills | Status: DC

## 2024-07-31 NOTE — Progress Notes (Signed)
 Gastroenterology Office Note     Primary Care Physician:  Antonetta Rollene BRAVO, MD  Primary Gastroenterologist: Dr. Cindie    Chief Complaint   Chief Complaint  Patient presents with   Follow-up    Pt arrives for follow up. Acid reflux is giving patient issues. Has worsened since last seeing us . Had some bleeding about 2 days, bright red blood when wiping. No blood seen in stool. Sometimes will have itching with hemorrhoids. Pt had E.Coli in July      History of Present Illness   JEANENE MENA is a 63 y.o. female presenting today with a history of  chronic constipation, bloating (negative celiac serologies in past), GERD, +H.yplori 2020 with documented eradication via breath test in Nov 2021, hemorrhoid banding in 2023, last seen in March 2025.    Has small amount of blood with wiping on 2 occasions. Unsure if she was straining. No rectal pain. Notes rectal itching. Benefiber once a day, does well for her. Sometimes stool may be hard. Interested in trying stool softener  Notes increased gas. Interested in probiotic. Fodmap trial was helpful in the past.   Nexium  once daily. If eats fried food, will have a flare. Tries to bake food. Uses butter when baking. Has been on nexium  chronically. She is interested in changing PPIs.   Broccoli and cauliflower: enjoys eating this. Causes worsening gas.    Colonoscopy 2022: small anal sphincter s/p rectal surgery, internal hemorrhoids, one 2 mm polyp in cecum benign, 10 year surveillance.   EGD 2020: normal esophagus, mild NSAID gatritis s/p biopsy, +H.pylori s/p treatment, eradication documented.    No FH colon cancer Mom: stomach cancer   Past Medical History:  Diagnosis Date   Anxiety    Arthritis    Cancer (HCC) 1993   abnormal pap treated at [facility]   Constipation    Depression    GERD (gastroesophageal reflux disease)    Glaucoma 1993   History of kidney stones    Hot flashes, menopausal 06/12/2011   Hyperlipidemia     Hypertension    Neck pain 05/2008   WITH BULGING DISC-----RECIEVING EPIDURALS    Pinched nerve    back   Sinusitis     Past Surgical History:  Procedure Laterality Date   ABDOMINAL EXPLORATION SURGERY  age 38   bowel obstruction, [facility]   ABDOMINAL HYSTERECTOMY  2007   [facility], [facility]   BILATERAL EYE SURGERY     for glaucoma   BIOPSY  01/09/2019   Procedure: BIOPSY;  Surgeon: Harvey Margo CROME, MD;  Location: [facility];  Service: Endoscopy;;  gstric    bone removed from under tongue     BREAST BIOPSY Left    benign   BREAST CYST EXCISION Left 2004   BREAST SURGERY Left 2004   left partial mastectomy, [facility]   CATARACT EXTRACTION Right 06/04/2015   CATARACT EXTRACTION W/PHACO  05/16/2011   Procedure: CATARACT EXTRACTION PHACO AND INTRAOCULAR LENS PLACEMENT (IOC);  Surgeon: Cherene Mania;  Location: [facility];  Service: Ophthalmology;  Laterality: Right;   COLONOSCOPY N/A 05/17/2013   Dr. Sharla diverticulosis was noted/small internal hemorrhoids   COLONOSCOPY WITH PROPOFOL  N/A 04/12/2021   Colonoscopy June 2022: small anal sphincter s/p rectal surgery, internal hemorrhoids, one 2 mm polyp in cecum (benign). 10 year surveillance.   CYST REMOVED LEFT BREAST /BENIGN  2005   left, [facility]   ESOPHAGOGASTRODUODENOSCOPY N/A 01/09/2019   normal esophagus. Mild NSAID gastritis s/p biopsy. +H.pylori. treated with  amoxicillin , biaxin , Nexium .SABRA ERADICATION DOCUMENTED   EYE SURGERY  2010, 1992 approx   bilateral   POLYPECTOMY  04/12/2021   Procedure: POLYPECTOMY;  Surgeon: Cindie Carlin POUR, DO;  Location: [facility];  Service: Endoscopy;;   REPAIR IMPERFORATE ANUS / ANORECTOPLASTY     per patient   TOTAL ABDOMINAL HYSTERECTOMY W/ BILATERAL SALPINGOOPHORECTOMY  2007   [facility], [facility]   TUBAL LIGATION  1991    Current Outpatient Medications  Medication Sig Dispense Refill   cholecalciferol (VITAMIN D3) 25 MCG (1000 UNIT) tablet Take 1,000 Units by mouth daily.     cycloSPORINE  (RESTASIS ) 0.05 %  ophthalmic emulsion Place 1 drop into both eyes 2 (two) times daily.     esomeprazole  (NEXIUM ) 40 MG capsule TAKE 1 CAPSULE BY MOUTH TWICE  DAILY 60 capsule 11   fluticasone  (FLONASE ) 50 MCG/ACT nasal spray Place 1 spray into both nostrils daily as needed for allergies or rhinitis. 16 mL 2   gabapentin  (NEURONTIN ) 300 MG capsule Take 1 capsule (300 mg total) by mouth 2 (two) times daily. 60 capsule 11   meloxicam  (MOBIC ) 7.5 MG tablet Take 1 tablet (7.5 mg total) by mouth daily as needed for pain. 30 tablet 3   montelukast  (SINGULAIR ) 10 MG tablet Take 1 tablet (10 mg total) by mouth daily. 80 tablet 3   Multiple Vitamin (MULTIVITAMIN) capsule Take 1 capsule by mouth daily.     PREMARIN  vaginal cream APPLY FINGERTIP AMOUNT VAGINALLY 3 TIMES WEEKLY AS NEEDED 30 g 2   rosuvastatin  (CRESTOR ) 10 MG tablet Take 1 tablet (10 mg total) by mouth at bedtime. 100 tablet 2   TRAVATAN  Z 0.004 % SOLN ophthalmic solution Place 1 drop into both eyes at bedtime.     venlafaxine  XR (EFFEXOR -XR) 75 MG 24 hr capsule Take 1 capsule (75 mg total) by mouth daily with breakfast. 30 capsule 3   No current facility-administered medications for this visit.    Allergies as of 07/31/2024 - Review Complete 07/31/2024  Allergen Reaction Noted   Acyclovir  and related Rash 03/02/2015    Family History  Adopted: Yes  Problem Relation Age of Onset   Alcohol abuse Mother    Stomach cancer Mother 30   Lung cancer Father    Glaucoma Father    Alcohol abuse Father    Seizures Father    Breast cancer Sister 41   Breast cancer Sister 63   Heart disease Sister    Heart attack Sister    Glaucoma Son    Glaucoma Maternal Grandfather    Cancer Maternal Grandfather        poss leukemia   SIDS Brother    Hyperthyroidism Daughter    Seizures Son        as a Development worker, international aid   Kidney disease Maternal Grandmother    Heart disease Maternal Grandmother        Visual merchandiser   Leukemia Maternal Grandmother    Colon cancer Neg Hx      Social History   Socioeconomic History   Marital status: Significant Other    Spouse name: Not on file   Number of children: 3   Years of education: 12   Highest education level: 12th grade  Occupational History   Occupation: UNEMPLOYED 2011    Comment: disability    Employer: DISABLED  Tobacco Use   Smoking status: Former    Current packs/day: 0.00    Average packs/day: 3.0 packs/day for 2.0 years (6.0 ttl pk-yrs)  Types: Cigarettes    Start date: 05/11/1989    Quit date: 05/12/1991    Years since quitting: 33.2   Smokeless tobacco: Never  Vaping Use   Vaping status: Never Used  Substance and Sexual Activity   Alcohol use: Not Currently   Drug use: Never   Sexual activity: Yes    Birth control/protection: Surgical  Other Topics Concern   Not on file  Social History Narrative   Lives with son, Elspeth. 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   caffien- coffee, 1 cup daily   Social Drivers of Health   Financial Resource Strain: Low Risk  (10/25/2023)   Overall Financial Resource Strain (CARDIA)    Difficulty of Paying Living Expenses: Not hard at all  Food Insecurity: No Food Insecurity (04/16/2024)   Hunger Vital Sign    Worried About Running Out of Food in the Last Year: Never true    Ran Out of Food in the Last Year: Never true  Transportation Needs: No Transportation Needs (04/16/2024)   PRAPARE - Administrator, Civil Service (Medical): No    Lack of Transportation (Non-Medical): No  Physical Activity: Insufficiently Active (10/25/2023)   Exercise Vital Sign    Days of Exercise per Week: 3 days    Minutes of Exercise per Session: 20 min  Stress: Stress Concern Present (10/25/2023)   [employer] of Occupational Health - Occupational Stress Questionnaire    Feeling of Stress : To some extent  Social Connections: Moderately Isolated (10/25/2023)   Social Connection and Isolation Panel    Frequency of Communication with Friends and Family: Three times a week     Frequency of Social Gatherings with Friends and Family: Never    Attends Religious Services: More than 4 times per year    Active Member of [employer] or Organizations: No    Attends Banker Meetings: Never    Marital Status: Divorced  Catering manager Violence: Not At Risk (04/16/2024)   Humiliation, Afraid, Rape, and Kick questionnaire    Fear of Current or Ex-Partner: No    Emotionally Abused: No    Physically Abused: No    Sexually Abused: No     Review of Systems   Gen: Denies any fever, chills, fatigue, weight loss, lack of appetite.  CV: Denies chest pain, heart palpitations, peripheral edema, syncope.  Resp: Denies shortness of breath at rest or with exertion. Denies wheezing or cough.  GI: Denies dysphagia or odynophagia. Denies jaundice, hematemesis, fecal incontinence. GU : Denies urinary burning, urinary frequency, urinary hesitancy MS: Denies joint pain, muscle weakness, cramps, or limitation of movement.  Derm: Denies rash, itching, dry skin Psych: Denies depression, anxiety, memory loss, and confusion Heme: Denies bruising, bleeding, and enlarged lymph nodes.   Physical Exam   BP 101/70   Pulse 99   Temp (!) 97.2 F (36.2 C)   Ht 5' 0.5 (1.537 m)   Wt 116 lb 4.8 oz (52.8 kg)   BMI 22.34 kg/m  General:   Alert and oriented. Pleasant and cooperative. Well-nourished and well-developed.  Head:  Normocephalic and atraumatic. Eyes:  Without icterus Abdomen:  +BS, soft, non-tender and non-distended. No HSM noted. No guarding or rebound. No masses appreciated.  Rectal:  modified rectum s/p imperforate anus repair in past, small internal hemorrhoids at entrance but not prolapsing, no mass palpated on DRE, no blood, no discomfort Msk:  Symmetrical without gross deformities. Normal posture. Extremities:  Without edema. Neurologic:  Alert and  oriented x4;  grossly normal neurologically. Skin:  Intact without significant lesions or rashes. Psych:  Alert and  cooperative. Normal mood and affect.   Assessment   KILYN MARAGH is a 63 y.o. female presenting today with a history of  chronic constipation, bloating (negative celiac serologies in past), GERD, +H.yplori 2020 with documented eradication via breath test in Nov 2021, hemorrhoid banding in 2023, last seen in March 2025, presenting for routine follow-up.  GERD not ideally managed on Nexium  daily, which she has been on chronically. Dietary behaviors largely contributing. Discussed at length. Change to pantoprazole  once daily, stop nexium . No alarm signs/symptoms.  Hematochezia: 2 occasions of low volume blood with wiping in setting of harder stool, likely constipation. DRE without concerning findings, colonoscopy last in 2022 with benign polyp. Prior banding with good results. Suspect internal hemorrhoids as culprit. Start colace once to BID. Cologuard for routine screening. If persistent bleeding, needs colonoscopy although suspect this is benign anorectal source.   Gas/bloating: secondary to dietary behaviors. Noted improvement following low FODMAP diet. Increased symptoms correlating with identified foods as above. Start probiotic, change diet. Could have element of SIBO. No alarm sign/symptoms.      PLAN   Colace once to BID Probiotic daily Trial of pantoprazole  instead of Nexium , sent to pharmacy Cologuard test Return in 3 months   Therisa MICAEL Stager, PhD, [facility]

## 2024-07-31 NOTE — Patient Instructions (Addendum)
 You can try docusate sodium (colace) once or twice a day to help soften stool.   I have sent in a probiotic to take. If this isn't covered, you can try Align or Restora over the counter.   Let's stop Nexium . Let's try pantoprazole  once daily, 30 minutes before breakfast.   I have requested the cologuard test.  We can see you back in 3 months!!

## 2024-08-01 ENCOUNTER — Encounter (HOSPITAL_COMMUNITY): Admitting: Occupational Therapy

## 2024-08-02 ENCOUNTER — Ambulatory Visit (HOSPITAL_COMMUNITY): Admitting: Psychiatry

## 2024-08-02 DIAGNOSIS — F411 Generalized anxiety disorder: Secondary | ICD-10-CM | POA: Diagnosis not present

## 2024-08-02 NOTE — Progress Notes (Signed)
 Virtual Visit via Telephone Note  I connected with Toni Miller on 08/02/24 at 10:10 AM by telephone and verified that I am speaking with the correct person using two identifiers.  Location: Patient: Home Provider: Home Office   I discussed the limitations, risks, security and privacy concerns of performing an evaluation and management service by telephone and the availability of in person appointments. I also discussed with the patient that there may be a patient responsible charge related to this service. The patient expressed understanding and agreed to proceed.   I provided 25 minutes of non-face-to-face time during this encounter.   Winton FORBES Rubinstein, LCSW    THERAPIST PROGRESS NOTE  Session Time: Friday  Friday 07/19/2024  10:10 AM -  10:35 AM  Participation Level: Active  Behavioral Response: Alert/Euthymic  Type of Therapy: Individual Therapy  Treatment Goals addressed:  Decrease worry episodes to 1 x per week Decrease worry episodes to 1 x per week  Learn and implement relaxation techniques, practice a technique daily        ProgressTowards Goals:  Progressing  Interventions: Supportive/CBT  Summary: Toni Miller is a 63 y.o. female who   is referred for services by Redell Corn, LCSW.   She is a returning patient to this clinician as she was seen about 5 years ago. . She denies any psychiatric hospitalizations.  Patient reports worrying a lot beginning about 4 to 5 months ago due to experiencing bowel issues.  It per her report, issues are better now but memories of BMP done in childhood due to bowel issues have been triggered.  She reports additional stress related to concerns about daughter who has thyroid  issues and her son who has high blood pressure.  Patient worries about how to support and help her children.  Patient reports fatigue, irritability, and sleep difficulty.  She reports additional worries related to her living situation as her apartment needs  repair but landlord has not made repairs despite patient's repeated requests per her report.  Patient last was seen about 2 weeks ago.  Patient denies having any symptoms of anxiety or depression since last session.  She reports having only 1 accident regarding GI issues. Per patient's report, she was not bothered by this and did not experience any negative thoughts about self.  She continues to work with her GI specialist and reports beginning 2 new medications today and is hopeful this will control her GI issues even better.  She reports consistent involvement in activities including doing household tasks, visiting with family, attending church, and participating in hobbies such as art and doing crochet.  She is looking forward to participating in a carnival sponsored by her church next month.  She is in the process of collecting items for the carnival.  Patient reports practicing deep breathing regularly.  She did not use the interactive audio activity, beach visualization.  However, she reports doing the beach visualization on her own and reports finding this helpful. Patient is very pleased with her progress in therapy and expresses confidence in her ability to use helpful coping strategies successfully to address future issues.     Suicidal/Homicidal: No      Therapist Response: Reviewed symptoms, praised and reinforced patient's consistent behavioral activation, discussed effects, assisted patient and examine her response to recent accident, praised and reinforced patient's use of self-talk and involvement in activities, discussed effects, discussed patient's progress and treatment, assisted patient develop and mental health maintenance plan, therapist and patient agreed to do  termination at this time as patient is pleased with her progress in treatment and expresses confidence in her ability to use helpful coping strategies successfully, patient is encouraged to contact this practice should she need  psychotherapy services in the future   Diagnosis: Generalized anxiety disorder  Collaboration of Care: Primary Care Provider AEB patient sees PCP Dr. Rollene Pesa for medication management  Patient/Guardian was advised Release of Information must be obtained prior to any record release in order to collaborate their care with an outside provider. Patient/Guardian was advised if they have not already done so to contact the registration department to sign all necessary forms in order for us  to release information regarding their care.   Consent: Patient/Guardian gives verbal consent for treatment and assignment of benefits for services provided during this visit. Patient/Guardian expressed understanding and agreed to proceed.   Winton FORBES Rubinstein, LCSW 08/02/2024  Outpatient Therapist Discharge Summary  Toni Miller    12-20-1960   Admission Date: 05/07/2024   Discharge Date:  08/02/2024 Reason for Discharge: Treatment goals accomplished Diagnosis:  Axis I:  GAD (generalized anxiety disorder)   Comments: Patient is encouraged to contact this practice should she need psychotherapy services in the future.  Rahkeem Senft E Shavawn Stobaugh LCSW

## 2024-08-05 ENCOUNTER — Encounter (HOSPITAL_COMMUNITY): Payer: Self-pay

## 2024-08-05 ENCOUNTER — Ambulatory Visit (HOSPITAL_COMMUNITY)
Admission: RE | Admit: 2024-08-05 | Discharge: 2024-08-05 | Disposition: A | Discharge status: Home / Self Care | Source: Ambulatory Visit | Attending: Family Medicine | Admitting: Family Medicine

## 2024-08-05 DIAGNOSIS — Z1231 Encounter for screening mammogram for malignant neoplasm of breast: Secondary | ICD-10-CM | POA: Insufficient documentation

## 2024-08-06 ENCOUNTER — Encounter (HOSPITAL_COMMUNITY): Admitting: Occupational Therapy

## 2024-08-08 ENCOUNTER — Encounter (HOSPITAL_COMMUNITY): Admitting: Occupational Therapy

## 2024-08-09 DIAGNOSIS — D72829 Elevated white blood cell count, unspecified: Secondary | ICD-10-CM | POA: Diagnosis not present

## 2024-08-12 ENCOUNTER — Ambulatory Visit: Admitting: Orthopedic Surgery

## 2024-08-12 ENCOUNTER — Other Ambulatory Visit: Payer: Self-pay

## 2024-08-12 DIAGNOSIS — G5602 Carpal tunnel syndrome, left upper limb: Secondary | ICD-10-CM

## 2024-08-12 MED ORDER — LIDOCAINE HCL 1 % IJ SOLN
1.0000 mL | INTRAMUSCULAR | Status: AC | PRN
  Administered 2024-08-12: 1 mL

## 2024-08-12 MED ORDER — BETAMETHASONE SOD PHOS & ACET 6 (3-3) MG/ML IJ SUSP
6.0000 mg | INTRAMUSCULAR | Status: AC | PRN
  Administered 2024-08-12: 6 mg via INTRA_ARTICULAR

## 2024-08-12 NOTE — Progress Notes (Signed)
 Toni Miller - 63 y.o. female MRN [id number]  Date of birth: 1961-03-27  Office Visit Note: Visit Date: 08/12/2024 PCP: Antonetta Rollene BRAVO, MD Referred by: Shirly Carlin CROME, PA-C  Subjective: No chief complaint on file.  HPI: Toni Miller is a pleasant 63 y.o. female who presents today for evaluation of ongoing left hand numbness and tingling in the setting of a recent left wrist distal radius fracture managed nonoperatively.  She states that she did have some mild numbness and tingling prior to her injury, however feels that the numbness and tingling has worsened more recently.  Injury to the distal radius occurred approximately 5 months prior, did casting for approximately 6 to 8 weeks afterwards with appropriate resolution of wrist pain.  Does have some ongoing soreness with range of motion.  She has been sent to me today by Herlene Shirly PA for specific hand surgical evaluation.  Pertinent ROS were reviewed with the patient and found to be negative unless otherwise specified above in HPI.   Visit Reason: left wrist Duration of symptoms: 5 months Hand dominance: right Occupation: retired Diabetic: No Smoking: No Heart/Lung History: none Blood Thinners: none  Prior Testing/EMG: MRI 05/23/24, EMG 07/09/24 Injections (Date): none Treatments: brace Prior Surgery: none    Assessment & Plan: Visit Diagnoses:  1. Carpal tunnel syndrome, left upper limb     Plan: Extensive discussion was had with the patient today about her ongoing left sided carpal tunnel syndrome.  We discussed the underlying etiology and pathophysiology of carpal tunnel syndrome, I did explain that there could be a posttraumatic element of this development, however given that she had symptoms prior to her injury, this likely worsened an underlying issue.  Patient has both clinical and electrodiagnostic evidence to confirm this diagnosis.    We discussed treatment modalities ranging from conservative to  surgical.  She has already tried extensive bracing and activity modification.  I did explain that we could try cortisone injection to the right carpal tunnel today for diagnostic and therapeutic value.  I explained that at this juncture, should symptoms remain refractory to conservative care, she is indicated for left open versus endoscopic carpal tunnel release.  Risks and benefits of both operations were discussed in detail today.   Risks include but not limited to infection, bleeding, scarring, stiffness, nerve injury or vascular, tendon injury, risk of recurrence and need for subsequent operation were all discussed in detail.   For the time being, she would like to proceed with left wrist cortisone injection to the carpal tunnel for diagnostic and therapeutic value.  Injection was provided today under ultrasound guidance.  Risk and benefit of the injection were discussed in detail and patient elects to proceed.  She will follow-up in approximate 6 weeks.   Follow-up: No follow-ups on file.   Meds & Orders: No orders of the defined types were placed in this encounter.   Orders Placed This Encounter  Procedures   Hand/UE Inj   XR Wrist Complete Left     Procedures: Hand/UE Inj: L carpal tunnel for carpal tunnel syndrome on 08/12/2024 6:56 PM Indications: diagnostic and therapeutic Details: 25 G needle, ultrasound-guided Medications: 1 mL lidocaine  1 %; 6 mg betamethasone  acetate-betamethasone  sodium phosphate 6 (3-3) MG/ML Outcome: tolerated well, no immediate complications  Images saved in the [facility] ultrasound database Procedure, treatment alternatives, risks and benefits explained, specific risks discussed.          Clinical History: 08/08/2019 EMG/NCS bilater UE  Impression: essentially  NORMAL.  [name], MD [facility] Neurology  She reports that she quit smoking about 33 years ago. Her smoking use included cigarettes. She started smoking about 35 years ago. She has a 6  pack-year smoking history. She has never used smokeless tobacco. No results for input(s): HGBA1C, LABURIC in the last 8760 hours.  Objective:   Vital Signs: There were no vitals taken for this visit.  Physical Exam  Gen: Well-appearing, in no acute distress; non-toxic CV: Regular Rate. Well-perfused. Warm.  Resp: Breathing unlabored on room air; no wheezing. Psych: Fluid speech in conversation; appropriate affect; normal thought process  Ortho Exam PHYSICAL EXAM:  General: Patient is well appearing and in no distress.   Skin and Muscle: No skin changes are apparent to upper extremities.   Range of Motion and Palpation Tests: Mobility is full about the elbows with flexion and extension. Forearm supination and pronation are 85/85 left side, 75/70 right.  Wrist flexion/extension is 75/65 left side, right side 55/45. Digital flexion and extension are full.  Thumb opposition is full to the base of the small fingers bilaterally.    No cords or nodules are palpated.  No triggering is observed.    Neurologic, Vascular, Motor: Sensation is slightly diminished to light touch in the right median nerve distribution.    Thenar atrophy: Negative right Tinel sign: Positive right carpal tunnel Carpal tunnel compression: Positive right Phalen test: Positive right   Motor right hand FPL: 5/5 Index FDP: 5/5 APB: 5/5  Fingers pink and well perfused.  Capillary refill is brisk.     Lab Results  Component Value Date   HGBA1C 5.4 07/07/2021     Imaging: No results found.  Past Medical/Family/Surgical/Social History: Medications & Allergies reviewed per EMR, new medications updated. Patient Active Problem List   Diagnosis Date Noted   Elevated liver enzymes 04/27/2024   E coli bacteremia 04/11/2024   AKI (acute kidney injury) 04/10/2024   Fever, unspecified 04/10/2024   Hypokalemia 04/10/2024   Hyponatremia 04/10/2024   Elevated transaminase level 04/10/2024    Hyperbilirubinemia 04/10/2024   Back pain 03/26/2024   High serum vitamin B12 03/26/2024   Numbness and tingling in left hand 03/26/2024   GAD (generalized anxiety disorder) 01/14/2024   Other spondylosis with radiculopathy, cervical region 12/13/2023   Impingement syndrome of left shoulder 12/13/2023   Breast pain, left 11/23/2023   Cough 09/12/2023   Encounter for colorectal cancer screening using Cologuard test 08/23/2023   Immunization due 08/23/2023   Bloating 06/08/2023   Hot flashes due to menopause 04/26/2023   Great toe pain, unspecified laterality 04/21/2023   Positive self-administered antigen test for COVID-19 12/23/2022   Syncope and collapse 12/09/2022   Right knee pain 09/07/2021   Headache, variant migraine 07/07/2021   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   Menopausal vaginal dryness 10/09/2019   Knee pain, left 04/26/2019   Left hip pain 04/26/2019   Dyspepsia 12/12/2018   FH: breast cancer in first degree relative when <88 years old 06/06/2018   Neck pain 04/21/2016   Arthritis of both knees 04/25/2015   Osteopenia 04/02/2014   Depression, major, recurrent, in partial remission 01/06/2014   H/O abnormal Pap smear 04/20/2013   Thyroid  nodule 08/16/2011   Right thigh pain 06/01/2011   Chronic constipation 06/01/2011   Low back pain  with left-sided sciatica 12/29/2009   Headache(784.0) 09/26/2008   Hyperlipidemia with target LDL less than 100 01/29/2008   GLAUCOMA 01/29/2008   Hypertension 01/29/2008   GERD 01/29/2008   Past Medical History:  Diagnosis Date   Anxiety    Arthritis    Cancer (HCC) 1993   abnormal pap treated at [facility]   Constipation    Depression    GERD (gastroesophageal reflux disease)    Glaucoma 1993   History of kidney  stones    Hot flashes, menopausal 06/12/2011   Hyperlipidemia    Hypertension    Neck pain 05/2008   WITH BULGING DISC-----RECIEVING EPIDURALS    Pinched nerve    back   Sinusitis    Family History  Adopted: Yes  Problem Relation Age of Onset   Alcohol abuse Mother    Stomach cancer Mother 32   Lung cancer Father    Glaucoma Father    Alcohol abuse Father    Seizures Father    Breast cancer Sister 53   Breast cancer Sister 7   Heart disease Sister    Heart attack Sister    Glaucoma Son    Glaucoma Maternal Grandfather    Cancer Maternal Grandfather        poss leukemia   SIDS Brother    Hyperthyroidism Daughter    Seizures Son        as a Development worker, international aid   Kidney disease Maternal Grandmother    Heart disease Maternal Grandmother        Visual merchandiser   Leukemia Maternal Grandmother    Colon cancer Neg Hx    Past Surgical History:  Procedure Laterality Date   ABDOMINAL EXPLORATION SURGERY  age 57   bowel obstruction, [facility]   ABDOMINAL HYSTERECTOMY  2007   [facility], [facility]   BILATERAL EYE SURGERY     for glaucoma   BIOPSY  01/09/2019   Procedure: BIOPSY;  Surgeon: Harvey Margo CROME, MD;  Location: [facility];  Service: Endoscopy;;  gstric    bone removed from under tongue     BREAST BIOPSY Left    benign   BREAST CYST EXCISION Left 2004   BREAST SURGERY Left 2004   left partial mastectomy, [facility]   CATARACT EXTRACTION Right 06/04/2015   CATARACT EXTRACTION W/PHACO  05/16/2011   Procedure: CATARACT EXTRACTION PHACO AND INTRAOCULAR LENS PLACEMENT (IOC);  Surgeon: Cherene Mania;  Location: [facility] OR[facility];  Service: Ophthalmology;  Laterality: Right;   COLONOSCOPY N/A 05/17/2013   Dr. Sharla diverticulosis was noted/small internal hemorrhoids   COLONOSCOPY WITH PROPOFOL  N/A 04/12/2021   Colonoscopy June 2022: small anal sphincter s/p rectal surgery, internal hemorrhoids, one 2 mm polyp in cecum (benign). 10 year surveillance.   CYST REMOVED LEFT BREAST /BENIGN  2005   left, [facility]    ESOPHAGOGASTRODUODENOSCOPY N/A 01/09/2019   normal esophagus. Mild NSAID gastritis s/p biopsy. +H.pylori. treated with amoxicillin , biaxin , Nexium .. ERADICATION DOCUMENTED   EYE SURGERY  2010, 1992 approx   bilateral   POLYPECTOMY  04/12/2021   Procedure: POLYPECTOMY;  Surgeon: Cindie Carlin POUR, DO;  Location: [facility];  Service: Endoscopy;;   REPAIR IMPERFORATE ANUS / ANORECTOPLASTY     per patient   TOTAL ABDOMINAL HYSTERECTOMY W/ BILATERAL SALPINGOOPHORECTOMY  2007   [facility], [facility]   TUBAL LIGATION  1991   Social History   Occupational History   Occupation: UNEMPLOYED 2011    Comment: disability    Employer: DISABLED  Tobacco Use   Smoking status: Former  Current packs/day: 0.00    Average packs/day: 3.0 packs/day for 2.0 years (6.0 ttl pk-yrs)    Types: Cigarettes    Start date: 05/11/1989    Quit date: 05/12/1991    Years since quitting: 33.2   Smokeless tobacco: Never  Vaping Use   Vaping status: Never Used  Substance and Sexual Activity   Alcohol use: Not Currently   Drug use: Never   Sexual activity: Yes    Birth control/protection: Surgical    Alexsis Branscom Estela) Arlinda, M.D. [facility], Hand Surgery

## 2024-08-13 LAB — COLOGUARD

## 2024-08-15 ENCOUNTER — Encounter (HOSPITAL_COMMUNITY): Admitting: Occupational Therapy

## 2024-08-17 DIAGNOSIS — Z1211 Encounter for screening for malignant neoplasm of colon: Secondary | ICD-10-CM | POA: Diagnosis not present

## 2024-08-17 DIAGNOSIS — Z1212 Encounter for screening for malignant neoplasm of rectum: Secondary | ICD-10-CM | POA: Diagnosis not present

## 2024-08-19 ENCOUNTER — Encounter (HOSPITAL_COMMUNITY): Admitting: Occupational Therapy

## 2024-08-20 ENCOUNTER — Ambulatory Visit: Payer: Self-pay | Admitting: Gastroenterology

## 2024-08-23 ENCOUNTER — Ambulatory Visit: Payer: Self-pay

## 2024-08-24 LAB — COLOGUARD: COLOGUARD: NEGATIVE

## 2024-08-29 ENCOUNTER — Other Ambulatory Visit: Payer: Self-pay | Admitting: Psychiatry

## 2024-08-30 ENCOUNTER — Ambulatory Visit: Payer: Self-pay

## 2024-08-30 ENCOUNTER — Telehealth: Payer: Self-pay

## 2024-08-30 ENCOUNTER — Telehealth: Payer: Self-pay | Admitting: Family Medicine

## 2024-08-30 MED ORDER — ESOMEPRAZOLE MAGNESIUM 40 MG PO CPDR: DELAYED_RELEASE_CAPSULE | ORAL | 11 refills | Status: DC

## 2024-08-30 NOTE — Telephone Encounter (Signed)
 Copied from CRM [id number]. Topic: Clinical - Prescription Issue >> Aug 30, 2024  3:56 PM Avram MATSU wrote: Reason for CRM: Hoy has some concerns about patients medication, please advise [phone]

## 2024-08-30 NOTE — Telephone Encounter (Signed)
 FYI Only or Action Required?: FYI only for provider.  Patient was last seen in primary care on 06/04/2024 by Bevely Doffing, FNP.  Called Nurse Triage reporting Gastroesophageal Reflux.  Symptoms began several days ago.  Interventions attempted: Prescription medications: Protonix , Nexium .  Symptoms are: gradually worsening.  Triage Disposition: Information or Advice Only Call  Patient/caregiver understands and will follow disposition?: Yes  **See note below**         Copied from CRM #[id number]. Topic: Clinical - Red Word Triage >> Aug 30, 2024  2:49 PM Fonda T wrote: [facility] that prompted transfer to Nurse Triage: Patient states she has a burning sensation in chest with discomfort, and burning sensation in throat, patient reports symptoms are worsening due to being out of medication for indigestion.  Patient is calling for medication to be refilled as soon as possible, as she states she is out of Nexium  medication. Reason for Disposition  Health information question, no triage required and triager able to answer question  Answer Assessment - Initial Assessment Questions Patient calling with indigestion symptoms. She is calling in for a Nexium  refill. Refill request has been sent. Pt. Advised that refill requests may take up to 3 days to process. Patient being seen by GI provider; she will call on Monday to follow up with that provider to inform them that the medication is not working. She reports increased indigestion and heart burn.  Answer Assessment - Initial Assessment Questions 1. REASON FOR CALL: What is the main reason for your call? or How can I best help you?   Patient calling with indigestion symptoms. She is calling in for a Nexium  refill. Refill request has been sent. Pt. Advised that refill requests may take up to 3 days to process. Patient being seen by GI provider Dr. Shirlean; she will call on Monday 10/27 to follow up with that provider to inform them that the  medication is not working or relieving her heartburn. She reports increased indigestion and heart burn over the past several days. She will call back if needed.  Protocols used: Chest Pain-A-AH, Information Only Call - No Triage-A-AH

## 2024-08-30 NOTE — Telephone Encounter (Signed)
 Copied from CRM [id number]. Topic: Clinical - Medication Refill >> Aug 30, 2024  2:56 PM Fonda T wrote: Medication: esomeprazole  (NEXIUM ) 40 MG capsule  Has the patient contacted their pharmacy? Yes   This is the patient's preferred pharmacy:  [facility] - [place], [place] - [address] [place] [address] Phone: [phone] Fax: [phone]    Is this the correct pharmacy for this prescription? Yes If no, delete pharmacy and type the correct one.   Has the prescription been filled recently? Yes  Is the patient out of the medication? Yes  Has the patient been seen for an appointment in the last year OR does the patient have an upcoming appointment? Yes  Can we respond through MyChart? Yes  Agent: Please be advised that Rx refills may take up to 3 business days. We ask that you follow-up with your pharmacy.

## 2024-09-02 ENCOUNTER — Telehealth: Payer: Self-pay

## 2024-09-02 ENCOUNTER — Telehealth: Payer: Self-pay | Admitting: Family Medicine

## 2024-09-02 MED ORDER — VENLAFAXINE HCL ER 75 MG PO CP24
75.0000 mg | ORAL_CAPSULE | Freq: Every day | ORAL | 3 refills | Status: AC
Start: 2024-09-02 — End: 2024-12-31

## 2024-09-02 MED ORDER — ESOMEPRAZOLE MAGNESIUM 40 MG PO CPDR: 40.0000 mg | DELAYED_RELEASE_CAPSULE | Freq: Every day | ORAL | 3 refills | Status: DC

## 2024-09-02 NOTE — Telephone Encounter (Signed)
 Returned the pt's call and was advised that the Pantoprazole  was not working and she wants to go back on Nexium . She states her heartburn is worse @ night than during the day. Please advise

## 2024-09-02 NOTE — Telephone Encounter (Signed)
 Phoned both of the pt's phones listed and LMOVM regarding her medications

## 2024-09-02 NOTE — Addendum Note (Signed)
 Addended by: SHIRLEAN THERISA ORN on: 09/02/2024 02:02 PM   Modules accepted: Orders

## 2024-09-02 NOTE — Telephone Encounter (Signed)
 I sent in Nexium  for her. She can take Pepcid  20 mg each evening as needed.

## 2024-09-02 NOTE — Telephone Encounter (Signed)
 Copied from CRM [id number]. Topic: Clinical - Medication Refill >> Sep 02, 2024 11:08 AM Fonda T wrote: Medication: venlafaxine  XR (EFFEXOR -XR) 75 MG 24 hr capsule  Has the patient contacted their pharmacy? Yes, advised to contact office, patient states she has no more refills left   This is the patient's preferred pharmacy:  [facility] - [place], [place] - [address] [place] [address] Phone: [phone] Fax: [phone]    Is this the correct pharmacy for this prescription? Yes If no, delete pharmacy and type the correct one.   Has the prescription been filled recently? Yes  Is the patient out of the medication? Yes  Has the patient been seen for an appointment in the last year OR does the patient have an upcoming appointment? Yes  Can we respond through MyChart? Yes  Agent: Please be advised that Rx refills may take up to 3 business days. We ask that you follow-up with your pharmacy.

## 2024-09-02 NOTE — Telephone Encounter (Signed)
 Copied from CRM [id number]. Topic: Clinical - Medication Question >> Sep 02, 2024  9:44 AM Lonell PEDLAR wrote: Reason for CRM: Hoy, pharmacist, called requesting clarification on rx esomeprazole  (NEXIUM ) 40 MG capsule. She states that pt is already taking pantoprazole  (PROTONIX ) 40 MG tablet, which was prescribed by her G.I and pt received a 90 day supply for this medication. Please call asap to provide clarification. C/b: [phone]

## 2024-09-02 NOTE — Telephone Encounter (Signed)
 Refill sent to pharmacy.

## 2024-09-03 NOTE — Telephone Encounter (Signed)
 Advised pharmacy that they should reach out to the GI office for clarification

## 2024-09-03 NOTE — Telephone Encounter (Signed)
 Both were prescribed by GI so she needs to be contacted to provide clarification on which the patient should be on

## 2024-09-09 ENCOUNTER — Encounter: Payer: Self-pay | Admitting: Radiology

## 2024-09-12 ENCOUNTER — Ambulatory Visit: Admitting: Family Medicine

## 2024-09-12 ENCOUNTER — Encounter: Payer: Self-pay | Admitting: Family Medicine

## 2024-09-12 VITALS — BP 124/90 | HR 81 | Resp 18 | Ht 60.0 in | Wt 116.1 lb

## 2024-09-12 DIAGNOSIS — M25551 Pain in right hip: Secondary | ICD-10-CM

## 2024-09-12 DIAGNOSIS — Z0001 Encounter for general adult medical examination with abnormal findings: Secondary | ICD-10-CM

## 2024-09-12 DIAGNOSIS — I10 Essential (primary) hypertension: Secondary | ICD-10-CM | POA: Diagnosis not present

## 2024-09-12 DIAGNOSIS — E785 Hyperlipidemia, unspecified: Secondary | ICD-10-CM

## 2024-09-12 DIAGNOSIS — G8929 Other chronic pain: Secondary | ICD-10-CM | POA: Diagnosis not present

## 2024-09-12 DIAGNOSIS — Z23 Encounter for immunization: Secondary | ICD-10-CM | POA: Diagnosis not present

## 2024-09-12 DIAGNOSIS — E559 Vitamin D deficiency, unspecified: Secondary | ICD-10-CM

## 2024-09-12 MED ORDER — GABAPENTIN 300 MG PO CAPS: 300.0000 mg | ORAL_CAPSULE | Freq: Two times a day (BID) | ORAL | 11 refills | Status: AC

## 2024-09-12 MED ORDER — KETOROLAC TROMETHAMINE 60 MG/2ML IM SOLN
60.0000 mg | Freq: Once | INTRAMUSCULAR | Status: AC
  Administered 2024-09-12: 30 mg via INTRAMUSCULAR

## 2024-09-12 NOTE — Progress Notes (Signed)
    Toni Miller     MRN: [id number]      DOB: [birth year]  Chief Complaint  Patient presents with   Annual Exam    Cpe    Numbness    Pt complains of ongoing numbness and tingling of the fingers of her left hand     HPI: Patient is in for annual physical exam. C/o tinging in left hand, ongoing Increased left hip pain x 2 weeks, no inciting trauma Immunization is reviewed , and  updated .   PE: BP (!) 124/90   Pulse 81   Resp 18   Ht 5' (1.524 m)   Wt 116 lb 1.9 oz (52.7 kg)   SpO2 97%   BMI 22.68 kg/m   Pleasant  female, alert and oriented x 3, in no cardio-pulmonary distress. Afebrile. HEENT No facial trauma or asymetry. Sinuses non tender.  Extra occullar muscles intact.. External ears normal, . Neck: supple, no adenopathy,JVD or thyromegaly.No bruits.  Chest: Clear to ascultation bilaterally.No crackles or wheezes. Non tender to palpation  Cardiovascular system; Heart sounds normal,  S1 and  S2 ,no S3.  No murmur, or thrill. Apical beat not displaced Peripheral pulses normal.  Abdomen: Soft, non tender, no organomegaly or masses. No bruits. Bowel sounds normal. No guarding, tenderness or rebound.    Musculoskeletal exam: Full ROM of spine,  shoulders and knees.tender over right hip joint with reduced ROM No deformity ,swelling or crepitus noted. No muscle wasting or atrophy.   Neurologic: Cranial nerves 2 to 12 intact. Power, tone ,sensation  normal throughout. No disturbance in gait. No tremor.  Skin: Intact, no ulceration, erythema , scaling or rash noted. Pigmentation normal throughout  Psych; Normal mood and affect. Judgement and concentration normal   Assessment & Plan:  Chronic right hip pain Flare x 2 weeks, Toradol  60 mg IM in office  Encounter for Medicare annual examination with abnormal findings Annual exam as documented. Counseling done  re healthy lifestyle involving commitment to 150 minutes exercise per week, heart  healthy diet, and attaining healthy weight.The importance of adequate sleep also discussed. Regular seat belt use and home safety, is also discussed. Changes in health habits are decided on by the patient with goals and time frames  set for achieving them. Immunization and cancer screening needs are specifically addressed at this visit.   Need for pneumococcal 20-valent conjugate vaccination After obtaining informed consent, the vaccine is  administered , with no adverse effect noted at the time of administration.

## 2024-09-12 NOTE — Patient Instructions (Addendum)
 F/U in 5 months  Lipid panel, TSH and  vit D today  Pneumonia vaccine today  Toradol  30 mg IM in office for right hip pain today  Please drink only caffeine  free drinks  to reduce reflux symptoms  Cut out salt and canned foods to improve blood pressure please  It is important that you exercise regularly at least 30 minutes 5 times a week. If you develop chest pain, have severe difficulty breathing, or feel very tired, stop exercising immediately and seek medical attention   Thanks for choosing [facility], we consider it a privelige to serve you.

## 2024-09-13 ENCOUNTER — Ambulatory Visit: Payer: Self-pay | Admitting: Family Medicine

## 2024-09-13 LAB — LIPID PANEL
Chol/HDL Ratio: 2.5 ratio (ref 0.0–4.4)
Cholesterol, Total: 176 mg/dL (ref 100–199)
HDL: 71 mg/dL (ref >39)
LDL Chol Calc (NIH): 87 mg/dL (ref 0–99)
Triglycerides: 99 mg/dL (ref 0–149)
VLDL Cholesterol Cal: 18 mg/dL (ref 5–40)

## 2024-09-13 LAB — TSH: TSH: 1.21 u[IU]/mL (ref 0.450–4.500)

## 2024-09-13 LAB — VITAMIN D 25 HYDROXY (VIT D DEFICIENCY, FRACTURES): Vit D, 25-Hydroxy: 50.3 ng/mL (ref 30.0–100.0)

## 2024-09-15 DIAGNOSIS — Z23 Encounter for immunization: Secondary | ICD-10-CM | POA: Insufficient documentation

## 2024-09-15 DIAGNOSIS — E559 Vitamin D deficiency, unspecified: Secondary | ICD-10-CM | POA: Insufficient documentation

## 2024-09-15 DIAGNOSIS — Z0001 Encounter for general adult medical examination with abnormal findings: Secondary | ICD-10-CM | POA: Insufficient documentation

## 2024-09-15 NOTE — Assessment & Plan Note (Signed)
 Flare x 2 weeks, Toradol  60 mg IM in office

## 2024-09-15 NOTE — Assessment & Plan Note (Signed)

## 2024-09-15 NOTE — Assessment & Plan Note (Signed)
 After obtaining informed consent, the vaccine is  administered , with no adverse effect noted at the time of administration.

## 2024-09-22 NOTE — Progress Notes (Unsigned)
 Toni Miller - 63 y.o. female MRN [id number]  Date of birth: [birth year]  Office Visit Note: Visit Date: 09/23/2024 PCP: Antonetta Rollene BRAVO, MD Referred by: Antonetta Rollene BRAVO, MD  Subjective: No chief complaint on file.  HPI: Toni Miller is a pleasant 63 y.o. female who returns today for follow-up of ongoing left hand numbness and tingling in the setting of a recent left wrist distal radius fracture managed nonoperatively.  She states that she did have some mild numbness and tingling prior to her injury, however feels that the numbness and tingling has worsened more recently.  Injury to the distal radius occurred approximately 6 months prior, did casting for approximately 6 to 8 weeks afterwards with appropriate resolution of wrist pain.  Does have some ongoing soreness with range of motion.  At her most recent visit approxi-6 weeks ago, she underwent cortisone injection to the left carpal tunnel region for diagnostic and therapeutic purposes.  She states that she got minimal relief lasting from the injection.  She does have an EMG study that confirms carpal tunnel syndrome.   Pertinent ROS were reviewed with the patient and found to be negative unless otherwise specified above in HPI.       Assessment & Plan: Visit Diagnoses:  1. Carpal tunnel syndrome, left upper limb      Plan: Extensive discussion was had with the patient today about her ongoing left sided carpal tunnel syndrome.  We discussed the underlying etiology and pathophysiology of carpal tunnel syndrome, I did explain that there could be a posttraumatic element of this development, however given that she had symptoms prior to her injury, this likely worsened an underlying issue.  Patient has both clinical and electrodiagnostic evidence to confirm this diagnosis.    We discussed treatment modalities ranging from conservative to surgical.  She has already tried extensive bracing and activity modification as well as  recent injection. I explained that at this juncture, given that symptoms remain refractory to conservative care, she is indicated for left open versus endoscopic carpal tunnel release.  Risks and benefits of both operations were discussed in detail today.   Risks include but not limited to infection, bleeding, scarring, stiffness, nerve injury or vascular, tendon injury, risk of recurrence and need for subsequent operation were all discussed in detail.   She would like to proceed with left endoscopic carpal tunnel release at the next available date.   Follow-up: No follow-ups on file.   Meds & Orders: No orders of the defined types were placed in this encounter.   No orders of the defined types were placed in this encounter.    Procedures: No procedures performed      Clinical History: 08/08/2019 EMG/NCS bilater UE  Impression: essentially NORMAL.  [name], MD [facility]  She reports that she quit smoking about 33 years ago. Her smoking use included cigarettes. She started smoking about 35 years ago. She has a 6 pack-year smoking history. She has never used smokeless tobacco. No results for input(s): HGBA1C, LABURIC in the last 8760 hours.  Objective:   Vital Signs: There were no vitals taken for this visit.  Physical Exam  Gen: Well-appearing, in no acute distress; non-toxic CV: Regular Rate. Well-perfused. Warm.  Resp: Breathing unlabored on room air; no wheezing. Psych: Fluid speech in conversation; appropriate affect; normal thought process  Ortho Exam PHYSICAL EXAM:  General: Patient is well appearing and in no distress.   Skin and Muscle: No skin changes are apparent  to upper extremities.   Range of Motion and Palpation Tests: Mobility is full about the elbows with flexion and extension. Forearm supination and pronation are 85/85 left side, 75/70 right.  Wrist flexion/extension is 75/65 left side, right side 55/45. Digital flexion and extension are  full.  Thumb opposition is full to the base of the small fingers bilaterally.    No cords or nodules are palpated.  No triggering is observed.    Neurologic, Vascular, Motor: Sensation is slightly diminished to light touch in the left median nerve distribution.    Thenar atrophy: Negative left Tinel sign: Positive left carpal tunnel Carpal tunnel compression: Positive left Phalen test: Positive left   Motor left hand FPL: 5/5 Index FDP: 5/5 APB: 5/5  Fingers pink and well perfused.  Capillary refill is brisk.     Lab Results  Component Value Date   HGBA1C 5.4 07/07/2021     Imaging: No results found.  Past Medical/Family/Surgical/Social History: Medications & Allergies reviewed per EMR, new medications updated. Patient Active Problem List   Diagnosis Date Noted   Encounter for Medicare annual examination with abnormal findings 09/15/2024   Need for pneumococcal 20-valent conjugate vaccination 09/15/2024   Vitamin D  deficiency 09/15/2024   Elevated liver enzymes 04/27/2024   Hypokalemia 04/10/2024   Hyponatremia 04/10/2024   Elevated transaminase level 04/10/2024   Hyperbilirubinemia 04/10/2024   Back pain 03/26/2024   High serum vitamin B12 03/26/2024   Numbness and tingling in left hand 03/26/2024   GAD (generalized anxiety disorder) 01/14/2024   Other spondylosis with radiculopathy, cervical region 12/13/2023   Impingement syndrome of left shoulder 12/13/2023   Cough 09/12/2023   Immunization due 08/23/2023   Bloating 06/08/2023   Hot flashes due to menopause 04/26/2023   Positive self-administered antigen test for COVID-19 12/23/2022   Syncope and collapse 12/09/2022   Right knee pain 09/07/2021   Headache, variant migraine 07/07/2021   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   Prolapsed internal hemorrhoids, grade 3 06/03/2021   Mild intermittent asthma without complication 06/19/2020   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    Menopausal vaginal dryness 10/09/2019   Knee pain, left 04/26/2019   Chronic right hip pain 04/26/2019   Dyspepsia 12/12/2018   FH: breast cancer in first degree relative when <78 years old 06/06/2018   Neck pain 04/21/2016   Arthritis of both knees 04/25/2015   Osteopenia 04/02/2014   Depression, major, recurrent, in partial remission 01/06/2014   H/O abnormal Pap smear 04/20/2013   Thyroid  nodule 08/16/2011   Right thigh pain 06/01/2011   Chronic constipation 06/01/2011   Low back pain with left-sided sciatica 12/29/2009   Headache(784.0) 09/26/2008   Hyperlipidemia with target LDL less than 100 01/29/2008   GLAUCOMA 01/29/2008   Hypertension 01/29/2008   GERD 01/29/2008   Past Medical History:  Diagnosis Date   Anxiety    Arthritis    Cancer (HCC) 1993   abnormal pap [facility] at [facility]   Constipation    Depression    GERD (gastroesophageal reflux disease)    Glaucoma 1993   History of kidney stones    Hot flashes, menopausal 06/12/2011   Hyperlipidemia    Hypertension    Neck pain 05/2008   WITH BULGING DISC-----RECIEVING EPIDURALS    Pinched nerve    back   Sinusitis    Family History  Adopted: Yes  Problem Relation Age of Onset   Alcohol abuse Mother    Stomach cancer Mother 46   Lung  cancer Father    Glaucoma Father    Alcohol abuse Father    Seizures Father    Breast cancer Sister 17   Breast cancer Sister 43   Heart disease Sister    Heart attack Sister    Glaucoma Son    Glaucoma Maternal Grandfather    Cancer Maternal Grandfather        poss leukemia   SIDS Brother    Hyperthyroidism Daughter    Seizures Son        as a development worker, international aid   Kidney disease Maternal Grandmother    Heart disease Maternal Grandmother        visual merchandiser   Leukemia Maternal Grandmother    Colon cancer Neg Hx    Past Surgical History:  Procedure Laterality Date   ABDOMINAL EXPLORATION SURGERY  age 5   bowel obstruction, [facility]   ABDOMINAL HYSTERECTOMY  2007   [facility], [facility]    BILATERAL EYE SURGERY     for glaucoma   BIOPSY  01/09/2019   Procedure: BIOPSY;  Surgeon: Harvey Margo CROME, MD;  [facility];  Service: Endoscopy;;  gstric    bone removed from under tongue     BREAST BIOPSY Left    benign   BREAST CYST EXCISION Left 2004   BREAST SURGERY Left 2004   left partial mastectomy, [facility]   CATARACT EXTRACTION Right 06/04/2015   CATARACT EXTRACTION W/PHACO  05/16/2011   Procedure: CATARACT EXTRACTION PHACO AND INTRAOCULAR LENS PLACEMENT (IOC);  Surgeon: Cherene Mania;  Location: [facility];  Service: Ophthalmology;  Laterality: Right;   COLONOSCOPY N/A 05/17/2013   Dr. Sharla diverticulosis was noted/small internal hemorrhoids   COLONOSCOPY WITH PROPOFOL  N/A 04/12/2021   Colonoscopy June 2022: small anal sphincter s/p rectal surgery, internal hemorrhoids, one 2 mm polyp in cecum (benign). 10 year surveillance.   CYST REMOVED LEFT BREAST /BENIGN  2005   left, [facility]   ESOPHAGOGASTRODUODENOSCOPY N/A 01/09/2019   normal esophagus. Mild NSAID gastritis s/p biopsy. +H.pylori. treated with amoxicillin , biaxin , Nexium .SABRA ERADICATION DOCUMENTED   EYE SURGERY  2010, 1992 approx   bilateral   POLYPECTOMY  04/12/2021   Procedure: POLYPECTOMY;  Surgeon: Cindie Carlin POUR, DO;  Location[facility];  Service: Endoscopy;;   REPAIR IMPERFORATE ANUS / ANORECTOPLASTY     per patient   TOTAL ABDOMINAL HYSTERECTOMY W/ BILATERAL SALPINGOOPHORECTOMY  2007   [facility], [facility]   TUBAL LIGATION  1991   Social History   Occupational History   Occupation: UNEMPLOYED 2011    Comment: disability    Employer: DISABLED  Tobacco Use   Smoking status: Former    Current packs/day: 0.00    Average packs/day: 3.0 packs/day for 2.0 years (6.0 ttl pk-yrs)    Types: Cigarettes    Start date: 05/11/1989    Quit date: 05/12/1991    Years since quitting: 33.3   Smokeless tobacco: Never  Vaping Use   Vaping status: Never Used  Substance and Sexual Activity   Alcohol use: Not Currently    Drug use: Never   Sexual activity: Yes    Birth control/protection: Surgical    Wilberth Damon Estela) Arlinda, M.D. [facility], Hand Surgery

## 2024-09-23 ENCOUNTER — Ambulatory Visit: Admitting: Orthopedic Surgery

## 2024-09-23 DIAGNOSIS — G5602 Carpal tunnel syndrome, left upper limb: Secondary | ICD-10-CM | POA: Diagnosis not present

## 2024-10-01 ENCOUNTER — Other Ambulatory Visit: Payer: Self-pay

## 2024-10-01 DIAGNOSIS — G5602 Carpal tunnel syndrome, left upper limb: Secondary | ICD-10-CM

## 2024-10-02 ENCOUNTER — Ambulatory Visit
Admission: EM | Admit: 2024-10-02 | Discharge: 2024-10-02 | Disposition: A | Discharge status: Home / Self Care | Attending: Nurse Practitioner | Admitting: Nurse Practitioner

## 2024-10-02 ENCOUNTER — Ambulatory Visit: Payer: Self-pay

## 2024-10-02 DIAGNOSIS — R21 Rash and other nonspecific skin eruption: Secondary | ICD-10-CM | POA: Diagnosis not present

## 2024-10-02 DIAGNOSIS — Z113 Encounter for screening for infections with a predominantly sexual mode of transmission: Secondary | ICD-10-CM | POA: Diagnosis present

## 2024-10-02 MED ORDER — TRIAMCINOLONE ACETONIDE 0.1 % EX CREA: 1.0000 | TOPICAL_CREAM | Freq: Two times a day (BID) | CUTANEOUS | 0 refills | Status: AC

## 2024-10-02 NOTE — ED Provider Notes (Signed)
 [facility]    CSN: [id number] Arrival date & time: 10/02/24  1029      History   Chief Complaint Chief Complaint  Patient presents with   Rash    HPI Toni Miller is a 63 y.o. female.   The history is provided by the patient.   Patient presents for complaints of a rash to her right buttock.  Patient states symptoms have been present for the past 3 to 4 days.  She denies exposure to new soaps, medications, lotions, foods, or detergents.  She states that the rash is itchy.  States that she has been using a cream to the area.  Patient also requesting STI testing.  She denies vaginal symptoms to include vaginal odor, vaginal itching, or vaginal discharge.  She reports that she just wants to get checked.  Reports that she is currently sexually active.  Past Medical History:  Diagnosis Date   Anxiety    Arthritis    Cancer (HCC) 1993   abnormal pap [facility] at [facility]   Constipation    Depression    GERD (gastroesophageal reflux disease)    Glaucoma 1993   History of kidney stones    Hot flashes, menopausal 06/12/2011   Hyperlipidemia    Hypertension    Neck pain 05/2008   WITH BULGING DISC-----RECIEVING EPIDURALS    Pinched nerve    back   Sinusitis     Patient Active Problem List   Diagnosis Date Noted   Encounter for Medicare annual examination with abnormal findings 09/15/2024   Need for pneumococcal 20-valent conjugate vaccination 09/15/2024   Vitamin D  deficiency 09/15/2024   Elevated liver enzymes 04/27/2024   Hypokalemia 04/10/2024   Hyponatremia 04/10/2024   Elevated transaminase level 04/10/2024   Hyperbilirubinemia 04/10/2024   Back pain 03/26/2024   High serum vitamin B12 03/26/2024   Numbness and tingling in left hand 03/26/2024   GAD (generalized anxiety disorder) 01/14/2024   Other spondylosis with radiculopathy, cervical region 12/13/2023   Impingement syndrome of left shoulder 12/13/2023   Cough 09/12/2023   Immunization due  08/23/2023   Bloating 06/08/2023   Hot flashes due to menopause 04/26/2023   Positive self-administered antigen test for COVID-19 12/23/2022   Syncope and collapse 12/09/2022   Right knee pain 09/07/2021   Headache, variant migraine 07/07/2021   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   Prolapsed internal hemorrhoids, grade 3 06/03/2021   Mild intermittent asthma without complication 06/19/2020   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   Menopausal vaginal dryness 10/09/2019   Knee pain, left 04/26/2019   Chronic right hip pain 04/26/2019   Dyspepsia 12/12/2018   FH: breast cancer in first degree relative when <30 years old 06/06/2018   Neck pain 04/21/2016   Arthritis of both knees 04/25/2015   Osteopenia 04/02/2014   Depression, major, recurrent, in partial remission 01/06/2014   H/O abnormal Pap smear 04/20/2013   Thyroid  nodule 08/16/2011   Right thigh pain 06/01/2011   Chronic constipation 06/01/2011   Low back pain with left-sided sciatica 12/29/2009   Headache(784.0) 09/26/2008   Hyperlipidemia with target LDL less than 100 01/29/2008   GLAUCOMA 01/29/2008   Hypertension 01/29/2008   GERD 01/29/2008    Past Surgical History:  Procedure Laterality Date   ABDOMINAL EXPLORATION SURGERY  age 53   bowel obstruction, [facility]   ABDOMINAL HYSTERECTOMY  2007   [facility], [facility]   BILATERAL EYE SURGERY     for glaucoma   BIOPSY  01/09/2019  Procedure: BIOPSY;  Surgeon: Harvey Margo CROME, MD;  [facility];  Service: Endoscopy;;  gstric    bone removed from under tongue     BREAST BIOPSY Left    benign   BREAST CYST EXCISION Left 2004   BREAST SURGERY Left 2004   left partial mastectomy, [facility]   CATARACT EXTRACTION Right 06/04/2015   CATARACT EXTRACTION W/PHACO  05/16/2011   Procedure: CATARACT EXTRACTION PHACO AND INTRAOCULAR LENS PLACEMENT (IOC);  Surgeon: Cherene Mania;  Location: [facility];  Service: Ophthalmology;  Laterality: Right;   COLONOSCOPY  N/A 05/17/2013   Dr. Sharla diverticulosis was noted/small internal hemorrhoids   COLONOSCOPY WITH PROPOFOL  N/A 04/12/2021   Colonoscopy June 2022: small anal sphincter s/p rectal surgery, internal hemorrhoids, one 2 mm polyp in cecum (benign). 10 year surveillance.   CYST REMOVED LEFT BREAST /BENIGN  2005   left, [facility]   ESOPHAGOGASTRODUODENOSCOPY N/A 01/09/2019   normal esophagus. Mild NSAID gastritis s/p biopsy. +H.pylori. treated with amoxicillin , biaxin , Nexium .. ERADICATION DOCUMENTED   EYE SURGERY  2010, 1992 approx   bilateral   POLYPECTOMY  04/12/2021   Procedure: POLYPECTOMY;  Surgeon: Cindie Carlin POUR, DO;  [facility];  Service: Endoscopy;;   REPAIR IMPERFORATE ANUS / ANORECTOPLASTY     per patient   TOTAL ABDOMINAL HYSTERECTOMY W/ BILATERAL SALPINGOOPHORECTOMY  2007   [facility], [facility]   TUBAL LIGATION  1991    OB History   No obstetric history on file.      Home Medications    Prior to Admission medications   Medication Sig Start Date End Date Taking? Authorizing Provider  cholecalciferol (VITAMIN D3) 25 MCG (1000 UNIT) tablet Take 1,000 Units by mouth daily.   Yes [provider]  gabapentin  (NEURONTIN ) 300 MG capsule Take 1 capsule (300 mg total) by mouth 2 (two) times daily. 09/12/24  Yes Antonetta Rollene BRAVO, MD  Multiple Vitamin (MULTIVITAMIN) capsule Take 1 capsule by mouth daily.   Yes [provider]  pantoprazole  (PROTONIX ) 40 MG tablet Take 1 tablet (40 mg total) by mouth daily. 30 minutes before breakfast 07/31/24  Yes Shirlean Therisa ORN, NP  rosuvastatin  (CRESTOR ) 10 MG tablet Take 1 tablet (10 mg total) by mouth at bedtime. 04/24/24  Yes Antonetta Rollene BRAVO, MD  saccharomyces boulardii (FLORASTOR) 250 MG capsule Take 1 capsule (250 mg total) by mouth 2 (two) times daily. 07/31/24  Yes Shirlean Therisa ORN, NP  TRAVATAN  Z 0.004 % SOLN ophthalmic solution Place 1 drop into both eyes at bedtime. 03/29/24  Yes Antonetta Rollene BRAVO, MD  venlafaxine  XR  (EFFEXOR -XR) 75 MG 24 hr capsule Take 1 capsule (75 mg total) by mouth daily with breakfast. 09/02/24 12/31/24 Yes Antonetta Rollene BRAVO, MD  cycloSPORINE  (RESTASIS ) 0.05 % ophthalmic emulsion Place 1 drop into both eyes 2 (two) times daily.    [provider]  fluticasone  (FLONASE ) 50 MCG/ACT nasal spray Place 1 spray into both nostrils daily as needed for allergies or rhinitis. 03/15/23   Antonetta Rollene BRAVO, MD  meloxicam  (MOBIC ) 7.5 MG tablet Take 1 tablet (7.5 mg total) by mouth daily as needed for pain. 04/25/24   Bevely Doffing, FNP  montelukast  (SINGULAIR ) 10 MG tablet Take 1 tablet (10 mg total) by mouth daily. 04/24/24   Antonetta Rollene BRAVO, MD  PREMARIN  vaginal cream APPLY FINGERTIP AMOUNT VAGINALLY 3 TIMES WEEKLY AS NEEDED 04/05/24   Antonetta Rollene BRAVO, MD    Family History Family History  Adopted: Yes  Problem Relation Age of Onset  Alcohol abuse Mother    Stomach cancer Mother 40   Lung cancer Father    Glaucoma Father    Alcohol abuse Father    Seizures Father    Breast cancer Sister 22   Breast cancer Sister 67   Heart disease Sister    Heart attack Sister    Glaucoma Son    Glaucoma Maternal Grandfather    Cancer Maternal Grandfather        poss leukemia   SIDS Brother    Hyperthyroidism Daughter    Seizures Son        as a development worker, international aid   Kidney disease Maternal Grandmother    Heart disease Maternal Grandmother        pace maker   Leukemia Maternal Grandmother    Colon cancer Neg Hx     Social History Social History   Tobacco Use   Smoking status: Former    Current packs/day: 0.00    Average packs/day: 3.0 packs/day for 2.0 years (6.0 ttl pk-yrs)    Types: Cigarettes    Start date: 05/11/1989    Quit date: 05/12/1991    Years since quitting: 33.4   Smokeless tobacco: Never  Vaping Use   Vaping status: Never Used  Substance Use Topics   Alcohol use: Not Currently   Drug use: Never     Allergies   Acyclovir  and related   Review of Systems Review of  Systems Per HPI  Physical Exam Triage Vital Signs ED Triage Vitals  Encounter Vitals Group     BP 10/02/24 1105 132/87     Girls Systolic BP Percentile --      Girls Diastolic BP Percentile --      Boys Systolic BP Percentile --      Boys Diastolic BP Percentile --      Pulse Rate 10/02/24 1105 76     Resp --      Temp 10/02/24 1105 98.5 F (36.9 C)     Temp Source 10/02/24 1105 Oral     SpO2 10/02/24 1105 95 %     Weight --      Height --      Head Circumference --      Peak Flow --      Pain Score 10/02/24 1106 3     Pain Loc --      Pain Education --      Exclude from Growth Chart --    No data found.  Updated Vital Signs BP 132/87 (BP Location: Right Arm)   Pulse 76   Temp 98.5 F (36.9 C) (Oral)   SpO2 95%   Visual Acuity Right Eye Distance:   Left Eye Distance:   Bilateral Distance:    Right Eye Near:   Left Eye Near:    Bilateral Near:     Physical Exam Vitals and nursing note reviewed.  Constitutional:      General: She is not in acute distress.    Appearance: Normal appearance.  HENT:     Head: Normocephalic.  Eyes:     Extraocular Movements: Extraocular movements intact.     Pupils: Pupils are equal, round, and reactive to light.  Cardiovascular:     Rate and Rhythm: Normal rate and regular rhythm.     Pulses: Normal pulses.     Heart sounds: Normal heart sounds.  Pulmonary:     Effort: Pulmonary effort is normal. No respiratory distress.     Breath sounds: Normal breath sounds. No  stridor. No wheezing, rhonchi or rales.  Abdominal:     General: Bowel sounds are normal.     Palpations: Abdomen is soft.  Genitourinary:    Comments: GU exam deferred, self swab performed  Musculoskeletal:     Cervical back: Normal range of motion.  Skin:    General: Skin is warm and dry.     Findings: Rash present.      Neurological:     General: No focal deficit present.     Mental Status: She is alert and oriented to person, place, and time.   Psychiatric:        Mood and Affect: Mood normal.        Behavior: Behavior normal.      UC Treatments / Results  Labs (all labs ordered are listed, but only abnormal results are displayed) Labs Reviewed - No data to display  EKG   Radiology No results found.  Procedures Procedures (including critical care time)  Medications Ordered in UC Medications - No data to display  Initial Impression / Assessment and Plan / UC Course  I have reviewed the triage vital signs and the nursing notes.  Pertinent labs & imaging results that were available during my care of the patient were reviewed by me and considered in my medical decision making (see chart for details).  Patient with rash and nonspecific skin eruption noted to the right buttock.  No obvious erythema or signs of inflammation present.  She does have scabbing noted to the affected area, suggestive of healing.  Will treat with triamcinolone  cream 0.1% for patient to apply topically.  Cytology swab is pending.  Supportive care recommendations were provided and discussed with the patient to include over-the-counter antihistamines for itching, and applying cool compresses as needed.  Patient advised she will be contacted if the cytology swab results are abnormal.  Patient advised she we will also have access to the results via MyChart.  Patient was in agreement with this plan of care and verbalizes understanding.  All questions were answered.  Patient stable for discharge.   Final Clinical Impressions(s) / UC Diagnoses   Final diagnoses:  None   Discharge Instructions   None    ED Prescriptions   None    PDMP not reviewed this encounter.   Gilmer Etta PARAS, NP 10/02/24 1128

## 2024-10-02 NOTE — Telephone Encounter (Signed)
 Noted patient advised urgent care

## 2024-10-02 NOTE — Discharge Instructions (Signed)
 Apply medication as prescribed. May also take over-the-counter Zyrtec , Claritin, or Allegra during the daytime for itching.  You may take Benadryl  at bedtime for itching. Avoid hot baths or showers while symptoms persist.  Recommend taking lukewarm baths. May apply cool cloths to the area to help with itching or discomfort. Avoid scratching, rubbing, or manipulating the areas while symptoms persist. Recommend Aveeno colloidal oatmeal bath to use to help with drying and itching.  STI screening: Cytology swab is pending.  You will be contacted if the pending test result is abnormal.  You will also have access to the results via MyChart. Refrain from sexual intercourse until your test results are received. If your test results are abnormal, you will need to notify all partners.  If treatment is indicated, you will need to refrain from sexual intercourse for an additional 7 days after completing treatment. Increase condom use with each sexual encounter. Follow-up as needed.

## 2024-10-02 NOTE — ED Triage Notes (Signed)
 Pt states she has a rash on her right buttocks x 1 week. Pt states it is slightly itchy and painful. Pt says she is also having the itching in her vagina x 1 week.

## 2024-10-02 NOTE — Telephone Encounter (Signed)
 FYI Only or Action Required?: FYI only for provider: UC.  Patient was last seen in primary care on 09/12/2024 by Antonetta Rollene BRAVO, MD.  Called Nurse Triage reporting Rash.  Symptoms began several days ago.  Interventions attempted: Nothing.  Symptoms are: unchanged.  Triage Disposition: See HCP Within 4 Hours (Or PCP Triage)  Patient/caregiver understands and will follow disposition?: Yes   Copied from CRM [id number]. Topic: Clinical - Red Word Triage >> Oct 02, 2024  8:52 AM Larissa RAMAN wrote: [facility] that prompted transfer to Nurse Triage: rash with pain- Rt side of buttocks Reason for Disposition  [1] Purple or blood-colored rash (spots or dots) AND [2] no fever AND [3] sounds well to triager  Answer Assessment - Initial Assessment Questions No available appts with pcp. Offered alternative provider, patient declined due to transportation.  Patient reports will have daughter to take to nearest UC.  Advised call back or UC/ED if symptoms worsen. Encouraged hand hygiene and not to scratch.  1. APPEARANCE of RASH: What does the rash look like? (e.g., blisters, dry flaky skin, red spots, redness, sores)     Round,dark purple color, dry;  2. SIZE: How big are the spots? (e.g., tip of pen, eraser, coin; inches, centimeters)     Tip pen 3. LOCATION: Where is the rash located?     Right buttock 4. COLOR: What color is the rash? (Note: It is difficult to assess rash color in people with darker-colored skin. When this situation occurs, simply ask the caller to describe what they see.)     Dark purple color 5. ONSET: When did the rash begin?     Sunday 6. FEVER: Do you have a fever? If Yes, ask: What is your temperature, how was it measured, and when did it start?     Denies fever, chills, n/v 7. ITCHING: Does the rash itch? If Yes, ask: How bad is the itch? (Scale 1-10; or mild, moderate, severe)     No; mild 8. CAUSE: What do you think is causing the rash?      unsure 9. MEDICINE FACTORS: Have you started any new medicines within the last 2 weeks? (e.g., antibiotics)      no 10. OTHER SYMPTOMS: Do you have any other symptoms? (e.g., dizziness, headache, sore throat, joint pain) Denies sob, dizziness, HA, neck pain/ stiffness, sore throat, joint pain  Protocols used: Rash or Redness - [facility]

## 2024-10-04 LAB — CERVICOVAGINAL ANCILLARY ONLY
Chlamydia: NEGATIVE
Comment: NEGATIVE
Comment: NEGATIVE
Comment: NORMAL
Neisseria Gonorrhea: NEGATIVE
Trichomonas: NEGATIVE

## 2024-10-18 ENCOUNTER — Encounter (HOSPITAL_BASED_OUTPATIENT_CLINIC_OR_DEPARTMENT_OTHER): Payer: Self-pay | Admitting: Orthopedic Surgery

## 2024-10-18 ENCOUNTER — Other Ambulatory Visit: Payer: Self-pay

## 2024-10-25 ENCOUNTER — Encounter (HOSPITAL_BASED_OUTPATIENT_CLINIC_OR_DEPARTMENT_OTHER): Payer: Self-pay | Admitting: Orthopedic Surgery

## 2024-10-25 ENCOUNTER — Ambulatory Visit (HOSPITAL_BASED_OUTPATIENT_CLINIC_OR_DEPARTMENT_OTHER)
Admission: RE | Admit: 2024-10-25 | Discharge: 2024-10-25 | Disposition: A | Discharge status: Home / Self Care | Attending: Orthopedic Surgery | Admitting: Orthopedic Surgery

## 2024-10-25 ENCOUNTER — Ambulatory Visit (HOSPITAL_BASED_OUTPATIENT_CLINIC_OR_DEPARTMENT_OTHER): Admitting: Anesthesiology

## 2024-10-25 ENCOUNTER — Encounter (HOSPITAL_BASED_OUTPATIENT_CLINIC_OR_DEPARTMENT_OTHER): Admission: RE | Disposition: A | Payer: Self-pay | Source: Home / Self Care | Attending: Orthopedic Surgery

## 2024-10-25 DIAGNOSIS — G5602 Carpal tunnel syndrome, left upper limb: Secondary | ICD-10-CM

## 2024-10-25 DIAGNOSIS — K219 Gastro-esophageal reflux disease without esophagitis: Secondary | ICD-10-CM | POA: Insufficient documentation

## 2024-10-25 DIAGNOSIS — I10 Essential (primary) hypertension: Secondary | ICD-10-CM | POA: Diagnosis not present

## 2024-10-25 DIAGNOSIS — Z87891 Personal history of nicotine dependence: Secondary | ICD-10-CM | POA: Diagnosis not present

## 2024-10-25 DIAGNOSIS — Z01818 Encounter for other preprocedural examination: Secondary | ICD-10-CM

## 2024-10-25 DIAGNOSIS — J45909 Unspecified asthma, uncomplicated: Secondary | ICD-10-CM | POA: Insufficient documentation

## 2024-10-25 HISTORY — PX: CARPAL TUNNEL RELEASE: SHX101

## 2024-10-25 SURGERY — RELEASE, CARPAL TUNNEL, ENDOSCOPIC
Anesthesia: General | Site: Wrist | Laterality: Left

## 2024-10-25 MED ORDER — ACETAMINOPHEN 500 MG PO TABS
ORAL_TABLET | ORAL | Status: AC
  Filled 2024-10-25: qty 2

## 2024-10-25 MED ORDER — CELECOXIB 200 MG PO CAPS
200.0000 mg | ORAL_CAPSULE | Freq: Once | ORAL | Status: AC
  Administered 2024-10-25: 200 mg via ORAL

## 2024-10-25 MED ORDER — MIDAZOLAM HCL 2 MG/2ML IJ SOLN
INTRAMUSCULAR | Status: AC
  Filled 2024-10-25: qty 2

## 2024-10-25 MED ORDER — ONDANSETRON HCL 4 MG/2ML IJ SOLN
INTRAMUSCULAR | Status: AC
  Filled 2024-10-25: qty 2

## 2024-10-25 MED ORDER — OXYCODONE HCL 5 MG/5ML PO SOLN: 5.0000 mg | Freq: Once | ORAL | Status: DC | PRN

## 2024-10-25 MED ORDER — CELECOXIB 200 MG PO CAPS
ORAL_CAPSULE | ORAL | Status: AC
  Filled 2024-10-25: qty 1

## 2024-10-25 MED ORDER — ACETAMINOPHEN 500 MG PO TABS
1000.0000 mg | ORAL_TABLET | Freq: Once | ORAL | Status: AC
  Administered 2024-10-25: 1000 mg via ORAL

## 2024-10-25 MED ORDER — LACTATED RINGERS IV SOLN: INTRAVENOUS | Status: DC

## 2024-10-25 MED ORDER — LIDOCAINE HCL (CARDIAC) PF 100 MG/5ML IV SOSY
PREFILLED_SYRINGE | INTRAVENOUS | Status: DC | PRN
  Administered 2024-10-25: 60 mg via INTRAVENOUS

## 2024-10-25 MED ORDER — MIDAZOLAM HCL (PF) 2 MG/2ML IJ SOLN
INTRAMUSCULAR | Status: DC | PRN
  Administered 2024-10-25: 1 mg via INTRAVENOUS

## 2024-10-25 MED ORDER — ALBUMIN HUMAN 5 % IV SOLN
12.5000 g | Freq: Once | INTRAVENOUS | Status: AC
  Administered 2024-10-25: 12.5 g via INTRAVENOUS

## 2024-10-25 MED ORDER — KETOROLAC TROMETHAMINE 30 MG/ML IJ SOLN
INTRAMUSCULAR | Status: AC
  Filled 2024-10-25: qty 2

## 2024-10-25 MED ORDER — ONDANSETRON HCL 4 MG/2ML IJ SOLN
INTRAMUSCULAR | Status: DC | PRN
  Administered 2024-10-25: 4 mg via INTRAVENOUS

## 2024-10-25 MED ORDER — FENTANYL CITRATE (PF) 100 MCG/2ML IJ SOLN
INTRAMUSCULAR | Status: DC | PRN
  Administered 2024-10-25: 50 ug via INTRAVENOUS

## 2024-10-25 MED ORDER — LIDOCAINE 2% (20 MG/ML) 5 ML SYRINGE
INTRAMUSCULAR | Status: AC
  Filled 2024-10-25: qty 20

## 2024-10-25 MED ORDER — ALBUMIN HUMAN 5 % IV SOLN
INTRAVENOUS | Status: AC
  Filled 2024-10-25: qty 250

## 2024-10-25 MED ORDER — PROPOFOL 10 MG/ML IV BOLUS
INTRAVENOUS | Status: DC | PRN
  Administered 2024-10-25: 150 mg via INTRAVENOUS

## 2024-10-25 MED ORDER — PHENYLEPHRINE HCL (PRESSORS) 10 MG/ML IV SOLN
INTRAVENOUS | Status: DC | PRN
  Administered 2024-10-25: 80 ug via INTRAVENOUS

## 2024-10-25 MED ORDER — SODIUM CHLORIDE 0.9 % IV SOLN: 12.5000 mg | INTRAVENOUS | Status: DC | PRN

## 2024-10-25 MED ORDER — CEFAZOLIN SODIUM-DEXTROSE 2-4 GM/100ML-% IV SOLN
2.0000 g | INTRAVENOUS | Status: AC
  Administered 2024-10-25: 2 g via INTRAVENOUS

## 2024-10-25 MED ORDER — FENTANYL CITRATE (PF) 100 MCG/2ML IJ SOLN
INTRAMUSCULAR | Status: AC
  Filled 2024-10-25: qty 2

## 2024-10-25 MED ORDER — 0.9 % SODIUM CHLORIDE (POUR BTL) OPTIME
TOPICAL | Status: DC | PRN
  Administered 2024-10-25: 300 mL

## 2024-10-25 MED ORDER — LIDOCAINE-EPINEPHRINE (PF) 1 %-1:200000 IJ SOLN
INTRAMUSCULAR | Status: DC | PRN
  Administered 2024-10-25: 10 mL

## 2024-10-25 MED ORDER — DEXAMETHASONE SODIUM PHOSPHATE 4 MG/ML IJ SOLN
INTRAMUSCULAR | Status: DC | PRN
  Administered 2024-10-25: 5 mg via INTRAVENOUS

## 2024-10-25 MED ORDER — ONDANSETRON HCL 4 MG/2ML IJ SOLN
INTRAMUSCULAR | Status: AC
  Filled 2024-10-25: qty 6

## 2024-10-25 MED ORDER — FENTANYL CITRATE (PF) 100 MCG/2ML IJ SOLN: 25.0000 ug | INTRAMUSCULAR | Status: DC | PRN

## 2024-10-25 MED ORDER — CEFAZOLIN SODIUM-DEXTROSE 2-4 GM/100ML-% IV SOLN
INTRAVENOUS | Status: AC
  Filled 2024-10-25: qty 100

## 2024-10-25 MED ORDER — AMISULPRIDE (ANTIEMETIC) 5 MG/2ML IV SOLN: 10.0000 mg | Freq: Once | INTRAVENOUS | Status: DC | PRN

## 2024-10-25 MED ORDER — OXYCODONE HCL 5 MG PO TABS: 5.0000 mg | ORAL_TABLET | Freq: Four times a day (QID) | ORAL | 0 refills | Status: AC | PRN

## 2024-10-25 MED ORDER — OXYCODONE HCL 5 MG PO TABS: 5.0000 mg | ORAL_TABLET | Freq: Once | ORAL | Status: DC | PRN

## 2024-10-25 MED ORDER — KETOROLAC TROMETHAMINE 30 MG/ML IJ SOLN
INTRAMUSCULAR | Status: AC
  Filled 2024-10-25: qty 1

## 2024-10-25 SURGICAL SUPPLY — 34 items
APPLICATOR COTTON TIP 6 STRL (MISCELLANEOUS) ×1 IMPLANT
APPLICATOR DR MATTHEWS STRL (MISCELLANEOUS) ×1 IMPLANT
BLADE SURG 15 STRL LF DISP TIS (BLADE) ×2 IMPLANT
BNDG COHESIVE 4X5 TAN STRL LF (GAUZE/BANDAGES/DRESSINGS) ×1 IMPLANT
BNDG COMPR ESMARK 4X3 LF (GAUZE/BANDAGES/DRESSINGS) ×1 IMPLANT
CHLORAPREP W/TINT 26 (MISCELLANEOUS) ×1 IMPLANT
CORD BIPOLAR FORCEPS 12FT (ELECTRODE) ×1 IMPLANT
COVER BACK TABLE 60X90IN (DRAPES) ×1 IMPLANT
CUFF TOURN SGL QUICK 18X4 (TOURNIQUET CUFF) IMPLANT
DRAPE HAND 77X146 (DRAPES) ×1 IMPLANT
DRAPE SURG 17X23 STRL (DRAPES) ×1 IMPLANT
DRSG AQUACEL AG 3.5X4 (GAUZE/BANDAGES/DRESSINGS) ×1 IMPLANT
DRSG TEGADERM 2-3/8X2-3/4 SM (GAUZE/BANDAGES/DRESSINGS) ×1 IMPLANT
GAUZE SPONGE 2X2 12-PLY NSTRL (GAUZE/BANDAGES/DRESSINGS) ×1 IMPLANT
GAUZE XEROFORM 1X8 LF (GAUZE/BANDAGES/DRESSINGS) ×1 IMPLANT
GLOVE BIO SURGEON STRL SZ7.5 (GLOVE) ×1 IMPLANT
GLOVE BIOGEL PI IND STRL 7.0 (GLOVE) IMPLANT
GLOVE BIOGEL PI IND STRL 7.5 (GLOVE) ×1 IMPLANT
GOWN STRL REUS W/ TWL LRG LVL3 (GOWN DISPOSABLE) ×2 IMPLANT
GOWN STRL SURGICAL XL XLNG (GOWN DISPOSABLE) ×2 IMPLANT
KIT ENDO FORWARD CUT SAFEVIEW (SYSTAGENIX WOUND MANAGEMENT) ×1 IMPLANT
NDL HYPO 25X5/8 SAFETYGLIDE (NEEDLE) IMPLANT
NEEDLE HYPO 25X5/8 SAFETYGLIDE (NEEDLE) IMPLANT
PACK BASIN DAY SURGERY FS (CUSTOM PROCEDURE TRAY) ×1 IMPLANT
SHEET MEDIUM DRAPE 40X70 STRL (DRAPES) ×1 IMPLANT
SOLN 0.9% NACL POUR BTL 1000ML (IV SOLUTION) IMPLANT
SOLUTION ANTFG W/FOAM PAD STRL (MISCELLANEOUS) ×1 IMPLANT
SPIKE FLUID TRANSFER (MISCELLANEOUS) IMPLANT
STOCKINETTE IMPERVIOUS 9X36 MD (GAUZE/BANDAGES/DRESSINGS) ×1 IMPLANT
SUT ETHILON 4 0 PS 2 18 (SUTURE) ×1 IMPLANT
SYR BULB EAR ULCER 3OZ GRN STR (SYRINGE) ×2 IMPLANT
SYR CONTROL 10ML LL (SYRINGE) ×1 IMPLANT
TOWEL GREEN STERILE FF (TOWEL DISPOSABLE) ×2 IMPLANT
UNDERPAD 30X36 HEAVY ABSORB (UNDERPADS AND DIAPERS) ×1 IMPLANT

## 2024-10-25 NOTE — Anesthesia Postprocedure Evaluation (Signed)
"   Anesthesia Post Note  Patient: Toni Miller  Procedure(s) Performed: LEFT WRIST ENDOSCOPIC CARPAL TUNNEL RELEASE (Left: Wrist)     Patient location during evaluation: PACU Anesthesia Type: General Level of consciousness: awake and alert Pain management: pain level controlled Vital Signs Assessment: post-procedure vital signs reviewed and stable Respiratory status: spontaneous breathing, nonlabored ventilation and respiratory function stable Cardiovascular status: stable and blood pressure returned to baseline Anesthetic complications: no   No notable events documented.  Last Vitals:  Vitals:   10/25/24 1428 10/25/24 1434  BP: 115/78 126/82  Pulse: 65 73  Resp: 14 18  Temp:  (!) 36.2 C  SpO2: 96% 97%    Last Pain:  Vitals:   10/25/24 1434  TempSrc:   PainSc: 3                  Debby FORBES Like      "

## 2024-10-25 NOTE — Transfer of Care (Signed)
 Immediate Anesthesia Transfer of Care Note  Patient: Toni Miller  Procedure(s) Performed: LEFT WRIST ENDOSCOPIC CARPAL TUNNEL RELEASE (Left: Wrist)  Patient Location: PACU  Anesthesia Type:General  Level of Consciousness: drowsy and patient cooperative  Airway & Oxygen Therapy: Patient Spontanous Breathing and Patient connected to nasal cannula oxygen  Post-op Assessment: Report given to RN and Post -op Vital signs reviewed and stable  Post vital signs: Reviewed and stable  Last Vitals:  Vitals Value Taken Time  BP 108/74 10/25/24 13:19  Temp    Pulse 80 10/25/24 13:19  Resp 19 10/25/24 13:19  SpO2 100 % 10/25/24 13:19  Vitals shown include unfiled device data.  Last Pain:  Vitals:   10/25/24 1138  TempSrc: Temporal  PainSc: 0-No pain         Complications: No notable events documented.

## 2024-10-25 NOTE — Anesthesia Procedure Notes (Signed)
 Procedure Name: LMA Insertion Date/Time: 10/25/2024 12:36 PM  Performed by: Donnell Berwyn SQUIBB, CRNAPre-anesthesia Checklist: Patient identified, Emergency Drugs available, Suction available, Patient being monitored and Timeout performed Patient Re-evaluated:Patient Re-evaluated prior to induction Oxygen Delivery Method: Circle system utilized Preoxygenation: Pre-oxygenation with 100% oxygen Induction Type: IV induction Ventilation: Mask ventilation without difficulty LMA: LMA inserted LMA Size: 3.0 Number of attempts: 1 Placement Confirmation: positive ETCO2 and breath sounds checked- equal and bilateral Tube secured with: Tape Dental Injury: Teeth and Oropharynx as per pre-operative assessment

## 2024-10-25 NOTE — H&P (Signed)
 @LOGODEPT @  Toni Miller - 63 y.o. female MRN [id number]  Date of birth: [birth year]   HAND SURGERY H&P UPDATE   HPI: Patient is a 63 y.o. female who presents with left carpal tunnel syndrome, here today for left endoscopic carpal tunnel release.  Patient denies any changes to their medical history or new systemic symptoms today.    Past Medical History:  Diagnosis Date   Anxiety    Arthritis    Cancer (HCC) 1993   abnormal pap treated at [facility]   Constipation    Depression    GERD (gastroesophageal reflux disease)    Glaucoma 1993   History of kidney stones    Hot flashes, menopausal 06/12/2011   Hyperlipidemia    Hypertension    no meds now   Neck pain 05/2008   WITH BULGING DISC-----RECIEVING EPIDURALS    Pinched nerve    back   Sinusitis    Past Surgical History:  Procedure Laterality Date   ABDOMINAL EXPLORATION SURGERY  age 49   bowel obstruction, [facility]   ABDOMINAL HYSTERECTOMY  2007   [facility], [facility]   BILATERAL EYE SURGERY     for glaucoma   BIOPSY  01/09/2019   Procedure: BIOPSY;  Surgeon: Harvey Margo CROME, MD;  Location: [facility];  Service: Endoscopy;;  gstric    bone removed from under tongue     BREAST BIOPSY Left    benign   BREAST CYST EXCISION Left 2004   BREAST SURGERY Left 2004   left partial mastectomy, [facility]   CATARACT EXTRACTION Right 06/04/2015   CATARACT EXTRACTION W/PHACO  05/16/2011   Procedure: CATARACT EXTRACTION PHACO AND INTRAOCULAR LENS PLACEMENT (IOC);  Surgeon: Cherene Mania;  Location: [facility] ORS;  Service: Ophthalmology;  Laterality: Right;   COLONOSCOPY N/A 05/17/2013   Dr. Sharla diverticulosis was noted/small internal hemorrhoids   COLONOSCOPY WITH PROPOFOL  N/A 04/12/2021   Colonoscopy June 2022: small anal sphincter s/p rectal surgery, internal hemorrhoids, one 2 mm polyp in cecum (benign). 10 year surveillance.   CYST REMOVED LEFT BREAST /BENIGN  2005   left, [facility]   ESOPHAGOGASTRODUODENOSCOPY N/A 01/09/2019   normal esophagus.  Mild NSAID gastritis s/p biopsy. +H.pylori. treated with amoxicillin , biaxin , Nexium .. ERADICATION DOCUMENTED   EYE SURGERY  2010, 1992 approx   bilateral   POLYPECTOMY  04/12/2021   Procedure: POLYPECTOMY;  Surgeon: Cindie Carlin POUR, DO;  Location: [facility];  Service: Endoscopy;;   REPAIR IMPERFORATE ANUS / ANORECTOPLASTY     per patient   TOTAL ABDOMINAL HYSTERECTOMY W/ BILATERAL SALPINGOOPHORECTOMY  2007   [facility], [facility]   TUBAL LIGATION  1991   Social History   Socioeconomic History   Marital status: Significant Other    Spouse name: Not on file   Number of children: 3   Years of education: 70   Highest education level: 12th grade  Occupational History   Occupation: UNEMPLOYED 2011    Comment: disability    Employer: DISABLED  Tobacco Use   Smoking status: Former    Current packs/day: 0.00    Average packs/day: 3.0 packs/day for 2.0 years (6.0 ttl pk-yrs)    Types: Cigarettes    Start date: 05/11/1989    Quit date: 05/12/1991    Years since quitting: 33.4   Smokeless tobacco: Never  Vaping Use   Vaping status: Never Used  Substance and Sexual Activity   Alcohol use: Not Currently   Drug use: Never   Sexual activity: Yes    Birth control/protection: Surgical  Other Topics Concern   Not on file  Social History Narrative   Lives with son, Elspeth. 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   caffien- coffee, 1 cup daily   Social Drivers of Health   Tobacco Use: Medium Risk (10/25/2024)   Patient History    Smoking Tobacco Use: Former    Smokeless Tobacco Use: Never    Passive Exposure: Not on file  Financial Resource Strain: Low Risk (10/25/2023)   Overall Financial Resource Strain (CARDIA)    Difficulty of Paying Living Expenses: Not hard at all  Food Insecurity: No Food Insecurity (04/16/2024)   Hunger Vital Sign    Worried About Running Out of Food in the Last Year: Never true    Ran Out of Food in the Last Year: Never true  Transportation Needs: No Transportation Needs (04/16/2024)    PRAPARE - Administrator, Civil Service (Medical): No    Lack of Transportation (Non-Medical): No  Physical Activity: Insufficiently Active (10/25/2023)   Exercise Vital Sign    Days of Exercise per Week: 3 days    Minutes of Exercise per Session: 20 min  Stress: Stress Concern Present (10/25/2023)   [employer] of Occupational Health - Occupational Stress Questionnaire    Feeling of Stress : To some extent  Social Connections: Moderately Isolated (10/25/2023)   Social Connection and Isolation Panel    Frequency of Communication with Friends and Family: Three times a week    Frequency of Social Gatherings with Friends and Family: Never    Attends Religious Services: More than 4 times per year    Active Member of Clubs or Organizations: No    Attends Banker Meetings: Never    Marital Status: Divorced  Depression (PHQ2-9): Low Risk (09/12/2024)   Depression (PHQ2-9)    PHQ-2 Score: 0  Alcohol Screen: Low Risk (10/25/2023)   Alcohol Screen    Last Alcohol Screening Score (AUDIT): 0  Housing: Low Risk (04/16/2024)   Housing Stability Vital Sign    Unable to Pay for Housing in the Last Year: No    Number of Times Moved in the Last Year: 0    Homeless in the Last Year: No  Utilities: Not At Risk (04/16/2024)   [facility] Utilities    Threatened with loss of utilities: No  Health Literacy: Patient Declined (10/25/2023)   B1300 Health Literacy    Frequency of need for help with medical instructions: Patient declines to respond   Family History  Adopted: Yes  Problem Relation Age of Onset   Alcohol abuse Mother    Stomach cancer Mother 76   Lung cancer Father    Glaucoma Father    Alcohol abuse Father    Seizures Father    Breast cancer Sister 2   Breast cancer Sister 19   Heart disease Sister    Heart attack Sister    Glaucoma Son    Glaucoma Maternal Grandfather    Cancer Maternal Grandfather        poss leukemia   SIDS Brother     Hyperthyroidism Daughter    Seizures Son        as a development worker, international aid   Kidney disease Maternal Grandmother    Heart disease Maternal Grandmother        visual merchandiser   Leukemia Maternal Grandmother    Colon cancer Neg Hx    - negative except otherwise stated in the family history section Allergies[1] Prior to Admission medications  Medication Sig Start Date  End Date Taking? Authorizing Provider  cholecalciferol (VITAMIN D3) 25 MCG (1000 UNIT) tablet Take 1,000 Units by mouth daily.   Yes [provider]  cycloSPORINE  (RESTASIS ) 0.05 % ophthalmic emulsion Place 1 drop into both eyes 2 (two) times daily.   Yes [provider]  fluticasone  (FLONASE ) 50 MCG/ACT nasal spray Place 1 spray into both nostrils daily as needed for allergies or rhinitis. 03/15/23  Yes Antonetta Rollene BRAVO, MD  gabapentin  (NEURONTIN ) 300 MG capsule Take 1 capsule (300 mg total) by mouth 2 (two) times daily. 09/12/24  Yes Antonetta Rollene BRAVO, MD  meloxicam  (MOBIC ) 7.5 MG tablet Take 1 tablet (7.5 mg total) by mouth daily as needed for pain. 04/25/24  Yes Bevely Doffing, FNP  montelukast  (SINGULAIR ) 10 MG tablet Take 1 tablet (10 mg total) by mouth daily. 04/24/24  Yes Antonetta Rollene BRAVO, MD  Multiple Vitamin (MULTIVITAMIN) capsule Take 1 capsule by mouth daily.   Yes [provider]  pantoprazole  (PROTONIX ) 40 MG tablet Take 1 tablet (40 mg total) by mouth daily. 30 minutes before breakfast 07/31/24  Yes Shirlean Therisa ORN, NP  PREMARIN  vaginal cream APPLY FINGERTIP AMOUNT VAGINALLY 3 TIMES WEEKLY AS NEEDED 04/05/24  Yes Antonetta Rollene BRAVO, MD  rosuvastatin  (CRESTOR ) 10 MG tablet Take 1 tablet (10 mg total) by mouth at bedtime. 04/24/24  Yes Antonetta Rollene BRAVO, MD  saccharomyces boulardii (FLORASTOR) 250 MG capsule Take 1 capsule (250 mg total) by mouth 2 (two) times daily. 07/31/24  Yes Shirlean Therisa ORN, NP  TRAVATAN  Z 0.004 % SOLN ophthalmic solution Place 1 drop into both eyes at bedtime. 03/29/24  Yes Antonetta Rollene BRAVO, MD  venlafaxine  XR (EFFEXOR -XR) 75 MG 24 hr capsule Take 1 capsule (75 mg total) by mouth daily with breakfast. 09/02/24 12/31/24 Yes Antonetta Rollene BRAVO, MD  triamcinolone  cream (KENALOG ) 0.1 % Apply 1 Application topically 2 (two) times daily. 10/02/24   Leath-Warren, Etta PARAS, NP   No results found. - Positive ROS: All other systems have been reviewed and were otherwise negative with the exception of those mentioned in the HPI and as above.  Physical Exam:  General: Patient is well appearing and in no distress.    Skin and Muscle: No skin changes are apparent to upper extremities.    Range of Motion and Palpation Tests: Mobility is full about the elbows with flexion and extension. Forearm supination and pronation are 85/85 left side, 75/70 right.  Wrist flexion/extension is 75/65 left side, right side 55/45. Digital flexion and extension are full.  Thumb opposition is full to the base of the small fingers bilaterally.     No cords or nodules are palpated.  No triggering is observed.     Neurologic, Vascular, Motor: Sensation is slightly diminished to light touch in the left median nerve distribution.    Thenar atrophy: Negative left Tinel sign: Positive left carpal tunnel Carpal tunnel compression: Positive left Phalen test: Positive left     Motor left hand FPL: 5/5 Index FDP: 5/5 APB: 5/5   Fingers pink and well perfused.  Capillary refill is brisk.     Assessment/Plan: OR today for left endoscopic carpal tunnel release. We again reviewed the risks of surgery which include bleeding, infection, damage to neurovascular structures, persistent symptoms, need for additional surgery, possible conversion to open release.  Informed consent was signed.  All questions were answered.   Renatta Shrieves [facility], Hand Surgery     [1]  Allergies Allergen Reactions   Acyclovir  And Related  Rash

## 2024-10-25 NOTE — Anesthesia Preprocedure Evaluation (Addendum)
"                                    Anesthesia Evaluation  Patient identified by MRN, date of birth, ID band Patient awake    Reviewed: Allergy & Precautions, NPO status , Patient's Chart, lab work & pertinent test results  History of Anesthesia Complications Negative for: history of anesthetic complications  Airway Mallampati: II  TM Distance: >3 FB Neck ROM: Full    Dental  (+) Dental Advisory Given, Caps   Pulmonary asthma , former smoker   Pulmonary exam normal        Cardiovascular hypertension (no meds), Normal cardiovascular exam     Neuro/Psych  Headaches PSYCHIATRIC DISORDERS Anxiety Depression       GI/Hepatic Neg liver ROS,GERD  Medicated and Controlled,,  Endo/Other  negative endocrine ROS    Renal/GU negative Renal ROS     Musculoskeletal  (+) Arthritis ,    Abdominal   Peds  Hematology negative hematology ROS (+)   Anesthesia Other Findings   Reproductive/Obstetrics                              Anesthesia Physical Anesthesia Plan  ASA: 2  Anesthesia Plan: General   Post-op Pain Management: Tylenol  PO (pre-op)* and Celebrex  PO (pre-op)*   Induction: Intravenous  PONV Risk Score and Plan: 3 and Treatment may vary due to age or medical condition, Ondansetron , Dexamethasone  and Midazolam   Airway Management Planned: LMA  Additional Equipment: None  Intra-op Plan:   Post-operative Plan: Extubation in OR  Informed Consent: I have reviewed the patients History and Physical, chart, labs and discussed the procedure including the risks, benefits and alternatives for the proposed anesthesia with the patient or authorized representative who has indicated his/her understanding and acceptance.     Dental advisory given  Plan Discussed with: CRNA and Anesthesiologist  Anesthesia Plan Comments:          Anesthesia Quick Evaluation  "

## 2024-10-25 NOTE — Discharge Instructions (Addendum)
" °  Post Anesthesia Home Care Instructions  Activity: Get plenty of rest for the remainder of the day. A responsible individual must stay with you for 24 hours following the procedure.  For the next 24 hours, DO NOT: -Drive a car -Advertising copywriter -Drink alcoholic beverages -Take any medication unless instructed by your physician -Make any legal decisions or sign important papers.  Meals: Start with liquid foods such as gelatin or soup. Progress to regular foods as tolerated. Avoid greasy, spicy, heavy foods. If nausea and/or vomiting occur, drink only clear liquids until the nausea and/or vomiting subsides. Call your physician if vomiting continues.  Special Instructions/Symptoms: Your throat may feel dry or sore from the anesthesia or the breathing tube placed in your throat during surgery. If this causes discomfort, gargle with warm salt water . The discomfort should disappear within 24 hours.  If you had a scopolamine patch placed behind your ear for the management of post- operative nausea and/or vomiting:  1. The medication in the patch is effective for 72 hours, after which it should be removed.  Wrap patch in a tissue and discard in the trash. Wash hands thoroughly with soap and water . 2. You may remove the patch earlier than 72 hours if you experience unpleasant side effects which may include dry mouth, dizziness or visual disturbances. 3. Avoid touching the patch. Wash your hands with soap and water  after contact with the patch.  NO TYLENOL  UNTIL AFTER 4PM         Hand Surgery Postop Instructions    Dressings: Maintain postoperative dressing for 5 days.   After 5 days, it is okay to unwrap postoperative dressing and apply Band-Aid or rewrap.   Keep operative site clean and dry until orthopedic follow-up.  Wound Care: Keep your hand elevated above the level of your heart.  Do not allow it to dangle by your side. Moving your fingers is advised to stimulate circulation  but will depend on the site of your surgery.  If you have a splint applied, your doctor will advise you regarding movement.  Activity: Do not drive or operate machinery until clearance given from physician. No heavy lifting with operative extremity.  Diet:  Drink liquids today or eat a light diet.  You may resume a regular diet tomorrow.    General expectations: Pain for two to three days. Take prescribed medication if given, transition to over-the-counter medication as quickly as possible. Fingers may become slightly swollen.  Call your doctor if any of the following occur: Severe pain not relieved by pain medication. Elevated temperature. Dressing soaked with blood. Inability to move fingers. White or bluish color to fingers.   Per [facility] clinic policy, our goal is ensure optimal postoperative pain control with a multimodal pain management strategy. For all [facility] patients, our goal is to wean post-operative narcotic medications by 6 weeks post-operatively. If this is not possible due to utilization of pain medication prior to surgery, your [facility] doctor will support your acute post-operative pain control for the first 6 weeks postoperatively, with a plan to transition you back to your primary pain team following that. Maralee will work to ensure a therapist, occupational.  Anshul Afton Alderton, M.D. Hand Surgery [facility]  "

## 2024-10-25 NOTE — Op Note (Signed)
 NAME: Toni Miller MEDICAL RECORD NO: [id number] DATE OF BIRTH: Jan 14, 1961 FACILITY: [facility] LOCATION: [facility] PHYSICIAN: GILDARDO ALDERTON, MD   OPERATIVE REPORT   DATE OF PROCEDURE: 10/25/2024    PREOPERATIVE DIAGNOSIS: Left carpal tunnel syndrome   POSTOPERATIVE DIAGNOSIS: Left carpal tunnel syndrome   PROCEDURE: Left endoscopic carpal tunnel release   SURGEON:  Gildardo Alderton, M.D.   ASSISTANT: Joesph Hooks, OPA   ANESTHESIA:  General   INTRAVENOUS FLUIDS:  Per anesthesia flow sheet.   ESTIMATED BLOOD LOSS:  Minimal.   COMPLICATIONS:  None.   SPECIMENS:  none   TOURNIQUET TIME:    Total Tourniquet Time Documented: Upper Arm (Left) - 6 minutes Total: Upper Arm (Left) - 6 minutes    DISPOSITION:  Stable to PACU.   INDICATIONS: 63 year old female who was seen in the outpatient setting and found to have clinical signs of left-sided carpal tunnel syndrome refractory to conservative care.  Patient was indicated for open versus endoscopic carpal tunnel release.  After discussion, patient elected to have surgery in the form of left endoscopic carpal tunnel release.  Risks and benefits of surgery were discussed including the risks of infection, bleeding, scarring, stiffness, nerve injury, vascular injury, tendon injury, need for subsequent operation, persistent symptoms, need for conversion to open surgery, recurrence.  She voiced understanding of these risks and elected to proceed.  OPERATIVE COURSE: Patient was seen and identified in the preoperative area and marked appropriately.  Surgical consent had been signed. Preoperative IV antibiotic prophylaxis was given. She was transferred to the operating room and placed in supine position with the Left upper extremity on an arm board.  General anesthesia was induced by the anesthesiologist.  Left upper extremity was prepped and draped in normal sterile orthopedic fashion.  A surgical pause was performed  between the surgeons, anesthesia, and operating room staff and all were in agreement as to the patient, procedure, and site of procedure.  Tourniquet was placed and padded appropriately to the left upper arm.  The arm was exsanguinated and the tourniquet was inflated to 2 and 50 mmHg.  A 1.5 cm skin incision was designed transversely proximal to the wrist flexion crease.  This incision was carried down through the subcutaneous tissues and through the forearm fascia.  A synovial elevator was introduced to identify the carpal tunnel space.  Sequential dilators were then used to open the carpal tunnel.  The cannula from the SafeView system was introduced in antegrade fashion into the carpal tunnel, and a standard wrist endoscope was used to visualize the undersurface of the transverse carpal ligament.  A probe and a rasp were used to delineate the distal edge of the transverse carpal ligament and to clear the underlying synovial tissues.  At this juncture, a forward cutting blade was introduced in an antegrade fashion was used to divide the transverse carpal ligament in its entirety.  Care was taken to ensure complete division of the structure by probing the resultant defect.  The endoscopic instruments were then removed.  At this point in the procedure, the median nerve was identified at the wrist flexion crease.  The distal end of the forearm fascia was released using tenotomy scissors with particular care taken to avoid injury to the palmar cutaneous branch of the median nerve.  The median nerve appeared completely decompressed in both the palm and distal forearm.  The wound was copiously irrigated, the tourniquet was deflated and hemostasis was achieved with bipolar electrocautery.  Tourniquet time was  6 minutes.  The wound was closed with 4-0 nylon suture in vertical mattress fashion.  Sterile dressings were applied.  Patient was subsequently awoken from anesthesia and transported to the postoperative unit  in stable condition.   Post-operative plan: The patient will recover in the post-anesthesia care unit and then be discharged home.  The patient will be non weight bearing on the left upper extremity in a soft dressing.   I will see the patient back in the office in 2 weeks for postoperative followup.    Brennah Quraishi, MD Electronically signed, 10/25/2024

## 2024-10-26 ENCOUNTER — Encounter (HOSPITAL_BASED_OUTPATIENT_CLINIC_OR_DEPARTMENT_OTHER): Payer: Self-pay | Admitting: Orthopedic Surgery

## 2024-10-28 ENCOUNTER — Ambulatory Visit (INDEPENDENT_AMBULATORY_CARE_PROVIDER_SITE_OTHER): Payer: 59

## 2024-10-28 VITALS — Ht 61.0 in | Wt 116.0 lb

## 2024-10-28 DIAGNOSIS — Z0000 Encounter for general adult medical examination without abnormal findings: Secondary | ICD-10-CM | POA: Diagnosis not present

## 2024-10-28 NOTE — Progress Notes (Signed)
 "  Chief Complaint  Patient presents with   Medicare Wellness     Subjective:   Toni Miller is a 63 y.o. female who presents for a Medicare Annual Wellness Visit.  Visit info / Clinical Intake: Medicare Wellness Visit Type:: Subsequent Annual Wellness Visit Persons participating in visit and providing information:: patient Medicare Wellness Visit Mode:: Video Since this visit was completed virtually, some vitals may be partially provided or unavailable. Missing vitals are due to the limitations of the virtual format.: Documented vitals are patient reported If Telephone or Video please confirm:: I connected with patient using audio/video enable telemedicine. I verified patient identity with two identifiers, discussed telehealth limitations, and patient agreed to proceed. Patient Location:: home Provider Location:: office Interpreter Needed?: No Pre-visit prep was completed: yes AWV questionnaire completed by patient prior to visit?: no Living arrangements:: with family/others Patient's Overall Health Status Rating: very good Typical amount of pain: some (patient had a carpal tunnel release surgery on LT hand on 10/25/2024 which is cause of pain) Does pain affect daily life?: no Are you currently prescribed opioids?: (!) yes (due to CTR LT on 10/25/2024)  Dietary Habits and Nutritional Risks How many meals a day?: 2 Eats fruit and vegetables daily?: yes Most meals are obtained by: preparing own meals In the last 2 weeks, have you had any of the following?: none Diabetic:: no  Functional Status Activities of Daily Living (to include ambulation/medication): Independent Ambulation: Independent Home Management (perform basic housework or laundry): Independent Manage your own finances?: yes Primary transportation is: family / friends Concerns about vision?: no *vision screening is required for WTM* Concerns about hearing?: no  Fall Screening Falls in the past year?: 0 Number  of falls in past year: 0 Was there an injury with Fall?: 0 Fall Risk Category Calculator: 0 Patient Fall Risk Level: Low Fall Risk  Fall Risk Patient at Risk for Falls Due to: History of fall(s) Fall risk Follow up: Falls evaluation completed; Falls prevention discussed; Education provided  Home and Transportation Safety: All rugs have non-skid backing?: (!) no (discussed non skid backing as well as carpet/rug tape to use to prevent carpets from slipping. patient verbalized understanding.) All stairs or steps have railings?: N/A, no stairs Grab bars in the bathtub or shower?: yes Have non-skid surface in bathtub or shower?: yes Good home lighting?: yes Regular seat belt use?: yes Hospital stays in the last year:: (!) no; yes How many hospital stays:: 1 Reason: acute kidney injury  Cognitive Assessment Difficulty concentrating, remembering, or making decisions? : no Will 6CIT or Mini Cog be Completed: yes What year is it?: 0 points What month is it?: 0 points Give patient an address phrase to remember (5 components): [address] [place] About what time is it?: 0 points Count backwards from 20 to 1: 0 points Say the months of the year in reverse: 0 points Repeat the address phrase from earlier: 0 points 6 CIT Score: 0 points  Advance Directives (For Healthcare) Does Patient Have a Medical Advance Directive?: No Would patient like information on creating a medical advance directive?: No - Patient declined  Reviewed/Updated  Reviewed/Updated: Reviewed All (Medical, Surgical, Family, Medications, Allergies, Care Teams, Patient Goals)    Allergies (verified) Acyclovir  and related   Current Medications (verified) Outpatient Encounter Medications as of 10/28/2024  Medication Sig   cholecalciferol (VITAMIN D3) 25 MCG (1000 UNIT) tablet Take 1,000 Units by mouth daily.   cycloSPORINE  (RESTASIS ) 0.05 % ophthalmic emulsion Place 1  drop into both eyes 2 (two) times daily.    fluticasone  (FLONASE ) 50 MCG/ACT nasal spray Place 1 spray into both nostrils daily as needed for allergies or rhinitis.   gabapentin  (NEURONTIN ) 300 MG capsule Take 1 capsule (300 mg total) by mouth 2 (two) times daily.   meloxicam  (MOBIC ) 7.5 MG tablet Take 1 tablet (7.5 mg total) by mouth daily as needed for pain.   montelukast  (SINGULAIR ) 10 MG tablet Take 1 tablet (10 mg total) by mouth daily.   Multiple Vitamin (MULTIVITAMIN) capsule Take 1 capsule by mouth daily.   pantoprazole  (PROTONIX ) 40 MG tablet Take 1 tablet (40 mg total) by mouth daily. 30 minutes before breakfast   PREMARIN  vaginal cream APPLY FINGERTIP AMOUNT VAGINALLY 3 TIMES WEEKLY AS NEEDED   rosuvastatin  (CRESTOR ) 10 MG tablet Take 1 tablet (10 mg total) by mouth at bedtime.   saccharomyces boulardii (FLORASTOR) 250 MG capsule Take 1 capsule (250 mg total) by mouth 2 (two) times daily.   TRAVATAN  Z 0.004 % SOLN ophthalmic solution Place 1 drop into both eyes at bedtime.   triamcinolone  cream (KENALOG ) 0.1 % Apply 1 Application topically 2 (two) times daily.   venlafaxine  XR (EFFEXOR -XR) 75 MG 24 hr capsule Take 1 capsule (75 mg total) by mouth daily with breakfast.   oxyCODONE  (ROXICODONE ) 5 MG immediate release tablet Take 1 tablet (5 mg total) by mouth every 6 (six) hours as needed. (Patient not taking: Reported on 10/28/2024)   No facility-administered encounter medications on file as of 10/28/2024.    History: Past Medical History:  Diagnosis Date   Anxiety    Arthritis    Cancer (HCC) 1993   abnormal pap [facility] at [facility]   Constipation    Depression    GERD (gastroesophageal reflux disease)    Glaucoma 1993   History of kidney stones    Hot flashes, menopausal 06/12/2011   Hyperlipidemia    Hypertension    no meds now   Neck pain 05/2008   WITH BULGING DISC-----RECIEVING EPIDURALS    Pinched nerve    back   Sinusitis    Past Surgical History:  Procedure Laterality Date   ABDOMINAL EXPLORATION SURGERY   age 54   bowel obstruction, [facility]   ABDOMINAL HYSTERECTOMY  2007   [facility], [facility]   BILATERAL EYE SURGERY     for glaucoma   BIOPSY  01/09/2019   Procedure: BIOPSY;  Surgeon: Harvey Margo CROME, MD;  Location: [facility];  Service: Endoscopy;;  gstric    bone removed from under tongue     BREAST BIOPSY Left    benign   BREAST CYST EXCISION Left 2004   BREAST SURGERY Left 2004   left partial mastectomy, [facility]   CARPAL TUNNEL RELEASE Left 10/25/2024   Procedure: LEFT WRIST ENDOSCOPIC CARPAL TUNNEL RELEASE;  Surgeon: Arlinda Buster, MD;  Location: [facility];  Service: Orthopedics;  Laterality: Left;   CATARACT EXTRACTION Right 06/04/2015   CATARACT EXTRACTION W/PHACO  05/16/2011   Procedure: CATARACT EXTRACTION PHACO AND INTRAOCULAR LENS PLACEMENT (IOC);  Surgeon: Cherene Mania;  Location: [facility];  Service: Ophthalmology;  Laterality: Right;   COLONOSCOPY N/A 05/17/2013   Dr. Sharla diverticulosis was noted/small internal hemorrhoids   COLONOSCOPY WITH PROPOFOL  N/A 04/12/2021   Colonoscopy June 2022: small anal sphincter s/p rectal surgery, internal hemorrhoids, one 2 mm polyp in cecum (benign). 10 year surveillance.   CYST REMOVED LEFT BREAST /BENIGN  2005   left, [facility]   ESOPHAGOGASTRODUODENOSCOPY N/A 01/09/2019  normal esophagus. Mild NSAID gastritis s/p biopsy. +H.pylori. treated with amoxicillin , biaxin , Nexium .. ERADICATION DOCUMENTED   EYE SURGERY  2010, 1992 approx   bilateral   POLYPECTOMY  04/12/2021   Procedure: POLYPECTOMY;  Surgeon: Cindie Carlin POUR, DO;  Location: [facility];  Service: Endoscopy;;   REPAIR IMPERFORATE ANUS / ANORECTOPLASTY     per patient   TOTAL ABDOMINAL HYSTERECTOMY W/ BILATERAL SALPINGOOPHORECTOMY  2007   [facility], [facility]   TUBAL LIGATION  1991   Family History  Adopted: Yes  Problem Relation Age of Onset   Alcohol abuse Mother    Stomach cancer Mother 71   Lung cancer Father    Glaucoma Father    Alcohol abuse Father    Seizures  Father    Breast cancer Sister 87   Breast cancer Sister 57   Heart disease Sister    Heart attack Sister    Glaucoma Son    Glaucoma Maternal Grandfather    Cancer Maternal Grandfather        poss leukemia   SIDS Brother    Hyperthyroidism Daughter    Seizures Son        as a development worker, international aid   Kidney disease Maternal Grandmother    Heart disease Maternal Grandmother        pace maker   Leukemia Maternal Grandmother    Colon cancer Neg Hx    Social History   Occupational History   Occupation: UNEMPLOYED 2011    Comment: disability    Employer: DISABLED  Tobacco Use   Smoking status: Former    Current packs/day: 0.00    Average packs/day: 3.0 packs/day for 2.0 years (6.0 ttl pk-yrs)    Types: Cigarettes    Start date: 05/11/1989    Quit date: 05/12/1991    Years since quitting: 33.4   Smokeless tobacco: Never  Vaping Use   Vaping status: Never Used  Substance and Sexual Activity   Alcohol use: Not Currently   Drug use: Never   Sexual activity: Yes    Birth control/protection: Surgical   Tobacco Counseling Counseling given: Yes  SDOH Screenings   Food Insecurity: No Food Insecurity (10/28/2024)  Housing: Low Risk (10/28/2024)  Transportation Needs: No Transportation Needs (10/28/2024)  Utilities: Not At Risk (10/28/2024)  Alcohol Screen: Low Risk (10/25/2023)  Depression (PHQ2-9): Medium Risk (10/28/2024)  Financial Resource Strain: Low Risk (10/25/2023)  Physical Activity: Patient Declined (10/28/2024)  Social Connections: Moderately Isolated (10/28/2024)  Stress: No Stress Concern Present (10/28/2024)  Tobacco Use: Medium Risk (10/28/2024)  Health Literacy: Adequate Health Literacy (10/28/2024)   See flowsheets for full screening details  Depression Screen PHQ 2 & 9 Depression Scale- Over the past 2 weeks, how often have you been bothered by any of the following problems? Little interest or pleasure in doing things: 0 Feeling down, depressed, or hopeless (PHQ  Adolescent also includes...irritable): 2 PHQ-2 Total Score: 2 Trouble falling or staying asleep, or sleeping too much: 2 Feeling tired or having little energy: 2 Poor appetite or overeating (PHQ Adolescent also includes...weight loss): 1 Feeling bad about yourself - or that you are a failure or have let yourself or your family down: 1 Trouble concentrating on things, such as reading the newspaper or watching television (PHQ Adolescent also includes...like school work): 0 Moving or speaking so slowly that other people could have noticed. Or the opposite - being so fidgety or restless that you have been moving around a lot more than usual: 0 Thoughts that you would be  better off dead, or of hurting yourself in some way: 0 PHQ-9 Total Score: 8 If you checked off any problems, how difficult have these problems made it for you to do your work, take care of things at home, or get along with other people?: Not difficult at all  Depression Treatment Depression Interventions/Treatment : Currently on Treatment     Goals Addressed               This Visit's Progress     I want to try and eat less (pt-stated)               Objective:    Today's Vitals   10/28/24 0846  Weight: 116 lb (52.6 kg)  Height: 5' 1 (1.549 m)   Body mass index is 21.92 kg/m.  Hearing/Vision screen Hearing Screening - Comments:: Patient denies any hearing difficulties.   Vision Screening - Comments:: Wears rx glasses - up to date with routine eye exams with  Oneil Kawasaki + Dr. Harrie Immunizations and Health Maintenance Health Maintenance  Topic Date Due   Medicare Annual Wellness (AWV)  10/24/2024   DTaP/Tdap/Td (3 - Td or Tdap) 02/23/2025   Mammogram  08/05/2025   Colonoscopy  04/12/2026   Pneumococcal Vaccine: 50+ Years  Completed   Influenza Vaccine  Completed   COVID-19 Vaccine  Completed   Hepatitis C Screening  Completed   HIV Screening  Completed   Zoster Vaccines- Shingrix  Completed    Hepatitis B Vaccines 19-59 Average Risk  Aged Out   HPV VACCINES  Aged Out   Meningococcal B Vaccine  Aged Out        Assessment/Plan:  This is a routine wellness examination for Shenekia.  Patient Care Team: Antonetta Rollene BRAVO, MD as PCP - General Jayne Vonn DEL, MD as Consulting Physician (Obstetrics and Gynecology) Shirlean Therisa ORN, NP (Gastroenterology) Vickey Mettle, MD as Consulting Physician (Psychiatry) Kawasaki Oneil, DO (Optometry) Cindie Carlin POUR, DO as Consulting Physician (Gastroenterology) Mallipeddi, Diannah SQUIBB, MD as Consulting Physician (Cardiology)  I have personally reviewed and noted the following in the patients chart:   Medical and social history Use of alcohol, tobacco or illicit drugs  Current medications and supplements including opioid prescriptions. Functional ability and status Nutritional status Physical activity Advanced directives List of other physicians Hospitalizations, surgeries, and ER visits in previous 12 months Vitals Screenings to include cognitive, depression, and falls Referrals and appointments  No orders of the defined types were placed in this encounter.  In addition, I have reviewed and discussed with patient certain preventive protocols, quality metrics, and best practice recommendations. A written personalized care plan for preventive services as well as general preventive health recommendations were provided to patient.   Junia Nygren, CMA   10/28/2024   Return on Wednesday October 29, 2025 at 8:00 am, for your yearly Medicare Wellness Visit in person.  After Visit Summary: (MyChart) Due to this being a telephonic visit, the after visit summary with patients personalized plan was offered to patient via MyChart   Nurse Notes: No concerns  "

## 2024-10-28 NOTE — Patient Instructions (Addendum)
 Toni Miller,  Thank you for taking the time for your Medicare Wellness Visit. I appreciate your continued commitment to your health goals. Please review the care plan we discussed, and feel free to reach out if I can assist you further.  Please note that Annual Wellness Visits do not include a physical exam. Some assessments may be limited, especially if the visit was conducted virtually. If needed, we may recommend an in-person follow-up with your provider.  Ongoing Care Seeing your primary care provider every 3 to 6 months helps us  monitor your health and provide consistent, personalized care.   1 year follow up for Medicare well visit: Wednesday October 29, 2025 at 8:00 am with medicare wellness nurse in office  Recommended Screenings:  Health Maintenance  Topic Date Due   Medicare Annual Wellness Visit  10/24/2024   DTaP/Tdap/Td vaccine (3 - Td or Tdap) 02/23/2025   Breast Cancer Screening  08/05/2025   Colon Cancer Screening  04/12/2026   Pneumococcal Vaccine for age over 38  Completed   Flu Shot  Completed   COVID-19 Vaccine  Completed   Hepatitis C Screening  Completed   HIV Screening  Completed   Zoster (Shingles) Vaccine  Completed   Hepatitis B Vaccine  Aged Out   HPV Vaccine  Aged Out   Meningitis B Vaccine  Aged Out       10/28/2024    8:49 AM  Advanced Directives  Does Patient Have a Medical Advance Directive? No  Would patient like information on creating a medical advance directive? No - Patient declined    Vision: Annual vision screenings are recommended for early detection of glaucoma, cataracts, and diabetic retinopathy. These exams can also reveal signs of chronic conditions such as diabetes and high blood pressure.  Dental: Annual dental screenings help detect early signs of oral cancer, gum disease, and other conditions linked to overall health, including heart disease and diabetes.  Please see the attached documents for additional preventive care  recommendations.

## 2024-10-29 ENCOUNTER — Ambulatory Visit: Attending: Orthopedic Surgery

## 2024-10-29 ENCOUNTER — Other Ambulatory Visit: Payer: Self-pay

## 2024-10-29 DIAGNOSIS — R29898 Other symptoms and signs involving the musculoskeletal system: Secondary | ICD-10-CM | POA: Insufficient documentation

## 2024-10-29 DIAGNOSIS — M25532 Pain in left wrist: Secondary | ICD-10-CM | POA: Diagnosis present

## 2024-10-29 DIAGNOSIS — M25632 Stiffness of left wrist, not elsewhere classified: Secondary | ICD-10-CM | POA: Insufficient documentation

## 2024-10-29 DIAGNOSIS — Z9889 Other specified postprocedural states: Secondary | ICD-10-CM | POA: Diagnosis present

## 2024-10-29 DIAGNOSIS — G5602 Carpal tunnel syndrome, left upper limb: Secondary | ICD-10-CM | POA: Diagnosis not present

## 2024-10-29 NOTE — Therapy (Signed)
 " OUTPATIENT OCCUPATIONAL THERAPY ORTHO EVALUATION  Patient Name: Toni Miller MRN: [id number] DOB:11-13-1960, 63 y.o., female Today's Date: 10/29/2024  PCP: Antonetta Rollene BRAVO, MD REFERRING PROVIDER: Arlinda Buster, MD  END OF SESSION:  OT End of Session - 10/29/24 1425     Visit Number 1    Number of Visits 9   including eval   Date for Recertification  01/27/25    Authorization Type [employer] Dual Complete    OT Start Time 1318    OT Stop Time 1405    OT Time Calculation (min) 47 min    Equipment Utilized During Treatment testing materials, xeroform, stockinette    Activity Tolerance Patient tolerated treatment well    Behavior During Therapy WFL for tasks assessed/performed          Past Medical History:  Diagnosis Date   Anxiety    Arthritis    Cancer (HCC) 1993   abnormal pap treated at [facility]   Constipation    Depression    GERD (gastroesophageal reflux disease)    Glaucoma 1993   History of kidney stones    Hot flashes, menopausal 06/12/2011   Hyperlipidemia    Hypertension    no meds now   Neck pain 05/2008   WITH BULGING DISC-----RECIEVING EPIDURALS    Pinched nerve    back   Sinusitis    Past Surgical History:  Procedure Laterality Date   ABDOMINAL EXPLORATION SURGERY  age 62   bowel obstruction, [facility]   ABDOMINAL HYSTERECTOMY  2007   [facility], [facility]   BILATERAL EYE SURGERY     for glaucoma   BIOPSY  01/09/2019   Procedure: BIOPSY;  Surgeon: Harvey Margo CROME, MD;  Location: [facility];  Service: Endoscopy;;  gstric    bone removed from under tongue     BREAST BIOPSY Left    benign   BREAST CYST EXCISION Left 2004   BREAST SURGERY Left 2004   left partial mastectomy, [facility]   CARPAL TUNNEL RELEASE Left 10/25/2024   Procedure: LEFT WRIST ENDOSCOPIC CARPAL TUNNEL RELEASE;  Surgeon: Arlinda Buster, MD;  Location: [facility];  Service: Orthopedics;  Laterality: Left;   CATARACT EXTRACTION Right 06/04/2015   CATARACT EXTRACTION W/PHACO   05/16/2011   Procedure: CATARACT EXTRACTION PHACO AND INTRAOCULAR LENS PLACEMENT (IOC);  Surgeon: Cherene Mania;  Location: [facility];  Service: Ophthalmology;  Laterality: Right;   COLONOSCOPY N/A 05/17/2013   Dr. Sharla diverticulosis was noted/small internal hemorrhoids   COLONOSCOPY WITH PROPOFOL  N/A 04/12/2021   Colonoscopy June 2022: small anal sphincter s/p rectal surgery, internal hemorrhoids, one 2 mm polyp in cecum (benign). 10 year surveillance.   CYST REMOVED LEFT BREAST /BENIGN  2005   left, [facility]   ESOPHAGOGASTRODUODENOSCOPY N/A 01/09/2019   normal esophagus. Mild NSAID gastritis s/p biopsy. +H.pylori. treated with amoxicillin , biaxin , Nexium .SABRA ERADICATION DOCUMENTED   EYE SURGERY  2010, 1992 approx   bilateral   POLYPECTOMY  04/12/2021   Procedure: POLYPECTOMY;  Surgeon: Cindie Carlin POUR, DO;  Location: [facility];  Service: Endoscopy;;   REPAIR IMPERFORATE ANUS / ANORECTOPLASTY     per patient   TOTAL ABDOMINAL HYSTERECTOMY W/ BILATERAL SALPINGOOPHORECTOMY  2007   [facility], [facility]   TUBAL LIGATION  1991   Patient Active Problem List   Diagnosis Date Noted   Carpal tunnel syndrome, left upper limb 10/25/2024   Encounter for Medicare annual examination with abnormal findings 09/15/2024   Need for pneumococcal 20-valent conjugate vaccination 09/15/2024  Vitamin D  deficiency 09/15/2024   Elevated liver enzymes 04/27/2024   Hypokalemia 04/10/2024   Hyponatremia 04/10/2024   Elevated transaminase level 04/10/2024   Hyperbilirubinemia 04/10/2024   Back pain 03/26/2024   High serum vitamin B12 03/26/2024   Numbness and tingling in left hand 03/26/2024   GAD (generalized anxiety disorder) 01/14/2024   Other spondylosis with radiculopathy, cervical region 12/13/2023   Impingement syndrome of left shoulder 12/13/2023   Cough 09/12/2023   Immunization due 08/23/2023   Bloating 06/08/2023   Hot flashes due to menopause 04/26/2023   Positive self-administered antigen test for  COVID-19 12/23/2022   Syncope and collapse 12/09/2022   Right knee pain 09/07/2021   Headache, variant migraine 07/07/2021   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   Prolapsed internal hemorrhoids, grade 3 06/03/2021   Mild intermittent asthma without complication 06/19/2020   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   Menopausal vaginal dryness 10/09/2019   Knee pain, left 04/26/2019   Chronic right hip pain 04/26/2019   Dyspepsia 12/12/2018   FH: breast cancer in first degree relative when <30 years old 06/06/2018   Neck pain 04/21/2016   Arthritis of both knees 04/25/2015   Osteopenia 04/02/2014   Depression, major, recurrent, in partial remission 01/06/2014   H/O abnormal Pap smear 04/20/2013   Thyroid  nodule 08/16/2011   Right thigh pain 06/01/2011   Chronic constipation 06/01/2011   Low back pain with left-sided sciatica 12/29/2009   Headache(784.0) 09/26/2008   Hyperlipidemia with target LDL less than 100 01/29/2008   GLAUCOMA 01/29/2008   Hypertension 01/29/2008   GERD 01/29/2008    ONSET DATE: 10/01/2024 referral date, 10/25/24 CTR surgery  REFERRING DIAG: G56.02 (ICD-10-CM) - Carpal tunnel syndrome, left upper limb  IHP Pgs [phone]  THERAPY DIAG:  Pain in left wrist  Stiffness of left wrist, not elsewhere classified  Other symptoms and signs involving the musculoskeletal system  Status post endoscopic carpal tunnel release  Rationale for Evaluation and Treatment: Rehabilitation  SUBJECTIVE:   SUBJECTIVE STATEMENT: Pt reports I wasn't sure if I should take my dressings off yet presented with dressing still applied. Pt accompanied by: self  PERTINENT HISTORY: Pt received CTR to LUE on 10/25/24, pt as of eval date is 4 days post surgery.  PRECAUTIONS: Other: hx of breast cancer, no electrical modalities  RED FLAGS: None   WEIGHT BEARING RESTRICTIONS: Yes LUE light activities only at this time  PAIN:  Are you having pain?  Yes: NPRS scale: 5/10 Pain location: L wrist  Pain description: sharp Aggravating factors: strain Relieving factors: medications  FALLS: Has patient fallen in last 6 months? No  LIVING ENVIRONMENT: Lives with: lives with their son Lives in: House/apartment Stairs: No Has following equipment at home: Grab bars  PLOF: Independent  PATIENT GOALS: To reduce pain  NEXT MD VISIT: 11/06/24  OBJECTIVE:  Note: Objective measures were completed at Evaluation unless otherwise noted.  HAND DOMINANCE: Right  ADLs: WFL  FUNCTIONAL OUTCOME MEASURES: Quick Dash: 34.1  UPPER EXTREMITY ROM:     Active ROM Right eval Left eval  Shoulder flexion    Shoulder abduction    Shoulder adduction    Shoulder extension    Shoulder internal rotation    Shoulder external rotation    Elbow flexion    Elbow extension    Wrist flexion  50  Wrist extension  10  Wrist ulnar deviation  10  Wrist radial deviation    Wrist pronation    Wrist supination    (  Blank rows = not tested)  Active ROM Right eval Left eval  Thumb MCP (0-60)    Thumb IP (0-80)    Thumb Radial abd/add (0-55)     Thumb Palmar abd/add (0-45)     Thumb Opposition to Small Finger     Index MCP (0-90)     Index PIP (0-100)     Index DIP (0-70)      Long MCP (0-90)      Long PIP (0-100)      Long DIP (0-70)      Ring MCP (0-90)      Ring PIP (0-100)      Ring DIP (0-70)      Little MCP (0-90)      Little PIP (0-100)      Little DIP (0-70)      (Blank rows = not tested)   UPPER EXTREMITY MMT:     MMT Right eval Left eval  Shoulder flexion    Shoulder abduction    Shoulder adduction    Shoulder extension    Shoulder internal rotation    Shoulder external rotation    Middle trapezius    Lower trapezius    Elbow flexion    Elbow extension    Wrist flexion    Wrist extension    Wrist ulnar deviation    Wrist radial deviation    Wrist pronation    Wrist supination    (Blank rows = not tested)  HAND  FUNCTION: Grip strength: Right: 32.3 avg lbs; Left: NT lbs Right: (35.7, 28.2, 33) COORDINATION: 9 Hole Peg test: Right: 26.98 sec; Left: 40.85 sec Box and Blocks:  Right 59blocks, Left 37blocks  SENSATION: Paresthesias in L hand  EDEMA: none  COGNITION: Overall cognitive status: Within functional limits for tasks assessed Areas of impairment: WNL  OBSERVATIONS: limited ROM, coordination, strength in L hand/wrist, pain, paresthesias   TREATMENT DATE: 10/29/24                                                                                                                           - Self-care/home management completed for duration as noted below including: Pt educated in purpose of OT, POC, and goals. Pt educated that she may remove the surgical dressings per IHP protocol. Removed dressing and applied saline solution to allow for removal of gauze. Pt provided light dressing including stockinette and Xeroform for protection of surgical area. Provided pt with additional to apply when needed, instructed to change Xeroform at least once a day.    PATIENT EDUCATION: Education details: purpose of goals, POC, goals, stockinette/Xeroform use  Person educated: Patient Education method: Explanation Education comprehension: verbalized understanding and needs further education  HOME EXERCISE PROGRAM:   GOALS: Goals reviewed with patient? Yes  SHORT TERM GOALS: Target date: 11/29/24  Patient will demonstrate updated LUE HEP with 25% verbal cues or less for proper execution.  Baseline: New to OP OT Goal status: INITIAL  2.  Pt will independently recall at least 3 joint protection, ergonomics, and body mechanic principles as noted in pt instructions.   Baseline: New to OP OT Goal status: INITIAL  3.  Pt will be independent with scar tissue massage Baseline: New to OP OT Goal status: INITIAL  4.  OT will assess grip strength of L hand when appropriate and create goal as  needed. Baseline: New to OP OT Goal status: INITIAL  5.  Pt will increase L wrist flexion and extension by at least 10 degrees each Baseline: wrist flex 50 degrees, wrist ext 10 degrees  Goal status: INITIAL  6.  Pt will increase L radial deviation by at least 5 degrees  Baseline: 10 degrees  Goal status: INITIAL  LONG TERM GOALS: Target date: 01/27/25  Patient will demonstrate at least 16% improvement with quick Dash score (reporting 18.1% disability or less) indicating improved functional use of affected extremity.  Baseline: 34.1/100 Goal status: INITIAL  2.  Patient will demo improved FM coordination as evidenced by completing nine-hole peg with use of L hand in 35 seconds or less.  Baseline: Right: 26.98 sec; Left: 40.85 sec Goal status: INITIAL  3.  Pt will be able to place at least 45 blocks using left hand with completion of Box and Blocks test.  Baseline: Right 59 blocks, Left 37blocks Goal status: INITIAL  4.  Pt will increase L wrist flexion and extension by at least 20 degrees each Baseline: wrist flex 50 degrees, wrist ext 10 degrees  Goal status: INITIAL  5.  Patient will independently recall at least 3 energy conservation principles in relation to ADLs to increase functional independence.  Baseline: New to OP OT Goal status: INITIAL  6.  Pt will be independent with HEP targeting L wrist/hand ROM and strength  Baseline: New to OP OT Goal status: INITIAL  ASSESSMENT:  CLINICAL IMPRESSION: Patient is a 63 y.o. female who was seen today for occupational therapy evaluation for s/p L CTR surgery on 10/25/24. Hx includes anxiety, arthritis, GERD, HLD, HTN. Patient currently presents below baseline level of functioning demonstrating functional deficits and impairments as noted below. Pt would benefit from skilled OT services in the outpatient setting to work on impairments as noted below to help pt return to PLOF as able.     PERFORMANCE DEFICITS: in functional  skills including ADLs, IADLs, coordination, dexterity, sensation, ROM, strength, pain, flexibility, decreased knowledge of precautions, decreased knowledge of use of DME, and UE functional use, and psychosocial skills including coping strategies and environmental adaptation.   IMPAIRMENTS: are limiting patient from ADLs, IADLs, and social participation.   COMORBIDITIES: may have co-morbidities  that affects occupational performance. Patient will benefit from skilled OT to address above impairments and improve overall function.  MODIFICATION OR ASSISTANCE TO COMPLETE EVALUATION: No modification of tasks or assist necessary to complete an evaluation.  OT OCCUPATIONAL PROFILE AND HISTORY: Problem focused assessment: Including review of records relating to presenting problem.  CLINICAL DECISION MAKING: LOW - limited treatment options, no task modification necessary  REHAB POTENTIAL: Good  EVALUATION COMPLEXITY: Low      PLAN:  OT FREQUENCY: 1x/week  OT DURATION: 8 weeks  PLANNED INTERVENTIONS: 97168 OT Re-evaluation, 97535 self care/ADL training, 02889 therapeutic exercise, 97530 therapeutic activity, 97035 ultrasound, 97018 paraffin, 02960 fluidotherapy, 97010 moist heat, 97010 cryotherapy, scar mobilization, passive range of motion, energy conservation, coping strategies training, patient/family education, and DME and/or AE instructions  RECOMMENDED OTHER SERVICES: none  CONSULTED AND AGREED WITH PLAN OF  CARE: Patient  PLAN FOR NEXT SESSION: scar tissue massage PROM as allowed Modalities once sutures removed Joint protection   Rocky Dutch, OT 10/29/2024, 2:35 PM   "

## 2024-11-01 ENCOUNTER — Ambulatory Visit

## 2024-11-05 ENCOUNTER — Ambulatory Visit (INDEPENDENT_AMBULATORY_CARE_PROVIDER_SITE_OTHER): Admitting: Gastroenterology

## 2024-11-05 VITALS — BP 130/82 | HR 69 | Temp 98.6°F | Ht 61.0 in | Wt 118.8 lb

## 2024-11-05 DIAGNOSIS — K219 Gastro-esophageal reflux disease without esophagitis: Secondary | ICD-10-CM | POA: Diagnosis not present

## 2024-11-05 DIAGNOSIS — R143 Flatulence: Secondary | ICD-10-CM | POA: Diagnosis not present

## 2024-11-05 DIAGNOSIS — R140 Abdominal distension (gaseous): Secondary | ICD-10-CM | POA: Diagnosis not present

## 2024-11-05 DIAGNOSIS — K5900 Constipation, unspecified: Secondary | ICD-10-CM

## 2024-11-05 NOTE — Progress Notes (Unsigned)
" ° °  Toni Miller - 63 y.o. female MRN [id number]  Date of birth: Nov 11, 1960  Office Visit Note: Visit Date: 11/06/2024 PCP: Antonetta Rollene BRAVO, MD Referred by: Antonetta Rollene BRAVO, MD  Subjective:  HPI: Toni Miller is a 63 y.o. female who presents today for follow up 2 weeks status post left wrist endoscopic carpal tunnel release.  Pertinent ROS were reviewed with the patient and found to be negative unless otherwise specified above in HPI.   Assessment & Plan: Visit Diagnoses: No diagnosis found.  Plan: ***  Follow-up: No follow-ups on file.   Meds & Orders: No orders of the defined types were placed in this encounter.  No orders of the defined types were placed in this encounter.    Procedures: No procedures performed       Objective:   Vital Signs: There were no vitals taken for this visit.  Ortho Exam ***  Imaging: No results found.   Anshul Afton Alderton, M.D. [facility], Hand Surgery  "

## 2024-11-05 NOTE — Progress Notes (Signed)
 "   Gastroenterology Office Note     Primary Care Physician:  Antonetta Rollene BRAVO, MD  Primary Gastroenterologist: Dr. Cindie    Chief Complaint   Chief Complaint  Patient presents with   Follow-up    Follow up GERD, constipation, hemorrhoids and blood in stool. Pt states she has no complaints today     History of Present Illness   Toni Miller is a 63 y.o. female presenting today with a history of chronic constipation, bloating (negative celiac serologies in past), GERD, +H.yplori 2020 with documented eradication via breath test in Nov 2021, hemorrhoid banding in 2023   When last seen, she was changed from Nexium  to pantoprazole  due to lack of efficacy with Nexium . She is doing well with this. No dysphagia.   Broccoli and cauliflower causes gas. Increased fiber in diet. Will take benefiber at times. This helps when she does take it. Continues to eat foods that cause more gas. No abdominal pain. No N/V. Scant tissue hematochezia when wiped one time. No rectal pain. FODMAP trial helped in the past with gas.   Colonoscopy 2022: small anal sphincter s/p rectal surgery, internal hemorrhoids, one 2 mm polyp in cecum benign, 10 year surveillance.    EGD 2020: normal esophagus, mild NSAID gatritis s/p biopsy, +H.pylori s/p treatment, eradication documented.    Cologuard negative 2025.    No FH colon cancer Mom: stomach cancer   Past Medical History:  Diagnosis Date   Anxiety    Arthritis    Cancer (HCC) 1993   abnormal pap treated at [facility]   Constipation    Depression    GERD (gastroesophageal reflux disease)    Glaucoma 1993   History of kidney stones    Hot flashes, menopausal 06/12/2011   Hyperlipidemia    Hypertension    no meds now   Neck pain 05/2008   WITH BULGING DISC-----RECIEVING EPIDURALS    Pinched nerve    back   Sinusitis     Past Surgical History:  Procedure Laterality Date   ABDOMINAL EXPLORATION SURGERY  age 72   bowel obstruction, [facility]    ABDOMINAL HYSTERECTOMY  2007   [facility], [facility]   BILATERAL EYE SURGERY     for glaucoma   BIOPSY  01/09/2019   Procedure: BIOPSY;  Surgeon: Harvey Margo CROME, MD;  Location: [facility];  Service: Endoscopy;;  gstric    bone removed from under tongue     BREAST BIOPSY Left    benign   BREAST CYST EXCISION Left 2004   BREAST SURGERY Left 2004   left partial mastectomy, [facility]   CARPAL TUNNEL RELEASE Left 10/25/2024   Procedure: LEFT WRIST ENDOSCOPIC CARPAL TUNNEL RELEASE;  Surgeon: Arlinda Buster, MD;  Location:  SURGERY CENTER;  Service: Orthopedics;  Laterality: Left;   CATARACT EXTRACTION Right 06/04/2015   CATARACT EXTRACTION W/PHACO  05/16/2011   Procedure: CATARACT EXTRACTION PHACO AND INTRAOCULAR LENS PLACEMENT (IOC);  Surgeon: Cherene Mania;  Location: [facility];  Service: Ophthalmology;  Laterality: Right;   COLONOSCOPY N/A 05/17/2013   Dr. Sharla diverticulosis was noted/small internal hemorrhoids   COLONOSCOPY WITH PROPOFOL  N/A 04/12/2021   Colonoscopy June 2022: small anal sphincter s/p rectal surgery, internal hemorrhoids, one 2 mm polyp in cecum (benign). 10 year surveillance.   CYST REMOVED LEFT BREAST /BENIGN  2005   left, [facility]   ESOPHAGOGASTRODUODENOSCOPY N/A 01/09/2019   normal esophagus. Mild NSAID gastritis s/p biopsy. +H.pylori. treated with amoxicillin , biaxin , Nexium .. ERADICATION DOCUMENTED   EYE  SURGERY  2010, 1992 approx   bilateral   POLYPECTOMY  04/12/2021   Procedure: POLYPECTOMY;  Surgeon: Cindie Carlin POUR, DO;  Location: [facility];  Service: Endoscopy;;   REPAIR IMPERFORATE ANUS / ANORECTOPLASTY     per patient   TOTAL ABDOMINAL HYSTERECTOMY W/ BILATERAL SALPINGOOPHORECTOMY  2007   [facility], [facility]   TUBAL LIGATION  1991    Current Outpatient Medications  Medication Sig Dispense Refill   cholecalciferol (VITAMIN D3) 25 MCG (1000 UNIT) tablet Take 1,000 Units by mouth daily.     cycloSPORINE  (RESTASIS ) 0.05 % ophthalmic emulsion Place 1 drop into  both eyes 2 (two) times daily.     fluticasone  (FLONASE ) 50 MCG/ACT nasal spray Place 1 spray into both nostrils daily as needed for allergies or rhinitis. 16 mL 2   gabapentin  (NEURONTIN ) 300 MG capsule Take 1 capsule (300 mg total) by mouth 2 (two) times daily. 60 capsule 11   meloxicam  (MOBIC ) 7.5 MG tablet Take 1 tablet (7.5 mg total) by mouth daily as needed for pain. 30 tablet 3   montelukast  (SINGULAIR ) 10 MG tablet Take 1 tablet (10 mg total) by mouth daily. 80 tablet 3   Multiple Vitamin (MULTIVITAMIN) capsule Take 1 capsule by mouth daily.     pantoprazole  (PROTONIX ) 40 MG tablet Take 1 tablet (40 mg total) by mouth daily. 30 minutes before breakfast 90 tablet 3   PREMARIN  vaginal cream APPLY FINGERTIP AMOUNT VAGINALLY 3 TIMES WEEKLY AS NEEDED 30 g 2   rosuvastatin  (CRESTOR ) 10 MG tablet Take 1 tablet (10 mg total) by mouth at bedtime. 100 tablet 2   saccharomyces boulardii (FLORASTOR) 250 MG capsule Take 1 capsule (250 mg total) by mouth 2 (two) times daily. 60 capsule 3   TRAVATAN  Z 0.004 % SOLN ophthalmic solution Place 1 drop into both eyes at bedtime.     triamcinolone  cream (KENALOG ) 0.1 % Apply 1 Application topically 2 (two) times daily. 30 g 0   venlafaxine  XR (EFFEXOR -XR) 75 MG 24 hr capsule Take 1 capsule (75 mg total) by mouth daily with breakfast. 30 capsule 3   oxyCODONE  (ROXICODONE ) 5 MG immediate release tablet Take 1 tablet (5 mg total) by mouth every 6 (six) hours as needed. (Patient not taking: Reported on 11/05/2024) 20 tablet 0   No current facility-administered medications for this visit.    Allergies as of 11/05/2024 - Review Complete 11/05/2024  Allergen Reaction Noted   Acyclovir  and related Rash 03/02/2015    Family History  Adopted: Yes  Problem Relation Age of Onset   Alcohol abuse Mother    Stomach cancer Mother 38   Lung cancer Father    Glaucoma Father    Alcohol abuse Father    Seizures Father    Breast cancer Sister 58   Breast cancer Sister  74   Heart disease Sister    Heart attack Sister    Glaucoma Son    Glaucoma Maternal Grandfather    Cancer Maternal Grandfather        poss leukemia   SIDS Brother    Hyperthyroidism Daughter    Seizures Son        as a development worker, international aid   Kidney disease Maternal Grandmother    Heart disease Maternal Grandmother        visual merchandiser   Leukemia Maternal Grandmother    Colon cancer Neg Hx     Social History   Socioeconomic History   Marital status: Significant Other    Spouse name:  Not on file   Number of children: 3   Years of education: 10   Highest education level: 12th grade  Occupational History   Occupation: UNEMPLOYED 2011    Comment: disability    Employer: DISABLED  Tobacco Use   Smoking status: Former    Current packs/day: 0.00    Average packs/day: 3.0 packs/day for 2.0 years (6.0 ttl pk-yrs)    Types: Cigarettes    Start date: 05/11/1989    Quit date: 05/12/1991    Years since quitting: 33.5   Smokeless tobacco: Never  Vaping Use   Vaping status: Never Used  Substance and Sexual Activity   Alcohol use: Not Currently   Drug use: Never   Sexual activity: Yes    Birth control/protection: Surgical  Other Topics Concern   Not on file  Social History Narrative   Lives with son, Elspeth. 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   caffien- coffee, 1 cup daily   Social Drivers of Health   Tobacco Use: Medium Risk (10/28/2024)   Patient History    Smoking Tobacco Use: Former    Smokeless Tobacco Use: Never    Passive Exposure: Not on file  Financial Resource Strain: Low Risk (10/25/2023)   Overall Financial Resource Strain (CARDIA)    Difficulty of Paying Living Expenses: Not hard at all  Food Insecurity: No Food Insecurity (10/28/2024)   Epic    Worried About Radiation Protection Practitioner of Food in the Last Year: Never true    Ran Out of Food in the Last Year: Never true  Transportation Needs: No Transportation Needs (10/28/2024)   Epic    Lack of Transportation (Medical): No    Lack of Transportation  (Non-Medical): No  Physical Activity: Patient Declined (10/28/2024)   Exercise Vital Sign    Days of Exercise per Week: Patient declined    Minutes of Exercise per Session: Patient declined  Stress: No Stress Concern Present (10/28/2024)   [employer] of Occupational Health - Occupational Stress Questionnaire    Feeling of Stress: Only a little  Social Connections: Moderately Isolated (10/28/2024)   Social Connection and Isolation Panel    Frequency of Communication with Friends and Family: Twice a week    Frequency of Social Gatherings with Friends and Family: More than three times a week    Attends Religious Services: More than 4 times per year    Active Member of Clubs or Organizations: No    Attends Banker Meetings: Never    Marital Status: Divorced  Catering Manager Violence: Not At Risk (10/28/2024)   Epic    Fear of Current or Ex-Partner: No    Emotionally Abused: No    Physically Abused: No    Sexually Abused: No  Depression (PHQ2-9): Medium Risk (10/28/2024)   Depression (PHQ2-9)    PHQ-2 Score: 8  Alcohol Screen: Low Risk (10/25/2023)   Alcohol Screen    Last Alcohol Screening Score (AUDIT): 0  Housing: Low Risk (10/28/2024)   Epic    Unable to Pay for Housing in the Last Year: No    Number of Times Moved in the Last Year: 0    Homeless in the Last Year: No  Utilities: Not At Risk (10/28/2024)   Epic    Threatened with loss of utilities: No  Health Literacy: Adequate Health Literacy (10/28/2024)   B1300 Health Literacy    Frequency of need for help with medical instructions: Never     Review of Systems   Gen: Denies any fever,  chills, fatigue, weight loss, lack of appetite.  CV: Denies chest pain, heart palpitations, peripheral edema, syncope.  Resp: Denies shortness of breath at rest or with exertion. Denies wheezing or cough.  GI: Denies dysphagia or odynophagia. Denies jaundice, hematemesis, fecal incontinence. GU : Denies urinary  burning, urinary frequency, urinary hesitancy MS: Denies joint pain, muscle weakness, cramps, or limitation of movement.  Derm: Denies rash, itching, dry skin Psych: Denies depression, anxiety, memory loss, and confusion Heme: Denies bruising, bleeding, and enlarged lymph nodes.   Physical Exam   BP 130/82   Pulse 69   Temp 98.6 F (37 C)   Ht 5' 1 (1.549 m)   Wt 118 lb 12.8 oz (53.9 kg)   BMI 22.45 kg/m  General:   Alert and oriented. Pleasant and cooperative. Well-nourished and well-developed.  Head:  Normocephalic and atraumatic. Eyes:  Without icterus Abdomen:  +BS, soft, non-tender and non-distended. No HSM noted. No guarding or rebound. No masses appreciated.  Rectal:  Deferred  Msk:  Symmetrical without gross deformities. Normal posture. Extremities:  Without edema. Neurologic:  Alert and  oriented x4;  grossly normal neurologically. Skin:  Intact without significant lesions or rashes. Psych:  Alert and cooperative. Normal mood and affect.   Assessment   Chronic GERD Constipation Gas/Bloating at baseline      PLAN   GERD is well controlled on pantoprazole  once daily, so we will continue this.  Constipation is at bay with taking Benefiber daily and food choices. Discussed she could take a probiotic daily if she would like although may or may not be helpful.  Chronic gas/bloating due to dietary measures; improvement with FODMAP historically. No alarm signs/symptoms. Continue dietary and behavior modification.   Colonoscopy in 2032 unless clinical changes. Cologuard negative this year. If any recurrent bleeding, low threshold for colonoscopy.   Return in 1 year  Therisa MICAEL Stager, PhD, ANP-BC [facility]    "

## 2024-11-05 NOTE — Patient Instructions (Addendum)
 You can get the probiotic called women's probiotic in the pink label over the counter.  Continue Benefiber daily for good bowel regimen.   Continue pantoprazole  daily.  Have a wonderful New Year!  Please call with any concerns, otherwise we will see you in 1 year!   I enjoyed seeing you again today! I value our relationship and want to provide genuine, compassionate, and quality care. You may receive a survey regarding your visit with me, and I welcome your feedback! Thanks so much for taking the time to complete this. I look forward to seeing you again.      Therisa MICAEL Stager, PhD, ANP-BC [facility]

## 2024-11-06 ENCOUNTER — Ambulatory Visit: Admitting: Orthopedic Surgery

## 2024-11-06 DIAGNOSIS — G5602 Carpal tunnel syndrome, left upper limb: Secondary | ICD-10-CM

## 2024-11-06 DIAGNOSIS — Z9889 Other specified postprocedural states: Secondary | ICD-10-CM

## 2024-11-12 ENCOUNTER — Ambulatory Visit: Attending: Orthopedic Surgery | Admitting: Occupational Therapy

## 2024-11-12 DIAGNOSIS — R278 Other lack of coordination: Secondary | ICD-10-CM | POA: Insufficient documentation

## 2024-11-12 DIAGNOSIS — M6281 Muscle weakness (generalized): Secondary | ICD-10-CM | POA: Diagnosis present

## 2024-11-12 DIAGNOSIS — Z9889 Other specified postprocedural states: Secondary | ICD-10-CM | POA: Insufficient documentation

## 2024-11-12 DIAGNOSIS — M25632 Stiffness of left wrist, not elsewhere classified: Secondary | ICD-10-CM | POA: Diagnosis present

## 2024-11-12 DIAGNOSIS — R29898 Other symptoms and signs involving the musculoskeletal system: Secondary | ICD-10-CM | POA: Insufficient documentation

## 2024-11-12 DIAGNOSIS — L905 Scar conditions and fibrosis of skin: Secondary | ICD-10-CM | POA: Insufficient documentation

## 2024-11-12 DIAGNOSIS — M25532 Pain in left wrist: Secondary | ICD-10-CM | POA: Diagnosis present

## 2024-11-12 NOTE — Patient Instructions (Signed)
 Access Code: [email or web address] URL: [email or web address] Date: 11/12/2024 Prepared by: Clarita Pride  Exercises - Putty Squeezes  - 1-2 x daily - 10 reps - Rolling Putty on Table  - 1-2 x daily - 10 reps - Finger Pinch and Pull with Putty  - 1-2 x daily - 10 reps - 3-Point Pinch with Putty  - 1-2 x daily - 10 reps - Tip PUSH with Putty  - 1-2 x daily - 10 reps - Key Pinch with Putty  - 1-2 x daily - 10 reps - Finger Extension with Putty  - 1-2 x daily - 10 reps - Finger Adduction with Putty  - 1-2 x daily - 10 reps - Removing Marbles from Putty  - 1-2 x daily - 10 reps

## 2024-11-12 NOTE — Therapy (Signed)
 " OUTPATIENT OCCUPATIONAL THERAPY ORTHO TREATMENT  Patient Name: Toni Miller MRN: [id number] DOB:24-Jan-1961, 64 y.o., female Today's Date: 11/12/2024  PCP: Antonetta Rollene BRAVO, MD REFERRING PROVIDER: Arlinda Buster, MD  END OF SESSION:  OT End of Session - 11/12/24 1110     Visit Number 2    Number of Visits 9   including eval   Date for Recertification  01/27/25    Authorization Type [employer] Dual Complete    OT Start Time 1110    OT Stop Time 1155    OT Time Calculation (min) 45 min    Equipment Utilized During Treatment yellow putty    Activity Tolerance Patient tolerated treatment well    Behavior During Therapy WFL for tasks assessed/performed          Past Medical History:  Diagnosis Date   Anxiety    Arthritis    Cancer (HCC) 1993   abnormal pap treated at [facility]   Constipation    Depression    GERD (gastroesophageal reflux disease)    Glaucoma 1993   History of kidney stones    Hot flashes, menopausal 06/12/2011   Hyperlipidemia    Hypertension    no meds now   Neck pain 05/2008   WITH BULGING DISC-----RECIEVING EPIDURALS    Pinched nerve    back   Sinusitis    Past Surgical History:  Procedure Laterality Date   ABDOMINAL EXPLORATION SURGERY  age 89   bowel obstruction, [facility]   ABDOMINAL HYSTERECTOMY  2007   [facility], [facility]   BILATERAL EYE SURGERY     for glaucoma   BIOPSY  01/09/2019   Procedure: BIOPSY;  Surgeon: Harvey Margo CROME, MD;  Location: [facility];  Service: Endoscopy;;  gstric    bone removed from under tongue     BREAST BIOPSY Left    benign   BREAST CYST EXCISION Left 2004   BREAST SURGERY Left 2004   left partial mastectomy, [facility]   CARPAL TUNNEL RELEASE Left 10/25/2024   Procedure: LEFT WRIST ENDOSCOPIC CARPAL TUNNEL RELEASE;  Surgeon: Arlinda Buster, MD;  Location:  SURGERY CENTER;  Service: Orthopedics;  Laterality: Left;   CATARACT EXTRACTION Right 06/04/2015   CATARACT EXTRACTION W/PHACO  05/16/2011   Procedure: CATARACT  EXTRACTION PHACO AND INTRAOCULAR LENS PLACEMENT (IOC);  Surgeon: Cherene Mania;  Location: [facility];  Service: Ophthalmology;  Laterality: Right;   COLONOSCOPY N/A 05/17/2013   Dr. Sharla diverticulosis was noted/small internal hemorrhoids   COLONOSCOPY WITH PROPOFOL  N/A 04/12/2021   Colonoscopy June 2022: small anal sphincter s/p rectal surgery, internal hemorrhoids, one 2 mm polyp in cecum (benign). 10 year surveillance.   CYST REMOVED LEFT BREAST /BENIGN  2005   left, [facility]   ESOPHAGOGASTRODUODENOSCOPY N/A 01/09/2019   normal esophagus. Mild NSAID gastritis s/p biopsy. +H.pylori. treated with amoxicillin , biaxin , Nexium .. ERADICATION DOCUMENTED   EYE SURGERY  2010, 1992 approx   bilateral   POLYPECTOMY  04/12/2021   Procedure: POLYPECTOMY;  Surgeon: Cindie Carlin POUR, DO;  Location: [facility];  Service: Endoscopy;;   REPAIR IMPERFORATE ANUS / ANORECTOPLASTY     per patient   TOTAL ABDOMINAL HYSTERECTOMY W/ BILATERAL SALPINGOOPHORECTOMY  2007   [facility], [facility]   TUBAL LIGATION  1991   Patient Active Problem List   Diagnosis Date Noted   Carpal tunnel syndrome, left upper limb 10/25/2024   Encounter for Medicare annual examination with abnormal findings 09/15/2024   Need for pneumococcal 20-valent conjugate vaccination 09/15/2024   Vitamin D   deficiency 09/15/2024   Elevated liver enzymes 04/27/2024   Hypokalemia 04/10/2024   Hyponatremia 04/10/2024   Elevated transaminase level 04/10/2024   Hyperbilirubinemia 04/10/2024   Back pain 03/26/2024   High serum vitamin B12 03/26/2024   Numbness and tingling in left hand 03/26/2024   GAD (generalized anxiety disorder) 01/14/2024   Other spondylosis with radiculopathy, cervical region 12/13/2023   Impingement syndrome of left shoulder 12/13/2023   Cough 09/12/2023   Immunization due 08/23/2023   Bloating 06/08/2023   Hot flashes due to menopause 04/26/2023   Positive self-administered antigen test for COVID-19 12/23/2022   Syncope and  collapse 12/09/2022   Right knee pain 09/07/2021   Headache, variant migraine 07/07/2021   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   Prolapsed internal hemorrhoids, grade 3 06/03/2021   Mild intermittent asthma without complication 06/19/2020   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   Menopausal vaginal dryness 10/09/2019   Knee pain, left 04/26/2019   Chronic right hip pain 04/26/2019   Dyspepsia 12/12/2018   FH: breast cancer in first degree relative when <34 years old 06/06/2018   Neck pain 04/21/2016   Arthritis of both knees 04/25/2015   Osteopenia 04/02/2014   Depression, major, recurrent, in partial remission 01/06/2014   H/O abnormal Pap smear 04/20/2013   Thyroid  nodule 08/16/2011   Right thigh pain 06/01/2011   Constipation 06/01/2011   Low back pain with left-sided sciatica 12/29/2009   Headache(784.0) 09/26/2008   Hyperlipidemia with target LDL less than 100 01/29/2008   GLAUCOMA 01/29/2008   Hypertension 01/29/2008   GERD 01/29/2008    ONSET DATE: 10/01/2024 referral date, 10/25/24 CTR surgery  REFERRING DIAG: G56.02 (ICD-10-CM) - Carpal tunnel syndrome, left upper limb  IHP Pgs [phone]  THERAPY DIAG:  Muscle weakness (generalized)  Other lack of coordination  Stiffness of left wrist, not elsewhere classified  Scar condition and fibrosis of skin  Other symptoms and signs involving the musculoskeletal system  Status post endoscopic carpal tunnel release  Rationale for Evaluation and Treatment: Rehabilitation  SUBJECTIVE:   SUBJECTIVE STATEMENT: Pt reports her wrist has been feeling better ie) discomfort down to 1/10 today  Pt accompanied by: self  PERTINENT HISTORY: Pt received CTR to LUE on 10/25/24, pt as of eval date is 4 days post surgery.  PRECAUTIONS: Other: hx of breast cancer, no electrical modalities  RED FLAGS: None   WEIGHT BEARING RESTRICTIONS: Yes LUE light activities only at this time  PAIN:  Are you  having pain? Yes: NPRS scale: 1/10 Pain location: L wrist  Pain description: aching (not sharp) Aggravating factors: strain Relieving factors: rest  FALLS: Has patient fallen in last 6 months? No  LIVING ENVIRONMENT: Lives with: lives with their son Lives in: House/apartment Stairs: No Has following equipment at home: Grab bars  PLOF: Independent  PATIENT GOALS: To reduce pain  NEXT MD VISIT: 11/06/24 - sutures removed; needs appt 1 month out  OBJECTIVE:  Note: Objective measures were completed at Evaluation unless otherwise noted.  HAND DOMINANCE: Right  ADLs: WFL  FUNCTIONAL OUTCOME MEASURES: Quick Dash: 34.1  UPPER EXTREMITY ROM:     Active ROM Right eval Left eval  Shoulder flexion    Shoulder abduction    Shoulder adduction    Shoulder extension    Shoulder internal rotation    Shoulder external rotation    Elbow flexion    Elbow extension    Wrist flexion  50  Wrist extension  10  Wrist ulnar deviation  10  Wrist radial deviation    Wrist pronation    Wrist supination    (Blank rows = not tested)  Active ROM Right eval Left eval  Thumb MCP (0-60)    Thumb IP (0-80)    Thumb Radial abd/add (0-55)     Thumb Palmar abd/add (0-45)     Thumb Opposition to Small Finger     Index MCP (0-90)     Index PIP (0-100)     Index DIP (0-70)      Long MCP (0-90)      Long PIP (0-100)      Long DIP (0-70)      Ring MCP (0-90)      Ring PIP (0-100)      Ring DIP (0-70)      Little MCP (0-90)      Little PIP (0-100)      Little DIP (0-70)      (Blank rows = not tested)   UPPER EXTREMITY MMT:     MMT Right eval Left eval  Shoulder flexion    Shoulder abduction    Shoulder adduction    Shoulder extension    Shoulder internal rotation    Shoulder external rotation    Middle trapezius    Lower trapezius    Elbow flexion    Elbow extension    Wrist flexion    Wrist extension    Wrist ulnar deviation    Wrist radial deviation    Wrist  pronation    Wrist supination    (Blank rows = not tested)  HAND FUNCTION: Grip strength: Right: 32.3 avg lbs; Left: NT lbs Right: (35.7, 28.2, 33)  11/13/23: Grip Strength Right: 38.1, 33.5, 32.6 lbs Left: 22.4, 20.5, 18.5 lbs Average Right 34.7 lbs; Left 20.5 lbs  COORDINATION: 9 Hole Peg test: Right: 26.98 sec; Left: 40.85 sec Box and Blocks:  Right 59 blocks, Left 37 blocks  SENSATION: Paresthesias in L hand  EDEMA: none  COGNITION: Overall cognitive status: Within functional limits for tasks assessed Areas of impairment: WNL  OBSERVATIONS: limited ROM, coordination, strength in L hand/wrist, pain, paresthesias   TODAY'S TREATMENT:                                                                                                                          - Manual therapy completed for duration as noted below including: Therapist provided gentle manual techniques and education to pt in scar massage at healed site of incision for reduction of scar tissue, to promote improved AROM and pain reduction of affected surgical site. Educated in need to decrease hypersensitivity, decrease palpable adhesions and improve ROM.  Pt provided instruction re: massaging scar in three directions: circles, side to side, and up and down with return demonstration sought but handout not provided yet.  - Therapeutic exercises and activities completed for duration as noted below including:  Pt issued tendon gliding exercises/handout with review of motions to isolate DIP, PIP and MCP  joints for straight finger position, hook (DIP/PIP flexion), fist (DIP/PIP/MCP flexion), taco/duck (MCP flexion only) and flat fist (MCP and PIP flexion). Education provided on purpose of tendon glide exercises ie) to increase the circulation to the hand and wrist, reduce swelling and promote healthier soft tissue for increased AROM and not to build hand or wrist strength at this time.  Initiated Putty Exercises with yellow  putty to begin strengthening, coordination and sensory stimulation of L UE.  Patient provided visual demonstration, verbal and tactile cues as needed to improve performance of the various exercises/activities including:   - Putty Squeezes - cues to squeeze putty into log for use with other exercises and to fold putty in half with 1 hand or roll into a cinnamon bun with 1 hand - trying to keep her hand off the table as able  - Putty Rolls - encourage to roll putty into logs with sensory stimulation to entire length of hand, fingers and wrist ie) over scar for desensitization as able   - Pinch and Pull with Putty - this motion is combined with different pinches (3-Point Pinch, Tip Pinch, Key Pinch) - patient encouraged to combine tripod, pincer and/or key pinch with pinch and pull motion of putty pulling away from midline, changing between different pinches and changing different directions to change grip  - Finger Extension with Putty - pt shown how to work on task with all fingers and thumb as well as individual fingers in opposition to thumb with forearm pronated to table top or supinated with hand/arm unsupported  - Finger Adduction with Putty - pt shown how to work on weaving putty between fingers/thumb and then squeeze fingers together while laying hand flat on table top.  - Removing Objects from Putty  - encouraged to hide items (coins, marble, dice etc) and use one hand at a time to find the objects and identify them by tactile input before she digs them out and can see them visually.  OT educated patient on theraputty recommendations: avoid small children, pets, hot environments, place in designated container and avoid contact with fabrics. Patient verbalized understanding.    Patient benefited from extra time, verbal/tactile cues, and modeling of task to allow time for processing of verbal instructions and improve motor planning of unfamiliar movements.  PATIENT EDUCATION: Education  details: Putty exercises/activities; Tendon Glides Person educated: Patient Education method: Explanation, Demonstration, Tactile cues, Verbal cues, and Handouts Education comprehension: verbalized understanding, returned demonstration, verbal cues required, tactile cues required, and needs further education  HOME EXERCISE PROGRAM:  11/12/24:Tendon Glides; Putty HEP Access Code: [email or web address]    GOALS: Goals reviewed with patient? Yes  SHORT TERM GOALS: Target date: 11/29/24  Patient will demonstrate updated LUE HEP with 25% verbal cues or less for proper execution. Baseline: New to OP OT Goal status: IN Progress  2.  Pt will independently recall at least 3 joint protection, ergonomics, and body mechanic principles as noted in pt instructions.   Baseline: New to OP OT Goal status: INITIAL  3.  Pt will be independent with scar tissue massage Baseline: New to OP OT Goal status: IN Progress  4.  Patient will demonstrate at least 25-30 lbs LUE grip strength as needed to open jars and other containers. Baseline: 11/12/24 Average Right 34.7 lbs; Left 20.5 lbs Goal status: IN Progress  5.  Pt will increase L wrist flexion and extension by at least 10 degrees each Baseline: wrist flex 50 degrees, wrist ext 10 degrees  Goal status: IN  Progress  6.  Pt will increase L radial deviation by at least 5 degrees  Baseline: 10 degrees  Goal status: IN Progress  LONG TERM GOALS: Target date: 01/27/25  Patient will demonstrate at least 16% improvement with quick Dash score (reporting 18.1% disability or less) indicating improved functional use of affected extremity.  Baseline: 34.1/100 Goal status: INITIAL  2.  Patient will demo improved FM coordination as evidenced by completing nine-hole peg with use of L hand in 35 seconds or less. Baseline: Right: 26.98 sec; Left: 40.85 sec Goal status: IN Progress  3.  Pt will be able to place at least 45 blocks using left hand with completion of Box and  Blocks test. Baseline: Right 59 blocks, Left 37 blocks Goal status: IN Progress  4.  Pt will increase L wrist flexion and extension by at least 20 degrees each Baseline: wrist flex 50 degrees, wrist ext 10 degrees  Goal status: INITIAL  5.  Patient will independently recall at least 3 energy conservation principles in relation to ADLs to increase functional independence.  Baseline: New to OP OT Goal status: INITIAL  6.  Pt will be independent with HEP targeting L wrist/hand ROM and strength  Baseline: New to OP OT Goal status: INITIAL  ASSESSMENT:  CLINICAL IMPRESSION: Patient is a 64 y.o. female who was seen today for occupational therapy treatment for s/p L CTR surgery on 10/25/24. Pt provided tendon glides and initial putty HEP for strengthening with good tolerance and participation in activities but caution provided re: not over doing her exercises. Pt will benefit from skilled OT services in the outpatient setting to work on impairments as noted below to help pt return to PLOF as able.    PERFORMANCE DEFICITS: in functional skills including ADLs, IADLs, coordination, dexterity, sensation, ROM, strength, pain, flexibility, decreased knowledge of precautions, decreased knowledge of use of DME, and UE functional use, and psychosocial skills including coping strategies and environmental adaptation.   IMPAIRMENTS: are limiting patient from ADLs, IADLs, and social participation.   COMORBIDITIES: may have co-morbidities  that affects occupational performance. Patient will benefit from skilled OT to address above impairments and improve overall function.  REHAB POTENTIAL: Good  PLAN:  OT FREQUENCY: 1x/week  OT DURATION: 8 weeks  PLANNED INTERVENTIONS: 97168 OT Re-evaluation, 97535 self care/ADL training, 02889 therapeutic exercise, 97530 therapeutic activity, 97035 ultrasound, 97018 paraffin, 02960 fluidotherapy, 97010 moist heat, 97010 cryotherapy, scar mobilization, passive range  of motion, energy conservation, coping strategies training, patient/family education, and DME and/or AE instructions  RECOMMENDED OTHER SERVICES: none  CONSULTED AND AGREED WITH PLAN OF CARE: Patient  PLAN FOR NEXT SESSION:  scar tissue massage by OT as tolerated with handout PROM as allowed Modalities once sutures removed Joint protection Check putty HEP and progress as appropriate   Clarita LITTIE Pride, OT 11/12/2024, 12:14 PM   "

## 2024-11-19 ENCOUNTER — Ambulatory Visit

## 2024-11-19 DIAGNOSIS — M6281 Muscle weakness (generalized): Secondary | ICD-10-CM

## 2024-11-19 DIAGNOSIS — M25632 Stiffness of left wrist, not elsewhere classified: Secondary | ICD-10-CM

## 2024-11-19 DIAGNOSIS — Z9889 Other specified postprocedural states: Secondary | ICD-10-CM

## 2024-11-19 DIAGNOSIS — L905 Scar conditions and fibrosis of skin: Secondary | ICD-10-CM

## 2024-11-19 DIAGNOSIS — M25532 Pain in left wrist: Secondary | ICD-10-CM

## 2024-11-19 DIAGNOSIS — R278 Other lack of coordination: Secondary | ICD-10-CM

## 2024-11-19 NOTE — Therapy (Signed)
 " OUTPATIENT OCCUPATIONAL THERAPY ORTHO TREATMENT  Patient Name: Toni Miller MRN: [id number] DOB:1961/03/07, 64 y.o., female Today's Date: 11/19/2024  PCP: Antonetta Rollene BRAVO, MD REFERRING PROVIDER: Arlinda Buster, MD  END OF SESSION:  OT End of Session - 11/19/24 1054     Visit Number 3    Number of Visits 9   including eval   Date for Recertification  01/27/25    Authorization Type [employer] Dual Complete    OT Start Time 1052    OT Stop Time 1145    OT Time Calculation (min) 53 min    Equipment Utilized During Treatment yellow putty    Activity Tolerance Patient tolerated treatment well    Behavior During Therapy WFL for tasks assessed/performed          Past Medical History:  Diagnosis Date   Anxiety    Arthritis    Cancer (HCC) 1993   abnormal pap treated at [facility]   Constipation    Depression    GERD (gastroesophageal reflux disease)    Glaucoma 1993   History of kidney stones    Hot flashes, menopausal 06/12/2011   Hyperlipidemia    Hypertension    no meds now   Neck pain 05/2008   WITH BULGING DISC-----RECIEVING EPIDURALS    Pinched nerve    back   Sinusitis    Past Surgical History:  Procedure Laterality Date   ABDOMINAL EXPLORATION SURGERY  age 54   bowel obstruction, [facility]   ABDOMINAL HYSTERECTOMY  2007   [facility], [facility]   BILATERAL EYE SURGERY     for glaucoma   BIOPSY  01/09/2019   Procedure: BIOPSY;  Surgeon: Harvey Margo CROME, MD;  Location: [facility];  Service: Endoscopy;;  gstric    bone removed from under tongue     BREAST BIOPSY Left    benign   BREAST CYST EXCISION Left 2004   BREAST SURGERY Left 2004   left partial mastectomy, [facility]   CARPAL TUNNEL RELEASE Left 10/25/2024   Procedure: LEFT WRIST ENDOSCOPIC CARPAL TUNNEL RELEASE;  Surgeon: Arlinda Buster, MD;  Location: [facility];  Service: Orthopedics;  Laterality: Left;   CATARACT EXTRACTION Right 06/04/2015   CATARACT EXTRACTION W/PHACO  05/16/2011   Procedure:  CATARACT EXTRACTION PHACO AND INTRAOCULAR LENS PLACEMENT (IOC);  Surgeon: Cherene Mania;  Location: [facility];  Service: Ophthalmology;  Laterality: Right;   COLONOSCOPY N/A 05/17/2013   Dr. Sharla diverticulosis was noted/small internal hemorrhoids   COLONOSCOPY WITH PROPOFOL  N/A 04/12/2021   Colonoscopy June 2022: small anal sphincter s/p rectal surgery, internal hemorrhoids, one 2 mm polyp in cecum (benign). 10 year surveillance.   CYST REMOVED LEFT BREAST /BENIGN  2005   left, [facility]   ESOPHAGOGASTRODUODENOSCOPY N/A 01/09/2019   normal esophagus. Mild NSAID gastritis s/p biopsy. +H.pylori. treated with amoxicillin , biaxin , Nexium .. ERADICATION DOCUMENTED   EYE SURGERY  2010, 1992 approx   bilateral   POLYPECTOMY  04/12/2021   Procedure: POLYPECTOMY;  Surgeon: Cindie Carlin POUR, DO;  Location: [facility];  Service: Endoscopy;;   REPAIR IMPERFORATE ANUS / ANORECTOPLASTY     per patient   TOTAL ABDOMINAL HYSTERECTOMY W/ BILATERAL SALPINGOOPHORECTOMY  2007   [facility], [facility]   TUBAL LIGATION  1991   Patient Active Problem List   Diagnosis Date Noted   Carpal tunnel syndrome, left upper limb 10/25/2024   Encounter for Medicare annual examination with abnormal findings 09/15/2024   Need for pneumococcal 20-valent conjugate vaccination 09/15/2024   Vitamin D   deficiency 09/15/2024   Elevated liver enzymes 04/27/2024   Hypokalemia 04/10/2024   Hyponatremia 04/10/2024   Elevated transaminase level 04/10/2024   Hyperbilirubinemia 04/10/2024   Back pain 03/26/2024   High serum vitamin B12 03/26/2024   Numbness and tingling in left hand 03/26/2024   GAD (generalized anxiety disorder) 01/14/2024   Other spondylosis with radiculopathy, cervical region 12/13/2023   Impingement syndrome of left shoulder 12/13/2023   Cough 09/12/2023   Immunization due 08/23/2023   Bloating 06/08/2023   Hot flashes due to menopause 04/26/2023   Positive self-administered antigen test for COVID-19 12/23/2022    Syncope and collapse 12/09/2022   Right knee pain 09/07/2021   Headache, variant migraine 07/07/2021   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   Prolapsed internal hemorrhoids, grade 3 06/03/2021   Mild intermittent asthma without complication 06/19/2020   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   Menopausal vaginal dryness 10/09/2019   Knee pain, left 04/26/2019   Chronic right hip pain 04/26/2019   Dyspepsia 12/12/2018   FH: breast cancer in first degree relative when <62 years old 06/06/2018   Neck pain 04/21/2016   Arthritis of both knees 04/25/2015   Osteopenia 04/02/2014   Depression, major, recurrent, in partial remission 01/06/2014   H/O abnormal Pap smear 04/20/2013   Thyroid  nodule 08/16/2011   Right thigh pain 06/01/2011   Constipation 06/01/2011   Low back pain with left-sided sciatica 12/29/2009   Headache(784.0) 09/26/2008   Hyperlipidemia with target LDL less than 100 01/29/2008   GLAUCOMA 01/29/2008   Hypertension 01/29/2008   GERD 01/29/2008    ONSET DATE: 10/01/2024 referral date, 10/25/24 CTR surgery  REFERRING DIAG: G56.02 (ICD-10-CM) - Carpal tunnel syndrome, left upper limb  IHP Pgs [phone]  THERAPY DIAG:  Muscle weakness (generalized)  Scar condition and fibrosis of skin  Other lack of coordination  Stiffness of left wrist, not elsewhere classified  Status post endoscopic carpal tunnel release  Pain in left wrist  Rationale for Evaluation and Treatment: Rehabilitation  SUBJECTIVE:   SUBJECTIVE STATEMENT: Pt reports successful completion of scar tissue massage and no concerns with putty HEP.  Pt accompanied by: self  PERTINENT HISTORY: Pt received CTR to LUE on 10/25/24, pt as of eval date is 4 days post surgery.  PRECAUTIONS: Other: hx of breast cancer, no electrical modalities  RED FLAGS: None   WEIGHT BEARING RESTRICTIONS: Yes LUE light activities only at this time  PAIN:  Are you having pain? Yes:  NPRS scale: 3/10 Pain location: L wrist  Pain description: aching (not sharp) Aggravating factors: strain Relieving factors: rest  FALLS: Has patient fallen in last 6 months? No  LIVING ENVIRONMENT: Lives with: lives with their son Lives in: House/apartment Stairs: No Has following equipment at home: Grab bars  PLOF: Independent  PATIENT GOALS: To reduce pain  NEXT MD VISIT: 11/06/24 - sutures removed; needs appt 1 month out  OBJECTIVE:  Note: Objective measures were completed at Evaluation unless otherwise noted.  HAND DOMINANCE: Right  ADLs: WFL  FUNCTIONAL OUTCOME MEASURES: Quick Dash: 34.1  UPPER EXTREMITY ROM:     Active ROM Right eval Left eval  Shoulder flexion    Shoulder abduction    Shoulder adduction    Shoulder extension    Shoulder internal rotation    Shoulder external rotation    Elbow flexion    Elbow extension    Wrist flexion  50  Wrist extension  10  Wrist ulnar deviation  10  Wrist radial deviation  Wrist pronation    Wrist supination    (Blank rows = not tested)  Active ROM Right eval Left eval  Thumb MCP (0-60)    Thumb IP (0-80)    Thumb Radial abd/add (0-55)     Thumb Palmar abd/add (0-45)     Thumb Opposition to Small Finger     Index MCP (0-90)     Index PIP (0-100)     Index DIP (0-70)      Long MCP (0-90)      Long PIP (0-100)      Long DIP (0-70)      Ring MCP (0-90)      Ring PIP (0-100)      Ring DIP (0-70)      Little MCP (0-90)      Little PIP (0-100)      Little DIP (0-70)      (Blank rows = not tested)   UPPER EXTREMITY MMT:     MMT Right eval Left eval  Shoulder flexion    Shoulder abduction    Shoulder adduction    Shoulder extension    Shoulder internal rotation    Shoulder external rotation    Middle trapezius    Lower trapezius    Elbow flexion    Elbow extension    Wrist flexion    Wrist extension    Wrist ulnar deviation    Wrist radial deviation    Wrist pronation    Wrist  supination    (Blank rows = not tested)  HAND FUNCTION: Grip strength: Right: 32.3 avg lbs; Left: NT lbs Right: (35.7, 28.2, 33)  11/13/23: Grip Strength Right: 38.1, 33.5, 32.6 lbs Left: 22.4, 20.5, 18.5 lbs Average Right 34.7 lbs; Left 20.5 lbs  COORDINATION: 9 Hole Peg test: Right: 26.98 sec; Left: 40.85 sec Box and Blocks:  Right 59 blocks, Left 37 blocks  SENSATION: Paresthesias in L hand  EDEMA: none  COGNITION: Overall cognitive status: Within functional limits for tasks assessed Areas of impairment: WNL  OBSERVATIONS: limited ROM, coordination, strength in L hand/wrist, pain, paresthesias   TODAY'S TREATMENT:                                                                                                                          - Ultrasound completed for duration as noted below including:  Ultrasound applied to palmar left wrist for 8 minutes, frequency of 3 MHz, 20% duty cycle, and 1.1 W/cm with pt's arm placed on soft towel for promotion of ROM, edema reduction, and pain reduction in affected extremity. Pt tolerated well. While applying ultrasound reviewed with pt desensitization techniques for reducing sensitivity to scar area.  - Therapeutic exercises completed for duration as noted below including:  Educated in IHP stretches for reducing pain, numbness, and tingling as well as improving ROM and circulation in L wrist. Provided visual demo and verbal instruction, min verbal cues required for proper form. See Pt instructions for handouts provided.  -  Therapeutic activities completed for duration as noted below including:  Educated pt in coordination HEP for improving FM coordination in L hand for carryover with ADLs and IADLs. Provided visual demo and verbal instruction for max carryover/understanding. Pt returned demo. See Pt instructions for handout provided. Pt reported 5/10 pain at end of session, encouraged to apply heat or ice once pt returned home; pt  agreed.  PATIENT EDUCATION: Education details: Putty exercises/activities; Tendon Glides Person educated: Patient Education method: Explanation, Demonstration, Tactile cues, Verbal cues, and Handouts Education comprehension: verbalized understanding, returned demonstration, verbal cues required, tactile cues required, and needs further education  HOME EXERCISE PROGRAM:  11/12/24:Tendon Glides; Putty HEP Access Code: [email or web address]   11/19/24: IHP Handout PGS. 15-16, coordination    GOALS: Goals reviewed with patient? Yes  SHORT TERM GOALS: Target date: 11/29/24  Patient will demonstrate updated LUE HEP with 25% verbal cues or less for proper execution. Baseline: New to OP OT Goal status: IN Progress  2.  Pt will independently recall at least 3 joint protection, ergonomics, and body mechanic principles as noted in pt instructions.   Baseline: New to OP OT Goal status: INITIAL  3.  Pt will be independent with scar tissue massage Baseline: New to OP OT Goal status: IN Progress  4.  Patient will demonstrate at least 25-30 lbs LUE grip strength as needed to open jars and other containers. Baseline: 11/12/24 Average Right 34.7 lbs; Left 20.5 lbs Goal status: IN Progress  5.  Pt will increase L wrist flexion and extension by at least 10 degrees each Baseline: wrist flex 50 degrees, wrist ext 10 degrees  Goal status: IN Progress  6.  Pt will increase L radial deviation by at least 5 degrees  Baseline: 10 degrees  Goal status: IN Progress  LONG TERM GOALS: Target date: 01/27/25  Patient will demonstrate at least 16% improvement with quick Dash score (reporting 18.1% disability or less) indicating improved functional use of affected extremity.  Baseline: 34.1/100 Goal status: INITIAL  2.  Patient will demo improved FM coordination as evidenced by completing nine-hole peg with use of L hand in 35 seconds or less. Baseline: Right: 26.98 sec; Left: 40.85 sec Goal status: IN Progress  3.   Pt will be able to place at least 45 blocks using left hand with completion of Box and Blocks test. Baseline: Right 59 blocks, Left 37 blocks Goal status: IN Progress  4.  Pt will increase L wrist flexion and extension by at least 20 degrees each Baseline: wrist flex 50 degrees, wrist ext 10 degrees  Goal status: INITIAL  5.  Patient will independently recall at least 3 energy conservation principles in relation to ADLs to increase functional independence.  Baseline: New to OP OT Goal status: INITIAL  6.  Pt will be independent with HEP targeting L wrist/hand ROM and strength  Baseline: New to OP OT Goal status: INITIAL  ASSESSMENT:  CLINICAL IMPRESSION: Patient is a 64 y.o. female who was seen today for occupational therapy treatment for s/p L CTR surgery on 10/25/24. Pt provided HEPS for improving ROM and coordination, returned demo with good dunerstanding, minimal cues required for proper form of ROM exercises, specifically exercise targeting radial/ulnar deviation. Pt will benefit from skilled OT services in the outpatient setting to work on impairments as noted below to help pt return to PLOF as able.    PERFORMANCE DEFICITS: in functional skills including ADLs, IADLs, coordination, dexterity, sensation, ROM, strength, pain, flexibility, decreased knowledge of precautions, decreased  knowledge of use of DME, and UE functional use, and psychosocial skills including coping strategies and environmental adaptation.   IMPAIRMENTS: are limiting patient from ADLs, IADLs, and social participation.   COMORBIDITIES: may have co-morbidities  that affects occupational performance. Patient will benefit from skilled OT to address above impairments and improve overall function.  REHAB POTENTIAL: Good  PLAN:  OT FREQUENCY: 1x/week  OT DURATION: 8 weeks  PLANNED INTERVENTIONS: 97168 OT Re-evaluation, 97535 self care/ADL training, 02889 therapeutic exercise, 97530 therapeutic activity, 97035  ultrasound, 97018 paraffin, 02960 fluidotherapy, 97010 moist heat, 97010 cryotherapy, scar mobilization, passive range of motion, energy conservation, coping strategies training, patient/family education, and DME and/or AE instructions  RECOMMENDED OTHER SERVICES: none  CONSULTED AND AGREED WITH PLAN OF CARE: Patient  PLAN FOR NEXT SESSION: Check STGs  Modalities (ultrasound, fluido) Joint protection Check putty HEP and progress as appropriate Check coordination/ROM HEP and progress as appropriate Light strengthening activities as tolerated    [employer], OT 11/19/2024, 11:54 AM   "

## 2024-11-19 NOTE — Patient Instructions (Addendum)
 SABRA

## 2024-11-26 ENCOUNTER — Ambulatory Visit

## 2024-11-28 ENCOUNTER — Ambulatory Visit: Admitting: Occupational Therapy

## 2024-12-03 ENCOUNTER — Ambulatory Visit

## 2024-12-10 ENCOUNTER — Ambulatory Visit

## 2024-12-12 ENCOUNTER — Telehealth: Payer: Self-pay | Admitting: Family Medicine

## 2024-12-12 NOTE — Telephone Encounter (Signed)
 Copied from CRM [id number]. Topic: General - Other >> Dec 12, 2024 10:53 AM Joesph NOVAK wrote: Reason for CRM: patient wants to let her pcp know she is now with [employer] company and they will take over her prescriptions by mailing them to her.

## 2024-12-17 ENCOUNTER — Ambulatory Visit

## 2024-12-24 ENCOUNTER — Ambulatory Visit

## 2024-12-31 ENCOUNTER — Ambulatory Visit: Admitting: Occupational Therapy

## 2025-02-11 ENCOUNTER — Ambulatory Visit: Admitting: Family Medicine

## 2025-10-29 ENCOUNTER — Ambulatory Visit: Payer: Self-pay
